# Patient Record
Sex: Male | Born: 1955 | Race: White | Hispanic: No | Marital: Married | Smoking: Never smoker
Health system: Southern US, Community
[De-identification: ages and names within clinical notes are randomized; demographics above are authoritative.]

## PROBLEM LIST (undated history)

## (undated) DIAGNOSIS — G4730 Sleep apnea, unspecified: Secondary | ICD-10-CM

## (undated) DIAGNOSIS — E785 Hyperlipidemia, unspecified: Secondary | ICD-10-CM

## (undated) DIAGNOSIS — I482 Chronic atrial fibrillation, unspecified: Secondary | ICD-10-CM

## (undated) DIAGNOSIS — G43909 Migraine, unspecified, not intractable, without status migrainosus: Secondary | ICD-10-CM

## (undated) DIAGNOSIS — Z86711 Personal history of pulmonary embolism: Secondary | ICD-10-CM

## (undated) DIAGNOSIS — F329 Major depressive disorder, single episode, unspecified: Secondary | ICD-10-CM

## (undated) DIAGNOSIS — F32A Depression, unspecified: Secondary | ICD-10-CM

## (undated) DIAGNOSIS — Z87442 Personal history of urinary calculi: Secondary | ICD-10-CM

## (undated) DIAGNOSIS — E669 Obesity, unspecified: Secondary | ICD-10-CM

## (undated) DIAGNOSIS — I10 Essential (primary) hypertension: Secondary | ICD-10-CM

## (undated) DIAGNOSIS — I5022 Chronic systolic (congestive) heart failure: Secondary | ICD-10-CM

## (undated) DIAGNOSIS — M1990 Unspecified osteoarthritis, unspecified site: Secondary | ICD-10-CM

## (undated) DIAGNOSIS — T7840XA Allergy, unspecified, initial encounter: Secondary | ICD-10-CM

## (undated) DIAGNOSIS — G4733 Obstructive sleep apnea (adult) (pediatric): Secondary | ICD-10-CM

## (undated) DIAGNOSIS — A770 Spotted fever due to Rickettsia rickettsii: Secondary | ICD-10-CM

## (undated) HISTORY — DX: Spotted fever due to Rickettsia rickettsii: A77.0

## (undated) HISTORY — DX: Essential (primary) hypertension: I10

## (undated) HISTORY — DX: Unspecified osteoarthritis, unspecified site: M19.90

## (undated) HISTORY — DX: Hyperlipidemia, unspecified: E78.5

## (undated) HISTORY — DX: Hemochromatosis, unspecified: E83.119

## (undated) HISTORY — DX: Allergy, unspecified, initial encounter: T78.40XA

## (undated) HISTORY — DX: Sleep apnea, unspecified: G47.30

## (undated) HISTORY — DX: Obstructive sleep apnea (adult) (pediatric): G47.33

## (undated) HISTORY — DX: Major depressive disorder, single episode, unspecified: F32.9

## (undated) HISTORY — PX: KNEE SURGERY: SHX244

## (undated) HISTORY — DX: Depression, unspecified: F32.A

## (undated) HISTORY — DX: Chronic systolic (congestive) heart failure: I50.22

## (undated) HISTORY — PX: URETEROSCOPY: SHX842

## (undated) HISTORY — DX: Migraine, unspecified, not intractable, without status migrainosus: G43.909

## (undated) HISTORY — DX: Obesity, unspecified: E66.9

## (undated) HISTORY — DX: Chronic atrial fibrillation, unspecified: I48.20

## (undated) HISTORY — DX: Personal history of pulmonary embolism: Z86.711

## (undated) HISTORY — DX: Personal history of urinary calculi: Z87.442

---

## 1999-04-02 ENCOUNTER — Emergency Department (HOSPITAL_COMMUNITY): Admission: EM | Admit: 1999-04-02 | Discharge: 1999-04-02 | Payer: Self-pay | Admitting: Emergency Medicine

## 1999-04-02 ENCOUNTER — Encounter: Payer: Self-pay | Admitting: Emergency Medicine

## 2007-04-11 ENCOUNTER — Emergency Department: Payer: Self-pay | Admitting: Emergency Medicine

## 2007-04-16 ENCOUNTER — Emergency Department: Payer: Self-pay | Admitting: Emergency Medicine

## 2007-08-09 ENCOUNTER — Ambulatory Visit: Payer: Self-pay | Admitting: Urology

## 2007-08-20 ENCOUNTER — Ambulatory Visit: Payer: Self-pay | Admitting: Urology

## 2007-08-22 ENCOUNTER — Ambulatory Visit: Payer: Self-pay | Admitting: Urology

## 2009-06-06 DIAGNOSIS — A770 Spotted fever due to Rickettsia rickettsii: Secondary | ICD-10-CM

## 2009-06-06 HISTORY — DX: Spotted fever due to Rickettsia rickettsii: A77.0

## 2009-10-03 ENCOUNTER — Observation Stay: Payer: Self-pay | Admitting: Specialist

## 2010-06-06 HISTORY — PX: CARDIAC CATHETERIZATION: SHX172

## 2010-10-12 ENCOUNTER — Inpatient Hospital Stay: Payer: Self-pay | Admitting: Internal Medicine

## 2010-12-30 ENCOUNTER — Emergency Department: Payer: Self-pay | Admitting: Emergency Medicine

## 2011-01-11 ENCOUNTER — Emergency Department: Payer: Self-pay | Admitting: Emergency Medicine

## 2011-03-02 ENCOUNTER — Emergency Department: Payer: Self-pay | Admitting: Unknown Physician Specialty

## 2011-06-09 ENCOUNTER — Emergency Department: Payer: Self-pay | Admitting: Emergency Medicine

## 2014-06-06 HISTORY — PX: URETERAL STENT PLACEMENT: SHX822

## 2014-07-24 ENCOUNTER — Emergency Department: Payer: Self-pay | Admitting: Emergency Medicine

## 2014-07-26 ENCOUNTER — Emergency Department: Payer: Self-pay | Admitting: Emergency Medicine

## 2014-07-30 ENCOUNTER — Ambulatory Visit: Payer: Self-pay | Admitting: Urology

## 2014-07-31 ENCOUNTER — Ambulatory Visit: Payer: Self-pay | Admitting: Urology

## 2014-07-31 HISTORY — PX: LITHOTRIPSY: SUR834

## 2014-08-21 DIAGNOSIS — I10 Essential (primary) hypertension: Secondary | ICD-10-CM | POA: Diagnosis not present

## 2014-08-28 ENCOUNTER — Ambulatory Visit: Payer: Self-pay | Admitting: Internal Medicine

## 2014-08-28 LAB — PROTIME-INR
INR: 1.1
Prothrombin Time: 14.1 secs

## 2014-08-29 ENCOUNTER — Other Ambulatory Visit: Payer: Self-pay | Admitting: Physician Assistant

## 2014-08-29 ENCOUNTER — Telehealth: Payer: Self-pay

## 2014-08-29 DIAGNOSIS — I482 Chronic atrial fibrillation: Secondary | ICD-10-CM | POA: Diagnosis not present

## 2014-08-29 DIAGNOSIS — I5022 Chronic systolic (congestive) heart failure: Secondary | ICD-10-CM | POA: Diagnosis not present

## 2014-08-29 LAB — URINALYSIS, COMPLETE
Bacteria: NONE SEEN
Bilirubin,UR: NEGATIVE
Blood: NEGATIVE
Glucose,UR: NEGATIVE mg/dL (ref 0–75)
Ketone: NEGATIVE
Leukocyte Esterase: NEGATIVE
Nitrite: NEGATIVE
Ph: 5 (ref 4.5–8.0)
Protein: NEGATIVE
RBC,UR: NONE SEEN /HPF (ref 0–5)
Specific Gravity: 1.011 (ref 1.003–1.030)
Squamous Epithelial: NONE SEEN
WBC UR: 2 /HPF (ref 0–5)

## 2014-08-29 LAB — CBC WITH DIFFERENTIAL/PLATELET
Basophil #: 0 10*3/uL (ref 0.0–0.1)
Basophil %: 0.6 %
Eosinophil #: 0.3 10*3/uL (ref 0.0–0.7)
Eosinophil %: 4.6 %
HCT: 44.1 % (ref 40.0–52.0)
HGB: 14.4 g/dL (ref 13.0–18.0)
Lymphocyte #: 2.5 10*3/uL (ref 1.0–3.6)
Lymphocyte %: 32.9 %
MCH: 32.5 pg (ref 26.0–34.0)
MCHC: 32.6 g/dL (ref 32.0–36.0)
MCV: 100 fL (ref 80–100)
Monocyte #: 0.6 x10 3/mm (ref 0.2–1.0)
Monocyte %: 8.3 %
Neutrophil #: 4 10*3/uL (ref 1.4–6.5)
Neutrophil %: 53.6 %
Platelet: 166 10*3/uL (ref 150–440)
RBC: 4.43 10*6/uL (ref 4.40–5.90)
RDW: 13 % (ref 11.5–14.5)
WBC: 7.4 10*3/uL (ref 3.8–10.6)

## 2014-08-29 LAB — BASIC METABOLIC PANEL
Anion Gap: 7 (ref 7–16)
BUN: 27 mg/dL — ABNORMAL HIGH
Calcium, Total: 8.8 mg/dL — ABNORMAL LOW
Chloride: 106 mmol/L
Co2: 27 mmol/L
Creatinine: 1.08 mg/dL
EGFR (African American): >60
EGFR (Non-African Amer.): >60
Glucose: 91 mg/dL
Potassium: 4.4 mmol/L
Sodium: 140 mmol/L

## 2014-08-29 NOTE — Telephone Encounter (Signed)
Patient contacted regarding discharge from [facility] on 08/29/14.  Patient understands to follow up with Dr. Rockey Situ on 09/12/14 at 8:15 at [facility]. Patient understands discharge instructions? yes Patient understands medications and regiment? yes Patient understands to bring all medications to this visit? yes

## 2014-09-03 ENCOUNTER — Telehealth: Payer: Self-pay

## 2014-09-03 NOTE — Telephone Encounter (Signed)
Pt states he was seen by Dr. Darnell Level in the hosp, states he went into afib during a procedure, for his kidney stone, and pt states that Dr. Darnell Level told him he would be present for his next procedure, and it is scheduled for Tuesday, 4/5. Also has a question regarding his Metoprolol.  Please call and advise. Pt has TCM appt on 4/8.  Also next call, was pt urologist, Dr. Yves Dill, letting us know the procedure is scheduled for 4/5 at 1:00. Their contact # [phone]

## 2014-09-03 NOTE — Telephone Encounter (Signed)
We need accurate list of his medications Also patient needs to give [place] his blood pressure and heart rate daily weekend, early next week so we can adjust medications  Uncertain if I can be there at that time, I am covering office as well as hospital  would like to premedicate him to avoid any problems prior to the procedure

## 2014-09-04 MED ORDER — PRAVASTATIN SODIUM 80 MG PO TABS: 80.0000 mg | ORAL_TABLET | Freq: Every evening | ORAL | Status: DC

## 2014-09-04 MED ORDER — CENTRUM SILVER ADULT 50+ PO TABS: 1.0000 | ORAL_TABLET | Freq: Every day | ORAL | Status: AC

## 2014-09-04 MED ORDER — TRAMADOL HCL 50 MG PO TABS: 50.0000 mg | ORAL_TABLET | Freq: Four times a day (QID) | ORAL | Status: DC | PRN

## 2014-09-04 MED ORDER — APIXABAN 5 MG PO TABS: 5.0000 mg | ORAL_TABLET | Freq: Two times a day (BID) | ORAL | Status: DC

## 2014-09-04 MED ORDER — METOPROLOL TARTRATE 100 MG PO TABS: 100.0000 mg | ORAL_TABLET | Freq: Two times a day (BID) | ORAL | Status: DC

## 2014-09-04 MED ORDER — VITAMIN C 500 MG PO TABS: 500.0000 mg | ORAL_TABLET | Freq: Every day | ORAL | Status: DC

## 2014-09-04 NOTE — Telephone Encounter (Signed)
Pt need clarification on medication. He is not sure the dosage he is to take. May have to rsch his kidney procedure.  Please call.

## 2014-09-04 NOTE — Telephone Encounter (Signed)
Spoke w/ pt.  Advised him of Dr. Donivan Scull recommendation.  Reviewed pt's med list w/ him and made adjustments according to his list.  Pt states repeatedly that he went over all of this w/ Thurmond Butts in the hospital.  Advised him that he has not been seen in our office and we need to reconcile his med list. Advised pt that Dr. Rockey Situ may not be present for the actual procedure, but would make adjustments to his meds. He states that Dr. Rockey Situ promised him that he would be there throughout the procedure and that Dr. Yves Dill was supposed to call and discuss this with him.  Pt states "I'm just going to cancel everything, I don't care if my kidney is ok or not.  I'll find a doctor that has time for me." and hung up on me.   Called Dr. Letta Kocher office and advised them of conversation w/ pt.  She states that she had expressed concern to pt, as she had never heard of a cardiologist being present for a procedure, but pt insisted to her that Dr. Rockey Situ and told him that. She is appreciative of the call and will make Dr. Yves Dill aware.  Gave her my direct # and asked her or Dr. Yves Dill to call back w/ any questions or concerns.

## 2014-09-04 NOTE — Telephone Encounter (Signed)
Pt called, and states he has stopped taking all his heart medication, because "no one can tell him what to take". And has cancelled his kidney procedure. Please call.

## 2014-09-04 NOTE — Telephone Encounter (Signed)
Stopped warfin and started eliquis his question is on: Metoprolol was on 100 mg twice a day and when he left hospital they sent a to his mail order pharmacy 50 mg 3 times a day, but in hospital they told him they would increase but they actually lowered it.  This is one problem  Spoke to dr Rockey Situ when he was in hospital he told him he would actually add the 50 to the 100. Since then he is still doing the 100, for it was sent to his mail order.  Also he may need a written rx to take to the local pharmacy.  Lasiks , he is not taking anymore and when he went to the hosptial 40 mg day used to take 2x a day He stated he will continue taking his normal dosage until he hears from [place]  [facility] in [place]   120/70 HR 79 about (1130 am this morning) 110/80 HR 69 About ( 3 pm today)

## 2014-09-05 MED ORDER — ALPRAZOLAM 0.5 MG PO TABS: 0.5000 mg | ORAL_TABLET | Freq: Once | ORAL | Status: DC | PRN

## 2014-09-05 NOTE — Telephone Encounter (Signed)
Would stay on metoprolol 100 mg twice a day The morning of procedure, Tuesday morning, take extra metoprolol 50 mg, Xanax 0.5 mg 1  If he has tachycardia in the preop area, take additional Xanax 0.5 mg 1 Would bring extra metoprolol with him if needed postop May need additional Xanax after the procedure if tachycardic with extra half dose metoprolol  Would let Dr. Eliberto Ivory know plan to make sure that preprocedure consent is signed ahead of time Patient can also take the Xanax after he arrives, after signing consent Metoprolol would take 150 mg in the morning when he gets up

## 2014-09-05 NOTE — Telephone Encounter (Signed)
Spoke w/ pt.  Advised him of Dr. Donivan Scull recommendation.  Pt verbalizes understanding and is agreeable.  He is appreciative of the call and will keep his appt next week.

## 2014-09-09 ENCOUNTER — Ambulatory Visit
Admit: 2014-09-09 | Disposition: A | Discharge status: Home / Self Care | Payer: Self-pay | Attending: Urology | Admitting: Urology

## 2014-09-12 ENCOUNTER — Encounter: Payer: Self-pay | Admitting: Cardiovascular Disease

## 2014-09-17 ENCOUNTER — Telehealth: Payer: Self-pay

## 2014-09-17 NOTE — Telephone Encounter (Signed)
Pt states he needs another kidney procedure and needs to talk with a nurse about his heart. Please call.

## 2014-09-17 NOTE — Telephone Encounter (Signed)
Pt has not been seen in our office yet.  He will need to keep his appt w/ Dr. Rockey Situ to discuss.

## 2014-09-19 NOTE — Telephone Encounter (Signed)
This encounter was created in error - please disregard.

## 2014-09-25 ENCOUNTER — Encounter: Payer: Self-pay | Admitting: Cardiovascular Disease

## 2014-09-26 ENCOUNTER — Encounter: Payer: Self-pay | Admitting: Cardiovascular Disease

## 2014-09-26 ENCOUNTER — Ambulatory Visit (INDEPENDENT_AMBULATORY_CARE_PROVIDER_SITE_OTHER): Payer: Medicare HMO | Admitting: Cardiovascular Disease

## 2014-09-26 VITALS — BP 100/80 | HR 105 | Ht 72.0 in | Wt 307.5 lb

## 2014-09-26 DIAGNOSIS — M170 Bilateral primary osteoarthritis of knee: Secondary | ICD-10-CM

## 2014-09-26 DIAGNOSIS — Z9989 Dependence on other enabling machines and devices: Secondary | ICD-10-CM

## 2014-09-26 DIAGNOSIS — F419 Anxiety disorder, unspecified: Secondary | ICD-10-CM | POA: Diagnosis not present

## 2014-09-26 DIAGNOSIS — R609 Edema, unspecified: Secondary | ICD-10-CM

## 2014-09-26 DIAGNOSIS — G4733 Obstructive sleep apnea (adult) (pediatric): Secondary | ICD-10-CM

## 2014-09-26 DIAGNOSIS — I4891 Unspecified atrial fibrillation: Secondary | ICD-10-CM | POA: Diagnosis not present

## 2014-09-26 DIAGNOSIS — N200 Calculus of kidney: Secondary | ICD-10-CM

## 2014-09-26 DIAGNOSIS — E785 Hyperlipidemia, unspecified: Secondary | ICD-10-CM | POA: Insufficient documentation

## 2014-09-26 DIAGNOSIS — I5032 Chronic diastolic (congestive) heart failure: Secondary | ICD-10-CM

## 2014-09-26 DIAGNOSIS — I10 Essential (primary) hypertension: Secondary | ICD-10-CM

## 2014-09-26 HISTORY — DX: Edema, unspecified: R60.9

## 2014-09-26 MED ORDER — ALPRAZOLAM 0.5 MG PO TABS: 0.5000 mg | ORAL_TABLET | Freq: Every evening | ORAL | Status: DC | PRN

## 2014-09-26 MED ORDER — APIXABAN 5 MG PO TABS: 5.0000 mg | ORAL_TABLET | Freq: Two times a day (BID) | ORAL | Status: DC

## 2014-09-26 NOTE — Assessment & Plan Note (Signed)
Chronic atrial fibrillation, rate borderline elevated Suggested he could try metoprolol succinate 125 mg twice a day For his procedure, lithotripsy, would take extra 50 mg the night before and morning of in addition to his Xanax the night before and morning of

## 2014-09-26 NOTE — Patient Instructions (Addendum)
You are doing well. Consider trying metoprolol 125 mg twice a day  For the procedure: Take xanax the night before and morning of the test (also one hour before the test if needed) Take metoprolol 150 mg the night before and morning of the procedure  Please call us if you have new issues that need to be addressed before your next appt.  Your physician wants you to follow-up in: 6 months.  You will receive a reminder letter in the mail two months in advance. If you don't receive a letter, please call our office to schedule the follow-up appointment.

## 2014-09-26 NOTE — Assessment & Plan Note (Signed)
Recommended that he stay on his pravastatin

## 2014-09-26 NOTE — Assessment & Plan Note (Signed)
I suspect his anxiety is playing a major role in his tachycardia before procedures. We'll continue to use Xanax prior to procedures

## 2014-09-26 NOTE — Progress Notes (Signed)
Carlos Crosby ID: Carlos Crosby, male    DOB: Sep 20, 1955, 59 y.o.   MRN: [id number]  HPI Comments: [name] is a 59 year old gentleman with morbid obesity, chronic atrial fibrillation, chronic systolic CHF with ejection fraction 25% in 2012, most recently 55% in September 2013, also history of obstructive sleep apnea on CPAP, hypertension, hyperlipidemia, recurrent renal stones, osteoarthritis of the knees bilaterally, depression who underwent lithotripsy on every 25th 2016 and presented for an outpatient left lithotripsy 08/28/2014 but was found to have atrial fibrillation with RVR with heart rates up to 170s prior to the procedure and this was canceled.  He was kept overnight in the hospital and heart rate returned to normal on his regular medication regiment, metoprolol succinate 100 mg twice a day He reports that he does not tolerate digoxin He was changed from warfarin to eliquis 5 g twice a day.  As an outpatient, he went repeat lithotripsy with Dr. Eliberto Ivory several weeks ago and was given Xanax the night before, morning of the procedure and extra metoprolol succinate 50 mg the night before is the morning of the procedure. He reports that his heart rate was well-controlled, Dr. Eliberto Ivory attempted the lithotripsy but was unsuccessful Lithotripsy will again be rescheduled for possibly next week on a Thursday, April 28 He currently has a stent in the left urethra  He reports that he feels well with no significant shortness of breath with exertion. Knees bother him chronically He takes Lasix 40 mg daily  EKG on today's visit shows atrial fibrillation with rate 10 5 bpm, left axis deviation   Allergies  Allergen Reactions  . Allopurinol Hives and Swelling  . Other Hives  . Penicillin G Nausea Only    And dirrhea  . Penicillins Other (See Comments)    Pt unsure of rxn; showed up on allergy test  . Ondansetron Rash    Outpatient Encounter Prescriptions as of 09/26/2014  Medication Sig  .  apixaban (ELIQUIS) 5 MG TABS tablet Take 1 tablet (5 mg total) by mouth 2 (two) times daily.  . furosemide (LASIX) 40 MG tablet Take 40 mg by mouth daily.  Marland Kitchen lisinopril (PRINIVIL,ZESTRIL) 5 MG tablet Take 5 mg by mouth daily.  . Meth-Hyo-M Bl-Na Phos-Ph Sal (URIBEL) 118 MG CAPS Take 118 mg by mouth 4 (four) times daily as needed.  . metoprolol (LOPRESSOR) 100 MG tablet Take 1 tablet (100 mg total) by mouth 2 (two) times daily.  . Multiple Vitamins-Minerals (CENTRUM SILVER ADULT 50+) TABS Take 1 tablet by mouth daily.  Marland Kitchen oxycodone (OXY-IR) 5 MG capsule Take 5 mg by mouth 3 (three) times daily as needed.  . pravastatin (PRAVACHOL) 80 MG tablet Take 1 tablet (80 mg total) by mouth every evening.  . traMADol (ULTRAM) 50 MG tablet Take 1 tablet (50 mg total) by mouth every 6 (six) hours as needed.  . vitamin C (ASCORBIC ACID) 500 MG tablet Take 1 tablet (500 mg total) by mouth daily.  . [DISCONTINUED] apixaban (ELIQUIS) 5 MG TABS tablet Take 1 tablet (5 mg total) by mouth 2 (two) times daily.  Marland Kitchen ALPRAZolam (XANAX) 0.5 MG tablet Take 1 tablet (0.5 mg total) by mouth at bedtime as needed for anxiety.  . [DISCONTINUED] ALPRAZolam (XANAX) 0.5 MG tablet Take 1 tablet (0.5 mg total) by mouth once as needed for anxiety (Please take 1 pill the morning of your procedure.Take 1 pill prn before procedure for anxiey). (Carlos Crosby not taking: Reported on 09/26/2014)    Past Medical History  Diagnosis Date  . Chronic a-fib   . Obesity   . OSA (obstructive sleep apnea)     on CPAP  . Chronic systolic CHF (congestive heart failure)   . Hypertension   . Hyperlipidemia   . History of kidney stones   . Osteoarthritis     bilateral knees  . Depression   . History of pulmonary embolism   . [place] spotted fever 2011    Past Surgical History  Procedure Laterality Date  . Lithotripsy  07/31/2014  . Knee surgery      right knee   . Ureteroscopy    . Ureteral stent placement  2016  . Cardiac  catheterization  2012    [facility]    Social History  reports that he has never smoked. He does not have any smokeless tobacco history on file. He reports that he does not drink alcohol or use illicit drugs.  Family History Family history is unknown by Carlos Crosby.   Review of Systems  Constitutional: Negative.   Respiratory: Negative.   Cardiovascular: Negative.   Gastrointestinal: Negative.   Musculoskeletal: Negative.   Skin: Negative.   Neurological: Negative.   Hematological: Negative.   Psychiatric/Behavioral: Negative.   All other systems reviewed and are negative.   BP 100/80 mmHg  Pulse 105  Ht 6' (1.829 m)  Wt 307 lb 8 oz (139.481 kg)  BMI 41.70 kg/m2  Physical Exam  Constitutional: He is oriented to person, place, and time. He appears well-developed and well-nourished.  Obese  HENT:  Head: Normocephalic.  Nose: Nose normal.  Mouth/Throat: Oropharynx is clear and moist.  Eyes: Conjunctivae are normal. Pupils are equal, round, and reactive to light.  Neck: Normal range of motion. Neck supple. No JVD present.  Cardiovascular: Normal rate, S1 normal, S2 normal, normal heart sounds and intact distal pulses.  An irregularly irregular rhythm present. Exam reveals no gallop and no friction rub.   No murmur heard. Pulmonary/Chest: Effort normal and breath sounds normal. No respiratory distress. He has no wheezes. He has no rales. He exhibits no tenderness.  Abdominal: Soft. Bowel sounds are normal. He exhibits no distension. There is no tenderness.  Musculoskeletal: Normal range of motion. He exhibits no edema or tenderness.  Lymphadenopathy:    He has no cervical adenopathy.  Neurological: He is alert and oriented to person, place, and time. Coordination normal.  Skin: Skin is warm and dry. No rash noted. No erythema.  Psychiatric: He has a normal mood and affect. His behavior is normal. Judgment and thought content normal.      Assessment and Plan   Nursing note and  vitals reviewed.

## 2014-09-26 NOTE — Assessment & Plan Note (Signed)
Scheduled for lithotripsy next week Regimen as above

## 2014-09-26 NOTE — Assessment & Plan Note (Signed)
He appears relatively euvolemic. Recommended he stay on his Lasix daily

## 2014-09-26 NOTE — Assessment & Plan Note (Signed)
Blood pressure is well controlled on today's visit. Medication changes as above for heart rate control

## 2014-09-26 NOTE — Assessment & Plan Note (Signed)
He reports that he is compliant on his Cpap

## 2014-09-26 NOTE — Assessment & Plan Note (Signed)
We have encouraged continued exercise, careful diet management in an effort to lose weight. He reports having 4 pound weight loss each month

## 2014-10-01 ENCOUNTER — Ambulatory Visit
Admit: 2014-10-01 | Disposition: A | Discharge status: Home / Self Care | Payer: Self-pay | Attending: Urology | Admitting: Urology

## 2014-10-01 ENCOUNTER — Encounter: Payer: Self-pay | Admitting: Cardiovascular Disease

## 2014-10-01 LAB — BASIC METABOLIC PANEL
Anion Gap: 11 (ref 7–16)
BUN: 28 mg/dL — ABNORMAL HIGH
Calcium, Total: 9.5 mg/dL
Chloride: 108 mmol/L
Co2: 22 mmol/L
Creatinine: 1.1 mg/dL
EGFR (African American): >60
EGFR (Non-African Amer.): >60
Glucose: 112 mg/dL — ABNORMAL HIGH
Potassium: 4.2 mmol/L
Sodium: 141 mmol/L

## 2014-10-01 LAB — PROTIME-INR
INR: 1
Prothrombin Time: 12.9 secs

## 2014-10-02 ENCOUNTER — Ambulatory Visit
Admit: 2014-10-02 | Disposition: A | Discharge status: Home / Self Care | Payer: Self-pay | Attending: Urology | Admitting: Urology

## 2014-10-03 ENCOUNTER — Encounter: Payer: Self-pay | Admitting: Cardiovascular Disease

## 2014-10-05 NOTE — H&P (Signed)
PATIENT NAME:  Carlos Crosby, Carlos Crosby MR#:  [id number] DATE OF BIRTH:  05-04-1956  DATE OF ADMISSION:  08/28/2014  ADDENDUM  I did the consult for this patient earlier today, and I am going to admit this patient for atrial fibrillation with RVR with some hypotension.. The patient's heart rate has been around 120s and blood pressure around 90s.   ASSESSMENT AND PLAN: The patient will be admitted under telemetry for atrial fibrillation with rapid ventricular response and borderline hypertension. The patient will get IV fluids, and we are going to restart his metoprolol and also start him on Lovenox full dose, and obtain cardiology consult. The rest of his physical hospital course are the same; please change my consult to a history and physical.   TIME SPENT: More than 30 minutes.   The patient will be seen by Dr. Rockey Situ. The patient is agreeable to stay. Continue Lovenox in case if he gets stabilized with heart rate, he may be seen by Dr. Yves Dill for possible stent placement for his left ureteral stone.   Please change my consult to a history and physical.   ____________________________ Epifanio Lesches, MD [id number] D: 08/28/2014 18:31:51 ET T: 08/28/2014 20:57:22 ET JOB#: [id number]  cc: Epifanio Lesches, MD, <Dictator> Epifanio Lesches MD ELECTRONICALLY SIGNED 09/07/2014 15:38

## 2014-10-05 NOTE — H&P (Signed)
PATIENT NAME:  Carlos Crosby, Carlos Crosby MR#:  [id number] DATE OF BIRTH:  09-Dec-1955  SAME DAY SURGERY: 08/28/2014    CHIEF COMPLAINT: Kidney stone.   HISTORY OF PRESENT ILLNESS: Carlos Crosby is a 60 year old white male with a large left renal stone, underwent lithotripsy February 25th. He was followed up in the office March 23rd and had an obstructing large fragment in the mid left ureter. He comes in now for left retrograde ureteroscopy with holmium laser lithotripsy and stent placement.   PAST MEDICAL HISTORY:   ALLERGIES: ALLOPURINOL, ZOFRAN AND PENICILLIN.   CURRENT MEDICATIONS: Include Lasix lisinopril, metoprolol, pravastatin, tamsulosin, tramadol, Coumadin, oxycodone and Nucynta.   PREVIOUS SURGICAL PROCEDURES: Included left percutaneous nephrolithotomy 2009 and knee surgery.   SOCIAL HISTORY: The patient denied tobacco or alcohol use.   FAMILY HISTORY: Negative for urological disease.   PAST AND CURRENT MEDICAL CONDITIONS:  1. Atrial fibrillation.  2. Arthritis.  3. Hypertension.  4. Hypercholesterolemia.  5. Recurrent calcium oxalate stone disease.   REVIEW OF SYSTEMS: The patient denies chest pain, shortness of breath, diabetes, stroke or coronary artery disease.   PHYSICAL EXAMINATION: GENERAL: Obese white male, in no acute distress.    HEENT: Sclerae were clear. Pupils were equally round, reactive to light and accommodation. Extraocular movements were intact.  NECK: Supple. No palpable cervical adenopathy.  LUNGS: Clear to auscultation.  CARDIOVASCULAR: Regular rhythm and rate, without audible murmurs.  ABDOMEN: Soft, nontender.  GENITOURINARY AND RECTAL: Deferred.  NEUROMUSCULAR: Alert and oriented x 3.   IMPRESSION: Left ureterolithiasis with high-grade obstruction.   PLAN: Cystoscopy with left retrograde, left ureteroscopy with holmium laser lithotripsy and stent placement.     ____________________________ Otelia Limes. Yves Dill, MD [email or web address] D: 08/27/2014 16:47:00  ET T: 08/27/2014 18:28:38 ET JOB#: [id number]  cc: Otelia Limes. Yves Dill, MD, <Dictator> Royston Cowper MD ELECTRONICALLY SIGNED 08/28/2014 13:55

## 2014-10-05 NOTE — Op Note (Signed)
PATIENT NAME:  Carlos Crosby, Carlos Crosby MR#:  [id number] DATE OF BIRTH:  December 22, 1955  DATE OF PROCEDURE:  09/09/2014  PREOPERATIVE DIAGNOSIS: Left ureterolithiasis.   POSTOPERATIVE DIAGNOSES:  1. Left ureterolithiasis.  2. Left nephrolithiasis.  3. Hydronephrosis.   PROCEDURES:  1. Left ureteroscopic ureterolithotomy with holmium laser lithotripsy.  2. Left double pigtail stent placement.  3. Left retrograde pyelogram.  4. Fluoroscopy.   SURGEON: Maryan Puls, MD.   ANESTHETIST: Dr. Boston Service.   ANESTHETIC METHOD: General per Dr. Boston Service.   INDICATIONS: See the dictated history and physical. After informed consent the patient requested the above procedure.   OPERATIVE SUMMARY: After adequate general anesthesia had been obtained the patient was placed into dorsal lithotomy position and the perineum was prepped and draped in the usual fashion. The 21 French cystoscope was coupled with the camera and then visually advanced into the bladder. The bladder was thoroughly inspected. No bladder lesions were identified. Both ureteral orifices were identified and had clear efflux. At this point an 42 [place] cone-tipped ureteral catheter was advanced into the left orifice and retrograde pyelograms were performed. Both fluoroscopic and static images were taken. The patient had an 8-10 mm stone in the region of the left mid ureter with proximal dilatation of the ureter. The patient also had a 10 mm stone present in the region of the left renal shadow. The kidney stone appeared to be localized in the left renal pelvis. At this point a 0.035 Glidewire was advanced into the left orifice and curled within the renal pelvis. There was some resistance at the site of the mid ureteral stone. The intramural ureter was then dilated to 7 mm with a balloon dilating catheter. The balloon dilating catheter was then removed, taking care to leave the stent in position. A ureteral access sheath was then advanced over the  guidewire and positioned in the distal ureter. The flexible ureteroscope was then advanced into the access sheath up to the point of the stone. The 386 micron holmium laser fiber was then advanced through the scope and the stone was fully fragmented. At this point the scope was easily passed into the renal pelvis. The ureteroscope was then removed and a 0.035 Glidewire was then placed back into the ureter. The cystoscope was back-loaded over the guidewire and a 6 x 26 cm double pigtail stent with suture attached distally advanced over the guidewire into the ureter. Guidewire was then removed, taking care to leave the stent in position. The cystoscope was then removed. 10 mL of viscous Xylocaine was instilled within the urethra and the bladder. A B and O suppository was placed. The procedure was then terminated and the patient was transferred to the recovery room in stable condition.    ____________________________ Otelia Limes. Yves Dill, MD [email or web address] D: 09/09/2014 14:38:59 ET T: 09/09/2014 14:54:46 ET JOB#: [id number]  cc: Otelia Limes. Yves Dill, MD, <Dictator> Royston Cowper MD ELECTRONICALLY SIGNED 09/10/2014 9:19

## 2014-10-05 NOTE — Discharge Summary (Signed)
PATIENT NAME:  Carlos Crosby, Carlos Crosby MR#:  [id number] DATE OF BIRTH:  [birth year]  DATE OF ADMISSION:  08/28/2014 DATE OF DISCHARGE:  08/29/2014  ADMITTING DIAGNOSES: Atrial fibrillation, rapid ventricular response.   DISCHARGE DIAGNOSES:   1.  Atrial fibrillation, rapid ventricular response.  2.  Hypotension due to a. fibrillation, resolved.  3.  Left ureteral stone.  4.  History of obstructive sleep apnea, kidney stones, obesity.   DISCHARGE CONDITION: Stable.   DISCHARGE MEDICATIONS: The patient is to continue Pravastatin 80 mg once daily, tramadol 50 mg daily as needed, naproxen 500 mg twice daily as needed, Centrum Silver once daily, vitamin C 500 mg daily, furosemide 40 mg p.o. half tablet once daily, potassium gluconate 1 tablet once daily, Tapentadol 50 mg every 4 to 6 hours as needed, tamsulosin 0.4 mg daily, promethazine 25 mg rectal suppository every 4 to 6 hours as needed, levofloxacin 500 mg p.o. once daily for prior recommended course. Metoprolol 50 mg tablets 3 tablets twice daily, this is a new dose. Eliquis 5 mg twice daily, this is new medication.   HOME OXYGEN: None.   DIET:  2 gram gram salt, low fat, low cholesterol, regular consistency.   ACTIVITY LIMITATIONS: As tolerated.    FOLLOWUP APPOINTMENT: With Dr. Rockey Situ in 1 week after discharge, Dr. Yves Dill, urology, in 1 week after discharge, and Dr. Petra Kuba in 2 days after discharge.   CONSULTANTS: Care management, social work, Dr. Rockey Situ, Mr. Christell Faith.   RADIOLOGIC STUDIES: None.   HOSPITAL COURSE: The patient is a 59 year old Caucasian male with history of atrial fibrillation who came for urologic procedure on the 08/28/2014, was sent to admission because of atrial fibrillation, RVR, when his heart rate was noted to be from 160s to 170s and he was noted to be hypotensive. Please refer to Dr. Governor Specking admission note on the 08/28/2014. On arrival to the hospital, the patient's vital signs: Temperature was 98.6, pulse was 160,  blood pressure 100/70, O2 saturations were 100% on 2 liters of oxygen per nasal cannula at rest. The patient's physical exam was remarkable for irregularly irregular, tachycardic rate and rhythm. Otherwise, physical exam was unremarkable. The patient's lab data done on arrival to the hospital showed mild elevation of BUN of 27, otherwise, unremarkable BMP. Liver enzymes were not checked. Cardiac enzymes were not checked. The patient's CBC was within normal limits with white blood cell count 7.4, hemoglobin 14.4, platelet count 166,000. Coagulation panel was unremarkable. Urinalysis revealed 2 white blood cells, no bacteria, no red blood cells, negative for nitrites or leukocyte esterase.  The patient was admitted to the hospital for further evaluation.   He was initiated on metoprolol higher doses and was given some IV fluids. With this, his condition improved. He was seen by cardiologist, Dr. Rockey Situ, as well as Mr. Christell Faith. Cardiologist recommended to advance his metoprolol to higher doses, also initiate patient on Eliquis for time being until decision is made to get urologic procedure done. The patient was started on advanced doses of metoprolol as well as Eliquis and was felt to be stable to be discharged home today, 08/29/2014.   On the day of discharge, temperature was 98.4, pulse was ranging from 77 to 103, respiration rate was 18 to 20, blood pressure 135/94, O2 saturations were 99% on 100% on room air at rest. The patient was advised to continue metoprolol as well as Eliquis and prescriptions were given for him. The patient is to follow up with Dr. Rockey Situ as well as  Dr. Petra Kuba for recommendations in regards to management of his atrial fibrillation. Coumadin was suspended to be restarted depending on patient's decision about continuation of Eliquis.    In regards to hypotension, the patient's low blood pressure resolved with improvement in atrial fibrillation rate with therapy. For ureteral stone,  patient is to follow up with Dr. Yves Dill for further recommendations. The patient is to discontinue Eliquis a few days prior to urologic evaluation,  procedure. For obstructive sleep apnea, the patient is to continue CPAP. The patient is being discharged in stable condition with the above-mentioned medications and followup.   TIME SPENT: 40 minutes.    ____________________________ Theodoro Grist, MD rv:AT D: 08/29/2014 17:55:45 ET T: 08/30/2014 03:28:27 ET JOB#: [id number]  cc: Theodoro Grist, MD, <Dictator> Hall Busing. Petra Kuba, MD Minna Merritts, MD Otelia Limes. Yves Dill, MD  Theodoro Grist MD ELECTRONICALLY SIGNED 09/11/2014 17:09

## 2014-10-05 NOTE — Consult Note (Signed)
PATIENT NAME:  Carlos Crosby, Carlos Crosby MR#:  [id number] DATE OF BIRTH:  Mar 24, 1956  DATE OF CONSULTATION:  08/28/2014  REFERRING PHYSICIAN:   CONSULTING PHYSICIAN:  Epifanio Lesches, MD  REQUESTING:   Dr. Yves Dill  REASON FOR CONSULTATION: Atrial fibrillation with RVR, rate up to 170 beats per minute.   HISTORY OF PRESENT ILLNESS: A 59 year old male patient came in to have outpatient procedure for left ureteral stone, with left ureteroscopy and stent placement by Dr. Yves Dill.  The patient has chronic atrial fibrillation, and takes metoprolol for that. The patient's cardiologist at [facility] and he takes metoprolol 100 mg twice a day and he took his morning this morning. During his preop eval, the patient found to have heart rate of 170 beats the operating room with atrial fibrillation with RVR.   the procedure was canceled and the patient was brought back to PACU.  The patient persistently had heart rate up to 150s-160s with atrial fibrillation with rapid ventricular rate. Concerning this, I was called in for stat consult.  The patient was seen immediately, the patient denied any chest pain, shortness of breath. Denied any dizziness. He was alert and oriented. He said that he is dehydrated; whenever he gets dehydrated his heart rate would go up.   The patient has no fluids since this morning. The patient's heart rate has been around 160s-170s with atrial fibrillation with RVR and blood pressure varying between 100/60s-90/60s, so we started the IV fluids, normal saline IV fluids, 1 liter bolus, patient also advised to drink plenty of fluids,. Regarding his heart rate, I called Dr. Esmond Plants and he suggested to give him metoprolol 25 mg p.o. 1 time, metoprolol 5 mg IV slow IV push and the patient is given 25 mg of metoprolol tartrate 1 tablet, IV push of 5 milligrams, 1 dose.   The patient's heart rate dropped nicely to 120s-130s; however, it was varying between 130s-140s at times. The patient really wants to go home and he  said he would like to go home and does not want to get admitted.   The same is the concern for his heart rate being so high and the risk of cardiac arrest is explained to the patient and also his wife. So we are going to monitor him in PACU for further symptoms of further atrial fibrillation with RVR.  If the heart rate goes down to like 110, he can go home and take his evening dose of metoprolol and see his cardiologist in the office at [facility]. I discussed this with Dr. Rockey Situ; he suggested the same if his heart rate is  controlled around 110, he can go home and see his cardiologist.   PAST MEDICAL HISTORY: Significant for chronic atrial fibrillation, sleep apnea, kidney stones.   HOME MEDICATIONS:   Include metoprolol tartrate 100 mg p.o. b.i.d.  Coumadin: The patient stopped Coumadin 5 days ago.   The patient has CPAP at home at night.   ALLERGIES: No known allergies.   PAST SURGICAL HISTORY: Significant for history of cardioversion 2 years ago and failed. The patient's cardioversion was done and failed. The patient follows up with [facility] and also EP physician.   FAMILY HISTORY: No hypertension or diabetes.   REVIEW OF SYSTEMS: CONSTITUTIONAL: Has no fever. No fatigue.  EYES: No blurred vision.  ENT: No tinnitus. No epistaxis. No difficulty swallowing.  RESPIRATIONS: No cough. No wheezing.  CARDIOVASCULAR: No chest pain. No orthopnea. No pedal edema.  GASTROINTESTINAL: No nausea. No vomiting. No abdominal  pain.  GENITOURINARY: No dysuria.  ENDOCRINE: No polyuria or nocturia.  HEMATOLOGIC: No anemia.  INTEGUMENTARY: No skin rashes.  MUSCULOSKELETAL: No joint pain.  NEUROLOGIC: No numbness or weakness.  PSYCHIATRIC: No anxiety or insomnia.   PHYSICAL EXAMINATION: VITAL SIGNS: Temperature 98.6, heart rate 160 beats per minute, blood pressure 100/70. The patient is an obese male, well developed, well nourished,  not in distress.  HEAD: Normocephalic, atraumatic.  EYES: Pupils equal,  reacting to light. No conjunctival pallor. No scleral icterus.  NOSE: No nasal lesions. No drainage.  EARS: No redness or external lesions.  MOUTH: No lesions.  Mucosa is clinically dry.  NECK: Supple and symmetric. No masses. Thyroid in the midline, not enlarged. No JVD.  RESPIRATORY:   Good respiratory effort.  Clear to auscultation. Chest symmetric rise and fall.     CARDIOVASCULAR: Irregularly irregular uncontrolled atrial fibrillation. No murmur.  The patient's pulses are variable because of atrial fibrillation.  No extremity edema.  GASTROINTESTINAL: Abdomen is soft, nontender, nondistended. Bowel sounds present.  MUSCULOSKELETAL: Extremities move x 4. No tenderness or effusion. Normal range of motion.  SKIN: Inspection is normal.  LYMPH: No lymphadenopathy.  PSYCHIATRIC: Mood and affect are within normal limits.  Oriented to time, place, person.  NEUROLOGIC: Alert, awake, oriented. No focal neurological deficit. Deep tendon reflexes 2+ bilaterally.  Bilateral upper and lower extremity strength is intact.     LABORATORY DATA: No laboratory data available except INR.   INR today is 1.1. EKG showed atrial fibrillation with RVR at 170 beats per minute.   The patient had labs today:  Electrolytes:  Sodium 137, potassium 4.3, chloride 106, bicarbonate 26, BUN 23, creatinine 1.38, glucose 84.   ASSESSMENT AND PLAN: 64. A 59 year old male with left ureteral stone comes in for ureteral stent and lithotripsy.  Had atrial fibrillation with rapid ventricular response, the  procedure was canceled.  He is in atrial fibrillation with rapid ventricular response with heart rate up to 170s. The patient is given metoprolol at 5 mg IV, 20 mg p.o.  Watch him for heart failure symptoms and heart rate and the patient can probably discharge home if the heart rate still is around 110 and take his metoprolol   p.o. 100 mg, metoprolol tartrate today evening and the same is discussed with the patient. I have discussed  this in detail with Dr. Rockey Situ and he also suggested that if the heart rate is controlled, if he is hemodynamically stable, the patient can be discharged from the PACU without need for admission.  The patient has his own cardiologist and EP study physician at [facility] that he can follow up with them as soon as he is discharged.  2. Regarding left ureteral stone, he needs to have stent and lithotripsy. when his heart rate is controlled and he is more stable.  3. Regarding atrial fibrillation, he can be started back on Coumadin.  4. Mild dehydration.   He received aggressive IV fluids and increased IV fluids.  Hold the Lasix and lisinopril today.  5. Benign prostatic hypertrophy. Continue Flomax.  6. Hyperlipoidemia. Continue pravastatin.   TIME SPENT ON THIS STAT CONSULT:  Is more than 55 minutes. This is a critical care consult.    ____________________________ Epifanio Lesches, MD sk:tr D: 08/28/2014 16:04:03 ET T: 08/28/2014 17:11:39 ET JOB#: [id number]  cc: Epifanio Lesches, MD, <Dictator> Epifanio Lesches MD ELECTRONICALLY SIGNED 09/07/2014 15:36

## 2014-10-05 NOTE — Consult Note (Signed)
General Aspect Primary Cardiologist: [facility] (Dr. Rica Records, MD) Primary Electrophysiologist: [facility] (Dr. Isaias Sakai, MD) Primary Urologist: Dr. Maryan Puls, MD PCP: Festus Aloe: (Dr. [name], MD) ____________  59 year old male with history of chronic a-fib on Coumadin, chronic systolic CHF with previous EF as low as 25% in 2012 most recent EF 55% in 02/2012, obesity, OSA on CPAP, HTN, HLD, recurrent history of renal stones, osteoarthritis of bilateral knees, and depression who recently underwent lithotripsy on 2/25 presented to [facility] on 08/28/2014 for outpatient left ureteral stone lithotripsy with left ureteroscopy and stent placement after being found to have obstructing stone on the office on 08/27/2014. Pre-op he was found to be in a-fib with RVR with HR in the 170s, asymptomatic. Cardiology is consulted for further evalaution.  _____________  PMH: 1. Chronic a-fib on Coumadin 2. Chronic systolic CHF with previous EF as low as 25% in 2012 most recent EF 55% in 02/2012 3. Obesity 4. OSA on CPAP 5. HTN 6. HLD 7. Recurrent history of renal stones 8. Osteoarthritis of bilateral knees 9. Depression ______________   Present Illness 59 year old male with the above problem list who presented to [facility] as an outpatient for the above procedure and was found to be in a-fib with RVR with HR into the 170s, he was asymptomatic. He was originally diagnosed with a-fib back in 2011. At that time he was having PAF, treated with rate control and anticoagulation. He was admitted to [facility] back in May 2011 for acute respiratory failure possibly due to extension of pulmonary embolism and/or worsening of pneumonia per record. 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 Echo at that time showed EF 25% with dilated LV/RV. Ultimately he was appartently transfered to [facility] for continuation of his medical care. Subsequent echos have shown improvement in his EF with echo in 02/2012 showing EF 55% with no  significant valvular disease. Echo in 2014 was a tachnically difficult study 2/2 a-fib with RVR, LV function was difficult to assess, though felt to be mildly reduced, trivial MR, dilated LA. He does report having had a cardiac cath previously, but I do not see record or mention of this in any of his notes. It was felt the decline in EF seen back 2012 was 2/2 tachycardia seen with his a-fib with RVR and nonischemic. He has undergone DCCV x 2 and was placed on Tikosyn s/p DCCV 01/2012. He held NSR for 11 months post DCCV, though he then developed NSVT (per chart) and this was discontinued 12/2012. Upon discontinuation of Tikosyn he went back into a-fib and has remained in a-fib since, making it chronic a-fib. He has been offered ablation, however he does not wish to proceed with this treatment. He remains rate controlled with Toprol XL 100 mg BID.   He has had a prior episode of developing a-fib with RVR preprocedure (colonoscopy).  This was felt to be 2/2 dehydration and delayed procedure. He went back home at that time, hydrated and took his medications with good results. He underwent his colonoscopy a short time later without issues. He does admit to getting worked up about procedures.   Upon his arrival to [facility] on 3/24 he was noted to be in a-fib with RVR pre-op with HR into the 170s. He was asymptomatic. He did take his Toprol XL 100 mg that morning. He did receive IV medication x 1 in the OR with slight improvement in HR per H and P. He  later received IV fluid bolus followed by IV Lopresor 5 mg x 1 along with Lopressor 25 mg po x 1 with continued improvement in HR. His procedure was postponed. HR improved to the 70s. He was admitted for observation. He remained stable and asymptomatic. He reported feeling anxious regarding the procedure. Labs were unremarkable. He is currently asymptomatic and up sitting in his bed.   Physical Exam:  GEN well developed, no acute distress, obese   HEENT PERRL, hearing  intact to voice, moist oral mucosa, poor dentition   NECK supple  trachea midline  no JVD   RESP normal resp effort  clear BS   CARD Regular rate and rhythm  Normal, S1, S2  No murmur   ABD denies tenderness  soft   LYMPH negative neck   EXTR negative edema   SKIN normal to palpation   NEURO cranial nerves intact   PSYCH alert   Review of Systems:  General: No Complaints   Skin: No Complaints   ENT: No Complaints   Eyes: No Complaints   Neck: No Complaints   Respiratory: No Complaints   Cardiovascular: No Complaints   Gastrointestinal: No Complaints   Genitourinary: No Complaints   Vascular: No Complaints   Musculoskeletal: No Complaints   Neurologic: No Complaints   Hematologic: No Complaints   Endocrine: No Complaints   Psychiatric: No Complaints   Review of Systems: All other systems were reviewed and found to be negative   Medications/Allergies Reviewed Medications/Allergies reviewed   Family & Social History:  Family and Social History:  Family History Negative  father: lung CA and HTN   Social History negative tobacco, negative ETOH, negative Illicit drugs   Place of Living Home  lives with his wife     Hyperlipidemia:    Sleep Apnea:    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:    Atrial Fibrillation:    Kidney Stones:    Gout:    Tonsillectomy:    right knee surgery x3:          Admit Diagnosis:   AFIB W RVR LEFT UU WITH HOLMIUM LASER R: Onset Date: 29-Aug-2014, Status: Active, Description: AFIB W RVR LEFT UU WITH HOLMIUM LASER R  Home Medications: Medication Instructions Status  tapentadol 50 mg oral tablet 1 tab(s) orally every 4 to 6 hours, As needed, severe pain (7-10/10) Active  tamsulosin 0.4 mg oral capsule 1 cap(s) orally once a day (after a meal) Active  promethazine 25 mg rectal suppository 1 suppository(ies) rectal every 4 to 6 hours, As needed, nausea, vomiting Active  levofloxacin 500 mg oral tablet 1 tab(s) orally every  24 hours Active  naproxen 500 mg oral tablet 1 tab(s) orally 2 times a day, As Needed Active  traMADol 50 mg oral tablet 1 tab(s) orally once a day, As Needed Active  Centrum Silver Therapeutic Multiple Vitamins with Minerals oral tablet 1 tab(s) orally once a day Active  Vitamin C 500 mg oral tablet 1 tab(s) orally once a day Active  potassium gluconate 1 tab(s) orally once a day Active  furosemide 40 mg oral tablet 0.5 tab(s) orally once a day Active  lisinopril 5 mg oral tablet 1 tab(s) orally once a day Active  metoprolol extended release 100 mg oral tablet, extended release 1 tab(s) orally 2 times a day Active  pravastatin 80 mg oral tablet 1 tab(s) orally once a day Active   Lab Results:  Routine Chem:  25-Mar-16 05:01   Glucose, Serum 91 (65-99 NOTE: New   Reference Range  08/12/14)  BUN  27 (6-20 NOTE: New Reference Range  08/12/14)  Creatinine (comp) 1.08 (0.61-1.24 NOTE: New Reference Range  08/12/14)  Sodium, Serum 140 (135-145 NOTE: New Reference Range  08/12/14)  Potassium, Serum 4.4 (3.5-5.1 NOTE: New Reference Range  08/12/14)  Chloride, Serum 106 (101-111 NOTE: New Reference Range  08/12/14)  CO2, Serum 27 (22-32 NOTE: New Reference Range  08/12/14)  Calcium (Total), Serum  8.8 (8.9-10.3 NOTE: New Reference Range  08/12/14)  Anion Gap 7  eGFR (African American) >60  eGFR (Non-African American) >60 (eGFR values <60mL/min/1.73 m2 may be an indication of chronic kidney disease (CKD). Calculated eGFR is useful in patients with stable renal function. The eGFR calculation will not be reliable in acutely ill patients when serum creatinine is changing rapidly. It is not useful in patients on dialysis. The eGFR calculation may not be applicable to patients at the low and high extremes of body sizes, pregnant women, and vegetarians.)  Routine Hem:  25-Mar-16 05:01   WBC (CBC) 7.4  RBC (CBC) 4.43  Hemoglobin (CBC) 14.4  Hematocrit (CBC) 44.1  Platelet Count  (CBC) 166  MCV 100  MCH 32.5  MCHC 32.6  RDW 13.0  Neutrophil % 53.6  Lymphocyte % 32.9  Monocyte % 8.3  Eosinophil % 4.6  Basophil % 0.6  Neutrophil # 4.0  Lymphocyte # 2.5  Monocyte # 0.6  Eosinophil # 0.3  Basophil # 0.0 (Result(s) reported on 29 Aug 2014 at 07:55AM.)   EKG:  EKG Interp. by me   Interpretation EKG shows a-fib, 167 bpm    Allopurinol: Rash  PCN: Unknown  Zofran ODT: Rash  Vital Signs/Nurse's Notes: **Vital Signs.:   25-Mar-16 05:22  Vital Signs Type Routine  Temperature Temperature (F) 97.4  Celsius 36.3  Temperature Source oral  Pulse Pulse 77  Pulse source if not from Vital Sign Device per cardiac monitor  Respirations Respirations 20  Systolic BP Systolic BP 106  Diastolic BP (mmHg) Diastolic BP (mmHg) 74  Mean BP 84  Pulse Ox % Pulse Ox % 98  Pulse Ox Activity Level  At rest  Oxygen Delivery Room Air/ 21 %    Impression 58 year old male with history of chronic a-fib on Coumadin, chronic systolic CHF with previous EF as low as 25% in 2012 most recent EF 55% in 02/2012, obesity, OSA on CPAP, HTN, HLD, recurrent history of renal stones, osteoarthritis of bilateral knees, and depression who recently underwent lithotripsy on 2/25 presented to [facility] on 08/28/2014 for outpatient left ureteral stone lithotripsy with left ureteroscopy and stent placement after being found to have obstructing stone on the office on 08/27/2014. Pre-op he was found to be in a-fib with RVR with HR in the 170s, asymptomatic.   1. A-fib with RVR: preop, possibly exacerbated by anxiety -Patient has chronic a-fib at baseline -HR has improved into the 70s overnight, would continue Toprol XL 100 mg bid (home dose) -He does get anxious prior to procedures, more so now that he has developed a-fib with RVR previously. Could consider pre-procedural antianxiety with extra beta blocker -Has been on Lovenox bridge 2/2 lithotripsy and ureteral stenting, he may do better with Eliquis 5 mg  bid (normal renal function no valvular pathology) -Start Eliquis 5 mg bid, he would need to hold 2 days prior to next procedure and restart 24 hours post procedure or when able per Urology -He requests cardiology to be involved to his pre-op care next time, we are happy   to assist with this  2. 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 Left ureteral stone: -Per urology  4. HTN: -Controlled -Continue current medications  5. OSA: -On CPAP  6. Obesity  7. HLD: -Pravastatin 80 mg   Electronic Signatures: Dunn, Ryan M (PA-C)  (Signed 25-Mar-16 10:48)  Authored: General Aspect/Present Illness, History and Physical Exam, Review of System, Family & Social History, Past Medical History, Home Medications, Labs, EKG , Allergies, Vital Signs/Nurse's Notes, Impression/Plan Gollan, Timothy (MD)  (Signed 25-Mar-16 19:52)  Authored: General Aspect/Present Illness, History and Physical Exam, Family & Social History, Health Issues, Home Medications, EKG , Impression/Plan  Co-Signer: General Aspect/Present Illness, History and Physical Exam, Review of System, Family & Social History, Past Medical History, Home Medications, Labs, EKG , Allergies, Vital Signs/Nurse's Notes, Impression/Plan   Last Updated: 25-Mar-16 19:52 by Gollan, Timothy (MD) 

## 2014-10-07 ENCOUNTER — Telehealth: Payer: Self-pay

## 2014-10-07 NOTE — Telephone Encounter (Signed)
Needs info for Eliquis so they can do tier exception,. Please call.

## 2014-10-09 NOTE — Telephone Encounter (Signed)
This encounter was created in error - please disregard.

## 2014-10-13 ENCOUNTER — Encounter: Payer: Self-pay | Admitting: Cardiovascular Disease

## 2014-10-16 NOTE — Telephone Encounter (Signed)
LMOM to call regarding prior authorization for Eliquis which has been approved until 06/06/2015 for the tier exception.

## 2014-10-17 ENCOUNTER — Telehealth: Payer: Self-pay | Admitting: *Deleted

## 2014-10-17 NOTE — Telephone Encounter (Signed)
Pt c/o medication issue:  1. Name of Medication: Metroprolol and Eloquis  2. How are you currently taking this medication (dosage and times per day)? Metroprolol 125 am 100pm and Eloquis 1 twice a day  3. Are you having a reaction (difficulty breathing--STAT)? No   4. What is your medication issue?  Patient feels extremely hot then goes extremely cold all over especially in hands. Is this a medication reaction. Went off the meds for a couple of days due to a kidney stone.

## 2014-10-17 NOTE — Telephone Encounter (Signed)
Not a usual side effect I am aware of Wonder if this could be from kidney stone or infection Less likely medication

## 2014-10-18 ENCOUNTER — Encounter: Payer: Self-pay | Admitting: Emergency Medicine

## 2014-10-18 ENCOUNTER — Emergency Department
Admission: EM | Admit: 2014-10-18 | Discharge: 2014-10-18 | Disposition: A | Discharge status: Home / Self Care | Payer: Medicare HMO | Attending: Emergency Medicine | Admitting: Emergency Medicine

## 2014-10-18 DIAGNOSIS — I10 Essential (primary) hypertension: Secondary | ICD-10-CM | POA: Diagnosis not present

## 2014-10-18 DIAGNOSIS — R300 Dysuria: Secondary | ICD-10-CM | POA: Insufficient documentation

## 2014-10-18 DIAGNOSIS — Z79899 Other long term (current) drug therapy: Secondary | ICD-10-CM | POA: Insufficient documentation

## 2014-10-18 DIAGNOSIS — R6883 Chills (without fever): Secondary | ICD-10-CM | POA: Diagnosis present

## 2014-10-18 LAB — URINALYSIS COMPLETE WITH MICROSCOPIC (ARMC ONLY)
Bilirubin Urine: NEGATIVE
Glucose, UA: NEGATIVE mg/dL
Ketones, ur: NEGATIVE mg/dL
Nitrite: NEGATIVE
Protein, ur: 100 mg/dL — AB
Specific Gravity, Urine: 1.01 (ref 1.005–1.030)
Squamous Epithelial / LPF: NONE SEEN
pH: 5 (ref 5.0–8.0)

## 2014-10-18 LAB — COMPREHENSIVE METABOLIC PANEL
ALT: 40 U/L (ref 17–63)
AST: 51 U/L — ABNORMAL HIGH (ref 15–41)
Albumin: 3.8 g/dL (ref 3.5–5.0)
Alkaline Phosphatase: 76 U/L (ref 38–126)
Anion gap: 10 (ref 5–15)
BUN: 19 mg/dL (ref 6–20)
CO2: 23 mmol/L (ref 22–32)
Calcium: 8.8 mg/dL — ABNORMAL LOW (ref 8.9–10.3)
Chloride: 102 mmol/L (ref 101–111)
Creatinine, Ser: 1.32 mg/dL — ABNORMAL HIGH (ref 0.61–1.24)
GFR calc Af Amer: >60 mL/min (ref >60)
GFR calc non Af Amer: 58 mL/min — ABNORMAL LOW (ref >60)
Glucose, Bld: 165 mg/dL — ABNORMAL HIGH (ref 65–99)
Potassium: 4.2 mmol/L (ref 3.5–5.1)
Sodium: 135 mmol/L (ref 135–145)
Total Bilirubin: 1.5 mg/dL — ABNORMAL HIGH (ref 0.3–1.2)
Total Protein: 7.2 g/dL (ref 6.5–8.1)

## 2014-10-18 LAB — CBC WITH DIFFERENTIAL/PLATELET
Basophils Absolute: 0.1 10*3/uL (ref 0–0.1)
Basophils Relative: 1 %
Eosinophils Absolute: 0 10*3/uL (ref 0–0.7)
Eosinophils Relative: 0 %
HCT: 43.1 % (ref 40.0–52.0)
Hemoglobin: 14.7 g/dL (ref 13.0–18.0)
Lymphocytes Relative: 12 %
Lymphs Abs: 1.7 10*3/uL (ref 1.0–3.6)
MCH: 33.5 pg (ref 26.0–34.0)
MCHC: 34.1 g/dL (ref 32.0–36.0)
MCV: 98.4 fL (ref 80.0–100.0)
Monocytes Absolute: 1.3 10*3/uL — ABNORMAL HIGH (ref 0.2–1.0)
Monocytes Relative: 9 %
Neutro Abs: 11.4 10*3/uL — ABNORMAL HIGH (ref 1.4–6.5)
Neutrophils Relative %: 78 %
Platelets: 195 10*3/uL (ref 150–440)
RBC: 4.37 MIL/uL — ABNORMAL LOW (ref 4.40–5.90)
RDW: 13.3 % (ref 11.5–14.5)
WBC: 14.5 10*3/uL — ABNORMAL HIGH (ref 3.8–10.6)

## 2014-10-18 MED ORDER — METOPROLOL TARTRATE 100 MG PO TABS
100.0000 mg | ORAL_TABLET | Freq: Once | ORAL | Status: AC
  Administered 2014-10-18: 100 mg via ORAL

## 2014-10-18 MED ORDER — DEXTROSE 5 % IV SOLN: 1.0000 g | Freq: Once | INTRAVENOUS | Status: DC

## 2014-10-18 MED ORDER — LEVOFLOXACIN 750 MG PO TABS: 750.0000 mg | ORAL_TABLET | Freq: Every day | ORAL | Status: AC

## 2014-10-18 MED ORDER — METOPROLOL TARTRATE 100 MG PO TABS
ORAL_TABLET | ORAL | Status: AC
  Administered 2014-10-18: 100 mg via ORAL
  Filled 2014-10-18: qty 1

## 2014-10-18 MED ORDER — SODIUM CHLORIDE 0.9 % IV SOLN: Freq: Once | INTRAVENOUS | Status: DC

## 2014-10-18 NOTE — ED Provider Notes (Signed)
[facility] Emergency Department Provider Note    ____________________________________________  Time seen: 0930  I have reviewed the triage vital signs and the nursing notes.   HISTORY  Chief Complaint Chills   History limited by: Not Limited   HPI Carlos Crosby is a 59 y.o. male who presents to the emergency department because of concerns for dysuria, chills. Patient had a ureteral stent placed roughly 2 months ago for kidney stones. He states he had been doing fine until a couple of days ago when he started noticing some discomfort on that left side. Initially he noticed burning with urination as well as discoloration of his urine. Denies any true fevers although he has had chills. Denies similar symptoms in the past.     Past Medical History  Diagnosis Date  . Chronic a-fib   . Obesity   . OSA (obstructive sleep apnea)     on CPAP  . Chronic systolic CHF (congestive heart failure)   . Hypertension   . Hyperlipidemia   . History of kidney stones   . Osteoarthritis     bilateral knees  . Depression   . History of pulmonary embolism   . [place] spotted fever 2011    Patient Active Problem List   Diagnosis Date Noted  . Atrial fibrillation, unspecified 09/26/2014  . Morbid obesity 09/26/2014  . Chronic diastolic congestive heart failure 09/26/2014  . Anxiety 09/26/2014  . Obstructive sleep apnea on CPAP 09/26/2014  . Essential hypertension 09/26/2014  . Hyperlipidemia 09/26/2014  . Arthritis, senescent 09/26/2014  . Kidney stone 09/26/2014    Past Surgical History  Procedure Laterality Date  . Lithotripsy  07/31/2014  . Knee surgery      right knee   . Ureteroscopy    . Ureteral stent placement  2016  . Cardiac catheterization  2012    [facility]    Current Outpatient Rx  Name  Route  Sig  Dispense  Refill  . ALPRAZolam (XANAX) 0.5 MG tablet   Oral   Take 1 tablet (0.5 mg total) by mouth at bedtime as needed for anxiety.   3  tablet   0   . apixaban (ELIQUIS) 5 MG TABS tablet   Oral   Take 1 tablet (5 mg total) by mouth 2 (two) times daily.   180 tablet   3   . furosemide (LASIX) 40 MG tablet   Oral   Take 40 mg by mouth daily.         Marland Kitchen levofloxacin (LEVAQUIN) 750 MG tablet   Oral   Take 1 tablet (750 mg total) by mouth daily.   5 tablet   0   . lisinopril (PRINIVIL,ZESTRIL) 5 MG tablet   Oral   Take 5 mg by mouth daily.         . Meth-Hyo-M Bl-Na Phos-Ph Sal (URIBEL) 118 MG CAPS   Oral   Take 118 mg by mouth 4 (four) times daily as needed.         . metoprolol (LOPRESSOR) 100 MG tablet   Oral   Take 1 tablet (100 mg total) by mouth 2 (two) times daily.   180 tablet   3   . Multiple Vitamins-Minerals (CENTRUM SILVER ADULT 50+) TABS   Oral   Take 1 tablet by mouth daily.         Marland Kitchen oxycodone (OXY-IR) 5 MG capsule   Oral   Take 5 mg by mouth 3 (three) times daily as needed.         Marland Kitchen  pravastatin (PRAVACHOL) 80 MG tablet   Oral   Take 1 tablet (80 mg total) by mouth every evening.   90 tablet   3   . traMADol (ULTRAM) 50 MG tablet   Oral   Take 1 tablet (50 mg total) by mouth every 6 (six) hours as needed.   30 tablet      . vitamin C (ASCORBIC ACID) 500 MG tablet   Oral   Take 1 tablet (500 mg total) by mouth daily.           Allergies Allopurinol; Other; Penicillin g; Penicillins; and Ondansetron  Family History  Problem Relation Age of Onset  . Family history unknown: Yes    Social History History  Substance Use Topics  . Smoking status: Never Smoker   . Smokeless tobacco: Not on file  . Alcohol Use: No    Review of Systems  Constitutional: Negative for fever. Cardiovascular: Negative for chest pain. Respiratory: Negative for shortness of breath. Gastrointestinal: Left sided pain Genitourinary: Positive for dysuria. Musculoskeletal: Negative for back pain. Skin: Negative for rash. Neurological: Negative for headaches, focal weakness or  numbness.   10-point ROS otherwise negative.  ____________________________________________   PHYSICAL EXAM:  VITAL SIGNS: ED Triage Vitals  Enc Vitals Group     BP 10/18/14 0619 113/86 mmHg     Pulse Rate 10/18/14 0619 64     Resp 10/18/14 0619 18     Temp 10/18/14 0619 98.5 F (36.9 C)     Temp src --      SpO2 10/18/14 0619 97 %     Weight 10/18/14 0620 300 lb (136.079 kg)     Height 10/18/14 0619 6' (1.829 m)     Head Cir --      Peak Flow --      Pain Score 10/18/14 0620 1   Constitutional: Alert and oriented. Well appearing and in no distress. Eyes: Conjunctivae are normal. PERRL. Normal extraocular movements. ENT   Head: Normocephalic and atraumatic.   Nose: No congestion/rhinnorhea.   Mouth/Throat: Mucous membranes are moist.   Neck: No stridor. Hematological/Lymphatic/Immunilogical: No cervical lymphadenopathy. Cardiovascular: Normal rate, regular rhythm.  No murmurs, rubs, or gallops. Respiratory: Normal respiratory effort without tachypnea nor retractions. Breath sounds are clear and equal bilaterally. No wheezes/rales/rhonchi. Gastrointestinal: Soft and mildly tender to left lower quadrant. No distention. There is no CVA tenderness. Genitourinary: Deferred Musculoskeletal: Normal range of motion in all extremities. No joint effusions.  No lower extremity tenderness nor edema. Neurologic:  Normal speech and language. No gross focal neurologic deficits are appreciated. Speech is normal.  Skin:  Skin is warm, dry and intact. No rash noted. Psychiatric: Mood and affect are normal. Speech and behavior are normal. Patient exhibits appropriate insight and judgment.  ____________________________________________    LABS (pertinent positives/negatives)  Labs Reviewed  CBC WITH DIFFERENTIAL/PLATELET - Abnormal; Notable for the following:    WBC 14.5 (*)    RBC 4.37 (*)    Neutro Abs 11.4 (*)    Monocytes Absolute 1.3 (*)    All other components within  normal limits  COMPREHENSIVE METABOLIC PANEL - Abnormal; Notable for the following:    Glucose, Bld 165 (*)    Creatinine, Ser 1.32 (*)    Calcium 8.8 (*)    AST 51 (*)    Total Bilirubin 1.5 (*)    GFR calc non Af Amer 58 (*)    All other components within normal limits  URINALYSIS COMPLETEWITH MICROSCOPIC [facility])  -  Abnormal; Notable for the following:    Color, Urine YELLOW (*)    APPearance CLOUDY (*)    Hgb urine dipstick 3+ (*)    Protein, ur 100 (*)    Leukocytes, UA 3+ (*)    Bacteria, UA RARE (*)    All other components within normal limits  URINE CULTURE  URINALYSIS, ROUTINE W REFLEX MICROSCOPIC     ____________________________________________   EKG  None  ____________________________________________    RADIOLOGY  None  ____________________________________________   PROCEDURES  Procedure(s) performed: None  Critical Care performed: No  ____________________________________________   INITIAL IMPRESSION / ASSESSMENT AND PLAN / ED COURSE  Pertinent labs & imaging results that were available during my care of the patient were reviewed by me and considered in my medical decision making (see chart for details).  Patient here with concerns for dysuria, left lower quadrant abdominal discomfort. ----------------------------------------- 9:50 AM on 10/18/2014 -----------------------------------------  Spoke with Dr. Yves Dill who recommended no antibiotics at this time. Stated that the urinalysis and leukocytosis findings are to be expected with a ureteral stent. Given lack of fever he would not treat for an infection at this time, additionally given clinic visit recently did not take any current imaging was necessary to check placement of stent. Patient does have prescription for Uribel and pain medications.  ____________________________________________   FINAL CLINICAL IMPRESSION(S) / ED DIAGNOSES  Final diagnoses:  Dysuria     Nance Pear,  MD 10/18/14 1311

## 2014-10-18 NOTE — ED Notes (Signed)
Pt in with co chills and dysuria. States has urinary discomfort and pain, had stent placed 2 months ago for kidney stones.

## 2014-10-18 NOTE — Discharge Instructions (Signed)
As we discussed do not start the antibiotics unless you develop a fever. Additionally please take the Uribel and the pain medications prescribed to by Dr. Yves Dill. Please seek medical attention for any high fevers, severe pain, persistent vomiting, bloody stools or any other new or concerning symptoms.  Ureteral Stent Implantation, Care After Refer to this sheet in the next few weeks. These instructions provide you with information on caring for yourself after your procedure. Your health care provider may also give you more specific instructions. Your treatment has been planned according to current medical practices, but problems sometimes occur. Call your health care provider if you have any problems or questions after your procedure. WHAT TO EXPECT AFTER THE PROCEDURE You should be back to normal activity within 48 hours after the procedure. Nausea and vomiting may occur and are commonly the result of anesthesia. It is common to experience sharp pain in the back or lower abdomen and penis with voiding. This is caused by movement of the ends of the stent with the act of urinating.It usually goes away within minutes after you have stopped urinating. HOME CARE INSTRUCTIONS Make sure to drink plenty of fluids. You may have small amounts of bleeding, causing your urine to be red. This is normal. Certain movements may trigger pain or a feeling that you need to urinate. You may be given medicines to prevent infection or bladder spasms. Be sure to take all medicines as directed. Only take over-the-counter or prescription medicines for pain, discomfort, or fever as directed by your health care provider. Do not take aspirin, as this can make bleeding worse. Your stent will be left in until the blockage is resolved. This may take 2 weeks or longer, depending on the reason for stent implantation. You may have an X-ray exam to make sure your ureter is open and that the stent has not moved out of position (migrated).  The stent can be removed by your health care provider in the office. Medicines may be given for comfort while the stent is being removed. Be sure to keep all follow-up appointments so your health care provider can check that you are healing properly. SEEK MEDICAL CARE IF:  You experience increasing pain.  Your pain medicine is not working. SEEK IMMEDIATE MEDICAL CARE IF:  Your urine is dark red or has blood clots.  You are leaking urine (incontinent).  You have a fever, chills, feeling sick to your stomach (nausea), or vomiting.  Your pain is not relieved by pain medicine.  The end of the stent comes out of the urethra.  You are unable to urinate. Document Released: 01/23/2013 Document Revised: 05/28/2013 Document Reviewed: 01/23/2013 [facility] Patient Information 2015 [place], [place]. This information is not intended to replace advice given to you by your health care provider. Make sure you discuss any questions you have with your health care provider.

## 2014-10-18 NOTE — ED Notes (Signed)
Painful urination, urinary frequency, hesitency, darkened color, chills, hx of urinary stent placement recently.

## 2014-10-20 NOTE — Telephone Encounter (Signed)
Left message on machine for patient to contact the office.   

## 2015-03-30 ENCOUNTER — Ambulatory Visit (INDEPENDENT_AMBULATORY_CARE_PROVIDER_SITE_OTHER): Payer: Medicare HMO | Admitting: Cardiovascular Disease

## 2015-03-30 ENCOUNTER — Encounter: Payer: Self-pay | Admitting: Cardiovascular Disease

## 2015-03-30 ENCOUNTER — Other Ambulatory Visit: Payer: Self-pay

## 2015-03-30 VITALS — BP 110/80 | HR 122 | Ht 72.0 in | Wt 325.5 lb

## 2015-03-30 DIAGNOSIS — E785 Hyperlipidemia, unspecified: Secondary | ICD-10-CM

## 2015-03-30 DIAGNOSIS — Z9989 Dependence on other enabling machines and devices: Secondary | ICD-10-CM

## 2015-03-30 DIAGNOSIS — I4891 Unspecified atrial fibrillation: Secondary | ICD-10-CM

## 2015-03-30 DIAGNOSIS — I10 Essential (primary) hypertension: Secondary | ICD-10-CM

## 2015-03-30 DIAGNOSIS — F419 Anxiety disorder, unspecified: Secondary | ICD-10-CM

## 2015-03-30 DIAGNOSIS — G4733 Obstructive sleep apnea (adult) (pediatric): Secondary | ICD-10-CM | POA: Diagnosis not present

## 2015-03-30 DIAGNOSIS — I5032 Chronic diastolic (congestive) heart failure: Secondary | ICD-10-CM

## 2015-03-30 MED ORDER — APIXABAN 5 MG PO TABS: 5.0000 mg | ORAL_TABLET | Freq: Two times a day (BID) | ORAL | Status: DC

## 2015-03-30 NOTE — Assessment & Plan Note (Signed)
We have encouraged  careful diet management in an effort to lose weight. Unable to exercise very much secondary to shortness of breath

## 2015-03-30 NOTE — Assessment & Plan Note (Signed)
Anxiety was previously an issue prior to his urologic procedure. Symptoms improved with Xanax, extra metoprolol

## 2015-03-30 NOTE — Patient Instructions (Addendum)
You are doing well.  Lasix and potassium as needed for leg swelling, SOB  Please increase the metoprolol up to 125 mg twice a day If rate continues to be elevated,  Increase the metoprolol up to 150 mg twice a day  Ask your wife about the side effect of digoxin  You have been referred to Dr. Caryl Comes for EP  Please call us if you have new issues that need to be addressed before your next appt.  Your physician wants you to follow-up in: 6 months.  You will receive a reminder letter in the mail two months in advance. If you don't receive a letter, please call our office to schedule the follow-up appointment.

## 2015-03-30 NOTE — Telephone Encounter (Signed)
Rx print for patient assistance for eliquis 5 mg

## 2015-03-30 NOTE — Assessment & Plan Note (Addendum)
Chronic atrial fibrillation, Rate difficult to control. Rate up to 120s at rest on today's visit, shortness of breath on exertion. Rate was a major issue prior to procedures in the past requiring high-dose metoprolol, Xanax ( likely a component of anxiety) Wife not with him today but he reports having a problem on digoxin in the past Blood pressure borderline low. We'll recommend he increase metoprolol up to 125 mill grams twice a day Suggested a referral with EP to determine if there are other medications we could use for rate control Potentially could stop lisinopril, take diltiazem 30 mg 3 times a day if tolerated. Extended release dose 120 mg would likely be too much

## 2015-03-30 NOTE — Assessment & Plan Note (Signed)
Encouraged him to stay on his pravastatin 

## 2015-03-30 NOTE — Progress Notes (Signed)
Patient ID: Carlos Crosby, male    DOB: [birth year], 59 y.o.   MRN: [id number]  HPI Comments: [name] is a 59 year old gentleman with morbid obesity, chronic atrial fibrillation (previously on tikosyn), chronic systolic CHF with ejection fraction 25% in 2012, most recently 55% in September 2013, also history of obstructive sleep apnea on CPAP, hypertension, hyperlipidemia, recurrent renal stones, osteoarthritis of the knees bilaterally, depression who underwent lithotripsy on every 25th 2016 and presented for an outpatient left lithotripsy 08/28/2014 but was found to have atrial fibrillation with RVR with heart rates up to 170s prior to the procedure and this was canceled. He was kept overnight in the hospital and heart rate returned to normal on his regular medication regiment, metoprolol succinate 100 mg twice a day He reports that he does not tolerate digoxin ( details unclear) He was changed from warfarin to eliquis 5 g twice a day.  In follow-up today, he reports that he feels fine Heart rate is elevated on EKG but he denies any symptoms His biggest complaint is chronic bilateral knee pain. He was told he might be too high risk for knee surgery Currently in the [place]. Requesting samples of eliquis. Recent kidney stone, weight increase as he stopped his high-protein diet Some shortness of breath with exertion, seems to come and go He takes Lasix as needed with potassium for leg edema  EKG on today's visit shows atrial fibrillation with ventricular rate 123 bpm, PVC  Other past medical history As an outpatient, he went repeat lithotripsy with Dr. Eliberto Crosby several weeks ago and was given Xanax the night before, morning of the procedure and extra metoprolol succinate 50 mg the night before is the morning of the procedure. He reports that his heart rate was well-controlled, Dr. Eliberto Crosby attempted the lithotripsy but was unsuccessful Lithotripsy repeated at a later date Recent removal of  stent in the left urethra    Allergies  Allergen Reactions  . Allopurinol Hives and Swelling  . Other Hives  . Penicillin G Nausea Only    And dirrhea  . Penicillins Other (See Comments)    Pt unsure of rxn; showed up on allergy test  . Ondansetron Rash    Outpatient Encounter Prescriptions as of 03/30/2015  Medication Sig  . ALPRAZolam (XANAX) 0.5 MG tablet Take 1 tablet (0.5 mg total) by mouth at bedtime as needed for anxiety.  Marland Kitchen apixaban (ELIQUIS) 5 MG TABS tablet Take 1 tablet (5 mg total) by mouth 2 (two) times daily.  Marland Kitchen lisinopril (PRINIVIL,ZESTRIL) 5 MG tablet Take 5 mg by mouth daily.  . metoprolol (LOPRESSOR) 100 MG tablet Take 1 tablet (100 mg total) by mouth 2 (two) times daily.  . Multiple Vitamins-Minerals (CENTRUM SILVER ADULT 50+) TABS Take 1 tablet by mouth daily.  . pravastatin (PRAVACHOL) 80 MG tablet Take 1 tablet (80 mg total) by mouth every evening.  . traMADol (ULTRAM) 50 MG tablet Take 1 tablet (50 mg total) by mouth every 6 (six) hours as needed.  . vitamin C (ASCORBIC ACID) 500 MG tablet Take 1 tablet (500 mg total) by mouth daily.  . [DISCONTINUED] furosemide (LASIX) 40 MG tablet Take 40 mg by mouth daily.  . [DISCONTINUED] Meth-Hyo-M Bl-Na Phos-Ph Sal (URIBEL) 118 MG CAPS Take 118 mg by mouth 4 (four) times daily as needed.  . [DISCONTINUED] oxycodone (OXY-IR) 5 MG capsule Take 5 mg by mouth 3 (three) times daily as needed.   No facility-administered encounter medications on file as of 03/30/2015.  Past Medical History  Diagnosis Date  . Chronic a-fib ([place])   . Obesity   . OSA (obstructive sleep apnea)     on CPAP  . Chronic systolic CHF (congestive heart failure) ([place])   . Hypertension   . Hyperlipidemia   . History of kidney stones   . Osteoarthritis     bilateral knees  . Depression   . History of pulmonary embolism   . Rocky Mountain spotted fever 2011    Past Surgical History  Procedure Laterality Date  . Lithotripsy  07/31/2014  .  Knee surgery      right knee   . Ureteroscopy    . Ureteral stent placement  2016  . Cardiac catheterization  2012    [facility]    Social History  reports that he has never smoked. He does not have any smokeless tobacco history on file. He reports that he does not drink alcohol or use illicit drugs.  Family History Family history is unknown by patient.   Review of Systems  Constitutional: Negative.   Respiratory: Negative.   Cardiovascular: Negative.   Gastrointestinal: Negative.   Musculoskeletal: Negative.   Skin: Negative.   Neurological: Negative.   Hematological: Negative.   Psychiatric/Behavioral: Negative.   All other systems reviewed and are negative.   BP 110/80 mmHg  Pulse 122  Ht 6' (1.829 m)  Wt 325 lb 8 oz (147.646 kg)  BMI 44.14 kg/m2  Physical Exam  Constitutional: He is oriented to person, place, and time. He appears well-developed and well-nourished.  Obese  HENT:  Head: Normocephalic.  Nose: Nose normal.  Mouth/Throat: Oropharynx is clear and moist.  Eyes: Conjunctivae are normal. Pupils are equal, round, and reactive to light.  Neck: Normal range of motion. Neck supple. No JVD present.  Cardiovascular: Normal rate, S1 normal, S2 normal, normal heart sounds and intact distal pulses.  An irregularly irregular rhythm present. Exam reveals no gallop and no friction rub.   No murmur heard. Pulmonary/Chest: Effort normal and breath sounds normal. No respiratory distress. He has no wheezes. He has no rales. He exhibits no tenderness.  Abdominal: Soft. Bowel sounds are normal. He exhibits no distension. There is no tenderness.  Musculoskeletal: Normal range of motion. He exhibits no edema or tenderness.  Lymphadenopathy:    He has no cervical adenopathy.  Neurological: He is alert and oriented to person, place, and time. Coordination normal.  Skin: Skin is warm and dry. No rash noted. No erythema.  Psychiatric: He has a normal mood and affect. His behavior is  normal. Judgment and thought content normal.      Assessment and Plan   Nursing note and vitals reviewed.

## 2015-03-30 NOTE — Assessment & Plan Note (Signed)
Recommended he use his Lasix with potassium as needed for leg edema, abdominal bloating, weight gain, shortness of breath

## 2015-03-30 NOTE — Assessment & Plan Note (Signed)
Reports he uses his CPAP

## 2015-03-30 NOTE — Assessment & Plan Note (Signed)
Blood pressure is well controlled on today's visit. No changes made to the medications. 

## 2015-04-07 ENCOUNTER — Other Ambulatory Visit: Payer: Self-pay

## 2015-04-09 ENCOUNTER — Ambulatory Visit: Payer: Medicare HMO | Admitting: Internal Medicine

## 2015-04-20 ENCOUNTER — Ambulatory Visit: Payer: Medicare HMO | Attending: *Deleted

## 2015-04-20 DIAGNOSIS — M25561 Pain in right knee: Secondary | ICD-10-CM | POA: Diagnosis not present

## 2015-04-20 DIAGNOSIS — M25562 Pain in left knee: Secondary | ICD-10-CM | POA: Insufficient documentation

## 2015-04-20 NOTE — Therapy (Signed)
[place] [facility] [address] [place], [place], [address] Phone: [phone]   Fax:  [phone]  Physical Therapy Evaluation  Patient Details  Name: Carlos Crosby MRN: [id number] Date of Birth: 03/13/1956 Referring Provider: Zada Girt  Encounter Date: 04/20/2015    Past Medical History  Diagnosis Date  . Chronic a-fib ([place])   . Obesity   . OSA (obstructive sleep apnea)     on CPAP  . Chronic systolic CHF (congestive heart failure) ([place])   . Hypertension   . Hyperlipidemia   . History of kidney stones   . Osteoarthritis     bilateral knees  . Depression   . History of pulmonary embolism   . [place] spotted fever 2011    Past Surgical History  Procedure Laterality Date  . Lithotripsy  07/31/2014  . Knee surgery      right knee   . Ureteroscopy    . Ureteral stent placement  2016  . Cardiac catheterization  2012    [facility]    There were no vitals filed for this visit.  Visit Diagnosis:  Arthralgia of both knees    PATIENT INFORMATION: Name: Carlos Crosby DOB: 12-28-1955                     Sex: M Date seen:  04/20/15      Time: 1025AM  Address: Physician: This evaluation/justification form will serve as the LMN for the following suppliers: __[employer]________________________ Supplier: Contact Person: Serafina Royals Phone: [phone]   Seating Therapist: Phone:   [phone]   Phone:    Spouse/Parent/Caregiver name:   Phone number:  Insurance/Payer:      Reason for Referral:  Patient/Caregiver Goals:  Patient was seen for face-to-face evaluation for new power wheelchair.  Also present was    Serafina Royals, [employer] to discuss recommendations and wheelchair options.  Further paperwork was completed and sent to vendor.  Patient appears to not qualify for power mobility device at this time per objective findings.   MEDICAL HISTORY: Diagnosis:                         Knee OA            Primary Diagnosis:  Onset: 5+  years ago Diagnosis:    []Progressive Disease Relevant past and future surgeries:  Lithotripsy x 3  Height:  64f 0 In Weight: 315 Explain recent changes or trends in weight:  none  History including Falls: pt reports severe knee OA, but is not a candidate for TKA. Pt reports he can get around for short distances and can stand for a short period of time, but his knees collapse resulting in near falls. Pt has had severe kidney stones and relevant procedures the past year.     HOME ENVIRONMENT: [x]House  []Condo/town home  []Apartment  []Assisted Living    []Lives Alone [x] Lives with Others                                                    Hours with caregiver: n/a  []Home is accessible to patient           Stairs      [x]Yes [] No     Ramp []Yes [x]No Comments:  4  stairs to get in the home with railing.    COMMUNITY ADL:  TRANSPORTATION: [x]Car    []Van    []Public Transportation    []Adapted w/c Lift    []Ambulance    []Other:       []Sits in wheelchair during transport  Employment/School:     Disability for knee OA Specific requirements pertaining to mobility                                                     Other:                                     FUNCTIONAL/SENSORY PROCESSING SKILLS:  Handedness:   [x]Right     []Left    []NA  Comments:                                 Functional Processing Skills for Wheeled Mobility [x]Processing Skills are adequate for safe wheelchair operation  Areas of concern than may interfere with safe operation of wheelchair Description of problem   [] Attention to environment      []Judgment      [] Hearing  [] Vision or visual processing      []Motor Planning  [] Fluctuations in Behavior                                                   VERBAL COMMUNICATION: [x]WFL receptive [x] WFL expressive [x]Understandable  []Difficult to understand  []non-communicative [] Uses an augmented communication device  CURRENT SEATING / MOBILITY: Current Mobility  Base:  [x]None []Dependent []Manual []Scooter []Power  Type of Control:                       Manufacturer:                         Size:                         Age:                           Current Condition of Mobility Base:                                                                                                                     Current Wheelchair components:  Describe posture in present seating system:                                                                            SENSATION and SKIN ISSUES: Sensation [x]Intact  []Impaired []Absent  Level of sensation:                         Pressure Relief: Able to perform effective pressure relief :    [x]Yes  [] No Method:                                                                              If not, Why?:                                                                          Skin Issues/Skin Integrity Current Skin Issues  []Yes [x]No []Intact [] Red area[] Open Area  []Scar Tissue []At risk from prolonged sitting Where                              History of Skin Issues  []Yes [x]No Where                                         When                                               Hx of skin flap surgeries  []Yes [x]No Where                                              When                                                  Limited sitting tolerance []Yes [x]No Hours spent sitting in wheelchair daily:  Complaint of Pain:  Please describe:   Pt reports 5/10 bilateral knee pain at rest                                                                                                           Swelling/Edema:    No lower leg edema. intermittent knee edema                                                                                                                                          ADL STATUS (in reference to wheelchair use):  Indep Assist Unable Indep with Equip Not assessed Comments  Dressing     x                                                           Modified. Difficulty donning/doffing shoes/socks, and underware        Eating        x                                                                                                                      Toileting       x  Bathing               x                                                     Supervision                                                                   Grooming/ Hygiene   x                                                                                                                           Meal Prep    x                                                                                                                      IADLS        x                                                                                                          Bowel Management: [x]Continent  []Incontinent  []Accidents Comments:                                                  Bladder Management: [x]Continent  []Incontinent  []Accidents Comments:  WHEELCHAIR SKILLS: Manual w/c Propulsion: []UE or LE strength and endurance sufficient to participate in ADLs using manual wheelchair Arm : []left []right   [x]Both      Distance: 175f with 72 arm strokes at 1.38 feet/sec. HR 64BPM at start. 56BPM following. Pt c/o R shoulder pain 5/10                                     Foot:  []left []right   []Both  Operate Scooter: [] Strength, hand grip, balance and transfer appropriate for use []Living environment is accessible for use of scooter  Operate Power w/c:  [] Std. Joystick   [] Alternative Controls Indep [] Assist []  Dependent/ unable [] N/A []  []Safe          [] Functional      Distance:               Bed confined without wheelchair [] Yes [x] No   STRENGTH/RANGE OF MOTION:  Range of Motion Strength  Shoulder                       WNL                                             L: 5/5, R 4+/5                                         Elbow                             WNL                                           5/5 bilaterally                                        Wrist/Hand                   WNL                                                      L grip 55lbs, R 68lbs                                                                Hip                  Hip flexion in sitting limited to 20deg, due to soft tissue approximation  R hip flexion 4+/5, knee extension 4+/5, hamstrings 4+/5, hip abduction 4/5, hip adduction 4/5. L hip flexion 4/5, knee extension 4/5, hamstrings 4+/5, hip abduction 4/5, hip adduction 4/5                                               Knee                   R: 0-90deg, L 3-85deg                                                 L   knee extension 4/5, hamstrings 4+/5,             R knee extension 4+/5, hamstrings 4+/5,     Isometric resistance painful                                         Ankle WFL              Ankle DF 4+/5 , PF 4/5                                                    MOBILITY/BALANCE:  [] Patient is totally dependent for mobility                                                                                               Balance Transfers Ambulation  Sitting Balance: Standing Balance: [] Independent [] Independent/Modified Independent  [x] WFL     [x] [facility] [x] Supervision [x] Supervision  [] Uses UE for balance  [] Supervision [] Min Assist [] Ambulates with Assist                           [] Min Assist [] Min assist [] Mod Assist [] Ambulates with Device:      [] RW  [] StW  [x] Kasandra Knudsen  []                 [] Mod Assist [] Mod assist [] Max assist   [] Max Assist [] Max assist [] Dependent [x] Indep. Short Distance Only  [] Unable [] Unable [] Lift / Sling Required Distance (in feet)        162f                     [] Sliding board [] Unable to Ambulate: (Explain:  Cardio Status:  [x]Intact  [] Impaired   [] NA  A-fib, HR measurements may be inaccurate                    Respiratory Status:  [x]Intact   []Impaired   []NA                                     Orthotics/Prosthetics:                                                                         Comments (Address manual vs power w/c vs scooter):  Pt ambulated using SPC x 137f with reports of increasing bilateral knee pain increasing from 5/10 to 9/10. O2 desaturated from 99% to 94%. HR increased from 90 to 135 BPM however HR measurement may be inaccurate due to A-Fib.    Pt requires stand by assist for safety. Pt had on intance where he felt his knees buckle, was able to self correct- needed CGA for safety                                            Anterior / Posterior Obliquity Rotation-Pelvis                               PELVIS    [x] [] []  Neutral Posterior Anterior  [x] [] []  WFL Rt elev Lt elev  [x] [] []  WFL Right Left             Anterior Anterior     [] Fixed [] Other [] Partly Flexible [x] Flexible   [] Fixed [] Other [] Partly Flexible  [x] Flexible  [] Fixed [] Other [] Partly Flexible  [x] Flexible   TRUNK  [] [x] []  [facility]  Lumbar  Kyphosis Lordosis  [x] [] []  WFL Convex Convex  Right Left []c-curve []s-curve                []multiple  [x] Neutral [] Left-anterior [] Right-anterior     [] Fixed [x] Flexible [] Partly Flexible  Other  [] Fixed [] Flexible [] Partly Flexible[] Other  [] Fixed            [] Flexible [] Partly Flexible [] Other    Position Windswept                   HIPS          [x]           []              [] Neutral       Abduct        ADduct         [x]           []           []  Neutral Right           Left      []  Fixed [] Subluxed [] Partly Flexible                [] Dislocated [x] Flexible  [] Fixed [] Other [] Partly Flexible  [] Flexible                 Foot Positioning Knee Positioning                              [x] WFL  []Lt []Rt [x] [facility]  []Lt []Rt    KNEES ROM concerns: ROM concerns:    & Dorsi-Flexed []Lt []Rt                                   FEET Plantar Flexed []Lt []Rt      Inversion                 []Lt []Rt      Eversion                 []Lt []Rt     HEAD [x] Functional [x] Good Head Control                     & [] Flexed         [] Extended [] Adequate Head Control    NECK [] Rotated  Lt  [] Lat Flexed Lt [] Rotated  Rt [] Lat Flexed Rt [] Limited Head Control     [] Cervical Hyperextension [] Absent  Head Control     SHOULDERS ELBOWS WRIST& HAND                                Left     Right    Left     Right    Left     Right   U/E [x]Functional           [x]Functional                                 []Fisting             []Fisting      []elev   []dep      []elev   []dep       [x]pro -[]retract     []pro  []retract []subluxed             []subluxed          Goals for Wheelchair Mobility  [x] Independence with mobility in the home with motor related ADLs (MRADLs)  [x] Independence with MRADLs in the community [] Provide dependent mobility  [] Provide recline     []Provide tilt   Goals for Seating system [] Optimize pressure distribution [x] Provide support needed to facilitate function or safety [] Provide corrective forces to assist with maintaining or improving posture [] Accommodate client's posture:   current seated postures and positions are not flexible or will not tolerate corrective forces [] Client to be independent with relieving pressure in the wheelchair []Enhance physiological function such as breathing, swallowing, digestion  Simulation ideas/Equipment trials:  State why other equipment was unsuccessful:                                                                         MOBILITY BASE RECOMMENDATIONS and JUSTIFICATION: MOBILITY COMPONENT JUSTIFICATION  Manufacturer:           Model:             Pride mobility             614HD Size: Width           Seat Depth             []provide transport from point A to B [x]promote Indep mobility  [x]is not a safe, functional ambulator [x]walker or cane inadequate []non-standard width/depth necessary to accommodate anatomical measurement []                            []Manual Mobility Base []non-functional ambulator    []Scooter/POV  []can safely operate  []can safely transfer   []has adequate trunk stability  []cannot functionally propel manual w/c  [x]Power Mobility Base  []non-ambulatory  [x]cannot functionally propel manual wheelchair (increased time, increased pain) [x] cannot functionally and safely operate scooter/POV(cannot transfer safely onto) []can safely operate and willing to  []Stroller Base []infant/child  []unable to propel manual wheelchair []allows for growth []non-functional ambulator []non-functional UE []Indep mobility is not a goal at this time  []Tilt  []Forward []Backward []Powered tilt  []Manual tilt  []change position against gravitational force on head and shoulders  []change position for pressure relief/cannot weight shift []transfers  []management of tone []rest periods []control edema []facilitate postural control  []                                      []Recline  []Power recline on power base []Manual recline on manual base  []accommodate femur to back angle  []bring to full recline for ADL care  []change position for pressure relief/cannot weight shift []rest periods []repositioning for transfers or clothing/diaper /catheter changes []head positioning  []Lighter weight required []self- propulsion   []lifting []                                                []Heavy Duty required []user weight greater than 250# []extreme tone/ over active movement []broken frame on previous chair []                                    [x] Back  [] Angle Adjustable [] Custom molded xCaptains chair                           []postural control []control of tone/spasticity []accommodation of range of motion []UE functional control [x]accommodation for seating system []                                         []  provide lateral trunk support []accommodate deformity []provide posterior trunk support [x]provide lumbar/sacral support []support trunk in midline []Pressure relief over spinal processes  [x] Seat Cushion X Captains chair                      []impaired sensation  []decubitus ulcers present []history of pressure ulceration []prevent pelvic extension []low maintenance  []stabilize pelvis  []accommodate obliquity []accommodate multiple deformity []neutralize lower extremity position [x]increase pressure distribution []                                          [] Pelvic/thigh support  [] Lateral thigh guide [] Distal medial pad  [] Distal lateral pad [] pelvis in neutral []accommodate pelvis [] position upper legs [] alignment [] accommodate ROM [] decrease adduction []accommodate tone []removable for transfers []decrease abduction  [] Lateral trunk Supports [] Lt     [] Rt []decrease lateral trunk leaning []control tone []contour for increased contact []safety  []accommodate asymmetry []                                                [] Mounting hardware  []lateral trunk supports  []back   []seat []headrest      [] thigh support []fixed   []swing away []attach seat platform/cushion to w/c frame []attach back cushion to w/c frame []mount postural supports []mount headrest  []swing medial thigh support away []swing lateral supports away for transfers  []                                                     Armrests  []fixed [x]adjustable height []removable   [x]swing away  []flip back   []reclining []full length pads []desk    []pads tubular  [x]provide support with elbow at 90   []provide support for w/c tray [x]change of height/angles for variable activities []remove for transfers []allow to come closer to table top []remove for access to tables []                                              Hangers/ Leg rests  []60 []70 []90 []elevating []heavy duty  []articulating []fixed []lift off []swing away     []power []provide LE support  []accommodate to hamstring tightness []elevate legs during recline   []provide change in position for Legs []Maintain placement of feet on footplate []durability []enable transfers []decrease edema []Accommodate lower leg length []                                        Foot support Footplate    []Lt  [] Rt  [] [place] mount []flip up     []depth/angle adjustable []Amputee adapter    [] Lt     [] Rt []provide foot support []accommodate to ankle ROM []transfers []Provide support for residual extremity []  allow foot to go under wheelchair base [] decrease tone  []                                                [] Ankle strap/heel loops []support foot on foot support []decrease extraneous movement []provide input to heel  []protect foot  Tires: []pneumatic  []flat free inserts  []solid  []decrease maintenance  []prevent frequent flats []increase shock absorbency []decrease pain from road shock []decrease spasms from road shock []                                             [] Headrest  []provide posterior head support []provide posterior neck support []provide lateral head support []provide anterior head support []support during tilt and recline []improve feeding   []improve respiration []placement of switches []safety  []accommodate ROM  []accommodate tone []improve visual orientation  [] Anterior chest strap []  Vest [] Shoulder retractors  []decrease forward movement of shoulder []accommodation of TLSO []decrease forward movement of trunk []decrease shoulder elevation []added abdominal support []alignment []assistance with shoulder control  []                                              Pelvic Positioner []Belt []SubASIS bar []Dual Pull []stabilize tone []decrease falling out of chair/ **will not Decrease potential for sliding due to pelvic tilting []prevent excessive rotation []pad for protection over boney prominence []prominence comfort []special pull angle to control rotation []                                                 Upper ExtremitySupport []L   [] R []Arm trough    []hand support [] tray       []full tray []swivel mount []decrease edema      []decrease subluxation   []control tone   []placement for AAC/Computer/EADL []decrease gravitational pull on shoulders []provide midline positioning []provide support to increase UE function []provide hand support in natural position []provide work surface   POWER WHEELCHAIR CONTROLS  [x]Proportional  []Non-Proportional Type                                      []Left  []Right [x]provides access for controlling wheelchair   []lacks motor control to operate proportional drive control []unable to understand proportional controls  Actuator Control Module  []Single  []Multiple   []Allow the client to operate the power seat function(s) through the joystick control   []Safety Reset Switches []Used to change modes and stop the wheelchair when driving in latch mode    []Upgraded Electronics   []programming for accurate control []progressive Disease/changing condition []non-proportional drive control needed []Needed in order to operate power seat functions through joystick control   []Display box []Allows user to see in which mode and drive the wheelchair is set  []necessary for alternate controls    []  Digital interface electronics  []Allows w/c to operate when using alternative drive controls  []ASL Head Array []Allows client to operate wheelchair  through switches placed in tri-panel headrest  []Sip and puff with tubing kit []needed to operate sip and puff drive controls  []Upgraded tracking electronics []increase safety when driving []correct tracking when on uneven surfaces  []Mount for switches or joystick []Attaches switches to w/c  []Swing away for access or transfers []midline for optimal placement []provides for consistent access  []Attendant controlled joystick plus mount []safety []long distance driving []operation of seat functions []compliance with transportation regulations []                                            Rear wheel placement/Axle adjustability []None []semi adjustable []fully adjustable  []improved UE access to wheels []improved stability []changing angle in space for improvement of postural stability []1-arm drive access []amputee pad placement []                               Wheel rims/ hand rims  []metal  []plastic coated []oblique projections []vertical projections []Provide ability to propel manual wheelchair  [] Increase self-propulsion with hand weakness/decreased grasp  Push handles []extended  []angle adjustable  []standard []caregiver access []caregiver assist []allows "hooking" to enable increased ability to perform ADLs or maintain balance  One armed device  []Lt   []Rt []enable propulsion of manual wheelchair with one arm   []                                           Brake/wheel lock extension [] Lt   [] Rt []increase indep in applying wheel locks   []Side guards []prevent clothing getting caught in wheel or becoming soiled [] prevent skin tears/abrasions  Battery:                                            [x]to power wheelchair                                                         Other:                                                                                                                         The above equipment has a life- long use expectancy. Growth and changes in medical and/or functional conditions would be the exceptions.  This is to certify that the therapist has no financial relationship with durable medical provider or manufacturer. The therapist will not receive remuneration of any kind for the equipment recommended in this evaluation.   Patient has mobility limitation that significantly impairs safe, timely participation in one or more mobility related ADL's.  (bathing, toileting, feeding, dressing, grooming, moving from room to room)                                                             [x] Yes [] No Will mobility device sufficiently improve ability to participate and/or be aided in participation of MRADL's?      [x] Yes [] No Can limitation be compensated for with use of a cane or walker?                                                                                [] Yes [x] No Does patient or caregiver demonstrate ability/potential ability & willingness to safely use the mobility device?    [x] Yes [] No Does patient's home environment support use of recommended mobility device?                                                    [] Yes [] No Does patient have sufficient upper extremity function necessary to functionally propel a manual wheelchair?     [x] Yes [] No Does patient have sufficient strength and trunk stability to safely operate a POV (scooter)?                                   [x] Yes [] No Does patient need additional features/benefits provided by a power wheelchair for MRADL's in the home?        [] Yes [x] No Does the patient demonstrate the ability to safely use a power wheelchair?                                                              [x] Yes [] No    Physician's Name Printed:                                                        73 Signature:  Date:     This is to certify that I, the above  signed therapist have the following affiliations: [] This DME provider [] Manufacturer  of recommended equipment [] Patient's long term care facility [x] None of the above  Therapist Name/Signature:            Caryl Pina C. Remona Boom, PT, DPT #[id number]                                 Date: 2015/04/26        [facility] PT Assessment - April 26, 2015 0001    Assessment   Medical Diagnosis Osteoarthritis of knee   Referring Provider [place]   Hand Dominance Right   Precautions   Precautions Fall   Restrictions   Weight Bearing Restrictions No   Balance Screen   Has the patient fallen in the past 6 months Yes   How many times? 1   Has the patient had a decrease in activity level because of a fear of falling?  Yes   Is the patient reluctant to leave their home because of a fear of falling?  Yes   [place] Private residence   Living Arrangements Spouse/significant other   Type of [place] to enter   Entrance Stairs-Number of Steps 4   Ambulation/Gait   Ambulation/Gait Yes   Ambulation/Gait Assistance 6: Modified independent (Device/Increase time)   Assistive device Straight cane                                PT Long Term Goals - 04/26/2015 1347    PT LONG TERM GOAL #1   Title pt will verbalize understanding of purpose for mobility evaluation    Time 1   Period Weeks   Status New               Plan - 26-Apr-2015 1346    Clinical Impression Statement See wheelchair evaluation   PT Frequency One time visit   PT Treatment/Interventions Wheelchair mobility training          G-Codes - 2015-04-26 1348    Functional Assessment Tool Used MMT, ROM, gait assessment, clinical judgement    Functional Limitation Mobility: Walking and moving around   Mobility: Walking and Moving Around Current Status [phone]) At least 40 percent but less than 60 percent impaired, limited or restricted   Mobility: Walking and Moving  Around Goal Status [phone]) At least 40 percent but less than 60 percent impaired, limited or restricted   Mobility: Walking and Moving Around Discharge Status [phone]) At least 40 percent but less than 60 percent impaired, limited or restricted       Problem List Patient Active Problem List   Diagnosis Date Noted  . Atrial fibrillation, unspecified 09/26/2014  . Morbid obesity ([place]) 09/26/2014  . Chronic diastolic congestive heart failure ([place]) 09/26/2014  . Anxiety 09/26/2014  . Obstructive sleep apnea on CPAP 09/26/2014  . Essential hypertension 09/26/2014  . Hyperlipidemia 09/26/2014  . Arthritis, senescent 09/26/2014  . Kidney stone 09/26/2014   Gorden Harms. Chao Blazejewski, PT, DPT [phone]  Julien Oscar 2015-04-26, 1:49 PM  [place] MAIN [facility] SERVICES [address] [place], [place], [address] Phone: [phone]   Fax:  [phone]  Name: JOSEDEJESUS MARCUM MRN: [id number] Date of Birth: 11/22/1955

## 2015-05-11 ENCOUNTER — Encounter: Payer: Self-pay | Admitting: Cardiovascular Disease

## 2015-05-12 ENCOUNTER — Telehealth: Payer: Self-pay

## 2015-05-12 NOTE — Telephone Encounter (Signed)
Tier exception for Eliquis 5mg  sent to [facility] ( for 2017)

## 2015-05-14 ENCOUNTER — Encounter: Payer: Self-pay | Admitting: Internal Medicine

## 2015-05-14 ENCOUNTER — Ambulatory Visit (INDEPENDENT_AMBULATORY_CARE_PROVIDER_SITE_OTHER): Payer: Medicare HMO | Admitting: Internal Medicine

## 2015-05-14 VITALS — BP 134/78 | HR 134 | Ht 71.75 in | Wt 331.8 lb

## 2015-05-14 DIAGNOSIS — I4891 Unspecified atrial fibrillation: Secondary | ICD-10-CM | POA: Diagnosis not present

## 2015-05-14 LAB — TSH: TSH: 2.161 u[IU]/mL (ref 0.350–4.500)

## 2015-05-14 MED ORDER — DIGOXIN 250 MCG PO TABS: 0.2500 mg | ORAL_TABLET | Freq: Every day | ORAL | Status: DC

## 2015-05-14 NOTE — Patient Instructions (Addendum)
Medication Instructions: 1) Start digoxin (lanoxin) 0.25 mg one tablet by mouth once daily  Labwork: - Your physician recommends that you have lab work today: TSH  Procedures/Testing: - Your physician has recommended that you wear a 24 hour holter monitor- in 1 week [employer]). Holter monitors are medical devices that record the heart's electrical activity. Doctors most often use these monitors to diagnose arrhythmias. Arrhythmias are problems with the speed or rhythm of the heartbeat. The monitor is a small, portable device. You can wear one while you do your normal daily activities. This is usually used to diagnose what is causing palpitations/syncope (passing out).  - Your physician has requested that you have an echocardiogram- in 2 weeks [facility]) chocardiography is a painless test that uses sound waves to create images of your heart. It provides your doctor with information about the size and shape of your heart and how well your heart's chambers and valves are working. This procedure takes approximately one hour. There are no restrictions for this procedure.  Follow-Up: - Pending the results of your test.  Any Additional Special Instructions Will Be Listed Below (If Applicable). - none

## 2015-05-14 NOTE — Progress Notes (Signed)
ELECTROPHYSIOLOGY CONSULT NOTE  Patient ID: Carlos Crosby, MRN: [id number], DOB/AGE: 01/31/1956 59 y.o. Admit date: (Not on file) Date of Consult: 05/14/2015  Primary Physician: [name], MD Primary Cardiologist: TG  Chief Complaint: Atrial Fibrillation with RVR   HPI Carlos Crosby is a 59 y.o. male  With morbidly obese and persistent atrial fibrillation dating back to   2014  Echo at that time normal LV function  he was cardioverted on one occasion remotely requiring 2 defibrillators. There've been no efforts to restore sinus rhythm since 2014. Following reversion to atrial fibrillation discussions were held at [facility] regarding catheter ablation; he declined.   This strategy has been rate control and this is been increasingly problematic of late.   He is referred because of persistently rapid rates.  He has significant exercise intolerance largely associated with his knees. He is able to walk in a pool. He unfortunately does not have easy access to a pool.  He has untreated sleep apnea.  He has moderate peripheral edema    His past medical history in addition to the above is complicated as outlined below.  Review of old records suggests that there was a resolution of his significant cardiomyopathy a few years ago  Past Medical History  Diagnosis Date  . Chronic a-fib ([place])   . Obesity   . OSA (obstructive sleep apnea)     on CPAP  . Chronic systolic CHF (congestive heart failure) ([place])   . Hypertension   . Hyperlipidemia   . History of kidney stones   . Osteoarthritis     bilateral knees  . Depression   . History of pulmonary embolism   . Rocky Mountain spotted fever 2011      Surgical History:  Past Surgical History  Procedure Laterality Date  . Lithotripsy  07/31/2014  . Knee surgery      right knee   . Ureteroscopy    . Ureteral stent placement  2016  . Cardiac catheterization  2012    [facility]     Home Meds: Prior to Admission medications     Medication Sig Start Date End Date Taking? Authorizing Provider  ALPRAZolam Duanne Moron) 0.5 MG tablet Take 1 tablet (0.5 mg total) by mouth at bedtime as needed for anxiety. 09/26/14  Yes Minna Merritts, MD  apixaban (ELIQUIS) 5 MG TABS tablet Take 1 tablet (5 mg total) by mouth 2 (two) times daily. 03/30/15  Yes Minna Merritts, MD  lisinopril (PRINIVIL,ZESTRIL) 5 MG tablet Take 5 mg by mouth daily.   Yes Historical Provider, MD  metoprolol (LOPRESSOR) 100 MG tablet Take 100 mg by mouth 2 (two) times daily.   Yes Historical Provider, MD  metoprolol succinate (TOPROL-XL) 50 MG 24 hr tablet Take 25 mg by mouth 2 (two) times daily. Take with or immediately following a meal.   Yes Historical Provider, MD  Multiple Vitamins-Minerals (CENTRUM SILVER ADULT 50+) TABS Take 1 tablet by mouth daily. 09/04/14  Yes Historical Provider, MD  pravastatin (PRAVACHOL) 80 MG tablet Take 1 tablet (80 mg total) by mouth every evening. 09/04/14  Yes Historical Provider, MD  traMADol (ULTRAM) 50 MG tablet Take 1 tablet (50 mg total) by mouth every 6 (six) hours as needed. 09/04/14  Yes Historical Provider, MD  vitamin C (ASCORBIC ACID) 500 MG tablet Take 1 tablet (500 mg total) by mouth daily. 09/04/14  Yes Historical Provider, MD    Allergies:  Allergies  Allergen Reactions  . Allopurinol  Hives and Swelling  . Other Hives  . Penicillin G Nausea Only    And dirrhea  . Penicillins Other (See Comments)    Pt unsure of rxn; showed up on allergy test  . Ondansetron Rash    Social History   Social History  . Marital Status: Married    Spouse Name: N/A  . Number of Children: N/A  . Years of Education: N/A   Occupational History  . Not on file.   Social History Main Topics  . Smoking status: Never Smoker   . Smokeless tobacco: Not on file  . Alcohol Use: No  . Drug Use: No  . Sexual Activity: Not on file   Other Topics Concern  . Not on file   Social History Narrative     Family History  Problem  Relation Age of Onset  . Family history unknown: Yes     ROS:  Please see the history of present illness.     All other systems reviewed and negative.    Physical Exam:   Blood pressure 134/78, pulse 134, height 5' 11.75" (1.822 m), weight 331 lb 12.8 oz (150.503 kg). General: Well developed, Morbidly obese   male in no acute distress. Head: Normocephalic, atraumatic, sclera non-icteric, no xanthomas, nares are without discharge. EENT: normal  Lymph Nodes:  none Neck: Negative for carotid bruits. JVP 8-10 cm Back:without scoliosis kyphosis*  Lungs: Clear bilaterally to auscultation without wheezes, rales, or rhonchi. Breathing is unlabored. Heart: Irregularly irregular rhythm murmur . No rubs, or gallops appreciated. Abdomen: Soft, non-tender, non-distended with normoactive bowel sounds. No hepatomegaly. No rebound/guarding. No obvious abdominal masses. Msk:  Strength and tone appear normal for age. Extremities: No clubbing or cyanosis. 2+* edema.  Distal pedal pulses are 2+ and equal bilaterally. Skin: Warm and Dry Neuro: Alert and oriented X 3. CN III-XII intact Grossly normal sensory and motor function . Psych:  Responds to questions appropriately with a normal affect.      Labs: Cardiac Enzymes No results for input(s): CKTOTAL, CKMB, TROPONINI in the last 72 hours. CBC Lab Results  Component Value Date   WBC 14.5* 10/18/2014   HGB 14.7 10/18/2014   HCT 43.1 10/18/2014   MCV 98.4 10/18/2014   PLT 195 10/18/2014   PROTIME: No results for input(s): LABPROT, INR in the last 72 hours. Chemistry No results for input(s): NA, K, CL, CO2, BUN, CREATININE, CALCIUM, PROT, BILITOT, ALKPHOS, ALT, AST, GLUCOSE in the last 168 hours.  Invalid input(s): LABALBU Lipids No results found for: CHOL, HDL, LDLCALC, TRIG BNP No results found for: PROBNP Thyroid Function Tests: No results for input(s): TSH, T4TOTAL, T3FREE, THYROIDAB in the last 72 hours.  Invalid input(s):  FREET3 Miscellaneous No results found for: DDIMER  Radiology/Studies:  No results found.  EKG: AFib 134 -/09/34   Assessment and Plan:   Atrial fibrillation with a rapid ventricular response and congestive heart failure question LV function  Morbid obesity  Sleep apnea     The patient has persistently rapid rates and associated with exercise intolerance. He is knees are largely dominating as relates to exercise. Still, I worry about the contribution of his rapid rate to his exercise intolerance as well as its potential to aggravate cardiomyopathy, i.e. a rate-related myopathy perhaps recurring (as noted above there is a comment about previously resolved cardiomyopathy).  Hence, we will undertake an Holter monitor to look at heart rate excursion we will also undertake an echocardiogram to look at left ventricular function as  well as left atrial size. This would inform urgency as relates to treatment of his rate potentially broaching the option of AV junction ablation not withstanding his young age or referral for catheter ablation not withstanding the long standing nature of his A. Fib  In the interim we will begin him on digoxin. review of old potassium levels demonstrate that they have always been above 4  I have strongly encouraged him to follow-up with his PCP at [facility] regarding sleep testing     Virl Axe

## 2015-05-15 ENCOUNTER — Telehealth: Payer: Self-pay

## 2015-05-15 NOTE — Telephone Encounter (Signed)
Tier exception for Eliquis 5mg  approved by [facility].

## 2015-05-18 ENCOUNTER — Ambulatory Visit (INDEPENDENT_AMBULATORY_CARE_PROVIDER_SITE_OTHER): Payer: Medicare HMO

## 2015-05-18 DIAGNOSIS — I4891 Unspecified atrial fibrillation: Secondary | ICD-10-CM | POA: Diagnosis not present

## 2015-05-18 DIAGNOSIS — I5032 Chronic diastolic (congestive) heart failure: Secondary | ICD-10-CM

## 2015-05-18 DIAGNOSIS — I10 Essential (primary) hypertension: Secondary | ICD-10-CM

## 2015-05-19 ENCOUNTER — Encounter: Payer: Self-pay | Admitting: Internal Medicine

## 2015-05-25 ENCOUNTER — Other Ambulatory Visit: Payer: Self-pay | Admitting: Internal Medicine

## 2015-05-25 ENCOUNTER — Ambulatory Visit
Admission: RE | Admit: 2015-05-25 | Discharge: 2015-05-25 | Disposition: A | Discharge status: Home / Self Care | Payer: Medicare HMO | Source: Ambulatory Visit | Attending: Internal Medicine | Admitting: Internal Medicine

## 2015-05-25 ENCOUNTER — Ambulatory Visit: Payer: Medicare HMO

## 2015-05-25 DIAGNOSIS — I4891 Unspecified atrial fibrillation: Secondary | ICD-10-CM | POA: Insufficient documentation

## 2015-05-25 DIAGNOSIS — M1990 Unspecified osteoarthritis, unspecified site: Secondary | ICD-10-CM | POA: Insufficient documentation

## 2015-05-25 DIAGNOSIS — N200 Calculus of kidney: Secondary | ICD-10-CM | POA: Insufficient documentation

## 2015-05-25 DIAGNOSIS — E785 Hyperlipidemia, unspecified: Secondary | ICD-10-CM | POA: Insufficient documentation

## 2015-05-25 DIAGNOSIS — I10 Essential (primary) hypertension: Secondary | ICD-10-CM | POA: Insufficient documentation

## 2015-05-25 DIAGNOSIS — F419 Anxiety disorder, unspecified: Secondary | ICD-10-CM | POA: Insufficient documentation

## 2015-06-02 ENCOUNTER — Other Ambulatory Visit: Payer: Self-pay

## 2015-06-02 ENCOUNTER — Ambulatory Visit (INDEPENDENT_AMBULATORY_CARE_PROVIDER_SITE_OTHER): Payer: Medicare HMO

## 2015-06-02 DIAGNOSIS — I4891 Unspecified atrial fibrillation: Secondary | ICD-10-CM | POA: Diagnosis not present

## 2015-06-05 ENCOUNTER — Encounter: Payer: Self-pay | Admitting: Internal Medicine

## 2015-06-05 NOTE — Telephone Encounter (Signed)
Contacting patient about recent rx request for Metoprolol Succinate and Digoxin. He would like them to be sent in after the new year due to being in the donut hole with insurance.  Patient was asking about holter monitor and Echo results. Patient also states that he has received letters in the mail stating he missed his Holter Monitor Appointment on Dec 19, but by Dec 19 he had already worn and returned monitor for results.   He states his monitor was placed on by Hiraa at the [facility] and was returned to her as well.   Epic shows his results were uploaded [phone], 2016 but are still saying in progress.

## 2015-06-09 ENCOUNTER — Encounter: Payer: Self-pay | Admitting: Internal Medicine

## 2015-06-09 ENCOUNTER — Other Ambulatory Visit: Payer: Self-pay

## 2015-06-09 MED ORDER — DIGOXIN 250 MCG PO TABS: 0.2500 mg | ORAL_TABLET | Freq: Every day | ORAL | Status: DC

## 2015-06-09 MED ORDER — METOPROLOL TARTRATE 100 MG PO TABS: 100.0000 mg | ORAL_TABLET | Freq: Two times a day (BID) | ORAL | Status: DC

## 2015-06-09 NOTE — Progress Notes (Signed)
Holter report was reviewed. Average heart rate in waking hours ranged from 91-124. Maximum heart rates in waking hours were accessible 175/m nocturnal minimum over the range of 45-50 bpm

## 2015-06-10 ENCOUNTER — Encounter: Payer: Self-pay | Admitting: Internal Medicine

## 2015-06-10 NOTE — Progress Notes (Signed)
I called and spoke with the patient.  He is aware of his echo/ holter results. He will let me know today if he can come see Dr. Caryl Comes in [place] on 1/10 or 1/12.

## 2015-06-11 ENCOUNTER — Telehealth: Payer: Self-pay | Admitting: Cardiovascular Disease

## 2015-06-11 NOTE — Telephone Encounter (Signed)
Pt c/o medication issue:  1. Name of Medication: metoprolol   2. How are you currently taking this medication (dosage and times per day)? 150 mg po x 2 daily   3. Are you having a reaction (difficulty breathing--STAT)?  None   4. What is your medication issue? Needs clarification on dosage that should be sent to [facility] .  Pharmacy is unclear as to which quantity of medication to dispense

## 2015-06-11 NOTE — Telephone Encounter (Signed)
Spoke with Cecille Rubin at [place] regarding the metoprolol dose. Please verify if tartrate or succinate and what dose is the patient needing to be on. Looking at last office note the metoprolol was increased to 150 mg twice a day.

## 2015-06-14 NOTE — Telephone Encounter (Signed)
Patient met with Dr. Caryl Comes It would appear that changes were made to metoprolol. Metoprolol succinate started with tartrate? Would ask nurse working with Dr. Caryl Comes for specifics If no clarification, continue on metoprolol tartrate 100 mg twice a day

## 2015-06-15 NOTE — Telephone Encounter (Signed)
Nira Conn, can you help?

## 2015-06-16 ENCOUNTER — Ambulatory Visit (INDEPENDENT_AMBULATORY_CARE_PROVIDER_SITE_OTHER): Payer: Medicare HMO | Admitting: Internal Medicine

## 2015-06-16 ENCOUNTER — Encounter: Payer: Self-pay | Admitting: Internal Medicine

## 2015-06-16 VITALS — BP 108/74 | HR 103 | Ht 72.0 in | Wt 331.0 lb

## 2015-06-16 DIAGNOSIS — I4891 Unspecified atrial fibrillation: Secondary | ICD-10-CM

## 2015-06-16 MED ORDER — DIGOXIN 250 MCG PO TABS: 0.2500 mg | ORAL_TABLET | Freq: Every day | ORAL | Status: DC

## 2015-06-16 MED ORDER — DILTIAZEM HCL ER COATED BEADS 120 MG PO CP24: 120.0000 mg | ORAL_CAPSULE | Freq: Every day | ORAL | Status: DC

## 2015-06-16 NOTE — Progress Notes (Signed)
Patient Care Team: [name], MD as PCP - General (Pediatrics)   HPI  Carlos Crosby is a 60 y.o. male Seen in follow-up because of persistent atrial fibrillation dating back to 2014. He declined catheter ablation at [facility] at that time. A strategy of rate control had been pursued; it has been largely unsuccessful. Interval assessment of left ventricular systolic function has demonstrated significant worsening with an ejection fraction of 30-35%. He is  on double beta blockers and Lanoxin.  Blood pressures have   been all relatively soft impacting decisions on dose up titration  He is feeling better with fewer palps and less SOB since the initiation of dig  NO edema  He comes intoday with his wife and questions  Records and Results Reviewed  HOLTER 12/16   Mean92   46--159   Daytime avg >100 Nighttime avg about 76; echo  Past Medical History  Diagnosis Date  . Chronic a-fib ([place])   . Obesity   . OSA (obst ructive sleep apnea)     on CPAP  . Chronic systolic CHF (congestive heart failure) ([place])   . Hypertension   . Hyperlipidemia   . History of kidney stones   . Osteoarthritis     bilateral knees  . Depression   . History of pulmonary embolism   . Rocky Mountain spotted fever 2011    Past Surgical History  Procedure Laterality Date  . Lithotripsy  07/31/2014  . Knee surgery      right knee   . Ureteroscopy    . Ureteral stent placement  2016  . Cardiac catheterization  2012    [facility]    Current Outpatient Prescriptions  Medication Sig Dispense Refill  . ALPRAZolam (XANAX) 0.5 MG tablet Take 1 tablet (0.5 mg total) by mouth at bedtime as needed for anxiety. 3 tablet 0  . apixaban (ELIQUIS) 5 MG TABS tablet Take 1 tablet (5 mg total) by mouth 2 (two) times daily. 180 tablet 3  . digoxin (LANOXIN) 0.25 MG tablet Take 1 tablet (0.25 mg total) by mouth daily. 90 tablet 3  . lisinopril (PRINIVIL,ZESTRIL) 5 MG tablet Take 1/2 tablet (2.5 mg) by mouth once daily    .  Multiple Vitamins-Minerals (CENTRUM SILVER ADULT 50+) TABS Take 1 tablet by mouth daily.    . pravastatin (PRAVACHOL) 80 MG tablet Take 1 tablet (80 mg total) by mouth every evening. 90 tablet 3  . traMADol (ULTRAM) 50 MG tablet Take 1 tablet (50 mg total) by mouth every 6 (six) hours as needed. 30 tablet   . vitamin C (ASCORBIC ACID) 500 MG tablet Take 1 tablet (500 mg total) by mouth daily.     No current facility-administered medications for this visit.    Allergies  Allergen Reactions  . Allopurinol Hives and Swelling  . Other Hives  . Penicillin G Nausea Only    And dirrhea  . Penicillins Other (See Comments)    Pt unsure of rxn; showed up on allergy test  . 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  OSA  Morbidly obese  HTN    HIS HR remains rapid albeit better since the initiaion of digoxin which level we will assess tioday We will also change his betablocker dose to allow adjuntive CCB; also decrease his lisinopril as BP haas been limiting primarily, although some HR slowing at night has been a concern  We have reviewed drug SE  Will reassess HR control in about one month with repeat holter and if better repeat echo  If LV dysfunction remains and HR still a problem will discuss again AV ablation and pacemaker  Hopefully however with improved rated control, we  will see recovery of LV function as was previously noted  LA size in 3 d is >65cc./m2

## 2015-06-16 NOTE — Telephone Encounter (Signed)
Patient seen in clinic today. Placed on metoprolol succinate 100 mg BID.

## 2015-06-16 NOTE — Patient Instructions (Addendum)
Medication Instructions: 1) Decrease lisinopril to 5 mg 1/2 tablet (2.5 mg) by mouth once daily 2) Take Toprol (metoprolol succinate) to 100 mg one tablet by mouth twice daily 3) Start cardiazem (diltiazem) 120 mg one capsule by mouth once daily  ( Digoxin has been sent to [facility] to be refilled)  Labwork: Your physician recommends that you have lab work today: Digoxin level  Procedures/Testing: - none  Follow-Up: - Your physician recommends that you schedule a follow-up appointment in: 4 weeks with Dr. Caryl Comes.  Any Additional Special Instructions Will Be Listed Below (If Applicable).

## 2015-06-17 LAB — DIGOXIN LEVEL: Digoxin, Serum: 1.1 ng/mL (ref 0.9–2.0)

## 2015-06-18 ENCOUNTER — Ambulatory Visit: Payer: Medicare HMO | Admitting: Internal Medicine

## 2015-06-18 ENCOUNTER — Encounter: Payer: Self-pay | Admitting: Internal Medicine

## 2015-06-19 ENCOUNTER — Telehealth: Payer: Self-pay | Admitting: Internal Medicine

## 2015-06-19 DIAGNOSIS — I4891 Unspecified atrial fibrillation: Secondary | ICD-10-CM

## 2015-06-19 NOTE — Telephone Encounter (Signed)
I spoke with the patient. He states that he took his 2nd dose of cardizem yesterday and broke out in a rash from head to toe. I advised him to hold cardizem over the weekend and we can re-evaluate on this on Monday. He inquired if he could go back on his higher dose of metoprolol succ at 150 mg BID over the weekend. I advised he could do this in light of his higher HR's. He is taking benadryl PRN for his rash/ itching- I advised this is ok. He is also using hydrocortisone cream.  I advised the patient I will call him back on Monday to evaluate his symptoms and then review with Dr. Caryl Comes. He is agreeable.

## 2015-06-19 NOTE — Telephone Encounter (Signed)
New message      Pt c/o medication issue:  1. Name of Medication: cardizem 2. How are you currently taking this medication (dosage and times per day)? 120mg  3. Are you having a reaction (difficulty breathing--STAT)? Yes-----fine raised bumps that sometimes itch  4. What is your medication issue? Pt states he is having an allergic reaction to what he thinks is coming from the cardizem.  However, he does take other medications.  He has not taken his morning meds yet---waiting on call from nurse.  Please call asap so that he can take his other medicatin

## 2015-06-22 ENCOUNTER — Encounter: Payer: Self-pay | Admitting: *Deleted

## 2015-06-22 MED ORDER — PREDNISONE 5 MG (21) PO TBPK: ORAL_TABLET | ORAL | Status: DC

## 2015-06-22 NOTE — Telephone Encounter (Signed)
The patient is aware that we have sent him a Prednisone dose pack to [facility] in [place]. I advised him to continue his present medications as we discussed on Friday. I will call him back on Thursday to follow up on his rash. He is agreeable. Diltiazem has been added to his allergy list.

## 2015-06-22 NOTE — Telephone Encounter (Signed)
Pt taking Cartizem and two days later he got a rash, stopped taking on Friday, rash worse-throat swollen this am not now has been taking Benadryl-wants RX -pls adivse [phone]

## 2015-06-22 NOTE — Telephone Encounter (Signed)
I called and spoke with the patient. He states his rash did get worse over the weekend, which is typical for him if he develops a rash. He had small red dots on her his skin on Friday, but over the weekend, these merged together to form patches. He did feel like his throat was closing up this morning, but he took benadryl and this resolved. Advised I would review with Dr. Caryl Comes and call him back. He is agreeable. I inquired if he has had prednisone previously and he has. He tolerates this fine. If RX to be called in- call to [facility].  Reviewed with Dr. Caryl Comes- ok to give a prednisone dose pack.

## 2015-06-25 NOTE — Telephone Encounter (Signed)
I reviewed the below with Dr. Caryl Comes. Recommendations are to stay on his same medications for now, obtain the [employer] monitor of able, and wear a 24 hour holter 1 week prior to his office visit on 07/14/15 with Dr. Caryl Comes. The patient is agreeable with all of the above and states he already ordered the [employer] monitor today. I advised him I will place the order for the holter and he will be contacted to schedule this.

## 2015-06-25 NOTE — Telephone Encounter (Signed)
I left a message for the patient to call. 

## 2015-06-25 NOTE — Telephone Encounter (Signed)
I spoke with the patient today to follow up on his rash. He states this is 85-90% better today. I inquired how his HR's are doing on metoprolol and he states his phone is telling him they are running in the the 70's but this is what he saw when we first saw him in the office and his rates were elevated. He states his wife tells him they "feel" ok as he states he cannot feel his own pulse. I advised him to try getting a automatic BP/ HR monitor for home. Also discussed the [employer] app with him. I explained I would review his medications with Dr. Caryl Comes and call him back to discuss recommendations. He is agreeable.

## 2015-06-25 NOTE — Telephone Encounter (Signed)
Lets get 24 hr monitor a wwkek before we see hime 2/17

## 2015-07-06 ENCOUNTER — Ambulatory Visit (INDEPENDENT_AMBULATORY_CARE_PROVIDER_SITE_OTHER): Payer: Medicare HMO

## 2015-07-06 DIAGNOSIS — I4891 Unspecified atrial fibrillation: Secondary | ICD-10-CM

## 2015-07-09 ENCOUNTER — Ambulatory Visit
Admission: RE | Admit: 2015-07-09 | Discharge: 2015-07-09 | Disposition: A | Discharge status: Home / Self Care | Payer: Medicare HMO | Source: Ambulatory Visit | Attending: Internal Medicine | Admitting: Internal Medicine

## 2015-07-09 DIAGNOSIS — I4891 Unspecified atrial fibrillation: Secondary | ICD-10-CM | POA: Diagnosis not present

## 2015-07-14 ENCOUNTER — Encounter: Payer: Self-pay | Admitting: Internal Medicine

## 2015-07-14 ENCOUNTER — Ambulatory Visit (INDEPENDENT_AMBULATORY_CARE_PROVIDER_SITE_OTHER): Payer: Medicare HMO | Admitting: Internal Medicine

## 2015-07-14 VITALS — BP 110/78 | HR 84 | Ht 71.0 in | Wt 335.8 lb

## 2015-07-14 DIAGNOSIS — I4891 Unspecified atrial fibrillation: Secondary | ICD-10-CM

## 2015-07-14 NOTE — Progress Notes (Signed)
Patient Care Team: [name], MD as PCP - General (Pediatrics)   HPI  Carlos Crosby is a 60 y.o. male Seen in follow-up because of persistent atrial fibrillation dating back to 2014. He declined catheter ablation at [facility] at that time. A strategy of rate control had been pursued; it has been largely unsuccessful. Interval assessment of left ventricular systolic function has demonstrated significant worsening with an ejection fraction of 30-35%.     Records and Results Reviewed  HOLTER 12/16   Mean92   46--159   Daytime avg >100 Nighttime avg about 76; echo   Repeat holter  07/08/14   Mean 93   52-185  Daytime Average 80-90 x first three hours  His breathing is better. He has no edema. His major limitation now is his knees  Past Medical History  Diagnosis Date  . Chronic a-fib ([place])   . Obesity   . OSA (obstructive sleep apnea)     on CPAP  . Chronic systolic CHF (congestive heart failure) ([place])   . Hypertension   . Hyperlipidemia   . History of kidney stones   . Osteoarthritis     bilateral knees  . Depression   . History of pulmonary embolism   . [place] spotted fever 2011    Past Surgical History  Procedure Laterality Date  . Lithotripsy  07/31/2014  . Knee surgery      right knee   . Ureteroscopy    . Ureteral stent placement  2016  . Cardiac catheterization  2012    [facility]    Current Outpatient Prescriptions  Medication Sig Dispense Refill  . acetaminophen (TYLENOL) 500 MG tablet Take 1,000 mg by mouth every 6 (six) hours as needed.    Marland Kitchen apixaban (ELIQUIS) 5 MG TABS tablet Take 1 tablet (5 mg total) by mouth 2 (two) times daily. 180 tablet 3  . digoxin (LANOXIN) 0.25 MG tablet Take 1 tablet (0.25 mg total) by mouth daily. 90 tablet 3  . lisinopril (PRINIVIL,ZESTRIL) 5 MG tablet Take 1/2 tablet (2.5 mg) by mouth once daily    . metoprolol succinate (TOPROL-XL) 100 MG 24 hr tablet Take 150 mg by mouth daily. Take with or immediately following a meal.      . Multiple Vitamins-Minerals (CENTRUM SILVER ADULT 50+) TABS Take 1 tablet by mouth daily.    . pravastatin (PRAVACHOL) 80 MG tablet Take 1 tablet (80 mg total) by mouth every evening. 90 tablet 3  . traMADol (ULTRAM) 50 MG tablet Take 1 tablet (50 mg total) by mouth every 6 (six) hours as needed. 30 tablet   . vitamin C (ASCORBIC ACID) 500 MG tablet Take 1 tablet (500 mg total) by mouth daily.     No current facility-administered medications for this visit.    Allergies  Allergen Reactions  . Allopurinol Hives and Swelling  . Diltiazem Itching and Rash  . Other Hives  . Penicillin G Nausea Only    And dirrhea  . Penicillins Other (See Comments)    Pt unsure of rxn; showed up on allergy test  . 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   Daytime heart rate is in the 80s or 90s apart from the first 3 hours in the morning where his heart rate runs 110 or so. The are cases a.m. medicines pretty early we will shift his metoprolol dosing from 150 twice a day--200/100.  His dig level was borderline elevated at 1.1.  Last potassium level was 4.2 albeit 5/16. We will plan to repeat it when he returns in 6 weeks for his echocardiogram.  Given his improved heart rate control exercise tolerance, I hope that we  will find LV function improved. \ We have also reviewed the importance of exercise. He is swimming and doing chair aerobics. I have suggested that he consider an exercise bicycle

## 2015-07-14 NOTE — Patient Instructions (Signed)
Medication Instructions: 1) Toprol (metoprolol succinate) 100 mg - take two tablets (200 mg) in the morning and one tablet (100 mg) in the evening  Labwork: - none   Procedures/Testing: - Your physician has requested that you have an echocardiogram- in 6 weeks. Echocardiography is a painless test that uses sound waves to create images of your heart. It provides your doctor with information about the size and shape of your heart and how well your heart's chambers and valves are working. This procedure takes approximately one hour. There are no restrictions for this procedure.  Follow-Up: - Your physician recommends that you schedule a follow-up appointment in: 7 weeks with Dr. Caryl Comes or after your echo is completed  Any Additional Special Instructions Will Be Listed Below (If Applicable).     If you need a refill on your cardiac medications before your next appointment, please call your pharmacy.

## 2015-08-25 ENCOUNTER — Other Ambulatory Visit: Payer: Self-pay

## 2015-08-25 ENCOUNTER — Ambulatory Visit (INDEPENDENT_AMBULATORY_CARE_PROVIDER_SITE_OTHER): Payer: Medicare HMO

## 2015-08-25 DIAGNOSIS — I4891 Unspecified atrial fibrillation: Secondary | ICD-10-CM | POA: Diagnosis not present

## 2015-08-26 NOTE — Progress Notes (Signed)
Patient Care Team: [name], MD as PCP - General (Pediatrics)   HPI  Carlos Crosby is a 60 y.o. male Seen in follow-up because of persistent atrial fibrillation dating back to 2014. He declined catheter ablation at [facility] at that time. A strategy of rate control had been pursued; it has been largely unsuccessful. Interval assessment of left ventricular systolic function has demonstrated significant worsening with an ejection fraction of 30-35%. This was 12/16. Repeat echocardiogram last week demonstrates no interval improvement.  wE HAVE been aggressive in trying to reduce HR with recent holter 2/17 HR avg 93 this was decreased from a resting rate of 130 when he first appeared in the office.   Records and Results Reviewed  HOLTER 12/16   Mean92   46--159   Daytime avg >100 Nighttime avg about 76; echo   Repeat holter  07/08/14   Mean 93   52-185  Daytime Average 80-90 x first three hours  His breathing is better. He has no edema. His major limitation now is his knees  Past Medical History  Diagnosis Date  . Chronic a-fib ([place])   . Obesity   . OSA (obstructive sleep apnea)     on CPAP  . Chronic systolic CHF (congestive heart failure) ([place])   . Hypertension   . Hyperlipidemia   . History of kidney stones   . Osteoarthritis     bilateral knees  . Depression   . History of pulmonary embolism   . Rocky [place] spotted fever 2011    Past Surgical History  Procedure Laterality Date  . Lithotripsy  07/31/2014  . Knee surgery      right knee   . Ureteroscopy    . Ureteral stent placement  2016  . Cardiac catheterization  2012    [facility]    Current Outpatient Prescriptions  Medication Sig Dispense Refill  . acetaminophen (TYLENOL) 500 MG tablet Take 1,000 mg by mouth every 6 (six) hours as needed.    Marland Kitchen apixaban (ELIQUIS) 5 MG TABS tablet Take 1 tablet (5 mg total) by mouth 2 (two) times daily. 180 tablet 3  . digoxin (LANOXIN) 0.25 MG tablet Take 1 tablet (0.25 mg total)  by mouth daily. 90 tablet 3  . lisinopril (PRINIVIL,ZESTRIL) 5 MG tablet Take 1/2 tablet (2.5 mg) by mouth once daily    . metoprolol succinate (TOPROL-XL) 100 MG 24 hr tablet Take two tablets (200 mg) in the morning & take one tablet (100 mg) in the evening    . Multiple Vitamins-Minerals (CENTRUM SILVER ADULT 50+) TABS Take 1 tablet by mouth daily.    . pravastatin (PRAVACHOL) 80 MG tablet Take 1 tablet (80 mg total) by mouth every evening. 90 tablet 3  . traMADol (ULTRAM) 50 MG tablet Take 1 tablet (50 mg total) by mouth every 6 (six) hours as needed. 30 tablet   . vitamin C (ASCORBIC ACID) 500 MG tablet Take 1 tablet (500 mg total) by mouth daily.     No current facility-administered medications for this visit.    Allergies  Allergen Reactions  . Allopurinol Hives and Swelling  . Diltiazem Itching and Rash  . Other Hives  . Penicillin G Nausea Only    And dirrhea  . Penicillins Other (See Comments)    Pt unsure of rxn; showed up on allergy test  . 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 His Holter monitor from a month ago was reviewed again. It shows average heart rates always above 90. I wonder whether there isn't a further  heart rate decrease that might help [place] accomplished  improve LV function. Hence, we will put him on low-dose verapamil, not withstanding the fact that his LV function is modestly depressed. We are aware of his history of allergy to diltiazem  We will plan to reassess his heart rate excursion with a Holter monitor in about 2 months. He will use his [employer] monitor and let us know if he has significant bradycardia i.e. heart rates below 60   More than 50% of 45 min was spent in counseling related to the above

## 2015-08-27 ENCOUNTER — Ambulatory Visit (INDEPENDENT_AMBULATORY_CARE_PROVIDER_SITE_OTHER): Payer: Medicare HMO | Admitting: Internal Medicine

## 2015-08-27 ENCOUNTER — Encounter: Payer: Self-pay | Admitting: Internal Medicine

## 2015-08-27 VITALS — HR 93 | Ht 72.0 in | Wt 338.0 lb

## 2015-08-27 DIAGNOSIS — I4891 Unspecified atrial fibrillation: Secondary | ICD-10-CM

## 2015-08-27 DIAGNOSIS — I5032 Chronic diastolic (congestive) heart failure: Secondary | ICD-10-CM | POA: Diagnosis not present

## 2015-08-27 DIAGNOSIS — I10 Essential (primary) hypertension: Secondary | ICD-10-CM

## 2015-08-27 MED ORDER — VERAPAMIL HCL ER 120 MG PO TBCR: 120.0000 mg | EXTENDED_RELEASE_TABLET | Freq: Every day | ORAL | Status: DC

## 2015-08-27 NOTE — Patient Instructions (Signed)
Medication Instructions: 1) Start Verapamil 120 mg once daily  Labwork: - none  Procedures/Testing: - Your physician has recommended that you wear a 24 hour holter monitor- in 2 months. Holter monitors are medical devices that record the heart's electrical activity. Doctors most often use these monitors to diagnose arrhythmias. Arrhythmias are problems with the speed or rhythm of the heartbeat. The monitor is a small, portable device. You can wear one while you do your normal daily activities. This is usually used to diagnose what is causing palpitations/syncope (passing out).  Follow-Up: - Your physician recommends that you schedule a follow-up appointment in: 3 months with Dr. Caryl Comes.  Any Additional Special Instructions Will Be Listed Below (If Applicable).     If you need a refill on your cardiac medications before your next appointment, please call your pharmacy.

## 2015-08-31 ENCOUNTER — Other Ambulatory Visit: Payer: Self-pay | Admitting: *Deleted

## 2015-08-31 ENCOUNTER — Encounter: Payer: Self-pay | Admitting: Internal Medicine

## 2015-08-31 MED ORDER — METOPROLOL SUCCINATE ER 100 MG PO TB24: ORAL_TABLET | ORAL | Status: DC

## 2015-09-02 ENCOUNTER — Other Ambulatory Visit: Payer: Self-pay | Admitting: *Deleted

## 2015-09-02 ENCOUNTER — Encounter: Payer: Self-pay | Admitting: Internal Medicine

## 2015-09-02 MED ORDER — VERAPAMIL HCL ER 120 MG PO TBCR: 120.0000 mg | EXTENDED_RELEASE_TABLET | Freq: Every day | ORAL | Status: DC

## 2015-09-02 MED ORDER — LISINOPRIL 2.5 MG PO TABS: 2.5000 mg | ORAL_TABLET | Freq: Every day | ORAL | Status: DC

## 2015-09-04 ENCOUNTER — Telehealth: Payer: Self-pay

## 2015-09-04 NOTE — Telephone Encounter (Signed)
Prior auth received for Metoprolol XL 100mg ( 2 in am, 1 in pm) through [facility].

## 2015-09-07 ENCOUNTER — Telehealth: Payer: Self-pay

## 2015-09-07 NOTE — Telephone Encounter (Signed)
Metoprolol ER 100mg  approved by [facility]. Good through 09/04/2016.

## 2015-09-08 ENCOUNTER — Encounter: Payer: Self-pay | Admitting: Internal Medicine

## 2015-09-14 ENCOUNTER — Encounter: Payer: Self-pay | Admitting: Internal Medicine

## 2015-10-23 ENCOUNTER — Telehealth: Payer: Self-pay

## 2015-10-23 NOTE — Telephone Encounter (Signed)
Patient aware of having the holter monitor placed at [facility]. Patient understands instructions and doesn't have a problem going to the [place] location. Order and demographic faxed to [facility].

## 2015-10-27 ENCOUNTER — Ambulatory Visit (INDEPENDENT_AMBULATORY_CARE_PROVIDER_SITE_OTHER): Payer: Medicare HMO

## 2015-10-27 DIAGNOSIS — I4891 Unspecified atrial fibrillation: Secondary | ICD-10-CM

## 2015-10-28 ENCOUNTER — Telehealth: Payer: Self-pay

## 2015-10-28 NOTE — Telephone Encounter (Signed)
Holter monitor will be placed on Oct 27, 2015 at [facility].

## 2015-10-28 NOTE — Telephone Encounter (Signed)
Noted  

## 2015-10-28 NOTE — Telephone Encounter (Signed)
-----   Message from Emily Filbert, RN sent at 10/27/2015  5:56 PM EDT ----- Carlos Levering,  Any idea when this patient was going to have his monitor placed at [facility]?

## 2015-11-04 NOTE — Telephone Encounter (Signed)
FYI [facility] [id number]  Carlos Crosby  has faxed results and report shows afib aflutter

## 2015-11-09 ENCOUNTER — Encounter: Payer: Self-pay | Admitting: Internal Medicine

## 2015-11-10 ENCOUNTER — Telehealth: Payer: Self-pay | Admitting: Cardiovascular Disease

## 2015-11-10 ENCOUNTER — Telehealth: Payer: Self-pay | Admitting: Internal Medicine

## 2015-11-10 ENCOUNTER — Other Ambulatory Visit: Payer: Self-pay | Admitting: *Deleted

## 2015-11-10 DIAGNOSIS — I480 Paroxysmal atrial fibrillation: Secondary | ICD-10-CM

## 2015-11-10 NOTE — Telephone Encounter (Signed)
Attempted to schedule fu from recall.  Patient is scheduled to see klein and was told he would communicate with gollan.  Patient does not feel he needs to fu with gollan.   Deleting recall.

## 2015-11-10 NOTE — Telephone Encounter (Signed)
My Chart message sent to the patient earlier. Message back that he did receive this.

## 2015-11-10 NOTE — Telephone Encounter (Signed)
Pt would like monitor results. Please call. 

## 2015-11-24 ENCOUNTER — Other Ambulatory Visit: Payer: Self-pay

## 2015-11-24 ENCOUNTER — Ambulatory Visit (INDEPENDENT_AMBULATORY_CARE_PROVIDER_SITE_OTHER): Payer: Medicare HMO

## 2015-11-24 DIAGNOSIS — I480 Paroxysmal atrial fibrillation: Secondary | ICD-10-CM | POA: Diagnosis not present

## 2015-11-24 LAB — ECHOCARDIOGRAM COMPLETE
Ao-asc: 35 cm
E decel time: 218 msec
E/e' ratio: 7.19
FS: 25 % — AB (ref 28–44)
IVS/LV PW RATIO, ED: 0.98
LA ID, A-P, ES: 52 mm
LA diam end sys: 52 mm
LA diam index: 1.81 cm/m2
LA vol A4C: 99.8 ml
LA vol index: 38.7 mL/m2
LA vol: 111 mL
LV E/e' medial: 7.19
LV E/e'average: 7.19
LV PW d: 12.9 mm — AB (ref 0.6–1.1)
LV e' LATERAL: 11.2 cm/s
LVOT SV: 68 mL
LVOT VTI: 18 cm
LVOT area: 3.8 cm2
LVOT diameter: 22 mm
LVOT peak vel: 96.1 cm/s
MV Dec: 218
MV Peak grad: 3 mmHg
MV pk E vel: 80.5 m/s
TAPSE: 19.7 mm
TDI e' lateral: 11.2
TDI e' medial: 10.1

## 2015-12-03 ENCOUNTER — Encounter: Payer: Self-pay | Admitting: Internal Medicine

## 2015-12-03 ENCOUNTER — Ambulatory Visit (INDEPENDENT_AMBULATORY_CARE_PROVIDER_SITE_OTHER): Payer: Medicare HMO | Admitting: Internal Medicine

## 2015-12-03 VITALS — BP 120/80 | HR 92 | Ht 72.0 in | Wt 335.5 lb

## 2015-12-03 DIAGNOSIS — I481 Persistent atrial fibrillation: Secondary | ICD-10-CM

## 2015-12-03 DIAGNOSIS — I4819 Other persistent atrial fibrillation: Secondary | ICD-10-CM

## 2015-12-03 DIAGNOSIS — R079 Chest pain, unspecified: Secondary | ICD-10-CM | POA: Diagnosis not present

## 2015-12-03 DIAGNOSIS — I4891 Unspecified atrial fibrillation: Secondary | ICD-10-CM

## 2015-12-03 DIAGNOSIS — I429 Cardiomyopathy, unspecified: Secondary | ICD-10-CM | POA: Diagnosis not present

## 2015-12-03 NOTE — Patient Instructions (Addendum)
Medication Instructions: - Your physician recommends that you continue on your current medications as directed. Please refer to the Current Medication list given to you today.  Labwork: - Your physician recommends that you have lab work today: Digoxin levle   Procedures/Testing: - Your physician has requested that you have a Lexicographer  (2-day protocol) - [place]. For further information please visit [email or web address]. Please follow instruction sheet, as given.  Prior to stress test: - nothing to eat/ drink (except for water) for 2 hours prior - you may take your medications that morning - no colognes / lotions that day - wear a short sleeve shirt   Follow-Up: - Your physician wants you to follow-up in: 6 months with Dr. Caryl Comes. You will receive a reminder letter in the mail two months in advance. If you don't receive a letter, please call our office to schedule the follow-up appointment.  Any Additional Special Instructions Will Be Listed Below (If Applicable).     If you need a refill on your cardiac medications before your next appointment, please call your pharmacy.

## 2015-12-03 NOTE — Progress Notes (Signed)
Patient Care Team: [name], MD as PCP - General (Pediatrics)   HPI  Carlos Crosby is a 60 y.o. male Seen in follow-up because of persistent atrial fibrillation dating back to 2014. He declined catheter ablation at [facility] at that time. A strategy of rate control had been pursued; it has been largely unsuccessful. Interval assessment of left ventricular systolic function has demonstrated significant worsening with an ejection fraction of 30-35%. This was 12/16. Repeat echocardiogram last week demonstrates no interval improvement.  wE HAVE been aggressive in trying to reduce HR with recent holter 2/17 HR avg 93 this was decreased from a resting rate of 130 when he first appeared in the office.   Records and Results Reviewed  HOLTER 12/16   Mean92   46--159   Daytime avg >100 Nighttime avg about 76;    Repeat holter  07/08/14   Mean 93   52-185  Daytime Average 80-90 x first three hours  Repeat Holter 5/17 mean heart rate 75. Repeat echocardiogram 6/17 EF 45-50%.  His breathing is better. He has no edema. His major limitation now is his knees  he is swimming.  He describes it as a series of episodes of chest discomfort lasting 3-5 minutes. They are left neck and left anterior chest and are crescendo. They are unassociated with exertion.  Past Medical History  Diagnosis Date  . Chronic a-fib ([place])   . Obesity   . OSA (obstructive sleep apnea)     on CPAP  . Chronic systolic CHF (congestive heart failure) ([place])   . Hypertension   . Hyperlipidemia   . History of kidney stones   . Osteoarthritis     bilateral knees  . Depression   . History of pulmonary embolism   . Rocky Mountain spotted fever 2011    Past Surgical History  Procedure Laterality Date  . Lithotripsy  07/31/2014  . Knee surgery      right knee   . Ureteroscopy    . Ureteral stent placement  2016  . Cardiac catheterization  2012    [facility]    Current Outpatient Prescriptions  Medication Sig Dispense Refill   . acetaminophen (TYLENOL) 500 MG tablet Take 1,000 mg by mouth every 6 (six) hours as needed.    Marland Kitchen apixaban (ELIQUIS) 5 MG TABS tablet Take 1 tablet (5 mg total) by mouth 2 (two) times daily. 180 tablet 3  . digoxin (LANOXIN) 0.25 MG tablet Take 1 tablet (0.25 mg total) by mouth daily. 90 tablet 3  . furosemide (LASIX) 40 MG tablet Take 40 mg by mouth daily.    Marland Kitchen lisinopril (PRINIVIL,ZESTRIL) 2.5 MG tablet Take 1 tablet (2.5 mg total) by mouth daily. 90 tablet 3  . metoprolol succinate (TOPROL-XL) 100 MG 24 hr tablet Take two tablets (200 mg) in the morning & take one tablet (100 mg) in the evening 270 tablet 3  . Multiple Vitamins-Minerals (CENTRUM SILVER ADULT 50+) TABS Take 1 tablet by mouth daily.    . pravastatin (PRAVACHOL) 80 MG tablet Take 1 tablet (80 mg total) by mouth every evening. 90 tablet 3  . traMADol (ULTRAM) 50 MG tablet Take 1 tablet (50 mg total) by mouth every 6 (six) hours as needed. 30 tablet   . verapamil (CALAN-SR) 120 MG CR tablet Take 1 tablet (120 mg total) by mouth daily. 90 tablet 3  . vitamin C (ASCORBIC ACID) 500 MG tablet Take 1 tablet (500 mg total) by mouth daily.  No current facility-administered medications for this visit.    Allergies  Allergen Reactions  . Allopurinol Hives and Swelling  . Diltiazem Itching and Rash  . Other Hives  . Penicillin G Nausea Only    And dirrhea  . Penicillins Other (See Comments)    Pt unsure of rxn; showed up on allergy test  . Ondansetron Rash      Review of Systems negative except from HPI and PMH  Physical Exam BP 120/80 mmHg  Pulse 92  Ht 6' (1.829 m)  Wt 335 lb 8 oz (152.182 kg)  BMI 45.49 kg/m2 Well developed and well nourished in no acute distress HENT normal E scleral and icterus clear Neck Supple JVP flat; carotids brisk and full Clear to ausculation irregularly irregular   no murmurs gallops or rub Soft with active bowel sounds No clubbing cyanosis 1tr+ddema Alert and oriented, grossly  normal motor and sensory function Skin Warm and Dry  ECG  Afib 92 -/09/36  Assessment and  Plan   Afib  now controlled ventricular response  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 We will continue our current therapies.  His chest pain, while atypical, is concerning for his heart. There have been no recent stress tests in our records or those from [facility]. We'll undertake a Myoview scan-2 day protocol.  We will check a digoxin level

## 2015-12-04 ENCOUNTER — Other Ambulatory Visit: Payer: Self-pay | Admitting: *Deleted

## 2015-12-04 LAB — DIGOXIN LEVEL: Digoxin, Serum: 1.1 ng/mL — ABNORMAL HIGH (ref 0.5–0.9)

## 2015-12-04 MED ORDER — DIGOXIN 250 MCG PO TABS: ORAL_TABLET | ORAL | Status: DC

## 2015-12-16 ENCOUNTER — Telehealth (HOSPITAL_COMMUNITY): Payer: Self-pay | Admitting: *Deleted

## 2015-12-16 NOTE — Telephone Encounter (Signed)
Left message on voicemail per DPR in reference to upcoming appointment scheduled on 12/21/15 at 1245 with detailed instructions given per Myocardial Perfusion Study Information Sheet for the test. LM to arrive 15 minutes early, and that it is imperative to arrive on time for appointment to keep from having the test rescheduled. If you need to cancel or reschedule your appointment, please call the office within 24 hours of your appointment. Failure to do so may result in a cancellation of your appointment, and a $50 no show fee. Phone number given for call back for any questions. Lisanne Ponce, Ranae Palms

## 2015-12-21 ENCOUNTER — Ambulatory Visit (HOSPITAL_COMMUNITY): Payer: Medicare HMO | Attending: Internal Medicine

## 2015-12-21 DIAGNOSIS — R079 Chest pain, unspecified: Secondary | ICD-10-CM | POA: Insufficient documentation

## 2015-12-21 DIAGNOSIS — R9439 Abnormal result of other cardiovascular function study: Secondary | ICD-10-CM | POA: Diagnosis not present

## 2015-12-21 DIAGNOSIS — M79603 Pain in arm, unspecified: Secondary | ICD-10-CM | POA: Insufficient documentation

## 2015-12-21 DIAGNOSIS — I4891 Unspecified atrial fibrillation: Secondary | ICD-10-CM | POA: Insufficient documentation

## 2015-12-21 DIAGNOSIS — I119 Hypertensive heart disease without heart failure: Secondary | ICD-10-CM | POA: Diagnosis not present

## 2015-12-21 MED ORDER — REGADENOSON 0.4 MG/5ML IV SOLN
0.4000 mg | Freq: Once | INTRAVENOUS | Status: AC
  Administered 2015-12-21: 0.4 mg via INTRAVENOUS

## 2015-12-21 MED ORDER — TECHNETIUM TC 99M TETROFOSMIN IV KIT
32.5000 | PACK | Freq: Once | INTRAVENOUS | Status: AC | PRN
  Administered 2015-12-21: 33 via INTRAVENOUS
  Filled 2015-12-21: qty 33

## 2015-12-22 ENCOUNTER — Ambulatory Visit (HOSPITAL_COMMUNITY): Payer: Medicare HMO | Attending: Internal Medicine

## 2015-12-22 LAB — MYOCARDIAL PERFUSION IMAGING
LV dias vol: 164 mL (ref 62–150)
LV sys vol: 95 mL
Peak HR: 139 {beats}/min
RATE: 0.37
Rest HR: 105 {beats}/min
SDS: 3
SRS: 8
SSS: 11
TID: 1.05

## 2015-12-22 MED ORDER — TECHNETIUM TC 99M TETROFOSMIN IV KIT
32.5000 | PACK | Freq: Once | INTRAVENOUS | Status: AC | PRN
  Administered 2015-12-22: 33 via INTRAVENOUS
  Filled 2015-12-22: qty 33

## 2015-12-23 ENCOUNTER — Encounter: Payer: Self-pay | Admitting: *Deleted

## 2016-01-25 ENCOUNTER — Encounter: Payer: Self-pay | Admitting: Internal Medicine

## 2016-01-26 ENCOUNTER — Other Ambulatory Visit: Payer: Self-pay | Admitting: *Deleted

## 2016-01-26 MED ORDER — APIXABAN 5 MG PO TABS: 5.0000 mg | ORAL_TABLET | Freq: Two times a day (BID) | ORAL | 2 refills | Status: DC

## 2016-01-27 ENCOUNTER — Encounter: Payer: Self-pay | Admitting: Cardiovascular Disease

## 2016-03-25 ENCOUNTER — Ambulatory Visit: Payer: Medicare HMO | Admitting: Cardiovascular Disease

## 2016-03-28 ENCOUNTER — Encounter: Payer: Self-pay | Admitting: Cardiovascular Disease

## 2016-03-28 ENCOUNTER — Ambulatory Visit (INDEPENDENT_AMBULATORY_CARE_PROVIDER_SITE_OTHER): Payer: Medicare HMO | Admitting: Cardiovascular Disease

## 2016-03-28 VITALS — BP 120/72 | HR 113 | Ht 71.0 in | Wt 340.5 lb

## 2016-03-28 DIAGNOSIS — F419 Anxiety disorder, unspecified: Secondary | ICD-10-CM

## 2016-03-28 DIAGNOSIS — I10 Essential (primary) hypertension: Secondary | ICD-10-CM | POA: Diagnosis not present

## 2016-03-28 DIAGNOSIS — I5032 Chronic diastolic (congestive) heart failure: Secondary | ICD-10-CM

## 2016-03-28 DIAGNOSIS — I482 Chronic atrial fibrillation: Secondary | ICD-10-CM | POA: Diagnosis not present

## 2016-03-28 DIAGNOSIS — Z9989 Dependence on other enabling machines and devices: Secondary | ICD-10-CM

## 2016-03-28 DIAGNOSIS — E7800 Pure hypercholesterolemia, unspecified: Secondary | ICD-10-CM | POA: Diagnosis not present

## 2016-03-28 DIAGNOSIS — G4733 Obstructive sleep apnea (adult) (pediatric): Secondary | ICD-10-CM

## 2016-03-28 DIAGNOSIS — I4821 Permanent atrial fibrillation: Secondary | ICD-10-CM

## 2016-03-28 NOTE — Patient Instructions (Addendum)
Medication Instructions:   No medication changes made  Samples of eliquis  Labwork:  No new labs needed  Testing/Procedures:  No further testing at this time  Follow up with Dr. Caryl Comes in 6 months   Follow-Up: It was a pleasure seeing you in the office today. Please call us if you have new issues that need to be addressed before your next appt.  [phone]  Your physician wants you to follow-up in: 12 months.  You will receive a reminder letter in the mail two months in advance. If you don't receive a letter, please call our office to schedule the follow-up appointment.  If you need a refill on your cardiac medications before your next appointment, please call your pharmacy.

## 2016-03-28 NOTE — Progress Notes (Signed)
Cardiology Office Note  Date:  03/28/2016   ID:  HIEU RIEKEN, DOB Oct 24, 1955, MRN [id number]  PCP:  [name], MD   Chief Complaint  Patient presents with  . other    3 month f/u. "doing well."     HPI:  [name] is a 60 year old gentleman with morbid obesity, chronic atrial fibrillation (previously on tikosyn), chronic systolic CHF with ejection fraction 25% in 2012, cardiac cath: no CAD,  obstructive sleep apnea on CPAP, hypertension, hyperlipidemia, recurrent renal stones, osteoarthritis of the knees bilaterally,  depression who presents for routine follow-up of his atrial fibrillation  Previously noted to have elevated ventricular rate in the setting of anxiety, such as before procedures  In follow-up he reports that he feels well with no complaints. Previous concerned about elevated ventricular rates him a reports that heart rate at home typically 70-80 bpm, always seems to be elevated in the office out of proportion to his home numbers  Denies any significant leg swelling, weight gain Takes Lasix 40 mg daily when necessary with potassium for leg swelling  Previous and recent studies reviewed with him ECHO 11/24/2015: EF 45 to 50% Stress test : 12/21/2015 No ischemia, EF 42% echo in 05/2015: EF 30 to 35%  Labs: LDL 65, total 147 Digoxin 1.1 (now misses a dose once a day)  Goes to pool work up 5 days per week Weight up 5 pounds?  EKG on today's visit shows atrial fibrillation with ventricular rate 113 bpm, nonspecific T wave abnormality   Other past medical history reviewed outpatient left lithotripsy 08/28/2014  found to have atrial fibrillation with RVR with heart rates up to 170s prior to the procedure and this was canceled. He was kept overnight in the hospital and heart rate returned to normal on his regular medication regiment, metoprolol succinate 100 mg twice a day  As an outpatient, he went repeat lithotripsy with Dr. Eliberto Ivory several weeks ago and was  given Xanax the night before, morning of the procedure and extra metoprolol succinate 50 mg the night before is the morning of the procedure. He reports that his heart rate was well-controlled, Dr. Eliberto Ivory attempted the lithotripsy but was unsuccessful Lithotripsy repeated at a later date Recent removal of stent in the left urethra  PMH:   has a past medical history of Chronic a-fib ([place]); Chronic systolic CHF (congestive heart failure) ([place]); Depression; History of kidney stones; History of pulmonary embolism; Hyperlipidemia; Hypertension; Obesity; OSA (obstructive sleep apnea); Osteoarthritis; and [facility] spotted fever (2011).  PSH:    Past Surgical History:  Procedure Laterality Date  . CARDIAC CATHETERIZATION  2012   [facility]  . KNEE SURGERY     right knee   . LITHOTRIPSY  07/31/2014  . URETERAL STENT PLACEMENT  2016  . URETEROSCOPY      Current Outpatient Prescriptions  Medication Sig Dispense Refill  . acetaminophen (TYLENOL) 500 MG tablet Take 1,000 mg by mouth every 6 (six) hours as needed.    Marland Kitchen apixaban (ELIQUIS) 5 MG TABS tablet Take 1 tablet (5 mg total) by mouth 2 (two) times daily. 180 tablet 2  . digoxin (LANOXIN) 0.25 MG tablet Take one tablet (0.25 mg) by mouth once daily six days a week    . furosemide (LASIX) 40 MG tablet Take 40 mg by mouth daily.    Marland Kitchen lisinopril (PRINIVIL,ZESTRIL) 2.5 MG tablet Take 1 tablet (2.5 mg total) by mouth daily. 90 tablet 3  . metoprolol succinate (TOPROL-XL) 100 MG 24  hr tablet Take two tablets (200 mg) in the morning & take one tablet (100 mg) in the evening 270 tablet 3  . Multiple Vitamins-Minerals (CENTRUM SILVER ADULT 50+) TABS Take 1 tablet by mouth daily.    . pravastatin (PRAVACHOL) 80 MG tablet Take 1 tablet (80 mg total) by mouth every evening. 90 tablet 3  . traMADol (ULTRAM) 50 MG tablet Take 1 tablet (50 mg total) by mouth every 6 (six) hours as needed. 30 tablet   . verapamil (CALAN-SR) 120 MG CR tablet Take 1 tablet (120 mg  total) by mouth daily. 90 tablet 3   No current facility-administered medications for this visit.      Allergies:   Allopurinol; Diltiazem; Other; Penicillin g; Penicillins; and Ondansetron   Social History:  The patient  reports that he has never smoked. He does not have any smokeless tobacco history on file. He reports that he does not drink alcohol or use drugs.   Family History:   Family history is unknown by patient.    Review of Systems: Review of Systems  Constitutional: Negative.   Respiratory: Negative.   Cardiovascular: Negative.   Gastrointestinal: Negative.   Musculoskeletal: Positive for joint pain.  Neurological: Negative.   Psychiatric/Behavioral: Negative.   All other systems reviewed and are negative.    PHYSICAL EXAM: VS:  BP 120/72 (BP Location: Left Arm, Patient Position: Sitting, Cuff Size: Large)   Pulse (!) 113   Ht 5\' 11"  (1.803 m)   Wt (!) 340 lb 8 oz (154.4 kg)   BMI 47.49 kg/m  , BMI Body mass index is 47.49 kg/m. GEN: Well nourished, well developed, in no acute distress, obese  HEENT: normal  Neck: no JVD, carotid bruits, or masses Cardiac: Irregularly irregular, tachycardic, no murmurs, rubs, or gallops,no edema  Respiratory:  clear to auscultation bilaterally, normal work of breathing GI: soft, nontender, nondistended, + BS MS: no deformity or atrophy  Skin: warm and dry, no rash Neuro:  Strength and sensation are intact Psych: euthymic mood, full affect    Recent Labs: 05/14/2015: TSH 2.161    Lipid Panel No results found for: CHOL, HDL, LDLCALC, TRIG    Wt Readings from Last 3 Encounters:  03/28/16 (!) 340 lb 8 oz (154.4 kg)  12/03/15 (!) 335 lb 8 oz (152.2 kg)  08/27/15 (!) 338 lb (153.3 kg)       ASSESSMENT AND PLAN:  Permanent atrial fibrillation (HCC) Heart rate typically elevated in Dr. Visits, though he reports much improved at home No changes to his medications at this time, recommended he closely monitor her  heart rate at home  Essential hypertension Blood pressure is well controlled on today's visit. No changes made to the medications.  Pure hypercholesterolemia Cholesterol is at goal on the current lipid regimen. No changes to the medications were made.  Morbid obesity ([place]) We have encouraged continued exercise, careful diet management in an effort to lose weight.  Anxiety Typically needs benzodiazepines, extra metoprolol prior to procedures in an effort to reduce episodes of tachycardia  Obstructive sleep apnea on CPAP Recommended he stay compliant on his CPAP  Chronic diastolic congestive heart failure (HCC) We'll continue Lasix as needed for leg swelling   Total encounter time more than 25 minutes  Greater than 50% was spent in counseling and coordination of care with the patient   Disposition:   F/U  6 months    Signed, Esmond Plants, M.D., Ph.D. 03/28/2016  [facility] [place], [place]  [phone]  

## 2016-03-31 NOTE — Addendum Note (Signed)
Addended by: Anselm Pancoast on: 03/31/2016 07:56 AM   Modules accepted: Orders

## 2016-04-05 ENCOUNTER — Encounter: Payer: Self-pay | Admitting: Internal Medicine

## 2016-04-06 ENCOUNTER — Other Ambulatory Visit: Payer: Self-pay | Admitting: *Deleted

## 2016-04-06 MED ORDER — METOPROLOL SUCCINATE ER 100 MG PO TB24: ORAL_TABLET | ORAL | 0 refills | Status: DC

## 2016-04-22 DIAGNOSIS — G4730 Sleep apnea, unspecified: Secondary | ICD-10-CM | POA: Diagnosis not present

## 2016-04-22 DIAGNOSIS — G4733 Obstructive sleep apnea (adult) (pediatric): Secondary | ICD-10-CM | POA: Diagnosis not present

## 2016-04-25 ENCOUNTER — Encounter: Payer: Self-pay | Admitting: Internal Medicine

## 2016-04-26 ENCOUNTER — Telehealth: Payer: Self-pay | Admitting: *Deleted

## 2016-04-26 NOTE — Telephone Encounter (Signed)
Carlos Crosby 5 mg samples placed at front desk for pick up.

## 2016-05-02 DIAGNOSIS — M170 Bilateral primary osteoarthritis of knee: Secondary | ICD-10-CM | POA: Diagnosis not present

## 2016-05-04 DIAGNOSIS — G4730 Sleep apnea, unspecified: Secondary | ICD-10-CM | POA: Diagnosis not present

## 2016-05-04 DIAGNOSIS — G4733 Obstructive sleep apnea (adult) (pediatric): Secondary | ICD-10-CM | POA: Diagnosis not present

## 2016-05-10 ENCOUNTER — Encounter: Payer: Self-pay | Admitting: Cardiovascular Disease

## 2016-05-16 ENCOUNTER — Ambulatory Visit (INDEPENDENT_AMBULATORY_CARE_PROVIDER_SITE_OTHER): Payer: Medicare HMO | Admitting: Family Medicine

## 2016-05-16 ENCOUNTER — Encounter: Payer: Self-pay | Admitting: Family Medicine

## 2016-05-16 VITALS — BP 132/82 | HR 92 | Temp 97.5°F | Ht 71.0 in | Wt 340.2 lb

## 2016-05-16 DIAGNOSIS — L659 Nonscarring hair loss, unspecified: Secondary | ICD-10-CM

## 2016-05-16 DIAGNOSIS — K429 Umbilical hernia without obstruction or gangrene: Secondary | ICD-10-CM

## 2016-05-16 DIAGNOSIS — D229 Melanocytic nevi, unspecified: Secondary | ICD-10-CM

## 2016-05-16 DIAGNOSIS — G4733 Obstructive sleep apnea (adult) (pediatric): Secondary | ICD-10-CM | POA: Diagnosis not present

## 2016-05-16 DIAGNOSIS — I482 Chronic atrial fibrillation: Secondary | ICD-10-CM | POA: Diagnosis not present

## 2016-05-16 DIAGNOSIS — R1031 Right lower quadrant pain: Secondary | ICD-10-CM

## 2016-05-16 DIAGNOSIS — M170 Bilateral primary osteoarthritis of knee: Secondary | ICD-10-CM | POA: Diagnosis not present

## 2016-05-16 DIAGNOSIS — R109 Unspecified abdominal pain: Secondary | ICD-10-CM | POA: Insufficient documentation

## 2016-05-16 DIAGNOSIS — Z9989 Dependence on other enabling machines and devices: Secondary | ICD-10-CM

## 2016-05-16 DIAGNOSIS — D239 Other benign neoplasm of skin, unspecified: Secondary | ICD-10-CM | POA: Insufficient documentation

## 2016-05-16 DIAGNOSIS — I4821 Permanent atrial fibrillation: Secondary | ICD-10-CM

## 2016-05-16 MED ORDER — TRAMADOL HCL 50 MG PO TABS: 50.0000 mg | ORAL_TABLET | Freq: Four times a day (QID) | ORAL | 0 refills | Status: DC | PRN

## 2016-05-16 NOTE — Assessment & Plan Note (Signed)
Compliant with CPAP. Asymptomatic. Continue to monitor.

## 2016-05-16 NOTE — Assessment & Plan Note (Signed)
Rate controlled today. Asymptomatic. Discussed continuing his medications and following up with cardiology. Given return precautions.

## 2016-05-16 NOTE — Assessment & Plan Note (Signed)
Patient has evaluation set up with orthopedics. We will refill tramadol.

## 2016-05-16 NOTE — Assessment & Plan Note (Signed)
Patient with recent hair loss on his body though not his face and head. Appears to be growing back at this time. No apparent rash in these areas. We will refer to dermatology for further evaluation.

## 2016-05-16 NOTE — Progress Notes (Signed)
[name], MD Phone: [phone]  Carlos Crosby is a 60 y.o. male who presents today for new patient visit.  Atrial fibrillation: Patient notes long history of this. Currently on metoprolol, verapamil, and digoxin. Skips one day week on digoxin per his EP specialist. Also taking Eliquis. Notes no chest pain or shortness breath. No palpitations. No issues with bleeding.  OSA: Uses a CPAP nightly. Feels great since starting on this. Wakes well rested. He uses it 7-8 hours a night.  Osteoarthritis: Notes bilateral knees. Follows with orthopedics and they advised that there was nothing else to do for him with regards to injections. He notes he has an appointment with [facility] for follow-up of this. Takes tramadol when the pain is too bad. Only takes this several times a month. Has gotten Synvisc with good benefit.  Patient reports over the last several weeks he lost his body hair. He did not lose any facial hair or hair on his head. Notes it has started to grow back on his arms and chest. He notes no inciting events or stressors.  Patient has a small blue nevus on his scalp. Patient reports his wife has told him there has been a lesion there for some time. Thinks he had it evaluated several years ago. He is unsure what it looks like or if it changed.  Patient notes several weeks ago he felt a pop and a sharp pain in his right lower quadrant. He then had bruising over his right lower quadrant abdominal wall. Notes the pain went away almost immediately. He has not had any pain since then. He notes the bruising has resolved. He had no associated symptoms with this. He does have a history of an umbilical hernia that he states was found on CT scan though has no issues with the hernia staying extruded from his abdomen or pain with in the hernia.  Active Ambulatory Problems    Diagnosis Date Noted  . Atrial fibrillation ([place]) 09/26/2014  . Morbid obesity ([place]) 09/26/2014  . Chronic diastolic  congestive heart failure ([place]) 09/26/2014  . Anxiety 09/26/2014  . Obstructive sleep apnea on CPAP 09/26/2014  . Essential hypertension 09/26/2014  . Hyperlipidemia 09/26/2014  . Osteoarthritis of both knees 09/26/2014  . Kidney stone 09/26/2014  . Blue nevus 05/16/2016  . Hair loss 05/16/2016  . Abdominal pain 05/16/2016  . Umbilical hernia without obstruction and without gangrene 05/16/2016   Resolved Ambulatory Problems    Diagnosis Date Noted  . No Resolved Ambulatory Problems   Past Medical History:  Diagnosis Date  . Chronic a-fib ([place])   . Chronic systolic CHF (congestive heart failure) ([place])   . Depression   . History of kidney stones   . History of pulmonary embolism   . Hyperlipidemia   . Hypertension   . Migraine   . Obesity   . OSA (obstructive sleep apnea)   . Osteoarthritis   . [place] spotted fever 2011    Family History  Problem Relation Age of Onset  . Lung cancer Other     Parent  . Hypertension Other     Parent  . Arthritis Other     Grandparent    Social History   Social History  . Marital status: Married    Spouse name: N/A  . Number of children: N/A  . Years of education: N/A   Occupational History  . Not on file.   Social History Main Topics  . Smoking status: Never Smoker  .  Smokeless tobacco: Never Used  . Alcohol use No  . Drug use: No  . Sexual activity: Not on file   Other Topics Concern  . Not on file   Social History Narrative  . No narrative on file    ROS  General:  Negative for nexplained weight loss, fever Skin: Negative for new or changing mole, sore that won't heal HEENT: Positive for tinnitus, Negative for trouble hearing, trouble seeing, mouth sores, hoarseness, change in voice, dysphagia. CV:  Negative for chest pain, dyspnea, edema, palpitations Resp: Negative for cough, dyspnea, hemoptysis GI: Negative for nausea, vomiting, diarrhea, constipation, abdominal pain, melena, hematochezia. GU:  Negative for dysuria, incontinence, urinary hesitance, hematuria, vaginal or penile discharge, polyuria, sexual difficulty, lumps in testicle or breasts MSK: Positive for muscle cramps or aches, joint pain or swelling Neuro: Negative for headaches, weakness, numbness, dizziness, passing out/fainting Psych: Negative for depression, anxiety, memory problems  Objective  Physical Exam Vitals:   05/16/16 1519  BP: 132/82  Pulse: 92  Temp: 97.5 F (36.4 C)    BP Readings from Last 3 Encounters:  05/16/16 132/82  03/28/16 120/72  12/03/15 120/80   Wt Readings from Last 3 Encounters:  05/16/16 (!) 340 lb 3.2 oz (154.3 kg)  03/28/16 (!) 340 lb 8 oz (154.4 kg)  12/03/15 (!) 335 lb 8 oz (152.2 kg)    Physical Exam  Constitutional: No distress.  HENT:  Head: Normocephalic and atraumatic.  Mouth/Throat: Oropharynx is clear and moist. No oropharyngeal exudate.  Eyes: Conjunctivae are normal. Pupils are equal, round, and reactive to light.  Neck: Neck supple.  Cardiovascular: Normal rate and normal heart sounds.  An irregularly irregular rhythm present.  Pulmonary/Chest: Effort normal and breath sounds normal.  Abdominal: Soft. Bowel sounds are normal. He exhibits no distension. There is no tenderness. There is no rebound and no guarding.  Small umbilical hernia noted with defect in the abdominal wall, fully reducible, nontender, there is no bruising or tenderness in the right lower quadrant, no defects noted in the right lower quadrant, there is no other abdominal tenderness  Musculoskeletal: He exhibits no edema.  Lymphadenopathy:    He has no cervical adenopathy.  Neurological: He is alert.  Skin: Skin is warm and dry. He is not diaphoretic.  About 3 mm blue appearing nevus that is flat with regular borders and uniform color noted on the left posterior crown of his scalp, light colored hair noted on bilateral arms and chest, no underlying rash  Psychiatric: Mood and affect normal.      Assessment/Plan:   Atrial fibrillation ([place]) Rate controlled today. Asymptomatic. Discussed continuing his medications and following up with cardiology. Given return precautions.  Obstructive sleep apnea on CPAP Compliant with CPAP. Asymptomatic. Continue to monitor.  Osteoarthritis of both knees Patient has evaluation set up with orthopedics. We will refill tramadol.  Blue nevus Lesion on scalp appears to be a blue nevus. We will refer to dermatology for further evaluation. Does not have asymmetry, color change, or irregular borders.  Hair loss Patient with recent hair loss on his body though not his face and head. Appears to be growing back at this time. No apparent rash in these areas. We will refer to dermatology for further evaluation.  Abdominal pain Patient with a single episode of right lower quadrant abdominal discomfort several weeks ago. Resolved quickly. Had bruising following this. Given bruising, popping sensation, and quick onset of discomfort suspect musculoskeletal cause. Has benign exam today. He did not have  any other associated symptoms. I do not feel this is related to his hernia as he had no pain in the area of his umbilical hernia. Which is fully reducible today. Given that he is asymptomatic and has not had any recurrence we opted to monitor. If there is recurrence he will let us know.  Umbilical hernia without obstruction and without gangrene Patient with umbilical hernia and abdominal wall defect palpated. Fully reducible. No signs of obstruction or strangulation. Offered referral to general surgery for evaluation which he declined at this time. Discussed hernia return precautions. Continue to monitor at this time and when he decides on seeing a surgeon he will let us know.   Orders Placed This Encounter  Procedures  . Ambulatory referral to Dermatology    Referral Priority:   Routine    Referral Type:   Consultation    Referral Reason:   Specialty  Services Required    Requested Specialty:   Dermatology    Number of Visits Requested:   1    Meds ordered this encounter  Medications  . traMADol (ULTRAM) 50 MG tablet    Sig: Take 1 tablet (50 mg total) by mouth every 6 (six) hours as needed.    Dispense:  30 tablet    Refill:  0     [name], MD [place]

## 2016-05-16 NOTE — Progress Notes (Signed)
Pre visit review using our clinic review tool, if applicable. No additional management support is needed unless otherwise documented below in the visit note. 

## 2016-05-16 NOTE — Patient Instructions (Signed)
Nice to meet you. We will refill your tramadol. We will refer you to dermatology. Please monitor your hernia and if this pops out of your stomach and does not reduce or becomes painful please seek medical attention immediately. If you develop chest pain, shortness breath, or palpitations please seek medical attention immediately as well.

## 2016-05-16 NOTE — Assessment & Plan Note (Signed)
Lesion on scalp appears to be a blue nevus. We will refer to dermatology for further evaluation. Does not have asymmetry, color change, or irregular borders.

## 2016-05-16 NOTE — Assessment & Plan Note (Signed)
Patient with a single episode of right lower quadrant abdominal discomfort several weeks ago. Resolved quickly. Had bruising following this. Given bruising, popping sensation, and quick onset of discomfort suspect musculoskeletal cause. Has benign exam today. He did not have any other associated symptoms. I do not feel this is related to his hernia as he had no pain in the area of his umbilical hernia. Which is fully reducible today. Given that he is asymptomatic and has not had any recurrence we opted to monitor. If there is recurrence he will let us know.

## 2016-05-16 NOTE — Assessment & Plan Note (Signed)
Patient with umbilical hernia and abdominal wall defect palpated. Fully reducible. No signs of obstruction or strangulation. Offered referral to general surgery for evaluation which he declined at this time. Discussed hernia return precautions. Continue to monitor at this time and when he decides on seeing a surgeon he will let us know.

## 2016-05-17 ENCOUNTER — Telehealth: Payer: Self-pay | Admitting: *Deleted

## 2016-05-17 ENCOUNTER — Encounter: Payer: Self-pay | Admitting: Cardiovascular Disease

## 2016-05-17 NOTE — Telephone Encounter (Signed)
Pt has been approved for Eliquis 5 mg 05/16/16-06/05/17.

## 2016-06-01 DIAGNOSIS — M170 Bilateral primary osteoarthritis of knee: Secondary | ICD-10-CM | POA: Diagnosis not present

## 2016-06-06 ENCOUNTER — Other Ambulatory Visit: Payer: Self-pay | Admitting: Internal Medicine

## 2016-06-07 ENCOUNTER — Other Ambulatory Visit: Payer: Self-pay | Admitting: *Deleted

## 2016-06-07 ENCOUNTER — Encounter: Payer: Self-pay | Admitting: *Deleted

## 2016-06-07 MED ORDER — METOPROLOL SUCCINATE ER 100 MG PO TB24: ORAL_TABLET | ORAL | 3 refills | Status: DC

## 2016-06-07 NOTE — Telephone Encounter (Signed)
Requested Prescriptions   Signed Prescriptions Disp Refills  . metoprolol succinate (TOPROL-XL) 100 MG 24 hr tablet 270 tablet 3    Sig: Take two tablets (200 mg) in the morning & take one tablet (100 mg) in the evening    Authorizing Provider: Minna Merritts    Ordering User: Britt Bottom

## 2016-06-10 ENCOUNTER — Telehealth: Payer: Self-pay | Admitting: Internal Medicine

## 2016-06-10 NOTE — Telephone Encounter (Signed)
Pt states his pharmacy is requesting information on Digoxin before they can fill. Please call.

## 2016-06-10 NOTE — Telephone Encounter (Signed)
Pt states [employer] made digoxin a tier 4 instead of tier 2. He applied for exemption which was denied.  [employer] needs list of medications and why he can't take a less expensive alternative to digoxin. He said pharmacist told him if he can get this information, he could possibly get the exemption. Pt has 1.5 weeks of digoxin left.  States he is allergic to amiodarone and cardizem and "Dr. Rockey Situ and Dr. Caryl Comes worked so hard to get this combination right, I don't want to go backwards."   [place] Case Number : [id number] Fax number: [phone]  Routed to Alvis Lemmings, RN

## 2016-06-13 ENCOUNTER — Other Ambulatory Visit: Payer: Self-pay | Admitting: *Deleted

## 2016-06-13 DIAGNOSIS — D229 Melanocytic nevi, unspecified: Secondary | ICD-10-CM | POA: Diagnosis not present

## 2016-06-13 DIAGNOSIS — L814 Other melanin hyperpigmentation: Secondary | ICD-10-CM | POA: Diagnosis not present

## 2016-06-13 DIAGNOSIS — L853 Xerosis cutis: Secondary | ICD-10-CM | POA: Diagnosis not present

## 2016-06-13 NOTE — Telephone Encounter (Signed)
Vaughan Basta, is there a certain tier exception form I should fill out for this patient or what do you suggest?

## 2016-06-14 ENCOUNTER — Telehealth: Payer: Self-pay

## 2016-06-14 NOTE — Telephone Encounter (Signed)
I just now spoke with patient about this Tier Exception. He tells me [employer] called him this past weekend. They granted his Tier Exception for Digoxin.

## 2016-06-14 NOTE — Telephone Encounter (Signed)
I can do this through Cover My Meds.

## 2016-06-14 NOTE — Telephone Encounter (Signed)
Thank you :)

## 2016-06-15 ENCOUNTER — Other Ambulatory Visit: Payer: Self-pay | Admitting: Family Medicine

## 2016-06-15 MED ORDER — TRAMADOL HCL 50 MG PO TABS: 50.0000 mg | ORAL_TABLET | Freq: Four times a day (QID) | ORAL | 2 refills | Status: DC | PRN

## 2016-06-15 NOTE — Telephone Encounter (Signed)
Last filled 05/16/16 30 0rf

## 2016-06-15 NOTE — Telephone Encounter (Signed)
faxed

## 2016-06-15 NOTE — Telephone Encounter (Signed)
Refill given. Please send to [facility]. Thanks.

## 2016-06-16 DIAGNOSIS — G4733 Obstructive sleep apnea (adult) (pediatric): Secondary | ICD-10-CM | POA: Diagnosis not present

## 2016-06-20 DIAGNOSIS — Z0100 Encounter for examination of eyes and vision without abnormal findings: Secondary | ICD-10-CM | POA: Diagnosis not present

## 2016-06-20 DIAGNOSIS — H524 Presbyopia: Secondary | ICD-10-CM | POA: Diagnosis not present

## 2016-06-29 DIAGNOSIS — M170 Bilateral primary osteoarthritis of knee: Secondary | ICD-10-CM | POA: Diagnosis not present

## 2016-07-02 DIAGNOSIS — M170 Bilateral primary osteoarthritis of knee: Secondary | ICD-10-CM | POA: Diagnosis not present

## 2016-07-06 DIAGNOSIS — M170 Bilateral primary osteoarthritis of knee: Secondary | ICD-10-CM | POA: Diagnosis not present

## 2016-07-13 DIAGNOSIS — M170 Bilateral primary osteoarthritis of knee: Secondary | ICD-10-CM | POA: Diagnosis not present

## 2016-07-15 ENCOUNTER — Telehealth: Payer: Self-pay | Admitting: Family Medicine

## 2016-07-15 DIAGNOSIS — G4733 Obstructive sleep apnea (adult) (pediatric): Secondary | ICD-10-CM | POA: Diagnosis not present

## 2016-07-15 NOTE — Telephone Encounter (Signed)
I called pt and left a vm to call the office to sch AWV. Thank you! °

## 2016-07-20 ENCOUNTER — Ambulatory Visit (INDEPENDENT_AMBULATORY_CARE_PROVIDER_SITE_OTHER): Payer: Medicare HMO

## 2016-07-20 VITALS — BP 122/70 | HR 88 | Temp 97.7°F | Resp 16 | Ht 71.0 in | Wt 340.1 lb

## 2016-07-20 DIAGNOSIS — Z0000 Encounter for general adult medical examination without abnormal findings: Secondary | ICD-10-CM | POA: Diagnosis not present

## 2016-07-20 NOTE — Progress Notes (Signed)
Subjective:   Carlos Crosby is a 61 y.o. male who presents for an Initial Medicare Annual Wellness Visit.  Review of Systems  No ROS.  Medicare Wellness Visit.  Cardiac Risk Factors include: advanced age (>61men, >8 women);obesity (BMI >30kg/m2);hypertension;male gender    Objective:    Today's Vitals   07/20/16 0811 07/20/16 0822  BP:  122/70  Pulse:  88  Resp:  16  Temp:  97.7 F (36.5 C)  TempSrc:  Oral  SpO2:  98%  Weight:  (!) 340 lb 1.9 oz (154.3 kg)  Height:  5\' 11"  (1.803 m)  PainSc: 2     Body mass index is 47.44 kg/m.  Current Medications (verified) Outpatient Encounter Prescriptions as of 07/20/2016  Medication Sig  . acetaminophen (TYLENOL) 500 MG tablet Take 1,000 mg by mouth every 6 (six) hours as needed.  Marland Kitchen apixaban (ELIQUIS) 5 MG TABS tablet Take 1 tablet (5 mg total) by mouth 2 (two) times daily.  . digoxin (LANOXIN) 0.25 MG tablet Take one tablet (0.25 mg) by mouth once daily six days a week  . furosemide (LASIX) 40 MG tablet Take 40 mg by mouth daily.  Marland Kitchen lisinopril (PRINIVIL,ZESTRIL) 2.5 MG tablet Take 1 tablet (2.5 mg total) by mouth daily.  . metoprolol succinate (TOPROL-XL) 100 MG 24 hr tablet Take two tablets (200 mg) in the morning & take one tablet (100 mg) in the evening  . Multiple Vitamins-Minerals (CENTRUM SILVER ADULT 50+) TABS Take 1 tablet by mouth daily.  . Potassium 95 MG TABS Take 1 tablet by mouth.  . pravastatin (PRAVACHOL) 80 MG tablet Take 1 tablet (80 mg total) by mouth every evening.  . traMADol (ULTRAM) 50 MG tablet Take 1 tablet (50 mg total) by mouth every 6 (six) hours as needed.  . verapamil (CALAN-SR) 120 MG CR tablet Take 1 tablet (120 mg total) by mouth daily.   No facility-administered encounter medications on file as of 07/20/2016.     Allergies (verified) Allopurinol; Diltiazem; Other; Penicillin g; Penicillins; and Ondansetron   History: Past Medical History:  Diagnosis Date  . Chronic a-fib ([place])   . Chronic  systolic CHF (congestive heart failure) ([place])   . Depression   . History of kidney stones   . History of pulmonary embolism   . Hyperlipidemia   . Hypertension   . Migraine   . Obesity   . OSA (obstructive sleep apnea)    on CPAP  . Osteoarthritis    bilateral knees  . Rocky Mountain spotted fever 2011   Past Surgical History:  Procedure Laterality Date  . CARDIAC CATHETERIZATION  2012   [facility]  . KNEE SURGERY     right knee   . LITHOTRIPSY  07/31/2014  . URETERAL STENT PLACEMENT  2016  . URETEROSCOPY     Family History  Problem Relation Age of Onset  . Cancer Father     lung  . Arthritis Father   . Hypertension Father   . Cancer Paternal Grandfather     Throat cancer   Social History   Occupational History  . Not on file.   Social History Main Topics  . Smoking status: Never Smoker  . Smokeless tobacco: Never Used  . Alcohol use Yes     Comment: occasional; 3 times a year  . Drug use: No  . Sexual activity: Not Currently   Tobacco Counseling Counseling given: Not Answered   Activities of Daily Living In your present state of health, do  you have any difficulty performing the following activities: 07/20/2016  Hearing? N  Vision? N  Difficulty concentrating or making decisions? N  Walking or climbing stairs? Y  Dressing or bathing? N  Doing errands, shopping? N  Preparing Food and eating ? N  Using the Toilet? N  In the past six months, have you accidently leaked urine? N  Do you have problems with loss of bowel control? N  Managing your Medications? N  Managing your Finances? N  Housekeeping or managing your Housekeeping? N  Some recent data might be hidden    Immunizations and Health Maintenance Immunization History  Administered Date(s) Administered  . Influenza-Unspecified 02/08/2016   Health Maintenance Due  Topic Date Due  . Hepatitis C Screening  11-16-1955  . HIV Screening  01/19/1972  . TETANUS/TDAP  01/19/1976    Patient Care  Team: Leone Haven, MD as PCP - General (Family Medicine)  Indicate any recent Medical Services you may have received from other than [facility] providers in the past year (date may be approximate).    Assessment:   This is a routine wellness examination for Carlos Crosby. The goal of the wellness visit is to assist the patient how to close the gaps in care and create a preventative care plan for the patient.   Osteoporosis risk reviewed.  Medications reviewed; taking without issues or barriers.  Safety issues reviewed; smoke detectors in the home. No firearms in the home. Wears seatbelts when driving or riding with others. No violence in the home.  No identified risk were noted; The patient was oriented x 3; appropriate in dress and manner and no objective failures at ADL's or IADL's.   BMI; discussed the importance of a healthy diet, water intake and exercise. Educational material provided.    HTN; followed by PCP.  HIV screening discussed; educational material provided.  TDAP deferred at this time.  Patient Concerns: None at this time. Follow up with PCP as needed.  Hearing/Vision screen Hearing Screening Comments: Patient passes the whisper test  Vision Screening Comments: Followed by [facility] Last OV 07/2016 Wears corrective lenses Visual acuity not assessed per patient preference since he has regular follow up with his ophthalmologist  Dietary issues and exercise activities discussed: Current Exercise Habits: Structured exercise class (Swims at the gym), Type of exercise: Other - see comments (Swimmimg), Time (Minutes): 60, Frequency (Times/Week): 6, Weekly Exercise (Minutes/Week): 360, Intensity: Intense  Goals    . Healthy Diet          Low carb foods. Educational material provided.    . Increase physical activity          Increase up to 7 laps when swimming, continue as tolerated      Depression Screen PHQ 2/9 Scores 07/20/2016 05/16/2016  PHQ - 2 Score  0 0    Fall Risk Fall Risk  07/20/2016 05/16/2016  Falls in the past year? No No    Cognitive Function: MMSE - Mini Mental State Exam 07/20/2016  Orientation to time 5  Orientation to Place 5  Registration 3  Attention/ Calculation 5  Recall 2  Recall-comments 2 out of 3 words recalled  Language- name 2 objects 2  Language- repeat 1  Language- follow 3 step command 3  Language- read & follow direction 1  Write a sentence 1  Copy design 1  Total score 29        Screening Tests Health Maintenance  Topic Date Due  . Hepatitis C Screening  May 18, 1956  . HIV Screening  01/19/1972  . TETANUS/TDAP  01/19/1976  . COLON CANCER SCREENING ANNUAL FOBT  03/08/2017  . INFLUENZA VACCINE  Completed        Plan:    End of life planning; Advance aging; Advanced directives discussed. No HCPOA/Living Will.  Additional information provided to help him start the conversation with his family.  Copy requested of HCPOA/Living Will short forms upon completion. Time spent on this topic is 25 minutes.  Medicare Attestation I have personally reviewed: The patient's medical and social history Their use of alcohol, tobacco or illicit drugs Their current medications and supplements The patient's functional ability including ADLs,fall risks, home safety risks, cognitive, and hearing and visual impairment Diet and physical activities Evidence for depression   The patient's weight, height, BMI, and visual acuity have been recorded in the chart.  I have made referrals and provided education to the patient based on review of the above and I have provided the patient with a written personalized care plan for preventive services.    During the course of the visit Demarko was educated and counseled about the following appropriate screening and preventive services:   Vaccines to include Pneumoccal, Influenza, Hepatitis B, Td, Zostavax, HCV  Electrocardiogram  Colorectal cancer  screening  Cardiovascular disease screening  Diabetes screening  Glaucoma screening  Nutrition counseling  Prostate cancer screening  Smoking cessation counseling  Patient Instructions (the written plan) were given to the patient.   Varney Biles, LPN   [id number]

## 2016-07-20 NOTE — Patient Instructions (Addendum)
Mr. Glenney , Thank you for taking time to come for your Medicare Wellness Visit. I appreciate your ongoing commitment to your health goals. Please review the following plan we discussed and let me know if I can assist you in the future.   Follow up with Dr. Caryl Bis as needed.  These are the goals we discussed: Goals    . Healthy Diet          Low carb foods. Educational material provided.    . Increase physical activity          Increase up to 7 laps when swimming, continue as tolerated       This is a list of the screening recommended for you and due dates:  Health Maintenance  Topic Date Due  .  Hepatitis C: One time screening is recommended by Center for Disease Control  (CDC) for  adults born from [birth year] through 1964.   June 17, 1955  . HIV Screening  01/19/1972  . Tetanus Vaccine  01/19/1976  . Stool Blood Test  03/08/2017  . Flu Shot  Completed    Health Maintenance, Male A healthy lifestyle and preventative care can promote health and wellness.  Maintain regular health, dental, and eye exams.  Eat a healthy diet. Foods like vegetables, fruits, whole grains, low-fat dairy products, and lean protein foods contain the nutrients you need and are low in calories. Decrease your intake of foods high in solid fats, added sugars, and salt. Get information about a proper diet from your health care provider, if necessary.  Regular physical exercise is one of the most important things you can do for your health. Most adults should get at least 150 minutes of moderate-intensity exercise (any activity that increases your heart rate and causes you to sweat) each week. In addition, most adults need muscle-strengthening exercises on 2 or more days a week.   Maintain a healthy weight. The body mass index (BMI) is a screening tool to identify possible weight problems. It provides an estimate of body fat based on height and weight. Your health care provider can find your BMI and can help you  achieve or maintain a healthy weight. For males 20 years and older:  A BMI below 18.5 is considered underweight.  A BMI of 18.5 to 24.9 is normal.  A BMI of 25 to 29.9 is considered overweight.  A BMI of 30 and above is considered obese.  Maintain normal blood lipids and cholesterol by exercising and minimizing your intake of saturated fat. Eat a balanced diet with plenty of fruits and vegetables. Blood tests for lipids and cholesterol should begin at age 16 and be repeated every 5 years. If your lipid or cholesterol levels are high, you are over age 69, or you are at high risk for heart disease, you may need your cholesterol levels checked more frequently.Ongoing high lipid and cholesterol levels should be treated with medicines if diet and exercise are not working.  If you smoke, find out from your health care provider how to quit. If you do not use tobacco, do not start.  Lung cancer screening is recommended for adults aged 38-80 years who are at high risk for developing lung cancer because of a history of smoking. A yearly low-dose CT scan of the lungs is recommended for people who have at least a 30-pack-year history of smoking and are current smokers or have quit within the past 15 years. A pack year of smoking is smoking an average of 1 pack  of cigarettes a day for 1 year (for example, a 30-pack-year history of smoking could mean smoking 1 pack a day for 30 years or 2 packs a day for 15 years). Yearly screening should continue until the smoker has stopped smoking for at least 15 years. Yearly screening should be stopped for people who develop a health problem that would prevent them from having lung cancer treatment.  If you choose to drink alcohol, do not have more than 2 drinks per day. One drink is considered to be 12 oz (360 mL) of beer, 5 oz (150 mL) of wine, or 1.5 oz (45 mL) of liquor.  Avoid the use of street drugs. Do not share needles with anyone. Ask for help if you need support  or instructions about stopping the use of drugs.  High blood pressure causes heart disease and increases the risk of stroke. High blood pressure is more likely to develop in:  People who have blood pressure in the end of the normal range (100-139/85-89 mm Hg).  People who are overweight or obese.  People who are African American.  If you are 60-60 years of age, have your blood pressure checked every 3-5 years. If you are 60 years of age or older, have your blood pressure checked every year. You should have your blood pressure measured twice-once when you are at a hospital or clinic, and once when you are not at a hospital or clinic. Record the average of the two measurements. To check your blood pressure when you are not at a hospital or clinic, you can use:  An automated blood pressure machine at a pharmacy.  A home blood pressure monitor.  If you are 60-60 years old, ask your health care provider if you should take aspirin to prevent heart disease.  Diabetes screening involves taking a blood sample to check your fasting blood sugar level. This should be done once every 3 years after age 60 if you are at a normal weight and without risk factors for diabetes. Testing should be considered at a younger age or be carried out more frequently if you are overweight and have at least 1 risk factor for diabetes.  Colorectal cancer can be detected and often prevented. Most routine colorectal cancer screening begins at the age of 60 and continues through age 92. However, your health care provider may recommend screening at an earlier age if you have risk factors for colon cancer. On a yearly basis, your health care provider may provide home test kits to check for hidden blood in the stool. A small camera at the end of a tube may be used to directly examine the colon (sigmoidoscopy or colonoscopy) to detect the earliest forms of colorectal cancer. Talk to your health care provider about this at age 75 when  routine screening begins. A direct exam of the colon should be repeated every 5-10 years through age 41, unless early forms of precancerous polyps or small growths are found.  People who are at an increased risk for hepatitis B should be screened for this virus. You are considered at high risk for hepatitis B if:  You were born in a country where hepatitis B occurs often. Talk with your health care provider about which countries are considered high risk.  Your parents were born in a high-risk country and you have not received a shot to protect against hepatitis B (hepatitis B vaccine).  You have HIV or AIDS.  You use needles to inject street drugs.  You live with, or have sex with, someone who has hepatitis B.  You are a man who has sex with other men (MSM).  You get hemodialysis treatment.  You take certain medicines for conditions like cancer, organ transplantation, and autoimmune conditions.  Hepatitis C blood testing is recommended for all people born from [birth year] through 1965 and any individual with known risk factors for hepatitis C.  Healthy men should no longer receive prostate-specific antigen (PSA) blood tests as part of routine cancer screening. Talk to your health care provider about prostate cancer screening.  Testicular cancer screening is not recommended for adolescents or adult males who have no symptoms. Screening includes self-exam, a health care provider exam, and other screening tests. Consult with your health care provider about any symptoms you have or any concerns you have about testicular cancer.  Practice safe sex. Use condoms and avoid high-risk sexual practices to reduce the spread of sexually transmitted infections (STIs).  You should be screened for STIs, including gonorrhea and chlamydia if:  You are sexually active and are younger than 24 years.  You are older than 24 years, and your health care provider tells you that you are at risk for this type of  infection.  Your sexual activity has changed since you were last screened, and you are at an increased risk for chlamydia or gonorrhea. Ask your health care provider if you are at risk.  If you are at risk of being infected with HIV, it is recommended that you take a prescription medicine daily to prevent HIV infection. This is called pre-exposure prophylaxis (PrEP). You are considered at risk if:  You are a man who has sex with other men (MSM).  You are a heterosexual man who is sexually active with multiple partners.  You take drugs by injection.  You are sexually active with a partner who has HIV.  Talk with your health care provider about whether you are at high risk of being infected with HIV. If you choose to begin PrEP, you should first be tested for HIV. You should then be tested every 3 months for as long as you are taking PrEP.  Use sunscreen. Apply sunscreen liberally and repeatedly throughout the day. You should seek shade when your shadow is shorter than you. Protect yourself by wearing long sleeves, pants, a wide-brimmed hat, and sunglasses year round whenever you are outdoors.  Tell your health care provider of new moles or changes in moles, especially if there is a change in shape or color. Also, tell your health care provider if a mole is larger than the size of a pencil eraser.  A one-time screening for abdominal aortic aneurysm (AAA) and surgical repair of large AAAs by ultrasound is recommended for men aged 35-75 years who are current or former smokers.  Stay current with your vaccines (immunizations). This information is not intended to replace advice given to you by your health care provider. Make sure you discuss any questions you have with your health care provider. Document Released: 11/19/2007 Document Revised: 06/13/2014 Document Reviewed: 02/24/2015 Elsevier Interactive Patient Education  2017 [employer].

## 2016-07-21 NOTE — Progress Notes (Signed)
I have reviewed the above note and agree.  Kyarah Enamorado, M.D.  

## 2016-07-26 DIAGNOSIS — G4733 Obstructive sleep apnea (adult) (pediatric): Secondary | ICD-10-CM | POA: Diagnosis not present

## 2016-07-26 DIAGNOSIS — G4730 Sleep apnea, unspecified: Secondary | ICD-10-CM | POA: Diagnosis not present

## 2016-08-02 DIAGNOSIS — M170 Bilateral primary osteoarthritis of knee: Secondary | ICD-10-CM | POA: Diagnosis not present

## 2016-08-16 ENCOUNTER — Ambulatory Visit: Payer: Medicare HMO | Admitting: Family Medicine

## 2016-08-16 DIAGNOSIS — G4733 Obstructive sleep apnea (adult) (pediatric): Secondary | ICD-10-CM | POA: Diagnosis not present

## 2016-08-17 ENCOUNTER — Telehealth: Payer: Self-pay | Admitting: Cardiovascular Disease

## 2016-08-17 NOTE — Telephone Encounter (Signed)
Patient requesting samples of apixaban (ELIQUIS) 5 MG TABS tablet due to insurance being late sending them to him. Please call patient when ready for pick up.

## 2016-08-17 NOTE — Telephone Encounter (Signed)
LMOV letting the patient know his samples of Eliquis 5 MG tablets are up front and ready for pick up.  3 Boxes Expiration date 08/2018 Lot Number: [id number]

## 2016-08-21 ENCOUNTER — Encounter: Payer: Self-pay | Admitting: Family Medicine

## 2016-08-21 ENCOUNTER — Other Ambulatory Visit: Payer: Self-pay | Admitting: Internal Medicine

## 2016-08-22 ENCOUNTER — Other Ambulatory Visit: Payer: Self-pay | Admitting: Internal Medicine

## 2016-08-22 MED ORDER — DIGOXIN 250 MCG PO TABS: ORAL_TABLET | ORAL | 0 refills | Status: DC

## 2016-08-23 ENCOUNTER — Other Ambulatory Visit: Payer: Self-pay | Admitting: Family Medicine

## 2016-08-24 NOTE — Telephone Encounter (Signed)
faxed

## 2016-08-24 NOTE — Telephone Encounter (Signed)
Next OV 08/31/16 last OV 05/16/16 last filled 06/15/16 30 2rf

## 2016-08-26 ENCOUNTER — Other Ambulatory Visit: Payer: Self-pay | Admitting: Internal Medicine

## 2016-08-30 DIAGNOSIS — M170 Bilateral primary osteoarthritis of knee: Secondary | ICD-10-CM | POA: Diagnosis not present

## 2016-08-31 ENCOUNTER — Ambulatory Visit: Payer: Medicare HMO | Admitting: Family Medicine

## 2016-09-02 ENCOUNTER — Other Ambulatory Visit: Payer: Self-pay | Admitting: Internal Medicine

## 2016-09-02 ENCOUNTER — Other Ambulatory Visit: Payer: Self-pay | Admitting: *Deleted

## 2016-09-02 ENCOUNTER — Encounter: Payer: Self-pay | Admitting: Internal Medicine

## 2016-09-02 MED ORDER — VERAPAMIL HCL ER 120 MG PO TBCR: 120.0000 mg | EXTENDED_RELEASE_TABLET | Freq: Every day | ORAL | 0 refills | Status: DC

## 2016-09-12 ENCOUNTER — Other Ambulatory Visit: Payer: Self-pay | Admitting: Family Medicine

## 2016-09-13 ENCOUNTER — Other Ambulatory Visit: Payer: Self-pay | Admitting: Family Medicine

## 2016-09-14 ENCOUNTER — Other Ambulatory Visit: Payer: Self-pay | Admitting: Family Medicine

## 2016-09-14 NOTE — Telephone Encounter (Signed)
Refill given. Patient needs follow-up set up for further refills. Thanks.

## 2016-09-14 NOTE — Telephone Encounter (Signed)
Last OV 05/16/16 no follow up scheduled last filled 08/24/16 30 0rf

## 2016-09-14 NOTE — Telephone Encounter (Signed)
faxed

## 2016-09-15 ENCOUNTER — Encounter: Payer: Self-pay | Admitting: Internal Medicine

## 2016-09-15 ENCOUNTER — Ambulatory Visit (INDEPENDENT_AMBULATORY_CARE_PROVIDER_SITE_OTHER): Payer: Medicare HMO | Admitting: Internal Medicine

## 2016-09-15 VITALS — BP 122/88 | HR 90 | Ht 71.0 in | Wt 338.5 lb

## 2016-09-15 DIAGNOSIS — I4821 Permanent atrial fibrillation: Secondary | ICD-10-CM

## 2016-09-15 DIAGNOSIS — I482 Chronic atrial fibrillation: Secondary | ICD-10-CM

## 2016-09-15 DIAGNOSIS — I429 Cardiomyopathy, unspecified: Secondary | ICD-10-CM

## 2016-09-15 NOTE — Patient Instructions (Signed)
Medication Instructions: - Your physician recommends that you continue on your current medications as directed. Please refer to the Current Medication list given to you today.  Labwork: - none ordered  Procedures/Testing: - Your physician has requested that you have an echocardiogram- in 6 months. Echocardiography is a painless test that uses sound waves to create images of your heart. It provides your doctor with information about the size and shape of your heart and how well your heart's chambers and valves are working. This procedure takes approximately one hour. There are no restrictions for this procedure.  Follow-Up: - Your physician wants you to follow-up in: 6 months with Dr. Gari Crown will receive a reminder letter in the mail two months in advance. If you don't receive a letter, please call our office to schedule the follow-up appointment.   Any Additional Special Instructions Will Be Listed Below (If Applicable).     If you need a refill on your cardiac medications before your next appointment, please call your pharmacy.

## 2016-09-15 NOTE — Progress Notes (Signed)
Patient Care Team: Leone Haven, MD as PCP - General (Family Medicine)   HPI  Carlos Crosby is a 61 y.o. male Seen in follow-up because of persistent atrial fibrillation dating back to 2014. He declined catheter ablation at [facility] at that time. A strategy of rate control had been pursued; it has been largely unsuccessful. Interval assessment of left ventricular systolic function has demonstrated significant worsening with an ejection fraction of 30-35%. This was 12/16. Repeat echocardiogram last week demonstrates no interval improvement. DATE TEST    12/16    Echo    EF 30-35 %   6/17    Echo   EF 45-50 %   7/17 Myoview EF 42% No ischemia    DATE TEST Mean HR   2/16    Holter 93 (52-185)      6/17    Holter 75(38-132)              His breathing is better. He has no edema. His major limitation now is his knees;   he is swimming now 4-5 days week  Struggling to lose weight .  NO further chest pain   He is using his [employer] monitor. All recorded heart rates are in the 60s and 70s and 80s  Past Medical History:  Diagnosis Date  . Chronic a-fib ([place])   . Chronic systolic CHF (congestive heart failure) ([place])   . Depression   . History of kidney stones   . History of pulmonary embolism   . Hyperlipidemia   . Hypertension   . Migraine   . Obesity   . OSA (obstructive sleep apnea)    on CPAP  . Osteoarthritis    bilateral knees  . Rocky Mountain spotted fever 2011    Past Surgical History:  Procedure Laterality Date  . CARDIAC CATHETERIZATION  2012   [facility]  . KNEE SURGERY     right knee   . LITHOTRIPSY  07/31/2014  . URETERAL STENT PLACEMENT  2016  . URETEROSCOPY      Current Outpatient Prescriptions  Medication Sig Dispense Refill  . acetaminophen (TYLENOL) 500 MG tablet Take 1,000 mg by mouth every 6 (six) hours as needed.    Marland Kitchen apixaban (ELIQUIS) 5 MG TABS tablet Take 1 tablet (5 mg total) by mouth 2 (two) times daily. 180 tablet 2  . digoxin (LANOXIN)  0.25 MG tablet Take one tablet (0.25 mg) by mouth once daily six days a week 90 tablet 0  . furosemide (LASIX) 40 MG tablet Take 40 mg by mouth daily as needed.     Marland Kitchen lisinopril (PRINIVIL,ZESTRIL) 2.5 MG tablet TAKE 1 TABLET EVERY DAY 90 tablet 3  . metoprolol succinate (TOPROL-XL) 100 MG 24 hr tablet Take two tablets (200 mg) in the morning & take one tablet (100 mg) in the evening 270 tablet 3  . Multiple Vitamins-Minerals (CENTRUM SILVER ADULT 50+) TABS Take 1 tablet by mouth daily.    . Potassium 95 MG TABS Take 1 tablet by mouth.    . pravastatin (PRAVACHOL) 80 MG tablet Take 1 tablet (80 mg total) by mouth every evening. 90 tablet 3  . traMADol (ULTRAM) 50 MG tablet TAKE 1 TABLET BY MOUTH EVERY 6 HOURS AS NEEDED 30 tablet 0  . verapamil (CALAN-SR) 120 MG CR tablet Take 1 tablet (120 mg total) by mouth daily. 90 tablet 0   No current facility-administered medications for this visit.     Allergies  Allergen Reactions  .  Allopurinol Hives and Swelling  . Diltiazem Itching and Rash  . Other Hives  . Penicillin G Nausea Only    And dirrhea  . Penicillins Other (See Comments)    Pt unsure of rxn; showed up on allergy test  . Ondansetron Rash      Review of Systems negative except from HPI and PMH  Physical Exam BP 122/88 (BP Location: Left Arm, Patient Position: Sitting, Cuff Size: Large)   Pulse 90   Ht 5\' 11"  (1.803 m)   Wt (!) 338 lb 8 oz (153.5 kg)   BMI 47.21 kg/m  Well developed and Morbidly obese in no acute distress HENT normal E scleral and icterus clear Neck Supple JVP flat; carotids brisk and full Clear to ausculation irregularly irregular no murmurs gallops or rub Soft with active bowel sounds No clubbing cyanosis  tr+edema Alert and oriented, grossly normal motor and sensory function Skin Warm and Dry  ECG  Afib 93 Personally reviewed   -/09/36 \\Axis  -21  Assessment and  Plan   Afib  now controlled ventricular response  Caradiomyopathy--presumed  recurrent rate related with significant interval improvement as rate control has been accomplished  OSA  Morbidly obese  HTN  BP well controlled  Having encouraged him to not only exercise but also to decrease carbohydrate intake  Following through with CPAP for OSA  AFib rate controlled tolerating meds  On Anticoagulation;  No bleeding issues    With his cardiomyopathy, will continue his beta blocker and ACE inhibitor. Calcium blockers being used for adjunctive rate control. We will reassess LVEF in the fall

## 2016-09-22 DIAGNOSIS — G4733 Obstructive sleep apnea (adult) (pediatric): Secondary | ICD-10-CM | POA: Diagnosis not present

## 2016-09-22 DIAGNOSIS — G4730 Sleep apnea, unspecified: Secondary | ICD-10-CM | POA: Diagnosis not present

## 2016-09-25 ENCOUNTER — Other Ambulatory Visit: Payer: Self-pay | Admitting: Family Medicine

## 2016-09-26 NOTE — Telephone Encounter (Signed)
This was just refilled. Previously he noted he only took it several times a month. Please find out if something has changed with his pain and please set him up for follow-up. Thanks.

## 2016-09-26 NOTE — Telephone Encounter (Signed)
Last OV 05/16/2016 last filled 09/14/2016 30 0rf

## 2016-09-27 ENCOUNTER — Other Ambulatory Visit: Payer: Self-pay | Admitting: Family Medicine

## 2016-09-28 NOTE — Telephone Encounter (Signed)
Patient states he takes no more than two pills a day, however this week for kneepain has increased two pills yesterday, two Monday, two yesterday.   His old script has expired.  He is going out of town on Friday to [place] .

## 2016-09-28 NOTE — Telephone Encounter (Signed)
Left message to return call 

## 2016-09-28 NOTE — Telephone Encounter (Signed)
Refill can be faxed. If he continues to have increased issues with his knee he should follow-up.

## 2016-09-28 NOTE — Telephone Encounter (Signed)
Pt called back returning your call. Thank you!  Call pt @ [phone]

## 2016-09-29 NOTE — Telephone Encounter (Signed)
Faxed to pharmacy

## 2016-09-29 NOTE — Telephone Encounter (Signed)
Patient advised of below he will be scheduling his regular follow up appointment with Dr Caryl Bis. Would like script faxed to [facility].

## 2016-09-30 DIAGNOSIS — M170 Bilateral primary osteoarthritis of knee: Secondary | ICD-10-CM | POA: Diagnosis not present

## 2016-10-24 ENCOUNTER — Other Ambulatory Visit: Payer: Self-pay | Admitting: Family Medicine

## 2016-10-24 ENCOUNTER — Encounter: Payer: Self-pay | Admitting: Family Medicine

## 2016-10-24 MED ORDER — TRAMADOL HCL 50 MG PO TABS: 50.0000 mg | ORAL_TABLET | Freq: Four times a day (QID) | ORAL | 0 refills | Status: DC | PRN

## 2016-10-24 NOTE — Telephone Encounter (Signed)
Please fax

## 2016-10-24 NOTE — Telephone Encounter (Signed)
Medication faxed to pharmacy 

## 2016-10-26 ENCOUNTER — Ambulatory Visit (INDEPENDENT_AMBULATORY_CARE_PROVIDER_SITE_OTHER): Payer: Medicare HMO | Admitting: Family Medicine

## 2016-10-26 ENCOUNTER — Encounter: Payer: Self-pay | Admitting: Family Medicine

## 2016-10-26 ENCOUNTER — Other Ambulatory Visit: Payer: Self-pay | Admitting: Family Medicine

## 2016-10-26 VITALS — BP 122/84 | HR 58 | Temp 97.6°F | Wt 342.6 lb

## 2016-10-26 DIAGNOSIS — L03114 Cellulitis of left upper limb: Secondary | ICD-10-CM

## 2016-10-26 DIAGNOSIS — R319 Hematuria, unspecified: Secondary | ICD-10-CM

## 2016-10-26 DIAGNOSIS — L0390 Cellulitis, unspecified: Secondary | ICD-10-CM | POA: Insufficient documentation

## 2016-10-26 DIAGNOSIS — R7309 Other abnormal glucose: Secondary | ICD-10-CM

## 2016-10-26 LAB — COMPREHENSIVE METABOLIC PANEL
ALT: 18 U/L (ref 0–53)
AST: 16 U/L (ref 0–37)
Albumin: 4.4 g/dL (ref 3.5–5.2)
Alkaline Phosphatase: 50 U/L (ref 39–117)
BUN: 21 mg/dL (ref 6–23)
CO2: 24 mEq/L (ref 19–32)
Calcium: 9.8 mg/dL (ref 8.4–10.5)
Chloride: 102 mEq/L (ref 96–112)
Creatinine, Ser: 1.06 mg/dL (ref 0.40–1.50)
GFR: 75.8 mL/min (ref >60.00)
Glucose, Bld: 178 mg/dL — ABNORMAL HIGH (ref 70–99)
Potassium: 4.4 mEq/L (ref 3.5–5.1)
Sodium: 137 mEq/L (ref 135–145)
Total Bilirubin: 0.7 mg/dL (ref 0.2–1.2)
Total Protein: 6.9 g/dL (ref 6.0–8.3)

## 2016-10-26 LAB — POCT URINALYSIS DIPSTICK
Bilirubin, UA: NEGATIVE
Blood, UA: NEGATIVE
Glucose, UA: NEGATIVE
Ketones, UA: NEGATIVE
Leukocytes, UA: NEGATIVE
Nitrite, UA: NEGATIVE
Protein, UA: NEGATIVE
Spec Grav, UA: >=1.03 — AB (ref 1.010–1.025)
Urobilinogen, UA: 0.2 E.U./dL
pH, UA: 5.5 (ref 5.0–8.0)

## 2016-10-26 MED ORDER — DOXYCYCLINE MONOHYDRATE 100 MG PO CAPS: 100.0000 mg | ORAL_CAPSULE | Freq: Two times a day (BID) | ORAL | 0 refills | Status: DC

## 2016-10-26 NOTE — Assessment & Plan Note (Signed)
Patient with erythema that has extended up his ulnar aspect left forearm from the area of the hornet sting. Concern is for cellulitis given warmth and erythema. Does not appear to just be a local reaction. We'll start him on doxycycline given his penicillin allergy. I discussed having him return in 2 days for recheck though he opted to recheck if he is not improving. I discussed return precautions with him. Advised on a probiotic with the antibiotic.

## 2016-10-26 NOTE — Assessment & Plan Note (Signed)
Suspect this is related to a kidney stone. UA today unremarkable. Given the hematuria and prior kidney stone history we will refer to urology for further evaluation.

## 2016-10-26 NOTE — Patient Instructions (Addendum)
Nice to see you. We'll start you on doxycycline for likely skin infection. If this is not improving or if it worsens you will need to follow-up in the office. Please work on diet changes as outlined below. We'll get you to see urology. We will check your kidney function today and contact you with the results.  Diet Recommendations  Starchy (carb) foods: Bread, rice, pasta, potatoes, corn, cereal, grits, crackers, bagels, muffins, all baked goods.  (Fruits, milk, and yogurt also have carbohydrate, but most of these foods will not spike your blood sugar as the starchy foods will.)  A few fruits do cause high blood sugars; use small portions of bananas (limit to 1/2 at a time), grapes, watermelon, oranges, and most tropical fruits.    Protein foods: Meat, fish, poultry, eggs, dairy foods, and beans such as pinto and kidney beans (beans also provide carbohydrate).   1. Eat at least 3 meals and 1-2 snacks per day. Never go more than 4-5 hours while awake without eating. Eat breakfast within the first hour of getting up.   2. Limit starchy foods to TWO per meal and ONE per snack. ONE portion of a starchy  food is equal to the following:   - ONE slice of bread (or its equivalent, such as half of a hamburger bun).   - 1/2 cup of a "scoopable" starchy food such as potatoes or rice.   - 15 grams of carbohydrate as shown on food label.  3. Include at every meal: a protein food, a carb food, and vegetables and/or fruit.   - Obtain twice the volume of veg's as protein or carbohydrate foods for both lunch and dinner.   - Fresh or frozen veg's are best.   - Keep frozen veg's on hand for a quick vegetable serving.

## 2016-10-26 NOTE — Progress Notes (Signed)
[name], MD Phone: [phone]  Carlos Crosby is a 61 y.o. male who presents today for follow-up.  Patient notes he woke up Sunday morning and noted blood in his urine. Notes the urine was pink in color. Had been having some mild low back discomfort that he thought was related to increased physical activity though the low back discomfort resolved after passing the blood. He noted no dysuria or pain with urination. He does have a history of kidney stones. He has not had any recurrent hematuria. His back pain has resolved.  Patient reports he was stung by a hornet on Monday. Notes the area was swollen immediately following this. Does note there is some erythema surrounding it. He has had some erythema up his forearm as well. Notes it itches. No pain. Has been using Benadryl. The swelling might be slightly better.  Obesity: Patient has increased his physical activity though has not really changed his diet. He states he does not eat very much. He had muffins and bacon for breakfast this morning. He had buffalo wings and mozzarella sticks for dinner last night. He likes peas and corn though not many other vegetables. Also eats a lot of potatoes.  PMH: nonsmoker.   ROS see history of present illness  Objective  Physical Exam Vitals:   10/26/16 0831  BP: 122/84  Pulse: (!) 58  Temp: 97.6 F (36.4 C)    BP Readings from Last 3 Encounters:  10/26/16 122/84  09/15/16 122/88  07/20/16 122/70   Wt Readings from Last 3 Encounters:  10/26/16 (!) 342 lb 9.6 oz (155.4 kg)  09/15/16 (!) 338 lb 8 oz (153.5 kg)  07/20/16 (!) 340 lb 1.9 oz (154.3 kg)    Physical Exam  Constitutional: No distress.  Cardiovascular: Normal rate and normal heart sounds.  An irregularly irregular rhythm present.  Pulmonary/Chest: Effort normal and breath sounds normal.  Neurological: He is alert. Gait normal.  Skin: He is not diaphoretic.        Assessment/Plan: Please see individual problem  list.  Hematuria Suspect this is related to a kidney stone. UA today unremarkable. Given the hematuria and prior kidney stone history we will refer to urology for further evaluation.  Cellulitis Patient with erythema that has extended up his ulnar aspect left forearm from the area of the hornet sting. Concern is for cellulitis given warmth and erythema. Does not appear to just be a local reaction. We'll start him on doxycycline given his penicillin allergy. I discussed having him return in 2 days for recheck though he opted to recheck if he is not improving. I discussed return precautions with him. Advised on a probiotic with the antibiotic.  Morbid obesity He has been exercising extensively compared to prior. Discussed that his diet does not sound too healthy even though he reports he does not eat that much. Discussed dietary changes with him at length. Offered referral to dietitian though he wanted dietary instructions first.   Orders Placed This Encounter  Procedures  . Comp Met (CMET)  . Ambulatory referral to Urology    Referral Priority:   Routine    Referral Type:   Consultation    Referral Reason:   Specialty Services Required    Requested Specialty:   Urology    Number of Visits Requested:   1  . POCT urinalysis dipstick    Meds ordered this encounter  Medications  . doxycycline (MONODOX) 100 MG capsule    Sig: Take 1 capsule (100 mg  total) by mouth 2 (two) times daily.    Dispense:  14 capsule    Refill:  0    [name], MD [place]

## 2016-10-26 NOTE — Assessment & Plan Note (Signed)
He has been exercising extensively compared to prior. Discussed that his diet does not sound too healthy even though he reports he does not eat that much. Discussed dietary changes with him at length. Offered referral to dietitian though he wanted dietary instructions first.

## 2016-10-26 NOTE — Progress Notes (Signed)
fol

## 2016-10-28 DIAGNOSIS — G4733 Obstructive sleep apnea (adult) (pediatric): Secondary | ICD-10-CM | POA: Diagnosis not present

## 2016-10-28 DIAGNOSIS — G4730 Sleep apnea, unspecified: Secondary | ICD-10-CM | POA: Diagnosis not present

## 2016-11-01 ENCOUNTER — Other Ambulatory Visit (INDEPENDENT_AMBULATORY_CARE_PROVIDER_SITE_OTHER): Payer: Medicare HMO

## 2016-11-01 ENCOUNTER — Ambulatory Visit: Payer: Medicare HMO

## 2016-11-01 DIAGNOSIS — R7309 Other abnormal glucose: Secondary | ICD-10-CM

## 2016-11-01 LAB — POCT GLYCOSYLATED HEMOGLOBIN (HGB A1C): Hemoglobin A1C: 5.3

## 2016-11-02 ENCOUNTER — Encounter: Payer: Self-pay | Admitting: Cardiovascular Disease

## 2016-11-03 ENCOUNTER — Telehealth: Payer: Self-pay | Admitting: *Deleted

## 2016-11-03 NOTE — Telephone Encounter (Signed)
Received MyChart message from patient for samples. Medication Samples have been placed at front desk for patient to pick up at his earliest convenience. Response sent to patient via MyChart to notify patient.  Drug name: Eliquis       Strength: 5mg         Qty: 3 boxes (42 tablets)  LOT: [id number]  Exp.Date: dec 2020  Dosing instructions: Take 1 tablet (5mg ) by mouth two times a day.

## 2016-11-17 ENCOUNTER — Other Ambulatory Visit: Payer: Self-pay | Admitting: Family Medicine

## 2016-11-17 ENCOUNTER — Ambulatory Visit: Payer: Medicare HMO | Admitting: Family Medicine

## 2016-11-18 NOTE — Telephone Encounter (Signed)
Phoned in.

## 2016-11-18 NOTE — Telephone Encounter (Signed)
Last OV 10/26/16 last filled 10/24/16 30 0rf

## 2016-11-24 ENCOUNTER — Ambulatory Visit: Payer: Self-pay | Admitting: Urology

## 2016-11-24 NOTE — Progress Notes (Deleted)
11/24/2016 7:51 AM   Carlos Crosby 1956-01-16 [id number]  Referring provider: Leone Haven, MD [address] [place], [place] [address]  No chief complaint on file.   HPI: Patient is a 61 -year-old Caucasian male who presents today as a referral from their PCP, Dr. [name], for gross hematuria.    Patient was found to have *** hematuria on *** with *** RBC's/hpf.  Patient *** does/doesn't have a prior history of microscopic hematuria.    He does have a prior history of nephrolithiasis and underwent URS in 2016 with Dr. Yves Dill.   trauma to the genitourinary tract, BPH or malignancies of the genitourinary tract. ***  She/He does/ does not have a prior history of recurrent urinary tract infections, nephrolithiasis, trauma to the genitourinary tract, BPH or malignancies of the genitourinary tract. ***  She/He does/does not have a family medical history of nephrolithiasis, malignancies of the genitourinary tract or hematuria. ***  She/He does/does not have a family medical history of nephrolithiasis, malignancies of the genitourinary tract or hematuria. ***  Today, she/he are having/not having symptoms of frequent urination, urgency, dysuria, nocturia, incontinence, hesitancy, intermittency, straining to urinate or a weak urinary stream.  Her/His UA today demonstrates ***.  Today, she/he are having/not having symptoms of frequent urination, urgency, dysuria, nocturia, incontinence, hesitancy, intermittency, straining to urinate or a weak urinary stream.  Her/His UA today demonstrates ***.  She/He is not experiencing any suprapubic pain, abdominal pain or flank pain. He/She denies any recent fevers, chills, nausea or vomiting. ***  She/He is not experiencing any suprapubic pain, abdominal pain or flank pain. He/She denies any recent fevers, chills, nausea or vomiting. ***  She/He have/have not had any recent imaging studies. ***  He/She are/are not a smoker.  She/He is a former smoker, with a*** ppd history.  Quit *** years ago.  They are/are not exposed to secondhand smoke.  They have/have not worked with Sports administrator, trichloroethylene, etc.   ***  He/She has HTN. ***   He/She has a high BMI.     PMH: Past Medical History:  Diagnosis Date  . Chronic a-fib ([place])   . Chronic systolic CHF (congestive heart failure) ([place])   . Depression   . History of kidney stones   . History of pulmonary embolism   . Hyperlipidemia   . Hypertension   . Migraine   . Obesity   . OSA (obstructive sleep apnea)    on CPAP  . Osteoarthritis    bilateral knees  . Rocky [place] spotted fever 2011    Surgical History: Past Surgical History:  Procedure Laterality Date  . CARDIAC CATHETERIZATION  2012   [facility]  . KNEE SURGERY     right knee   . LITHOTRIPSY  07/31/2014  . URETERAL STENT PLACEMENT  2016  . URETEROSCOPY      Home Medications:  Allergies as of 11/24/2016      Reactions   Allopurinol Hives, Swelling   Diltiazem Itching, Rash   Other Hives   Penicillin G Nausea Only   And dirrhea   Penicillins Other (See Comments)   Pt unsure of rxn; showed up on allergy test   Ondansetron Rash      Medication List       Accurate as of 11/24/16  7:51 AM. Always use your most recent med list.          acetaminophen 500 MG tablet Commonly known as:  TYLENOL Take 1,000 mg by  mouth every 6 (six) hours as needed.   apixaban 5 MG Tabs tablet Commonly known as:  ELIQUIS Take 1 tablet (5 mg total) by mouth 2 (two) times daily.   CENTRUM SILVER ADULT 50+ Tabs Take 1 tablet by mouth daily.   digoxin 0.25 MG tablet Commonly known as:  LANOXIN Take one tablet (0.25 mg) by mouth once daily six days a week   doxycycline 100 MG capsule Commonly known as:  MONODOX Take 1 capsule (100 mg total) by mouth 2 (two) times daily.   furosemide 40 MG tablet Commonly known as:  LASIX Take 40 mg by mouth daily as needed.   lisinopril 2.5 MG  tablet Commonly known as:  PRINIVIL,ZESTRIL TAKE 1 TABLET EVERY DAY   metoprolol succinate 100 MG 24 hr tablet Commonly known as:  TOPROL-XL Take two tablets (200 mg) in the morning & take one tablet (100 mg) in the evening   Potassium 95 MG Tabs Take 1 tablet by mouth.   pravastatin 80 MG tablet Commonly known as:  PRAVACHOL Take 1 tablet (80 mg total) by mouth every evening.   traMADol 50 MG tablet Commonly known as:  ULTRAM TAKE 1 TABLET BY MOUTH EVERY 6 HOURS AS NEEDED   verapamil 120 MG CR tablet Commonly known as:  CALAN-SR Take 1 tablet (120 mg total) by mouth daily.       Allergies:  Allergies  Allergen Reactions  . Allopurinol Hives and Swelling  . Diltiazem Itching and Rash  . Other Hives  . Penicillin G Nausea Only    And dirrhea  . Penicillins Other (See Comments)    Pt unsure of rxn; showed up on allergy test  . Ondansetron Rash    Family History: Family History  Problem Relation Age of Onset  . Cancer Father        lung  . Arthritis Father   . Hypertension Father   . Cancer Paternal Grandfather        Throat cancer    Social History:  reports that he has never smoked. He has never used smokeless tobacco. He reports that he drinks alcohol. He reports that he does not use drugs.  ROS:                                        Physical Exam: There were no vitals taken for this visit.  Constitutional: Well nourished. Alert and oriented, No acute distress. HEENT: [place] AT, moist mucus membranes. Trachea midline, no masses. Cardiovascular: No clubbing, cyanosis, or edema. Respiratory: Normal respiratory effort, no increased work of breathing. GI: Abdomen is soft, non tender, non distended, no abdominal masses. Liver and spleen not palpable.  No hernias appreciated.  Stool sample for occult testing is not indicated.   GU: No CVA tenderness.  No bladder fullness or masses.  Patient with circumcised/uncircumcised phallus.  ***Foreskin easily retracted***  Urethral meatus is patent.  No penile discharge. No penile lesions or rashes. Scrotum without lesions, cysts, rashes and/or edema.  Testicles are located scrotally bilaterally. No masses are appreciated in the testicles. Left and right epididymis are normal. Rectal: Patient with  normal sphincter tone. Anus and perineum without scarring or rashes. No rectal masses are appreciated. Prostate is approximately *** grams, *** nodules are appreciated. Seminal vesicles are normal. Skin: No rashes, bruises or suspicious lesions. Lymph: No cervical or inguinal adenopathy. Neurologic: Grossly intact, no focal  deficits, moving all 4 extremities. Psychiatric: Normal mood and affect.  Laboratory Data: Lab Results  Component Value Date   WBC 14.5 (H) 10/18/2014   HGB 14.7 10/18/2014   HCT 43.1 10/18/2014   MCV 98.4 10/18/2014   PLT 195 10/18/2014    Lab Results  Component Value Date   CREATININE 1.06 10/26/2016     Lab Results  Component Value Date   HGBA1C 5.3 11/01/2016    Lab Results  Component Value Date   TSH 2.161 05/14/2015    Lab Results  Component Value Date   AST 16 10/26/2016   Lab Results  Component Value Date   ALT 18 10/26/2016   No components found for: ALKALINEPHOPHATASE No components found for: BILIRUBINTOTAL  No results found for: ESTRADIOL   Urinalysis    Component Value Date/Time   COLORURINE YELLOW (A) 10/18/2014 0628   APPEARANCEUR CLOUDY (A) 10/18/2014 0628   APPEARANCEUR Clear 08/29/2014 0827   LABSPEC 1.010 10/18/2014 0628   LABSPEC 1.011 08/29/2014 0827   PHURINE 5.0 10/18/2014 0628   GLUCOSEU NEGATIVE 10/18/2014 0628   GLUCOSEU Negative 08/29/2014 0827   HGBUR 3+ (A) 10/18/2014 0628   BILIRUBINUR negative 10/26/2016 0842   BILIRUBINUR Negative 08/29/2014 0827   KETONESUR NEGATIVE 10/18/2014 0628   PROTEINUR negative 10/26/2016 0842   PROTEINUR 100 (A) 10/18/2014 0628   UROBILINOGEN 0.2 10/26/2016 0842    NITRITE negative 10/26/2016 0842   NITRITE NEGATIVE 10/18/2014 0628   LEUKOCYTESUR Negative 10/26/2016 0842   LEUKOCYTESUR Negative 08/29/2014 0827    Pertinent Imaging: ***  Assessment & Plan:  ***  1. *** hematuria  - I explained to the patient that there are a number of causes that can be associated with blood in the urine, such as stones, *** BPH, UTI's, damage to the urinary tract and/or cancer.  - At this time, I felt that the patient warranted further urologic evaluation.   The AUA guidelines state that a CT urogram is the preferred imaging study to evaluate hematuria.  - I explained to the patient that a contrast material will be injected into a vein and that in rare instances, an allergic reaction can result and may even life threatening   The patient denies any allergies to contrast***, iodine and/or seafood*** and is not taking metformin.***  - Her reproductive status is hysterectomy, postmenopausal, tubal ligation are unknown at this time.  We will obtain a serum pregnancy test today. ***  - On occasion, we may need to resort to a non-contrast study such as a renal ultrasound or a non-contrast CT if the patient has a contrast allergy or renal insufficiency.  In the latter case,  I told him/her *** that an upper tract study without contrast will lack the detail of excluding some urologic tumors.  Because of this, he/she would need to undergo cystoscopy with bilateral retrogrades in the OR to complete the hematuria workup in addition to the imaging studies. ***  - Following the imaging study,  I've recommended a cystoscopy. I described how this is performed, typically in an office setting with a flexible cystoscope. We described the risks, benefits, and possible side effects, the most common of which is a minor amount of blood in the urine and/or burning which usually resolves in 24 to 48 hours.  ***  - The patient had the opportunity to ask questions which were answered. Based upon this  discussion, the patient is willing to proceed. Therefore, I've ordered: a CT Urogram and cystoscopy.  -  The patient will return following all of the above for discussion of the results.   - UA  - Urine culture  - BUN + creatinine    There are no diagnoses linked to this encounter.  No Follow-up on file.  These notes generated with voice recognition software. I apologize for typographical errors.  Zara Council, [facility] [address] [place], [place] [address] [phone]

## 2016-11-28 ENCOUNTER — Other Ambulatory Visit: Payer: Self-pay | Admitting: Internal Medicine

## 2016-11-30 DIAGNOSIS — G4730 Sleep apnea, unspecified: Secondary | ICD-10-CM | POA: Diagnosis not present

## 2016-11-30 DIAGNOSIS — G4733 Obstructive sleep apnea (adult) (pediatric): Secondary | ICD-10-CM | POA: Diagnosis not present

## 2016-12-05 ENCOUNTER — Telehealth: Payer: Self-pay

## 2016-12-05 NOTE — Telephone Encounter (Signed)
Eliquis 5 mg tablet  #4 boxes Samples placed at the front desk for pick up.

## 2016-12-05 NOTE — Telephone Encounter (Signed)
Pt would like Eliquis samples.  

## 2016-12-08 ENCOUNTER — Telehealth: Payer: Self-pay | Admitting: Cardiovascular Disease

## 2016-12-08 ENCOUNTER — Encounter: Payer: Self-pay | Admitting: Cardiovascular Disease

## 2016-12-08 NOTE — Telephone Encounter (Signed)
PA request submitted to [email or web address] for pt's metoprolol. Approval [id number].

## 2016-12-12 ENCOUNTER — Other Ambulatory Visit: Payer: Self-pay | Admitting: Family Medicine

## 2016-12-12 NOTE — Telephone Encounter (Signed)
Last refill was 11/18/2016, #30 with no refills, last OV was an acute on 10/26/16, Please advise for refills, thanks

## 2016-12-13 ENCOUNTER — Encounter: Payer: Self-pay | Admitting: Family Medicine

## 2016-12-22 ENCOUNTER — Ambulatory Visit: Payer: Self-pay | Admitting: Urology

## 2017-01-03 DIAGNOSIS — G4730 Sleep apnea, unspecified: Secondary | ICD-10-CM | POA: Diagnosis not present

## 2017-01-03 DIAGNOSIS — G4733 Obstructive sleep apnea (adult) (pediatric): Secondary | ICD-10-CM | POA: Diagnosis not present

## 2017-01-05 ENCOUNTER — Encounter: Payer: Self-pay | Admitting: Family Medicine

## 2017-01-05 ENCOUNTER — Other Ambulatory Visit: Payer: Self-pay | Admitting: Family Medicine

## 2017-01-05 MED ORDER — TRAMADOL HCL 50 MG PO TABS: 50.0000 mg | ORAL_TABLET | Freq: Four times a day (QID) | ORAL | 0 refills | Status: DC | PRN

## 2017-01-20 ENCOUNTER — Encounter: Payer: Self-pay | Admitting: Cardiovascular Disease

## 2017-01-21 ENCOUNTER — Other Ambulatory Visit: Payer: Self-pay | Admitting: Internal Medicine

## 2017-01-23 ENCOUNTER — Other Ambulatory Visit: Payer: Self-pay | Admitting: *Deleted

## 2017-01-23 MED ORDER — DIGOXIN 250 MCG PO TABS: ORAL_TABLET | ORAL | 3 refills | Status: DC

## 2017-01-23 NOTE — Telephone Encounter (Signed)
Requested Prescriptions   Signed Prescriptions Disp Refills  . digoxin (LANOXIN) 0.25 MG tablet 90 tablet 3    Sig: Take one tablet (0.25 mg) by mouth once daily six days a week    Authorizing Provider: Deboraha Sprang    Ordering User: Britt Bottom

## 2017-01-26 ENCOUNTER — Encounter: Payer: Self-pay | Admitting: Family Medicine

## 2017-01-26 ENCOUNTER — Ambulatory Visit (INDEPENDENT_AMBULATORY_CARE_PROVIDER_SITE_OTHER): Payer: Medicare HMO | Admitting: Family Medicine

## 2017-01-26 VITALS — BP 130/80 | HR 75 | Temp 97.9°F | Wt 345.0 lb

## 2017-01-26 DIAGNOSIS — Z23 Encounter for immunization: Secondary | ICD-10-CM | POA: Diagnosis not present

## 2017-01-26 DIAGNOSIS — I482 Chronic atrial fibrillation: Secondary | ICD-10-CM | POA: Diagnosis not present

## 2017-01-26 DIAGNOSIS — I4821 Permanent atrial fibrillation: Secondary | ICD-10-CM

## 2017-01-26 DIAGNOSIS — E785 Hyperlipidemia, unspecified: Secondary | ICD-10-CM

## 2017-01-26 DIAGNOSIS — J309 Allergic rhinitis, unspecified: Secondary | ICD-10-CM | POA: Diagnosis not present

## 2017-01-26 DIAGNOSIS — M170 Bilateral primary osteoarthritis of knee: Secondary | ICD-10-CM

## 2017-01-26 DIAGNOSIS — N200 Calculus of kidney: Secondary | ICD-10-CM

## 2017-01-26 DIAGNOSIS — Z125 Encounter for screening for malignant neoplasm of prostate: Secondary | ICD-10-CM

## 2017-01-26 LAB — COMPREHENSIVE METABOLIC PANEL
ALT: 21 U/L (ref 0–53)
AST: 18 U/L (ref 0–37)
Albumin: 4.1 g/dL (ref 3.5–5.2)
Alkaline Phosphatase: 42 U/L (ref 39–117)
BUN: 17 mg/dL (ref 6–23)
CO2: 27 mEq/L (ref 19–32)
Calcium: 9.3 mg/dL (ref 8.4–10.5)
Chloride: 102 mEq/L (ref 96–112)
Creatinine, Ser: 0.99 mg/dL (ref 0.40–1.50)
GFR: 81.95 mL/min (ref >60.00)
Glucose, Bld: 118 mg/dL — ABNORMAL HIGH (ref 70–99)
Potassium: 4.6 mEq/L (ref 3.5–5.1)
Sodium: 139 mEq/L (ref 135–145)
Total Bilirubin: 0.8 mg/dL (ref 0.2–1.2)
Total Protein: 6.6 g/dL (ref 6.0–8.3)

## 2017-01-26 LAB — LIPID PANEL
Cholesterol: 146 mg/dL (ref 0–200)
HDL: 34.4 mg/dL — ABNORMAL LOW (ref >39.00)
NonHDL: 111.66
Total CHOL/HDL Ratio: 4
Triglycerides: 246 mg/dL — ABNORMAL HIGH (ref 0.0–149.0)
VLDL: 49.2 mg/dL — ABNORMAL HIGH (ref 0.0–40.0)

## 2017-01-26 LAB — PSA, MEDICARE: PSA: 0.16 ng/ml (ref 0.10–4.00)

## 2017-01-26 LAB — LDL CHOLESTEROL, DIRECT: Direct LDL: 85 mg/dL

## 2017-01-26 MED ORDER — TRAMADOL HCL 50 MG PO TABS: 50.0000 mg | ORAL_TABLET | Freq: Four times a day (QID) | ORAL | 0 refills | Status: DC | PRN

## 2017-01-26 NOTE — Assessment & Plan Note (Signed)
Rate controlled. Asymptomatic. Continue to follow with cardiology.

## 2017-01-26 NOTE — Assessment & Plan Note (Signed)
History of kidney stones. No recent hematuria. He will see urology as planned.

## 2017-01-26 NOTE — Progress Notes (Signed)
  [name], MD Phone: [phone]  Carlos Crosby is a 61 y.o. male who presents today for follow-up.  A. fib: Taking Eliquis, digoxin, metoprolol, verapamil. No palpitations or bleeding. Follows with cardiology in October.  Kidney stones: Sees urology later this month. He has had 5 stones in his life. No recent hematuria.  Hyperlipidemia: Take pravastatin. No chest pain, claudication, myalgias, or right upper quadrant pain.  Patient occasionally notes his feet feel cold at times. He will put socks on them and they will improve.  Osteoarthritis of the knees: Followed by orthopedics. They've discussed repeating Synvisc injections. He takes tramadol for his knees and this is beneficial.  He notes some postnasal drip and rhinorrhea related to allergies. No ear pain. No congestion. He takes Benadryl intermittently for this.  PMH: nonsmoker.   ROS see history of present illness  Objective  Physical Exam Vitals:   01/26/17 0839  BP: 130/80  Pulse: 75  Temp: 97.9 F (36.6 C)  SpO2: 98%    BP Readings from Last 3 Encounters:  01/26/17 130/80  10/26/16 122/84  09/15/16 122/88   Wt Readings from Last 3 Encounters:  01/26/17 (!) 345 lb (156.5 kg)  10/26/16 (!) 342 lb 9.6 oz (155.4 kg)  09/15/16 (!) 338 lb 8 oz (153.5 kg)    Physical Exam  Constitutional: No distress.  HENT:  Head: Normocephalic and atraumatic.  Mouth/Throat: Oropharynx is clear and moist. No oropharyngeal exudate.  Normal TMs  Eyes: Pupils are equal, round, and reactive to light. Conjunctivae are normal.  Neck: Neck supple.  Cardiovascular: Normal rate and normal heart sounds.  An irregularly irregular rhythm present.  Pulmonary/Chest: Effort normal and breath sounds normal.  Musculoskeletal: He exhibits no edema.  Right knee with slight bony prominence and swelling that is chronic, left knee with minimal swelling that is chronic, no bony prominence, neither knee is tender  Lymphadenopathy:    He  has no cervical adenopathy.  Neurological: He is alert.  Skin: Skin is warm and dry. He is not diaphoretic.     Assessment/Plan: Please see individual problem list.  Atrial fibrillation ([place]) Rate controlled. Asymptomatic. Continue to follow with cardiology.  Osteoarthritis of both knees He is going to see orthopedics. We will refill tramadol for his pain. He'll attempt to lose weight.  Kidney stone History of kidney stones. No recent hematuria. He will see urology as planned.  Hyperlipidemia Continue pravastatin. Check lipid panel.  Allergic rhinitis Symptoms most consistent with allergic rhinitis. Discussed that he could try Claritin over-the-counter for this. As needed Benadryl is okay as well.   Orders Placed This Encounter  Procedures  . Flu Vaccine QUAD 36+ mos IM  . Comp Met (CMET)  . PSA, Medicare  . Lipid Profile  . HgB A1c    Meds ordered this encounter  Medications  . traMADol (ULTRAM) 50 MG tablet    Sig: Take 1 tablet (50 mg total) by mouth every 6 (six) hours as needed.    Dispense:  90 tablet    Refill:  0    [name], MD [place]

## 2017-01-26 NOTE — Assessment & Plan Note (Signed)
He is going to see orthopedics. We will refill tramadol for his pain. He'll attempt to lose weight.

## 2017-01-26 NOTE — Patient Instructions (Addendum)
Nice to see you. You could try Claritin for your allergy symptoms. Please follow up with your orthopedic doctor and urology. Please keep your appointments with cardiology. Please contact us in October to start the process for cologuard. Please contact your insurance company to make sure they cover this.

## 2017-01-26 NOTE — Assessment & Plan Note (Signed)
Continue pravastatin.  Check lipid panel. 

## 2017-01-26 NOTE — Addendum Note (Signed)
Addended by: Leeanne Rio on: 01/26/2017 09:40 AM   Modules accepted: Orders

## 2017-01-26 NOTE — Assessment & Plan Note (Signed)
Symptoms most consistent with allergic rhinitis. Discussed that he could try Claritin over-the-counter for this. As needed Benadryl is okay as well.

## 2017-01-30 ENCOUNTER — Telehealth: Payer: Self-pay | Admitting: Family Medicine

## 2017-01-30 NOTE — Telephone Encounter (Signed)
Pt called back returning your call. Please advise, thank you!  Call pt @ [phone]

## 2017-01-30 NOTE — Telephone Encounter (Signed)
Returned patients call in regards to labs

## 2017-02-02 DIAGNOSIS — G4733 Obstructive sleep apnea (adult) (pediatric): Secondary | ICD-10-CM | POA: Diagnosis not present

## 2017-02-02 DIAGNOSIS — G4730 Sleep apnea, unspecified: Secondary | ICD-10-CM | POA: Diagnosis not present

## 2017-02-20 ENCOUNTER — Other Ambulatory Visit: Payer: Self-pay | Admitting: Internal Medicine

## 2017-02-20 MED ORDER — VERAPAMIL HCL ER 120 MG PO TBCR: 120.0000 mg | EXTENDED_RELEASE_TABLET | Freq: Every day | ORAL | 3 refills | Status: DC

## 2017-02-20 NOTE — Telephone Encounter (Signed)
Refill sent for Verapamil 120 mg to [facility] mail order with 90 day supply and 3 refills.

## 2017-03-07 ENCOUNTER — Other Ambulatory Visit: Payer: Self-pay | Admitting: *Deleted

## 2017-03-07 ENCOUNTER — Encounter: Payer: Self-pay | Admitting: Cardiovascular Disease

## 2017-03-07 MED ORDER — APIXABAN 5 MG PO TABS: 5.0000 mg | ORAL_TABLET | Freq: Two times a day (BID) | ORAL | 3 refills | Status: DC

## 2017-03-07 MED ORDER — APIXABAN 5 MG PO TABS: 5.0000 mg | ORAL_TABLET | Freq: Two times a day (BID) | ORAL | 0 refills | Status: DC

## 2017-03-10 ENCOUNTER — Telehealth: Payer: Self-pay | Admitting: Cardiovascular Disease

## 2017-03-10 NOTE — Telephone Encounter (Signed)
Spoke with patient regarding patient assistance forms for his Eliquis. Instructed him to come by and pick up assistance forms to complete and to bring them back for [place] to submit. Prescription and page 4 of 4 for prescriber has been completed by Dr. Rockey Situ and placed in [place] folder in "To Do" bin on Mrk Buzby's desk pending patients forms. Patient was appreciative for the call with no further questions at this time.

## 2017-03-13 DIAGNOSIS — G4733 Obstructive sleep apnea (adult) (pediatric): Secondary | ICD-10-CM | POA: Diagnosis not present

## 2017-03-13 DIAGNOSIS — G4730 Sleep apnea, unspecified: Secondary | ICD-10-CM | POA: Diagnosis not present

## 2017-03-15 ENCOUNTER — Encounter: Payer: Self-pay | Admitting: Family Medicine

## 2017-03-16 NOTE — Telephone Encounter (Signed)
Patient will come by within the next few days to sign

## 2017-03-17 ENCOUNTER — Telehealth: Payer: Self-pay | Admitting: Family Medicine

## 2017-03-17 NOTE — Telephone Encounter (Signed)
Advised patient not to go to the hospital but if his power is out for an extended amount of time he should go get motel room

## 2017-03-17 NOTE — Telephone Encounter (Signed)
Pt called and stated that he uses a CPAP machine and he has no power, and may not get it back till Tuesday. Pt states that he does not want to die because he isn't using it. Does he need to go to the hospital? Please advise, thank you!

## 2017-03-17 NOTE — Telephone Encounter (Signed)
Call pt @ [phone]

## 2017-03-20 ENCOUNTER — Other Ambulatory Visit: Payer: Self-pay | Admitting: Family Medicine

## 2017-03-21 ENCOUNTER — Ambulatory Visit (INDEPENDENT_AMBULATORY_CARE_PROVIDER_SITE_OTHER): Payer: Medicare HMO

## 2017-03-21 ENCOUNTER — Other Ambulatory Visit: Payer: Self-pay

## 2017-03-21 DIAGNOSIS — I4821 Permanent atrial fibrillation: Secondary | ICD-10-CM

## 2017-03-21 DIAGNOSIS — I482 Chronic atrial fibrillation: Secondary | ICD-10-CM | POA: Diagnosis not present

## 2017-03-21 MED ORDER — TRAMADOL HCL 50 MG PO TABS: 50.0000 mg | ORAL_TABLET | Freq: Four times a day (QID) | ORAL | 0 refills | Status: DC | PRN

## 2017-03-21 NOTE — Telephone Encounter (Signed)
please advise for refills, thanks

## 2017-03-21 NOTE — Telephone Encounter (Signed)
PT dropped off patient assistance forms to be finished  Placed in Nurse Box

## 2017-03-21 NOTE — Telephone Encounter (Signed)
Please fax

## 2017-03-21 NOTE — Telephone Encounter (Signed)
faxed

## 2017-03-22 ENCOUNTER — Telehealth: Payer: Self-pay | Admitting: Cardiovascular Disease

## 2017-03-22 NOTE — Telephone Encounter (Signed)
Assistance forms for Eliquis completed and faxed with confirmation. Placed forms in "Faxed" bin on Tajae Rybicki's desk.

## 2017-03-27 ENCOUNTER — Telehealth: Payer: Self-pay

## 2017-03-27 NOTE — Telephone Encounter (Signed)
Pt is aware and agreeable to stable heart muscle function

## 2017-03-30 ENCOUNTER — Other Ambulatory Visit
Admission: RE | Admit: 2017-03-30 | Discharge: 2017-03-30 | Disposition: A | Discharge status: Home / Self Care | Payer: Medicare HMO | Source: Ambulatory Visit | Attending: Internal Medicine | Admitting: Internal Medicine

## 2017-03-30 ENCOUNTER — Ambulatory Visit (INDEPENDENT_AMBULATORY_CARE_PROVIDER_SITE_OTHER): Payer: Medicare HMO | Admitting: Internal Medicine

## 2017-03-30 ENCOUNTER — Encounter: Payer: Self-pay | Admitting: Internal Medicine

## 2017-03-30 VITALS — BP 130/80 | HR 98 | Ht 70.0 in | Wt 339.0 lb

## 2017-03-30 DIAGNOSIS — I482 Chronic atrial fibrillation: Secondary | ICD-10-CM

## 2017-03-30 DIAGNOSIS — Z79899 Other long term (current) drug therapy: Secondary | ICD-10-CM

## 2017-03-30 DIAGNOSIS — I10 Essential (primary) hypertension: Secondary | ICD-10-CM

## 2017-03-30 DIAGNOSIS — Z5181 Encounter for therapeutic drug level monitoring: Secondary | ICD-10-CM | POA: Diagnosis not present

## 2017-03-30 DIAGNOSIS — I4821 Permanent atrial fibrillation: Secondary | ICD-10-CM

## 2017-03-30 LAB — BASIC METABOLIC PANEL
Anion gap: 11 (ref 5–15)
BUN: 17 mg/dL (ref 6–20)
CO2: 23 mmol/L (ref 22–32)
Calcium: 9.5 mg/dL (ref 8.9–10.3)
Chloride: 104 mmol/L (ref 101–111)
Creatinine, Ser: 1.01 mg/dL (ref 0.61–1.24)
GFR calc Af Amer: >60 mL/min (ref >60)
GFR calc non Af Amer: >60 mL/min (ref >60)
Glucose, Bld: 120 mg/dL — ABNORMAL HIGH (ref 65–99)
Potassium: 4.2 mmol/L (ref 3.5–5.1)
Sodium: 138 mmol/L (ref 135–145)

## 2017-03-30 LAB — CBC WITH DIFFERENTIAL/PLATELET
Basophils Absolute: 0.1 10*3/uL (ref 0–0.1)
Basophils Relative: 1 %
Eosinophils Absolute: 0.1 10*3/uL (ref 0–0.7)
Eosinophils Relative: 2 %
HCT: 47.9 % (ref 40.0–52.0)
Hemoglobin: 16.3 g/dL (ref 13.0–18.0)
Lymphocytes Relative: 32 %
Lymphs Abs: 2.1 10*3/uL (ref 1.0–3.6)
MCH: 33.5 pg (ref 26.0–34.0)
MCHC: 34.1 g/dL (ref 32.0–36.0)
MCV: 98.3 fL (ref 80.0–100.0)
Monocytes Absolute: 0.5 10*3/uL (ref 0.2–1.0)
Monocytes Relative: 8 %
Neutro Abs: 3.8 10*3/uL (ref 1.4–6.5)
Neutrophils Relative %: 57 %
Platelets: 230 10*3/uL (ref 150–440)
RBC: 4.87 MIL/uL (ref 4.40–5.90)
RDW: 13.2 % (ref 11.5–14.5)
WBC: 6.6 10*3/uL (ref 3.8–10.6)

## 2017-03-30 LAB — DIGOXIN LEVEL: Digoxin Level: 0.5 ng/mL — ABNORMAL LOW (ref 0.8–2.0)

## 2017-03-30 MED ORDER — RIVAROXABAN 20 MG PO TABS: 20.0000 mg | ORAL_TABLET | Freq: Every day | ORAL | 3 refills | Status: DC

## 2017-03-30 NOTE — Patient Instructions (Signed)
Medication Instructions:  Your physician has recommended you make the following change in your medication:  1- START Xarelto 20 mg (1 tablet) by mouth once a day with supper.  2- STOP TAKING Eliquis while on the Xarelto.   Labwork: Your physician recommends that you return for lab work in: TODAY (CBC, BMET, DIG). - Please go to the [facility]. You will check in at the front desk to the right as you walk into the atrium. Valet Parking is offered if needed.    Testing/Procedures: none  Follow-Up: Your physician wants you to follow-up in: [place]. You will receive a reminder letter in the mail two months in advance. If you don't receive a letter, please call our office to schedule the follow-up appointment.   If you need a refill on your cardiac medications before your next appointment, please call your pharmacy.

## 2017-03-30 NOTE — Progress Notes (Signed)
Patient Care Team: Leone Haven, MD as PCP - General (Family Medicine)   HPI  Carlos Crosby is a 61 y.o. male Seen in follow-up because of persistent atrial fibrillation dating back to 2014. He declined catheter ablation at [facility] at that time. A strategy of rate control had been pursued; it has been largely unsuccessful. Interval assessment of left ventricular systolic function has demonstrated significant worsening with an ejection fraction of 30-35%. This was 12/16. Repeat echocardiogram last week demonstrates no interval improvement. DATE TEST    12/16    Echo    EF 30-35 %   6/17    Echo   EF 45-50 %   7/17 Myoview EF 42% No ischemia  10/18 Echo EF 40-45%     DATE TEST Mean HR   2/16    Holter 93 (52-185)      6/17    Holter 75(38-132)         Date CR K Hgb Dig  1/17     1.1  8/18 0.99 4.6       with minimal SOB major limitation is his knees  Hemoglobin A1c recently was normal  Past Medical History:  Diagnosis Date  . Chronic a-fib ([place])   . Chronic systolic CHF (congestive heart failure) ([place])   . Depression   . History of kidney stones   . History of pulmonary embolism   . Hyperlipidemia   . Hypertension   . Migraine   . Obesity   . OSA (obstructive sleep apnea)    on CPAP  . Osteoarthritis    bilateral knees  . Rocky Mountain spotted fever 2011    Past Surgical History:  Procedure Laterality Date  . CARDIAC CATHETERIZATION  2012   [facility]  . KNEE SURGERY     right knee   . LITHOTRIPSY  07/31/2014  . URETERAL STENT PLACEMENT  2016  . URETEROSCOPY      Current Outpatient Prescriptions  Medication Sig Dispense Refill  . acetaminophen (TYLENOL) 500 MG tablet Take 1,000 mg by mouth every 6 (six) hours as needed.    Marland Kitchen apixaban (ELIQUIS) 5 MG TABS tablet Take 1 tablet (5 mg total) by mouth 2 (two) times daily. 60 tablet 0  . digoxin (LANOXIN) 0.25 MG tablet Take one tablet (0.25 mg) by mouth once daily six days a week 90 tablet 3  . furosemide  (LASIX) 40 MG tablet Take 40 mg by mouth daily as needed.     Marland Kitchen lisinopril (PRINIVIL,ZESTRIL) 2.5 MG tablet TAKE 1 TABLET EVERY DAY 90 tablet 3  . metoprolol succinate (TOPROL-XL) 100 MG 24 hr tablet Take two tablets (200 mg) in the morning & take one tablet (100 mg) in the evening 270 tablet 3  . Multiple Vitamins-Minerals (CENTRUM SILVER ADULT 50+) TABS Take 1 tablet by mouth daily.    . Potassium 95 MG TABS Take 1 tablet by mouth.    . pravastatin (PRAVACHOL) 80 MG tablet Take 1 tablet (80 mg total) by mouth every evening. 90 tablet 3  . traMADol (ULTRAM) 50 MG tablet Take 1 tablet (50 mg total) by mouth every 6 (six) hours as needed. 90 tablet 0  . verapamil (CALAN-SR) 120 MG CR tablet Take 1 tablet (120 mg total) by mouth daily. 90 tablet 3   No current facility-administered medications for this visit.     Allergies  Allergen Reactions  . Allopurinol Hives and Swelling  . Diltiazem Itching and Rash  .  Other Hives  . Penicillin G Nausea Only    And dirrhea  . Penicillins Other (See Comments)    Pt unsure of rxn; showed up on allergy test  . Ondansetron Rash      Review of Systems negative except from HPI and PMH  Physical Exam BP 130/80 (BP Location: Left Arm, Patient Position: Sitting, Cuff Size: Normal)   Pulse 98   Ht 5\' 10"  (1.778 m)   Wt (!) 339 lb (153.8 kg)   BMI 48.64 kg/m  Well developed and Morbidly obese in no acute distress Well developed and nourished in no acute distress HENT normal Neck supple with JVP-flat Clear Irregularly irregular rate and rhythm, no murmurs or gallops Abd-soft with active BS without hepatomegaly No Clubbing cyanosis tr edema Skin-warm and dry A & Oriented  Grossly normal sensory and motor function  ECG  Afib 98 Personally reviewed   -/09/35  Assessment and  Plan   Afib  now controlled ventricular response  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

## 2017-04-03 ENCOUNTER — Telehealth: Payer: Self-pay

## 2017-04-03 NOTE — Telephone Encounter (Signed)
Pt is aware and agreeable to normal results  

## 2017-04-26 ENCOUNTER — Other Ambulatory Visit: Payer: Self-pay | Admitting: Family Medicine

## 2017-04-26 NOTE — Telephone Encounter (Signed)
Last OV 01/26/17 last filled 03/21/17 90 0rf patient states he has enough until tuesday

## 2017-04-27 ENCOUNTER — Other Ambulatory Visit: Payer: Self-pay | Admitting: Family Medicine

## 2017-05-02 MED ORDER — TRAMADOL HCL 50 MG PO TABS: 50.0000 mg | ORAL_TABLET | Freq: Four times a day (QID) | ORAL | 1 refills | Status: DC | PRN

## 2017-05-02 NOTE — Telephone Encounter (Signed)
Last OV 01/26/17 last filled 03/21/17 90 0rf

## 2017-05-03 NOTE — Telephone Encounter (Signed)
faxed

## 2017-05-04 NOTE — Progress Notes (Signed)
Cardiology Office Note  Date:  05/05/2017   ID:  RYAN OGBORN, DOB 01-Jun-1956, MRN [id number]  PCP:  Leone Haven, MD   Chief Complaint  Patient presents with  . other    12 month follow up. "doing well."     HPI:  Mr. Latonya Knight is a 61 year old gentleman with  morbid obesity,  chronic atrial fibrillation (previously on tikosyn),  chronic systolic CHF with ejection fraction 25% in 2012,  cardiac cath: no CAD,   obstructive sleep apnea on CPAP,  hypertension,  Hyperlipidemia,  recurrent renal stones,  osteoarthritis of the knees bilaterally, Previously noted to have elevated ventricular rate in the setting of anxiety, such as before procedures  depression  who presents for routine follow-up of his atrial fibrillation   In follow-up today he reports continued problems with his severe joint pain, mobility Attributes this to his morbid obesity He does swimming, unable to treadmill or bike Unable to lose weight despite his best efforts and changing his diet  Changed to xarelto for cost, previously on Eliquis Taking Lasix only as needed, denies having significant leg swelling  Echo reviewed with him in detail on today's visit EF 45 to 50%, stable function  Monitor his heart rate using home monitoring system Rate well controlled, 70 to 80 BPM at rest  EKG personally reviewed by myself on todays visit Shows atrial fibrillation ventricular rate 82 bpm nonspecific ST abnormality  Other past medical history reviewed ECHO 11/24/2015: EF 45 to 50% Stress test : 12/21/2015 No ischemia, EF 42% echo in 05/2015: EF 30 to 35%  Labs: LDL 65, total 147  outpatient left lithotripsy 08/28/2014  found to have atrial fibrillation with RVR with heart rates up to 170s prior to the procedure and this was canceled. He was kept overnight in the hospital and heart rate returned to normal on his regular medication regiment, metoprolol succinate 100 mg twice a day  As an outpatient,  he went repeat lithotripsy with Dr. Eliberto Ivory several weeks ago and was given Xanax the night before, morning of the procedure and extra metoprolol succinate 50 mg the night before is the morning of the procedure. He reports that his heart rate was well-controlled, Dr. Eliberto Ivory attempted the lithotripsy but was unsuccessful Lithotripsy repeated at a later date Recent removal of stent in the left urethra  PMH:   has a past medical history of Chronic a-fib ([place]), Chronic systolic CHF (congestive heart failure) ([place]), Depression, History of kidney stones, History of pulmonary embolism, Hyperlipidemia, Hypertension, Migraine, Obesity, OSA (obstructive sleep apnea), Osteoarthritis, and Rocky Mountain spotted fever (2011).  PSH:    Past Surgical History:  Procedure Laterality Date  . CARDIAC CATHETERIZATION  2012   [facility]  . KNEE SURGERY     right knee   . LITHOTRIPSY  07/31/2014  . URETERAL STENT PLACEMENT  2016  . URETEROSCOPY      Current Outpatient Medications  Medication Sig Dispense Refill  . acetaminophen (TYLENOL) 500 MG tablet Take 1,000 mg by mouth every 6 (six) hours as needed.    . digoxin (LANOXIN) 0.25 MG tablet Take one tablet (0.25 mg) by mouth once daily six days a week 90 tablet 3  . furosemide (LASIX) 40 MG tablet Take 40 mg by mouth daily as needed.     Marland Kitchen lisinopril (PRINIVIL,ZESTRIL) 2.5 MG tablet TAKE 1 TABLET EVERY DAY 90 tablet 3  . metoprolol succinate (TOPROL-XL) 100 MG 24 hr tablet Take two tablets (200 mg) in the morning &  take one tablet (100 mg) in the evening 270 tablet 3  . Multiple Vitamins-Minerals (CENTRUM SILVER ADULT 50+) TABS Take 1 tablet by mouth daily.    . Potassium 95 MG TABS Take 1 tablet by mouth.    . pravastatin (PRAVACHOL) 80 MG tablet Take 1 tablet (80 mg total) by mouth every evening. 90 tablet 3  . rivaroxaban (XARELTO) 20 MG TABS tablet Take 1 tablet (20 mg total) by mouth daily with supper. 30 tablet 3  . traMADol (ULTRAM) 50 MG tablet Take 1 tablet (50  mg total) by mouth every 6 (six) hours as needed. 90 tablet 1  . verapamil (CALAN-SR) 120 MG CR tablet Take 1 tablet (120 mg total) by mouth daily. 90 tablet 3   No current facility-administered medications for this visit.      Allergies:   Allopurinol; Diltiazem; Other; Penicillin g; Penicillins; and Ondansetron   Social History:  The patient  reports that  has never smoked. he has never used smokeless tobacco. He reports that he drinks alcohol. He reports that he does not use drugs.   Family History:   family history includes Arthritis in his father; Cancer in his father and paternal grandfather; Hypertension in his father.    Review of Systems: Review of Systems  Constitutional: Negative.   Respiratory: Negative.   Cardiovascular: Negative.   Gastrointestinal: Negative.   Musculoskeletal: Positive for joint pain.  Neurological: Negative.   Psychiatric/Behavioral: Negative.   All other systems reviewed and are negative.    PHYSICAL EXAM: VS:  BP 122/90 (BP Location: Left Arm, Patient Position: Sitting, Cuff Size: Large)   Pulse 82   Ht 5\' 10"  (1.778 m)   Wt (!) 348 lb 8 oz (158.1 kg)   BMI 50.00 kg/m  , BMI Body mass index is 50 kg/m. GEN: Well nourished, well developed, in no acute distress, morbidly obese  HEENT: normal  Neck: no JVD, carotid bruits, or masses Cardiac: Irregularly irregular, tachycardic, no murmurs, rubs, or gallops,no edema  Respiratory:  clear to auscultation bilaterally, normal work of breathing GI: soft, nontender, nondistended, + BS MS: no deformity or atrophy  Skin: warm and dry, no rash Neuro:  Strength and sensation are intact Psych: euthymic mood, full affect    Recent Labs: 01/26/2017: ALT 21 03/30/2017: BUN 17; Creatinine, Ser 1.01; Hemoglobin 16.3; Platelets 230; Potassium 4.2; Sodium 138    Lipid Panel Lab Results  Component Value Date   CHOL 146 01/26/2017   HDL 34.40 (L) 01/26/2017   TRIG 246.0 (H) 01/26/2017      Wt  Readings from Last 3 Encounters:  05/05/17 (!) 348 lb 8 oz (158.1 kg)  03/30/17 (!) 339 lb (153.8 kg)  01/26/17 (!) 345 lb (156.5 kg)       ASSESSMENT AND PLAN:  Permanent atrial fibrillation (HCC) Rate relatively well controlled, samples given today of Xarelto Did not qualify for company assistance  Essential hypertension Blood pressure is well controlled on today's visit. No changes made to the medications. Stable  Pure hypercholesterolemia Cholesterol is at goal on the current lipid regimen. No changes to the medications were made. Stable  Morbid obesity ([place]) We have encouraged continued exercise, careful diet management in an effort to lose weight. Long discussion concerning gastric bypass surgery He would be a candidate from a cardiac perspective No further testing needed.  Stable ejection fraction, heart rate well controlled Would greatly benefit from weight loss surgery in terms of cardiac risk factors, chronic pain, hypertension, hyperlipidemia  Anxiety Appears to be relatively well controlled Stable  Obstructive sleep apnea on CPAP  compliant on his CPAP  Chronic diastolic congestive heart failure (HCC) We'll continue Lasix as needed for leg swelling   Total encounter time more than 25 minutes  Greater than 50% was spent in counseling and coordination of care with the patient   Disposition:   F/U  12 months    Signed, Esmond Plants, M.D., Ph.D. 05/05/2017  [place], [place]

## 2017-05-05 ENCOUNTER — Encounter: Payer: Self-pay | Admitting: Cardiovascular Disease

## 2017-05-05 ENCOUNTER — Ambulatory Visit (INDEPENDENT_AMBULATORY_CARE_PROVIDER_SITE_OTHER): Payer: Medicare HMO | Admitting: Cardiovascular Disease

## 2017-05-05 VITALS — BP 122/90 | HR 82 | Ht 70.0 in | Wt 348.5 lb

## 2017-05-05 DIAGNOSIS — I482 Chronic atrial fibrillation: Secondary | ICD-10-CM | POA: Diagnosis not present

## 2017-05-05 DIAGNOSIS — E7800 Pure hypercholesterolemia, unspecified: Secondary | ICD-10-CM | POA: Diagnosis not present

## 2017-05-05 DIAGNOSIS — I4821 Permanent atrial fibrillation: Secondary | ICD-10-CM

## 2017-05-05 DIAGNOSIS — I10 Essential (primary) hypertension: Secondary | ICD-10-CM

## 2017-05-05 DIAGNOSIS — I5032 Chronic diastolic (congestive) heart failure: Secondary | ICD-10-CM | POA: Diagnosis not present

## 2017-05-05 MED ORDER — DIGOXIN 250 MCG PO TABS: ORAL_TABLET | ORAL | 1 refills | Status: DC

## 2017-05-05 NOTE — Patient Instructions (Addendum)
Medication Instructions:   No medication changes made Medication Samples have been provided to the patient.  Drug name: Xarelto       Strength: 20 mg        Qty: 2 bottles  LOT: [id number]  Exp.Date: 3/21   Labwork:  No new labs needed  Testing/Procedures:  No further testing at this time   Follow-Up: It was a pleasure seeing you in the office today. Please call us if you have new issues that need to be addressed before your next appt.  [phone]  Your physician wants you to follow-up in: 12 months.  You will receive a reminder letter in the mail two months in advance. If you don't receive a letter, please call our office to schedule the follow-up appointment.  If you need a refill on your cardiac medications before your next appointment, please call your pharmacy.

## 2017-05-09 ENCOUNTER — Encounter: Payer: Self-pay | Admitting: Cardiovascular Disease

## 2017-05-12 ENCOUNTER — Encounter: Payer: Self-pay | Admitting: Cardiovascular Disease

## 2017-05-12 ENCOUNTER — Telehealth: Payer: Self-pay | Admitting: *Deleted

## 2017-05-12 NOTE — Telephone Encounter (Signed)
Prior Authorization forms for Eliquis and Digoxin completed, signed by Dr Rockey Situ, and faxed to [facility]. Awaiting approval.

## 2017-05-13 ENCOUNTER — Encounter: Payer: Self-pay | Admitting: Internal Medicine

## 2017-05-13 ENCOUNTER — Encounter: Payer: Self-pay | Admitting: Cardiovascular Disease

## 2017-05-17 ENCOUNTER — Ambulatory Visit: Payer: Medicare HMO | Admitting: Family Medicine

## 2017-05-18 ENCOUNTER — Ambulatory Visit: Payer: Medicare HMO | Admitting: Family Medicine

## 2017-05-18 ENCOUNTER — Other Ambulatory Visit: Payer: Self-pay

## 2017-05-18 ENCOUNTER — Encounter: Payer: Self-pay | Admitting: Family Medicine

## 2017-05-18 ENCOUNTER — Encounter: Payer: Self-pay | Admitting: Internal Medicine

## 2017-05-18 VITALS — BP 110/78 | HR 57 | Temp 97.6°F | Wt 348.6 lb

## 2017-05-18 DIAGNOSIS — R040 Epistaxis: Secondary | ICD-10-CM | POA: Insufficient documentation

## 2017-05-18 DIAGNOSIS — R319 Hematuria, unspecified: Secondary | ICD-10-CM | POA: Diagnosis not present

## 2017-05-18 DIAGNOSIS — G4733 Obstructive sleep apnea (adult) (pediatric): Secondary | ICD-10-CM

## 2017-05-18 DIAGNOSIS — I5032 Chronic diastolic (congestive) heart failure: Secondary | ICD-10-CM | POA: Diagnosis not present

## 2017-05-18 DIAGNOSIS — Z9989 Dependence on other enabling machines and devices: Secondary | ICD-10-CM

## 2017-05-18 DIAGNOSIS — N200 Calculus of kidney: Secondary | ICD-10-CM | POA: Diagnosis not present

## 2017-05-18 NOTE — Assessment & Plan Note (Addendum)
Patient will keep his appointment with bariatric surgery.  I discussed that this would be a fantastic idea given his BMI of 50 and his orthopedic issues along with his obstructive sleep apnea and hyperlipidemia.  Patient has not really been able to lose weight with swimming.  We will forward our note to his surgeon.

## 2017-05-18 NOTE — Assessment & Plan Note (Signed)
Monitored through cardiology.

## 2017-05-18 NOTE — Patient Instructions (Signed)
Nice to see you. We will fax our note to the surgeon that is going to be seeing you for bariatric surgery. We will get you to see urology as well. Please continue your CPAP.

## 2017-05-18 NOTE — Assessment & Plan Note (Signed)
Refer to urology.  ?

## 2017-05-18 NOTE — Progress Notes (Signed)
Carlos Rumps, MD Phone: [phone]  Carlos Crosby is a 61 y.o. male who presents today for follow-up.  Morbid obesity: Patient recently looked into getting bariatric surgery.  This was brought up by his cardiologist.  He has an appointment with Dr. Johnathan Crosby at [facility] surgery in [place].  He needs our notes faxed to him.  Patient's not doing anything diet wise right now.  He knows he is going to have to see a nutritionist and have 6 months of a prescribed diet.  He does swim for exercise.  He mostly wants to lose weight given his significant knee pain.  Obstructive sleep apnea: Uses a CPAP nightly.  Wears it for 7-8 hours.  He does wake up well rested.  Rare sleepiness during the day.  Recently saw his cardiologist and notes his echo is significantly improved.  They will continue to monitor.  Kidney stone: Had this earlier this year.  He never followed up with urology.  He has had no recurrent hematuria.  High protein diets seem to increase his stone risk.  He reports a very mild nosebleed stopped within several seconds.  He is on Xarelto.  No other bleeding issues.  Social History   Tobacco Use  Smoking Status Never Smoker  Smokeless Tobacco Never Used     ROS see history of present illness  Objective  Physical Exam Vitals:   05/18/17 0819  BP: 110/78  Pulse: (!) 57  Temp: 97.6 F (36.4 C)  SpO2: 96%    BP Readings from Last 3 Encounters:  05/18/17 110/78  05/05/17 122/90  03/30/17 130/80   Wt Readings from Last 3 Encounters:  05/18/17 (!) 348 lb 9.6 oz (158.1 kg)  05/05/17 (!) 348 lb 8 oz (158.1 kg)  03/30/17 (!) 339 lb (153.8 kg)    Physical Exam  Constitutional: No distress.  HENT:  Mild irritation right nasal mucosa just at the opening of his nare  Cardiovascular: Normal heart sounds. An irregularly irregular rhythm present.  Pulmonary/Chest: Effort normal and breath sounds normal.  Musculoskeletal: He exhibits no edema.    Neurological: He is alert.  Skin: Skin is warm and dry. He is not diaphoretic.     Assessment/Plan: Please see individual problem list.  Morbid obesity Patient will keep his appointment with bariatric surgery.  I discussed that this would be a fantastic idea given his BMI of 50 and his orthopedic issues along with his obstructive sleep apnea and hyperlipidemia.  Patient has not really been able to lose weight with swimming.  We will forward our note to his surgeon.  Hematuria Felt to be due a kidney stone previously.  We will refer him back to urology given that he never kept that appointment.  Kidney stone Refer to urology.  Obstructive sleep apnea on CPAP Continue CPAP.  Chronic diastolic congestive heart failure Monitored through cardiology.  Bleeding nose Minimal issues with this earlier.  Asymptomatic currently.  Discussed appropriate manner to get this stopped.  Advised if he is not able to get it stopped relatively quickly he needs to be evaluated.  The patient can use nasal saline to keep his nasal passages moist.   Carlos Crosby was seen today for follow-up.  Diagnoses and all orders for this visit:  Kidney stone -     Ambulatory referral to Urology  Morbid obesity ([place])  Hematuria, unspecified type  Obstructive sleep apnea on CPAP  Chronic diastolic congestive heart failure ([place])  Bleeding nose    Orders Placed This  Encounter  Procedures  . Ambulatory referral to Urology    Referral Priority:   Routine    Referral Type:   Consultation    Referral Reason:   Specialty Services Required    Requested Specialty:   Urology    Number of Visits Requested:   1    No orders of the defined types were placed in this encounter.  Health maintenance: Cologuard form signed.  Patient will complete this at home.  We will request Tdap records.  Carlos Rumps, MD [place]

## 2017-05-18 NOTE — Assessment & Plan Note (Signed)
Continue CPAP.  

## 2017-05-18 NOTE — Assessment & Plan Note (Signed)
Minimal issues with this earlier.  Asymptomatic currently.  Discussed appropriate manner to get this stopped.  Advised if he is not able to get it stopped relatively quickly he needs to be evaluated.  The patient can use nasal saline to keep his nasal passages moist.

## 2017-05-18 NOTE — Progress Notes (Signed)
faxed

## 2017-05-18 NOTE — Assessment & Plan Note (Signed)
Felt to be due a kidney stone previously.  We will refer him back to urology given that he never kept that appointment.

## 2017-05-19 ENCOUNTER — Encounter: Payer: Self-pay | Admitting: *Deleted

## 2017-05-19 NOTE — Telephone Encounter (Signed)
I called and spoke with the patient to follow up on reported 11 lb weight gain over night and symptoms. Per the patient, he

## 2017-05-23 ENCOUNTER — Ambulatory Visit: Payer: Self-pay | Admitting: Urology

## 2017-06-06 ENCOUNTER — Other Ambulatory Visit: Payer: Self-pay | Admitting: Cardiovascular Disease

## 2017-06-06 ENCOUNTER — Encounter: Payer: Self-pay | Admitting: Cardiovascular Disease

## 2017-06-07 ENCOUNTER — Other Ambulatory Visit: Payer: Self-pay | Admitting: *Deleted

## 2017-06-08 ENCOUNTER — Encounter: Payer: Medicare HMO | Attending: Surgery | Admitting: Dietician

## 2017-06-08 ENCOUNTER — Other Ambulatory Visit: Payer: Self-pay | Admitting: *Deleted

## 2017-06-08 ENCOUNTER — Encounter: Payer: Self-pay | Admitting: Dietician

## 2017-06-08 VITALS — Ht 71.0 in | Wt 342.8 lb

## 2017-06-08 DIAGNOSIS — Z713 Dietary counseling and surveillance: Secondary | ICD-10-CM | POA: Diagnosis not present

## 2017-06-08 DIAGNOSIS — Z6841 Body Mass Index (BMI) 40.0 and over, adult: Secondary | ICD-10-CM

## 2017-06-08 MED ORDER — METOPROLOL SUCCINATE ER 100 MG PO TB24: ORAL_TABLET | ORAL | 3 refills | Status: DC

## 2017-06-08 MED ORDER — APIXABAN 5 MG PO TABS: 5.0000 mg | ORAL_TABLET | Freq: Two times a day (BID) | ORAL | 3 refills | Status: DC

## 2017-06-08 MED ORDER — PRAVASTATIN SODIUM 80 MG PO TABS: 80.0000 mg | ORAL_TABLET | Freq: Every evening | ORAL | 3 refills | Status: DC

## 2017-06-08 NOTE — Patient Instructions (Signed)
   Control portions of carbohydrate foods, ideally keep to fist-size (1 cup) or less per meal. Include some whole grain options like brown rice, or starchy veg such as peas or beans or corn as your starch for the meal.  Include some low-carb veggies as tolerated, and/or fruits.   Add only small amounts of fats to season foods (butter, marg., mayo, sour cream, salad dressing).

## 2017-06-08 NOTE — Progress Notes (Signed)
Nutritional Assessment:       Date: 06/08/17  Re: Carlos Crosby  DOB: 13-Apr-2056   MRN: [id number]  Proposed Surgery: sleeve gastrectomy   MD: Johnathan Hausen, MD RD:  Erlene Quan, MEd, RD, LDN  Height 5'11"  Weight 342.8lbs with shoes and jacket  BMI 47.81  Upper IBW/% UIBW 189lbs/ 181%  Patient's goal weight 200lbs; will discuss with MD for realistic goal  Medical History HTN, HLD, A-fib, history of CHF, Gout, sleep apnea  Medications/Supplements Acetaminophen, eliquis, digoxin, furosemide (as needed), lisinopril, metoprolol succinate, MVI, Potassium, pravastatin, traMADol (as needed), verapamil  Previous Surgeries Knee surgery; states he needs double knee replacements, knee pain affects walking and physical activity  Drug Allergies Allopurinol, Diltiazem, Penicillin, Ondansetron  Food Allergies Strawberries, clams  Drink Alcohol Rarely; 3 times a year or less.   Smoke Cigarettes Never   Physical Activity Swimming 120 minutes, 3 times a week  Weight History Lost over 100lbs several years ago, regained weight when he had to stop the diet due to kidney stones, and after daughter passed away;  lost to under 300lbs on his own version of high protein, low carb diet ut again developed kidney stones  Dieting/Weight Loss History Atkins diet and other high protein, low carbohydrate plans   Dietary Recall/Food and Fluid: Mr Word lives with his wife. He performs the food shopping and meal preparation.  Dining out: not on a regular basis, less than once a week.  Typical eating pattern: 2-3 meals and 1-2 snacks daily  Breakfast: 06/08/17 corned beef hash (can); eggs, pancakes, bacon Snack: none Lunch: burger some without bread, maybe homemade fries; hot dog no bun with cheese Snack: none; occasionally pistachios or cheese Supper: 06/08/17 spaghetti with meat balls; 06/07/17 steak, bread; beef stroganoff; sausage and peppers and onions Snack: sometimes air-popped popcorn with some butter; rarely  chocolate Beverages: water and sugar free flavored water only  Psychosocial:                                                                                                                                       He has eaten some large portions, such as 1 bag of pistachios, but no binge eating, or other disordered eating behaviors.  He does occasionally engage in stress eating, either larger food portions or poor choices.   Instruction:   Mr. Stonehocker has been instructed on the following: . Pre-op dietary changes, particularly limiting added fats, controlling portions of carbs, keeping caloric intake to about 1800kcal daily.  . Pre-op liver reduction diet.  . Overview of post-op diet changes, including importance of hydration and adequate protein. Marland Kitchen He was commended for researching bariatric procedures and commitment to long-term changes.   Intervention: . Mr. Patella is currently working to decrease intake of starchy foods, and increase vegetable intake. Marland Kitchen He will continue to make dietary improvements prior to surgery.   He is making healthy beverage choices  already. Marland Kitchen He is engaging in physical exercise on a regular basis.  Marland Kitchen He has researched this procedure and its complications by reading information on bariatric surgery websites, has Scientist, product/process development app on his phone.  Marland Kitchen He voices understanding of the instruction listed above  Summary: Mr. Routh appears very motivated for surgery, as he has already begun making diet and lifestyle changes to achieve weight loss.  He will continue to work on dietary changes. Due to these factors and solid support from friends and family, from a nutrition standpoint, he is a good candidate to proceed with the bariatric surgery program.                                                                                                                  Plan: The patient is requested to participate in pre- and post-operative follow-up: monthly weight loss visits for 6  months, pre-op class 2-4 weeks prior to surgery, and post-op visits beginning at 2-3 weeks post-op.    Next RD appointment: 07/10/17

## 2017-06-13 ENCOUNTER — Telehealth: Payer: Self-pay | Admitting: Family Medicine

## 2017-06-13 NOTE — Telephone Encounter (Signed)
Please advise 

## 2017-06-13 NOTE — Telephone Encounter (Signed)
Are the labs in my folder at my desk?  Once I see them I can place orders.  Thanks.

## 2017-06-13 NOTE — Telephone Encounter (Signed)
Pt is needing lab orders put in from Dr. Hassell Done so he wont have to go to [facility] just for labs Paper with labs is in Dr. Tharon Aquas color folder upfront

## 2017-06-14 NOTE — Telephone Encounter (Signed)
Placed on your desk. 

## 2017-06-15 ENCOUNTER — Encounter: Payer: Self-pay | Admitting: Family Medicine

## 2017-06-16 NOTE — Telephone Encounter (Signed)
Please let the patient know I have placed orders though I am not sure how to order the breath tek test. We will need to check into this.  His labs will need to be fasting.  Thanks.

## 2017-06-17 ENCOUNTER — Other Ambulatory Visit: Payer: Self-pay | Admitting: Family Medicine

## 2017-06-19 DIAGNOSIS — Z1212 Encounter for screening for malignant neoplasm of rectum: Secondary | ICD-10-CM | POA: Diagnosis not present

## 2017-06-19 DIAGNOSIS — Z1211 Encounter for screening for malignant neoplasm of colon: Secondary | ICD-10-CM | POA: Diagnosis not present

## 2017-06-19 NOTE — Telephone Encounter (Signed)
Pt returned call. He has scheduled labs.

## 2017-06-19 NOTE — Telephone Encounter (Signed)
Left message to return call, also sent mychart message.

## 2017-06-20 ENCOUNTER — Encounter: Payer: Self-pay | Admitting: Urology

## 2017-06-20 ENCOUNTER — Encounter: Payer: Self-pay | Admitting: Family Medicine

## 2017-06-20 ENCOUNTER — Ambulatory Visit: Payer: Medicare HMO | Admitting: Urology

## 2017-06-20 VITALS — BP 114/78 | HR 88 | Ht 71.0 in | Wt 339.0 lb

## 2017-06-20 DIAGNOSIS — N200 Calculus of kidney: Secondary | ICD-10-CM | POA: Diagnosis not present

## 2017-06-20 LAB — URINALYSIS, COMPLETE
Bilirubin, UA: NEGATIVE
Glucose, UA: NEGATIVE
Ketones, UA: NEGATIVE
Nitrite, UA: NEGATIVE
Protein, UA: NEGATIVE
RBC, UA: NEGATIVE
Specific Gravity, UA: 1.025 (ref 1.005–1.030)
Urobilinogen, Ur: 0.2 mg/dL (ref 0.2–1.0)
pH, UA: 5.5 (ref 5.0–7.5)

## 2017-06-20 LAB — MICROSCOPIC EXAMINATION
Bacteria, UA: NONE SEEN
Epithelial Cells (non renal): NONE SEEN /hpf (ref 0–10)
RBC, UA: NONE SEEN /hpf (ref 0–?)

## 2017-06-20 NOTE — Progress Notes (Signed)
06/20/2017 5:20 PM   Carlos Crosby 15-Apr-1956 [id number]  Referring provider: Leone Haven, MD [address] [place], [place] [address]  Chief Complaint  Patient presents with  . Nephrolithiasis    New patient    HPI: 62 year old male with a present nephrolithiasis to establish care.  History of morbid tentatively scheduled for gastric sleeve later this summer.  As part of his due diligence for preparing, presents today to establish care for history of recurrent nephrolithiasis.  He passed one stone earlier this year spontaneously.   ESWL x 3 and URS x 2  in 2016 ureteral stone due to complications from retained ureteral stone s/p shockwave.   PCNL 2012  Per report, his stones are mixed uric acid and calicum stones.  He has a personal history of gout.  He is allergic to allopurinol.    PSA screening up to date, PSA 0.16 on 01/2017.    He tries to drink plenty of water.  He does watch his sodium.  He develops more stones when he is on a keto diet but this is effective for him for weight loss.  Records from Dr. Letta Kocher office have been requested today.  No recent flank pain or gross hematuria.    PMH: Past Medical History:  Diagnosis Date  . Chronic a-fib ([place])   . Chronic systolic CHF (congestive heart failure) ([place])   . Depression   . History of kidney stones   . History of pulmonary embolism   . Hyperlipidemia   . Hypertension   . Migraine   . Obesity   . OSA (obstructive sleep apnea)    on CPAP  . Osteoarthritis    bilateral knees  . [place] spotted fever 2011    Surgical History: Past Surgical History:  Procedure Laterality Date  . CARDIAC CATHETERIZATION  2012   [facility]  . KNEE SURGERY     right knee   . LITHOTRIPSY  07/31/2014  . URETERAL STENT PLACEMENT  2016  . URETEROSCOPY      Home Medications:  Allergies as of 06/20/2017      Reactions   Allopurinol Hives, Swelling   Diltiazem Itching, Rash   Other Hives   Penicillin  G Nausea Only   And dirrhea   Penicillins Other (See Comments)   Pt unsure of rxn; showed up on allergy test   Ondansetron Rash      Medication List        Accurate as of 06/20/17 11:59 PM. Always use your most recent med list.          acetaminophen 500 MG tablet Commonly known as:  TYLENOL Take 1,000 mg by mouth every 6 (six) hours as needed.   apixaban 5 MG Tabs tablet Commonly known as:  ELIQUIS Take 1 tablet (5 mg total) by mouth 2 (two) times daily.   CENTRUM SILVER ADULT 50+ Tabs Take 1 tablet by mouth daily.   digoxin 0.25 MG tablet Commonly known as:  LANOXIN Take one tablet (0.25 mg) by mouth once daily six days a week   furosemide 40 MG tablet Commonly known as:  LASIX Take 40 mg by mouth daily as needed.   lisinopril 2.5 MG tablet Commonly known as:  PRINIVIL,ZESTRIL TAKE 1 TABLET EVERY DAY   metoprolol succinate 100 MG 24 hr tablet Commonly known as:  TOPROL-XL TAKE 2 TABLETS EVERY MORNING  AND TAKE 1 TABLET EVERY EVENING   Potassium 95 MG Tabs Take 1 tablet by mouth.  pravastatin 80 MG tablet Commonly known as:  PRAVACHOL Take 1 tablet (80 mg total) by mouth every evening.   traMADol 50 MG tablet Commonly known as:  ULTRAM Take 1 tablet (50 mg total) by mouth every 6 (six) hours as needed.   verapamil 120 MG CR tablet Commonly known as:  CALAN-SR Take 1 tablet (120 mg total) by mouth daily.       Allergies:  Allergies  Allergen Reactions  . Allopurinol Hives and Swelling  . Diltiazem Itching and Rash  . Other Hives  . Penicillin G Nausea Only    And dirrhea  . Penicillins Other (See Comments)    Pt unsure of rxn; showed up on allergy test  . Ondansetron Rash    Family History: Family History  Problem Relation Age of Onset  . Cancer Father        lung  . Arthritis Father   . Hypertension Father   . Cancer Paternal Grandfather        Throat cancer    Social History:  reports that  has never smoked. he has never used  smokeless tobacco. He reports that he does not drink alcohol or use drugs.  ROS: UROLOGY Frequent Urination?: No Hard to postpone urination?: No Burning/pain with urination?: No Get up at night to urinate?: No Leakage of urine?: No Urine stream starts and stops?: No Trouble starting stream?: No Do you have to strain to urinate?: No Blood in urine?: No Urinary tract infection?: No Sexually transmitted disease?: No Injury to kidneys or bladder?: No Painful intercourse?: No Weak stream?: No Erection problems?: No Penile pain?: No  Gastrointestinal Nausea?: No Vomiting?: No Indigestion/heartburn?: No Diarrhea?: No Constipation?: No  Constitutional Fever: No Night sweats?: No Weight loss?: No Fatigue?: No  Skin Skin rash/lesions?: No Itching?: No  Eyes Blurred vision?: No Double vision?: No  Ears/Nose/Throat Sore throat?: No Sinus problems?: No  Hematologic/Lymphatic Swollen glands?: No Easy bruising?: No  Cardiovascular Leg swelling?: No Chest pain?: No  Respiratory Cough?: No Shortness of breath?: No  Endocrine Excessive thirst?: No  Musculoskeletal Back pain?: No Joint pain?: Yes  Neurological Headaches?: No Dizziness?: No  Psychologic Depression?: No Anxiety?: No  Physical Exam: BP 114/78   Pulse 88   Ht 5\' 11"  (1.803 m)   Wt (!) 339 lb (153.8 kg)   BMI 47.28 kg/m   Constitutional:  Alert and oriented, No acute distress. HEENT: [place] AT, moist mucus membranes.  Trachea midline, no masses. Cardiovascular: + bilateral LE edema. Respiratory: Normal respiratory effort, no increased work of breathing. GI: Abdomen is soft, nontender, nondistended, no abdominal masses.  Morbility obese.   GU: No CVA tenderness Skin: No rashes, bruises or suspicious lesions. Neurologic: Grossly intact, no focal deficits, moving all 4 extremities. Psychiatric: Normal mood and affect.  Laboratory Data: Lab Results  Component Value Date   WBC 6.2  06/22/2017   HGB 16.5 06/22/2017   HCT 48.8 06/22/2017   MCV 100.5 (H) 06/22/2017   PLT 188.0 06/22/2017    Lab Results  Component Value Date   CREATININE 0.90 06/22/2017    Lab Results  Component Value Date   HGBA1C 5.7 06/22/2017    Urinalysis Results for orders placed or performed in visit on 06/20/17  Microscopic Examination  Result Value Ref Range   WBC, UA 0-5 0 - 5 /hpf   RBC, UA None seen 0 - 2 /hpf   Epithelial Cells (non renal) None seen 0 - 10 /hpf   Bacteria, UA  None seen None seen/Few  Urinalysis, Complete  Result Value Ref Range   Specific Gravity, UA 1.025 1.005 - 1.030   pH, UA 5.5 5.0 - 7.5   Color, UA Yellow Yellow   Appearance Ur Clear Clear   Leukocytes, UA Trace (A) Negative   Protein, UA Negative Negative/Trace   Glucose, UA Negative Negative   Ketones, UA Negative Negative   RBC, UA Negative Negative   Bilirubin, UA Negative Negative   Urobilinogen, Ur 0.2 0.2 - 1.0 mg/dL   Nitrite, UA Negative Negative   Microscopic Examination See below:     Pertinent Imaging: KUB ordered, pending  Assessment & Plan:    1. Nephrolithiasis We discussed general stone prevention techniques including drinking plenty water with goal of producing 2.5 L urine daily, increased citric acid intake, avoidance of high oxalate containing foods, and decreased salt intake.  Information about dietary recommendations given today.   Given recurrent stones and risk for developing more stones in the setting of upcoming bariatric surgery, strongly recommend 24 hour metabolic work up.    Records requested.    KUB prior to next visit.   - Urinalysis, Complete   Return for f/u KUB, litholink, (sign for records release for [facility]).  Hollice Espy, MD  [facility] [address], [place], [place] [address] [phone]

## 2017-06-22 ENCOUNTER — Other Ambulatory Visit (INDEPENDENT_AMBULATORY_CARE_PROVIDER_SITE_OTHER): Payer: Medicare HMO

## 2017-06-22 DIAGNOSIS — I482 Chronic atrial fibrillation: Secondary | ICD-10-CM | POA: Diagnosis not present

## 2017-06-22 LAB — COMPREHENSIVE METABOLIC PANEL
ALT: 19 U/L (ref 0–53)
AST: 17 U/L (ref 0–37)
Albumin: 4.2 g/dL (ref 3.5–5.2)
Alkaline Phosphatase: 59 U/L (ref 39–117)
BUN: 16 mg/dL (ref 6–23)
CO2: 26 mEq/L (ref 19–32)
Calcium: 9.1 mg/dL (ref 8.4–10.5)
Chloride: 100 mEq/L (ref 96–112)
Creatinine, Ser: 0.9 mg/dL (ref 0.40–1.50)
GFR: 91.35 mL/min (ref >60.00)
Glucose, Bld: 128 mg/dL — ABNORMAL HIGH (ref 70–99)
Potassium: 4.6 mEq/L (ref 3.5–5.1)
Sodium: 136 mEq/L (ref 135–145)
Total Bilirubin: 0.7 mg/dL (ref 0.2–1.2)
Total Protein: 6.5 g/dL (ref 6.0–8.3)

## 2017-06-22 LAB — CBC WITH DIFFERENTIAL/PLATELET
Basophils Absolute: 0.1 10*3/uL (ref 0.0–0.1)
Basophils Relative: 0.9 % (ref 0.0–3.0)
Eosinophils Absolute: 0.2 10*3/uL (ref 0.0–0.7)
Eosinophils Relative: 2.5 % (ref 0.0–5.0)
HCT: 48.8 % (ref 39.0–52.0)
Hemoglobin: 16.5 g/dL (ref 13.0–17.0)
Lymphocytes Relative: 32.6 % (ref 12.0–46.0)
Lymphs Abs: 2 10*3/uL (ref 0.7–4.0)
MCHC: 33.8 g/dL (ref 30.0–36.0)
MCV: 100.5 fl — ABNORMAL HIGH (ref 78.0–100.0)
Monocytes Absolute: 0.5 10*3/uL (ref 0.1–1.0)
Monocytes Relative: 7.9 % (ref 3.0–12.0)
Neutro Abs: 3.5 10*3/uL (ref 1.4–7.7)
Neutrophils Relative %: 56.1 % (ref 43.0–77.0)
Platelets: 188 10*3/uL (ref 150.0–400.0)
RBC: 4.85 Mil/uL (ref 4.22–5.81)
RDW: 13.3 % (ref 11.5–15.5)
WBC: 6.2 10*3/uL (ref 4.0–10.5)

## 2017-06-22 LAB — LIPID PANEL
Cholesterol: 119 mg/dL (ref 0–200)
HDL: 33.5 mg/dL — ABNORMAL LOW (ref >39.00)
NonHDL: 85.95
Total CHOL/HDL Ratio: 4
Triglycerides: 210 mg/dL — ABNORMAL HIGH (ref 0.0–149.0)
VLDL: 42 mg/dL — ABNORMAL HIGH (ref 0.0–40.0)

## 2017-06-22 LAB — VITAMIN B12: Vitamin B-12: 535 pg/mL (ref 211–911)

## 2017-06-22 LAB — LDL CHOLESTEROL, DIRECT: Direct LDL: 76 mg/dL

## 2017-06-22 LAB — IRON,TIBC AND FERRITIN PANEL
%SAT: 32 % (calc) (ref 15–60)
Ferritin: 955 ng/mL — ABNORMAL HIGH (ref 20–380)
Iron: 93 ug/dL (ref 50–180)
TIBC: 293 mcg/dL (calc) (ref 250–425)

## 2017-06-22 LAB — VITAMIN D 25 HYDROXY (VIT D DEFICIENCY, FRACTURES): VITD: 26.34 ng/mL — ABNORMAL LOW (ref 30.00–100.00)

## 2017-06-22 LAB — TSH: TSH: 1.79 u[IU]/mL (ref 0.35–4.50)

## 2017-06-22 LAB — COLOGUARD: Cologuard: NEGATIVE

## 2017-06-22 LAB — HEMOGLOBIN A1C: Hgb A1c MFr Bld: 5.7 % (ref 4.6–6.5)

## 2017-06-23 LAB — H. PYLORI BREATH TEST: H. pylori Breath Test: NOT DETECTED

## 2017-06-26 ENCOUNTER — Encounter: Payer: Self-pay | Admitting: Urology

## 2017-06-26 DIAGNOSIS — G4733 Obstructive sleep apnea (adult) (pediatric): Secondary | ICD-10-CM | POA: Diagnosis not present

## 2017-06-28 ENCOUNTER — Encounter: Payer: Self-pay | Admitting: Family Medicine

## 2017-06-29 ENCOUNTER — Other Ambulatory Visit: Payer: Medicare HMO

## 2017-06-29 DIAGNOSIS — N200 Calculus of kidney: Secondary | ICD-10-CM | POA: Diagnosis not present

## 2017-06-30 LAB — VITAMIN B1: Vitamin B1 (Thiamine): 15 nmol/L (ref 8–30)

## 2017-07-01 ENCOUNTER — Other Ambulatory Visit: Payer: Self-pay | Admitting: Family Medicine

## 2017-07-01 DIAGNOSIS — R7989 Other specified abnormal findings of blood chemistry: Secondary | ICD-10-CM

## 2017-07-01 DIAGNOSIS — D7589 Other specified diseases of blood and blood-forming organs: Secondary | ICD-10-CM

## 2017-07-03 ENCOUNTER — Encounter: Payer: Self-pay | Admitting: Family Medicine

## 2017-07-05 ENCOUNTER — Other Ambulatory Visit (INDEPENDENT_AMBULATORY_CARE_PROVIDER_SITE_OTHER): Payer: Medicare HMO

## 2017-07-05 ENCOUNTER — Other Ambulatory Visit: Payer: Self-pay | Admitting: Urology

## 2017-07-05 DIAGNOSIS — R7989 Other specified abnormal findings of blood chemistry: Secondary | ICD-10-CM

## 2017-07-05 DIAGNOSIS — D7589 Other specified diseases of blood and blood-forming organs: Secondary | ICD-10-CM

## 2017-07-05 LAB — CBC WITH DIFFERENTIAL/PLATELET
Basophils Absolute: 0.1 10*3/uL (ref 0.0–0.1)
Basophils Relative: 0.9 % (ref 0.0–3.0)
Eosinophils Absolute: 0.1 10*3/uL (ref 0.0–0.7)
Eosinophils Relative: 1.4 % (ref 0.0–5.0)
HCT: 48.1 % (ref 39.0–52.0)
Hemoglobin: 16.7 g/dL (ref 13.0–17.0)
Lymphocytes Relative: 32.3 % (ref 12.0–46.0)
Lymphs Abs: 2.3 10*3/uL (ref 0.7–4.0)
MCHC: 34.7 g/dL (ref 30.0–36.0)
MCV: 98.2 fl (ref 78.0–100.0)
Monocytes Absolute: 0.6 10*3/uL (ref 0.1–1.0)
Monocytes Relative: 7.9 % (ref 3.0–12.0)
Neutro Abs: 4 10*3/uL (ref 1.4–7.7)
Neutrophils Relative %: 57.5 % (ref 43.0–77.0)
Platelets: 172 10*3/uL (ref 150.0–400.0)
RBC: 4.9 Mil/uL (ref 4.22–5.81)
RDW: 12.9 % (ref 11.5–15.5)
WBC: 7 10*3/uL (ref 4.0–10.5)

## 2017-07-05 LAB — FERRITIN: Ferritin: 736.2 ng/mL — ABNORMAL HIGH (ref 22.0–322.0)

## 2017-07-07 ENCOUNTER — Encounter: Payer: Self-pay | Admitting: Urology

## 2017-07-07 ENCOUNTER — Other Ambulatory Visit: Payer: Self-pay | Admitting: Family Medicine

## 2017-07-07 ENCOUNTER — Telehealth: Payer: Self-pay

## 2017-07-07 DIAGNOSIS — N200 Calculus of kidney: Secondary | ICD-10-CM

## 2017-07-07 NOTE — Telephone Encounter (Signed)
Per Dr. Erlene Quan pt needs an appt with KUB prior to discuss litholink results. Spoke with pt and made aware. Pt voiced understanding. Appt made and orders placed.

## 2017-07-07 NOTE — Telephone Encounter (Signed)
-----   Message from Hollice Espy, MD sent at 07/06/2017  9:57 AM EST ----- This patient needs follow up with KUB and to review litholink.  I do not see a f/u scheduled.  Please arrange.    Hollice Espy, MD

## 2017-07-07 NOTE — Telephone Encounter (Signed)
Patient already had an app scheduled for 07-11-17   [facility]

## 2017-07-08 ENCOUNTER — Telehealth: Payer: Self-pay | Admitting: Family Medicine

## 2017-07-08 DIAGNOSIS — R7989 Other specified abnormal findings of blood chemistry: Secondary | ICD-10-CM

## 2017-07-08 NOTE — Telephone Encounter (Signed)
-----   Message from Carlos Huger, MD sent at 07/08/2017 12:42 PM EST ----- Regarding: RE: question about ferritin level It seems to be persistent and his hbg is near the upper limit of normal.  It's likely going to be negative (or possibly heterozygote), but worth while sending the test to absolutely sure.  -Tim   ----- Message ----- From: Carlos Haven, MD Sent: 07/08/2017  12:34 PM To: Carlos Huger, MD Subject: question about ferritin level                  Hi Tim,  I obtained labs on this patient in preparation for possible gastric bypass surgery. The lab work revealed an elevated ferritin, though his other iron studies were normal and his CBC and LFTs were in the normal range as well. I though about testing for hemochromatosis, though with his other normal lab work I wanted to see what you thought. Would it be worthwhile testing for this or could the elevated ferritin just be a nonspecific marker of inflammation or related to his morbid obesity? Thanks for your help.  Carlos Crosby   ----- Message ----- From: Juanda Chance, CMA Sent: 07/06/2017  10:47 AM To: Carlos Haven, MD  Patient notified and scheduled for lab please place orders. Faxed labs to [facility]

## 2017-07-08 NOTE — Telephone Encounter (Signed)
Please let the patient know that I heard back from the hematologist. He recommended proceeding with the testing for hemochromatosis based on his elevated ferritin. I have placed the order. Thanks.

## 2017-07-10 ENCOUNTER — Ambulatory Visit
Admission: RE | Admit: 2017-07-10 | Discharge: 2017-07-10 | Disposition: A | Discharge status: Home / Self Care | Payer: Medicare HMO | Source: Ambulatory Visit | Attending: Urology | Admitting: Urology

## 2017-07-10 ENCOUNTER — Encounter: Payer: Medicare HMO | Attending: Surgery | Admitting: Dietician

## 2017-07-10 ENCOUNTER — Encounter: Payer: Self-pay | Admitting: Family Medicine

## 2017-07-10 ENCOUNTER — Encounter: Payer: Self-pay | Admitting: Dietician

## 2017-07-10 VITALS — Ht 71.0 in | Wt 337.2 lb

## 2017-07-10 DIAGNOSIS — Z6841 Body Mass Index (BMI) 40.0 and over, adult: Secondary | ICD-10-CM

## 2017-07-10 DIAGNOSIS — Z713 Dietary counseling and surveillance: Secondary | ICD-10-CM | POA: Diagnosis not present

## 2017-07-10 DIAGNOSIS — N200 Calculus of kidney: Secondary | ICD-10-CM

## 2017-07-10 NOTE — Progress Notes (Signed)
Appt start time: 1030 end time:  1130.  Assessment:   #2 of 6 SWL Appointment.   Start Wt at NDES: 342.9lbs Wt: 337.2lbs BMI: 47.03  Preferred Learning Style:   Visual  Hands on   Learning Readiness:   Change in progress  MEDICATIONS: acetaminophen, apixaban, cholecalciferol, digoxin, furosemide, lisinopril, metoprolol succinate, Centrum silver multivitamin, Potassium, pravastatin, traMADol, verapamil  Progress: Carlos Crosby has made significant diet changes; he has decreased portions of starches and meats, has decrease sugar and fat, has included vegetables and fruits (foods to which he had aversions). He has maintained regular physical activity. He has joined a bariatric support group. He is making plans for difficult situations that might arise when following the bariatric diet, such as anniversaries, or other special occasions.    DIETARY INTAKE: 3 meals and 1 snack daily  24-hr recall:  B ( AM): oatmeal (cooked) 4x a week, orange, maybe 1-2 slices bacon; eggs with cheese, rarely small portion of potatoes  Snk ( AM): small portions nuts or pumpkin or sunflower seeds  L ( PM): nuts or seeds and orange if not at breakfast (cara cara); soup light Progresso Snk ( PM): none or same as am snack or 2 tsp peanut butter D ( PM): small portion starch, small-moderate portion meat/ protein and vegetable -- limiting corn and peas Snk ( PM): none; rarely popcorn Beverages: water, sparkling water (low carbonation)  Usual physical activity: swimming laps 1x a week/ water aerobics 2x a week 60 minutes  Diet to Follow: 1500 calories Continuation of current eating pattern and balance              Nutritional Diagnosis:  [place]-3.3 Overweight/obesity related to history of excess calories and inactivity, as evidenced by BMI 47, patient report.              Intervention:  Nutrition counseling for upcoming Bariatric Surgery.   Patient has made significant diet changes, including adding vegetables  and fruits to overcome his food aversions.    Commended patient for progress made. He is becoming well prepared for success after bariatric surgery.   Goal for this visit is to continue with current healthy habits.   Teaching Method Utilized:  Visual Auditory   Handouts given during visit include:  Guidelines for gout  Goals and Instructions  Barriers to learning/adherence to lifestyle change: none  Demonstrated degree of understanding via:  Teach Back   Monitoring/Evaluation:  Dietary intake, exercise, and body weight 08/07/17.

## 2017-07-10 NOTE — Telephone Encounter (Signed)
Patient is scheduled for labs 

## 2017-07-10 NOTE — Patient Instructions (Signed)
   Continue with current healthy eating habits, great job!  Keep up regular water exercise.

## 2017-07-11 ENCOUNTER — Encounter: Payer: Self-pay | Admitting: Urology

## 2017-07-11 ENCOUNTER — Other Ambulatory Visit (INDEPENDENT_AMBULATORY_CARE_PROVIDER_SITE_OTHER): Payer: Medicare HMO

## 2017-07-11 ENCOUNTER — Ambulatory Visit (INDEPENDENT_AMBULATORY_CARE_PROVIDER_SITE_OTHER): Payer: Medicare HMO | Admitting: Urology

## 2017-07-11 VITALS — BP 111/67 | HR 97 | Ht 71.0 in | Wt 337.0 lb

## 2017-07-11 DIAGNOSIS — R7989 Other specified abnormal findings of blood chemistry: Secondary | ICD-10-CM | POA: Diagnosis not present

## 2017-07-11 DIAGNOSIS — N200 Calculus of kidney: Secondary | ICD-10-CM

## 2017-07-11 LAB — URIC ACID: Uric Acid, Serum: 7.8 mg/dL (ref 4.0–8.0)

## 2017-07-11 NOTE — Progress Notes (Signed)
07/11/2017 8:20 PM   Carlos Crosby [birth year] [id number]  Referring provider: Leone Haven, MD [address] [place], [place] [address]  Chief Complaint  Patient presents with  . Nephrolithiasis    litho link    HPI: 62 year old male who returns today to follow-up for recurrent stone disease.  Since her last visit, he had a KUB as well as a 80-DXIP urine metabolic workup.  24-hour urine shows excellent urine output, 3.14 L/day.  His urinary oxalate level is low, 65 mg/day.  Urine pH is also very low 5.137.  Urinary calcium 214 mg/day.  Urinary citrate 650 mg/day.  Please see scanned report for further details.  Previous stone analysis from 2016 showed 79% calcium oxalate monohydrate, 10% calcium oxalate dihydrate, 15% calcium phosphate.  Previous records from Dr. Letta Kocher office were received and reviewed today.  He is on multiple stone procedures in the past including PCNL, ureteroscopy and shockwave lithotripsy.  He denies any current flank pain.  He does have a personal history of gout.  He does report that his uric acid level was markedly elevated in the past.  He is allergic to allopurinol.  KUB shows fairly significant left lower pole stone burden.  PMH: Past Medical History:  Diagnosis Date  . Chronic a-fib ([place])   . Chronic systolic CHF (congestive heart failure) ([place])   . Depression   . History of kidney stones   . History of pulmonary embolism   . Hyperlipidemia   . Hypertension   . Migraine   . Obesity   . OSA (obstructive sleep apnea)    on CPAP  . Osteoarthritis    bilateral knees  . [place] spotted fever 2011    Surgical History: Past Surgical History:  Procedure Laterality Date  . CARDIAC CATHETERIZATION  2012   [facility]  . KNEE SURGERY     right knee   . LITHOTRIPSY  07/31/2014  . URETERAL STENT PLACEMENT  2016  . URETEROSCOPY      Home Medications:  Allergies as of 07/11/2017      Reactions   Allopurinol Hives, Swelling     Diltiazem Itching, Rash   Other Hives   Penicillin G Nausea Only   And dirrhea   Penicillins Other (See Comments)   Pt unsure of rxn; showed up on allergy test   Ondansetron Rash      Medication List        Accurate as of 07/11/17  8:20 PM. Always use your most recent med list.          acetaminophen 500 MG tablet Commonly known as:  TYLENOL Take 1,000 mg by mouth every 6 (six) hours as needed.   apixaban 5 MG Tabs tablet Commonly known as:  ELIQUIS Take 1 tablet (5 mg total) by mouth 2 (two) times daily.   CENTRUM SILVER ADULT 50+ Tabs Take 1 tablet by mouth daily.   digoxin 0.25 MG tablet Commonly known as:  LANOXIN Take one tablet (0.25 mg) by mouth once daily six days a week   furosemide 40 MG tablet Commonly known as:  LASIX Take 40 mg by mouth daily as needed.   lisinopril 2.5 MG tablet Commonly known as:  PRINIVIL,ZESTRIL TAKE 1 TABLET EVERY DAY   metoprolol succinate 100 MG 24 hr tablet Commonly known as:  TOPROL-XL TAKE 2 TABLETS EVERY MORNING  AND TAKE 1 TABLET EVERY EVENING   Potassium 95 MG Tabs Take 1 tablet by mouth.   pravastatin 80  MG tablet Commonly known as:  PRAVACHOL Take 1 tablet (80 mg total) by mouth every evening.   traMADol 50 MG tablet Commonly known as:  ULTRAM Take 1 tablet (50 mg total) by mouth every 6 (six) hours as needed.   verapamil 120 MG CR tablet Commonly known as:  CALAN-SR Take 1 tablet (120 mg total) by mouth daily.   Vitamin D 2000 units Caps       Allergies:  Allergies  Allergen Reactions  . Allopurinol Hives and Swelling  . Diltiazem Itching and Rash  . Other Hives  . Penicillin G Nausea Only    And dirrhea  . Penicillins Other (See Comments)    Pt unsure of rxn; showed up on allergy test  . Ondansetron Rash    Family History: Family History  Problem Relation Age of Onset  . Cancer Father        lung  . Arthritis Father   . Hypertension Father   . Cancer Paternal Grandfather        Throat  cancer    Social History:  reports that  has never smoked. he has never used smokeless tobacco. He reports that he does not drink alcohol or use drugs.  ROS: UROLOGY Frequent Urination?: No Hard to postpone urination?: No Burning/pain with urination?: No Get up at night to urinate?: No Leakage of urine?: No Urine stream starts and stops?: No Trouble starting stream?: No Do you have to strain to urinate?: No Blood in urine?: No Urinary tract infection?: No Sexually transmitted disease?: No Injury to kidneys or bladder?: No Painful intercourse?: No Weak stream?: No Erection problems?: No Penile pain?: No  Gastrointestinal Nausea?: No Vomiting?: No Indigestion/heartburn?: No Diarrhea?: No Constipation?: No  Constitutional Fever: No Night sweats?: No Weight loss?: No Fatigue?: No  Skin Skin rash/lesions?: No Itching?: No  Eyes Blurred vision?: No Double vision?: No  Ears/Nose/Throat Sore throat?: No Sinus problems?: No  Hematologic/Lymphatic Swollen glands?: No Easy bruising?: No  Cardiovascular Leg swelling?: No Chest pain?: No  Respiratory Cough?: No Shortness of breath?: No  Endocrine Excessive thirst?: No  Musculoskeletal Back pain?: No Joint pain?: Yes  Neurological Headaches?: No Dizziness?: No  Psychologic Depression?: No Anxiety?: No  Physical Exam: BP 111/67   Pulse 97   Ht 5\' 11"  (1.803 m)   Wt (!) 337 lb (152.9 kg)   BMI 47.00 kg/m   Constitutional:  Alert and oriented, No acute distress. HEENT: [place] AT, moist mucus membranes.  Trachea midline, no masses. Respiratory: Normal respiratory effort, no increased work of breathing. GI: Abdomen is soft, nontender, nondistended, no abdominal masses.  Morbidly obese. Skin: No rashes, bruises or suspicious lesions. Neurologic: Grossly intact, no focal deficits, moving all 4 extremities. Psychiatric: Normal mood and affect.  Laboratory Data: Lab Results  Component Value Date    WBC 7.0 07/05/2017   HGB 16.7 07/05/2017   HCT 48.1 07/05/2017   MCV 98.2 07/05/2017   PLT 172.0 07/05/2017    Lab Results  Component Value Date   CREATININE 0.90 06/22/2017    Lab Results  Component Value Date   HGBA1C 5.7 06/22/2017    Urinalysis Lab Results  Component Value Date   SPECGRAV 1.025 06/20/2017   PHUR 5.5 06/20/2017   COLORU Yellow 06/20/2017   APPEARANCEUR Clear 06/20/2017   LEUKOCYTESUR Trace (A) 06/20/2017   PROTEINUR Negative 06/20/2017   GLUCOSEU Negative 06/20/2017   KETONESU Negative 06/20/2017   RBCU Negative 06/20/2017   BILIRUBINUR Negative 06/20/2017   UUROB  0.2 06/20/2017   NITRITE Negative 06/20/2017    Lab Results  Component Value Date   LABMICR See below: 06/20/2017   WBCUA 0-5 06/20/2017   RBCUA None seen 06/20/2017   LABEPIT None seen 06/20/2017   BACTERIA None seen 06/20/2017    Pertinent Imaging: Results for orders placed during the hospital encounter of 07/10/17  Abdomen 1 view (KUB)   Narrative CLINICAL DATA:  Assess kidney stone burden. Pre operative evaluation prior to weight loss surgery.  EXAM: ABDOMEN - 1 VIEW  COMPARISON:  KUB of October 02, 2014  FINDINGS: There remain coarse calcifications projecting over the lower pole of the left kidney. The largest of these measures just under 8 mm in diameter. No definite right-sided kidney stones are observed. No ureteral stones are demonstrated. There are phleboliths within the pelvis. There are degenerative changes of the lumbar spine. The bowel gas pattern is unremarkable.  IMPRESSION: Multiple lower pole kidney stones on the left. No ureteral or bladder stones are observed.   Electronically Signed   By: David  Martinique M.D.   On: 07/10/2017 12:15    KUB reviewed personally today and with the patient.  Assessment & Plan:    1. Kidney stone on left side KUB reviewed today This indicates a fairly significant left lower pole stone burden with approximately 1.2  cm with a stone, which is really a conglomerate of several sizable stones  Given his overall stone burden, would highly recommend intervention for the stones as they will likely continue to grow make intervention more difficult in the future He has had difficulty with anesthesia in the past including complications from his A. fib and may consider waiting until after his gastric sleeve surgery which is reasonable I do not believe he is a good candidate for shockwave lithotripsy given his very hard stone composition and habitus I would recommend ureteroscopy as primary modality of intervention should he elect to proceed with surgery down the road We will follow-up with KUB in 3 months to assess for stability - Uric acid; Future  2. Recurrent nephrolithiasis [id number] urine metabolic workup reviewed Primary identifiable pathology includes very low urinary pH as well as high urinary oxalate Reviewed low oxalate diet-working with nutritionist currently Low urinary pH increases risk for uric acid stones however his previous stone composition was primary calcium oxalate Question whether uric acid may be the nidus for his stones Uric acid level today We will hold off on any oral medical therapy at this point in time   Return in about 3 months (around 10/08/2017) for KUB.  Hollice Espy, MD  [facility] [address] [place], [place] [address] [phone]

## 2017-07-11 NOTE — Addendum Note (Signed)
Addended by: Arby Barrette on: 07/11/2017 10:23 AM   Modules accepted: Orders

## 2017-07-17 ENCOUNTER — Encounter: Payer: Self-pay | Admitting: Urology

## 2017-07-17 ENCOUNTER — Telehealth: Payer: Self-pay

## 2017-07-17 ENCOUNTER — Telehealth: Payer: Self-pay | Admitting: Family Medicine

## 2017-07-17 LAB — HEMOCHROMATOSIS DNA-PCR(C282Y,H63D)

## 2017-07-17 NOTE — Telephone Encounter (Signed)
-----   Message from Hollice Espy, MD sent at 07/12/2017  8:44 AM EST ----- Your uric acid levels were fairly reasonable.  They still fall within the normal range and not markedly elevated.  This is reassuring.  I would focus on decreasing her oxalate intake, you can work with your nutritionist on this.  Hollice Espy, MD

## 2017-07-17 NOTE — Progress Notes (Signed)
Patient has had this lab done

## 2017-07-17 NOTE — Telephone Encounter (Signed)
Patient notified

## 2017-07-17 NOTE — Telephone Encounter (Signed)
In red folder. 

## 2017-07-17 NOTE — Telephone Encounter (Signed)
Pt dropped off A [employer]  Form to be filled out.. Placed in Dr. Tharon Aquas folder upfront. Please fax to [phone]

## 2017-07-18 ENCOUNTER — Other Ambulatory Visit: Payer: Self-pay | Admitting: Family Medicine

## 2017-07-18 DIAGNOSIS — Z148 Genetic carrier of other disease: Secondary | ICD-10-CM

## 2017-07-18 NOTE — Telephone Encounter (Signed)
Completed.    Thanks

## 2017-07-19 NOTE — Telephone Encounter (Signed)
Patient notified it has been faxed, also placed a copy at fornt desk for patient to pick up.

## 2017-07-20 ENCOUNTER — Encounter: Payer: Self-pay | Admitting: Family Medicine

## 2017-07-21 ENCOUNTER — Ambulatory Visit (INDEPENDENT_AMBULATORY_CARE_PROVIDER_SITE_OTHER): Payer: Medicare HMO

## 2017-07-21 VITALS — BP 118/68 | HR 98 | Temp 97.9°F | Resp 16 | Ht 70.5 in | Wt 343.4 lb

## 2017-07-21 DIAGNOSIS — Z0000 Encounter for general adult medical examination without abnormal findings: Secondary | ICD-10-CM

## 2017-07-21 NOTE — Patient Instructions (Addendum)
  Carlos Crosby , Thank you for taking time to come for your Medicare Wellness Visit. I appreciate your ongoing commitment to your health goals. Please review the following plan we discussed and let me know if I can assist you in the future.   Follow up with Dr. Caryl Bis as needed.    Bring a copy of your [place] and/or Living Will to be scanned into chart once completed.   Have a great day!  These are the goals we discussed: Goals    . Increase physical activity     Swim/water aerobics up to 5 days weekly, increase as tolerated       This is a list of the screening recommended for you and due dates:  Health Maintenance  Topic Date Due  . HIV Screening  01/19/1972  . Tetanus Vaccine  01/19/1976  . Stool Blood Test  03/08/2017  . Cologuard (Stool DNA test)  06/22/2020  . Flu Shot  Completed  .  Hepatitis C: One time screening is recommended by Center for Disease Control  (CDC) for  adults born from [birth year] through 1964.   Completed

## 2017-07-21 NOTE — Progress Notes (Signed)
Subjective:   Carlos Crosby is a 62 y.o. male who presents for Medicare Annual/Subsequent preventive examination.  Review of Systems:  No ROS.  Medicare Wellness Visit. Additional risk factors are reflected in the social history.  Cardiac Risk Factors include: male gender;advanced age (>37men, >38 women);obesity (BMI >30kg/m2);hypertension     Objective:    Vitals: BP 118/68 (BP Location: Left Arm, Patient Position: Sitting, Cuff Size: Normal)   Pulse 98   Temp 97.9 F (36.6 C) (Oral)   Resp 16   Ht 5' 10.5" (1.791 m)   Wt (!) 343 lb 6.4 oz (155.8 kg)   SpO2 98%   BMI 48.58 kg/m   Body mass index is 48.58 kg/m.  Advanced Directives 07/21/2017 06/08/2017 07/20/2016 04/20/2015 10/18/2014  Does Patient Have a Medical Advance Directive? No No No No No  Would patient like information on creating a medical advance directive? Yes (MAU/Ambulatory/Procedural Areas - Information given) Yes (MAU/Ambulatory/Procedural Areas - Information given) Yes (MAU/Ambulatory/Procedural Areas - Information given) - Yes - Educational materials given    Tobacco Social History   Tobacco Use  Smoking Status Never Smoker  Smokeless Tobacco Never Used     Counseling given: Not Answered   Clinical Intake:  Pre-visit preparation completed: Yes  Pain : 0-10 Pain Score: 4  Pain Type: Chronic pain Pain Location: Knee Pain Descriptors / Indicators: Discomfort Pain Frequency: Constant     Nutritional Status: BMI > 30  Obese Diabetes: No  How often do you need to have someone help you when you read instructions, pamphlets, or other written materials from your doctor or pharmacy?: 1 - Never  Interpreter Needed?: No     Past Medical History:  Diagnosis Date  . Chronic a-fib ([place])   . Chronic systolic CHF (congestive heart failure) ([place])   . Depression   . History of kidney stones   . History of pulmonary embolism   . Hyperlipidemia   . Hypertension   . Migraine   . Obesity   . OSA  (obstructive sleep apnea)    on CPAP  . Osteoarthritis    bilateral knees  . Rocky Mountain spotted fever 2011   Past Surgical History:  Procedure Laterality Date  . CARDIAC CATHETERIZATION  2012   [facility]  . KNEE SURGERY     right knee   . LITHOTRIPSY  07/31/2014  . URETERAL STENT PLACEMENT  2016  . URETEROSCOPY     Family History  Problem Relation Age of Onset  . Cancer Father        lung  . Arthritis Father   . Hypertension Father   . Cancer Paternal Grandfather        Throat cancer   Social History   Socioeconomic History  . Marital status: Married    Spouse name: None  . Number of children: None  . Years of education: None  . Highest education level: None  Social Needs  . Financial resource strain: Not very hard  . Food insecurity - worry: Never true  . Food insecurity - inability: Never true  . Transportation needs - medical: No  . Transportation needs - non-medical: No  Occupational History  . None  Tobacco Use  . Smoking status: Never Smoker  . Smokeless tobacco: Never Used  Substance and Sexual Activity  . Alcohol use: No    Frequency: Never    Comment: occasional; 3 times a year  . Drug use: No  . Sexual activity: Not Currently  Other Topics  Concern  . None  Social History Narrative  . None    Outpatient Encounter Medications as of 07/21/2017  Medication Sig  . acetaminophen (TYLENOL) 500 MG tablet Take 1,000 mg by mouth every 6 (six) hours as needed.  Marland Kitchen apixaban (ELIQUIS) 5 MG TABS tablet Take 1 tablet (5 mg total) by mouth 2 (two) times daily.  . Cholecalciferol (VITAMIN D) 2000 units CAPS   . digoxin (LANOXIN) 0.25 MG tablet Take one tablet (0.25 mg) by mouth once daily six days a week  . furosemide (LASIX) 40 MG tablet Take 40 mg by mouth daily as needed.   Marland Kitchen lisinopril (PRINIVIL,ZESTRIL) 2.5 MG tablet TAKE 1 TABLET EVERY DAY  . metoprolol succinate (TOPROL-XL) 100 MG 24 hr tablet TAKE 2 TABLETS EVERY MORNING  AND TAKE 1 TABLET EVERY EVENING  .  Multiple Vitamins-Minerals (CENTRUM SILVER ADULT 50+) TABS Take 1 tablet by mouth daily.  . Potassium 95 MG TABS Take 1 tablet by mouth.  . pravastatin (PRAVACHOL) 80 MG tablet Take 1 tablet (80 mg total) by mouth every evening.  . traMADol (ULTRAM) 50 MG tablet Take 1 tablet (50 mg total) by mouth every 6 (six) hours as needed.  . verapamil (CALAN-SR) 120 MG CR tablet Take 1 tablet (120 mg total) by mouth daily.   No facility-administered encounter medications on file as of 07/21/2017.     Activities of Daily Living In your present state of health, do you have any difficulty performing the following activities: 07/21/2017  Hearing? N  Vision? N  Difficulty concentrating or making decisions? N  Walking or climbing stairs? Y  Comment Unsteady gait  Dressing or bathing? Y  Comment wife assists when getting in and out of the tub  Doing errands, shopping? N  Preparing Food and eating ? N  Using the Toilet? N  In the past six months, have you accidently leaked urine? N  Do you have problems with loss of bowel control? N  Managing your Medications? N  Managing your Finances? N  Housekeeping or managing your Housekeeping? Y  Comment Wife assists with house cleaning  Some recent data might be hidden    Patient Care Team: Leone Haven, MD as PCP - General (Family Medicine)   Assessment:   This is a routine wellness examination for Carlos Crosby.  The goal of the wellness visit is to assist the patient how to close the gaps in care and create a preventative care plan for the patient.   The roster of all physicians providing medical care to patient is listed in the Snapshot section of the chart.  Taking calcium VIT D as appropriate/Osteoporosis risk reviewed.    Safety issues reviewed; Smoke and carbon monoxide detectors in the home. No firearms or firearms locked in a safe within the home. Wears seatbelts when driving or riding with others. No violence in the home.  They do not have  excessive sun exposure.  Discussed the need for sun protection: hats, long sleeves and the use of sunscreen if there is significant sun exposure.  Patient is alert, normal appearance, oriented to person/place/and time.  Correctly identified the president of the [place] and recalls of 3/3 words. Performs simple calculations and can read correct time from watch face.  Displays appropriate judgement.  No new identified risk were noted.  No failures at ADL's or IADL's.  Ambulates with cane.  BMI- discussed the importance of a healthy diet, water intake and the benefits of aerobic exercise. Educational material provided.  24 hour diet recall:   Followed by Dietician.  Nutritional based diet.  Daily fluid intake: 0 cups of caffeine, 10 cups of water  Dental- dentures  Eye- Visual acuity not assessed per patient preference since they have regular follow up with the ophthalmologist.  Wears corrective lenses when driving.  Sleep patterns- Sleeps 7-8 hours at night.  Wakes feeling rested. CPAP in use.  TDAP vaccine deferred per patient preference.  Follow up with insurance.  Educational material provided.  HIV health screening discussed.  Deferred per patient request.    Colon CA Screening Annual FOBT discussed.  Cologuard complete.  Patient Concerns: None at this time. Follow up with PCP as needed.  Exercise Activities and Dietary recommendations Current Exercise Habits: Home exercise routine, Type of exercise: calisthenics(Swimming), Time (Minutes): 60, Frequency (Times/Week): 3, Weekly Exercise (Minutes/Week): 180, Intensity: Moderate  Goals    . Increase physical activity     Swim/water aerobics up to 5 days weekly, increase as tolerated       Fall Risk Fall Risk  07/21/2017 07/10/2017 06/08/2017 07/20/2016 05/16/2016  Falls in the past year? No No No No No  Risk for fall due to : - - Impaired balance/gait - -   Depression Screen PHQ 2/9 Scores 07/21/2017 06/08/2017 07/20/2016  05/16/2016  PHQ - 2 Score 0 0 0 0    Cognitive Function MMSE - Mini Mental State Exam 07/21/2017 07/20/2016  Orientation to time 5 5  Orientation to Place 5 5  Registration 3 3  Attention/ Calculation 5 5  Recall 3 2  Recall-comments - 2 out of 3 words recalled  Language- name 2 objects 2 2  Language- repeat 1 1  Language- follow 3 step command 3 3  Language- read & follow direction 1 1  Write a sentence 1 1  Copy design 1 1  Total score 30 29        Immunization History  Administered Date(s) Administered  . Influenza,inj,Quad PF,6+ Mos 01/26/2017  . Influenza-Unspecified 02/08/2016    Screening Tests Health Maintenance  Topic Date Due  . HIV Screening  01/19/1972  . TETANUS/TDAP  01/19/1976  . COLON CANCER SCREENING ANNUAL FOBT  03/08/2017  . Fecal DNA (Cologuard)  06/22/2020  . INFLUENZA VACCINE  Completed  . Hepatitis C Screening  Completed      Plan:   End of life planning; Advanced aging; Advanced directives discussed.  No HCPOA/Living Will.  Additional information provided to help them start the conversation with family.  Copy of HCPOA/Living Will requested upon completion. Time spent on this topic is 20 minutes.  I have personally reviewed and noted the following in the patient's chart:   . Medical and social history . Use of alcohol, tobacco or illicit drugs  . Current medications and supplements . Functional ability and status . Nutritional status . Physical activity . Advanced directives . List of other physicians . Hospitalizations, surgeries, and ER visits in previous 12 months . Vitals . Screenings to include cognitive, depression, and falls . Referrals and appointments  In addition, I have reviewed and discussed with patient certain preventive protocols, quality metrics, and best practice recommendations. A written personalized care plan for preventive services as well as general preventive health recommendations were provided to patient.      Varney Biles, LPN  07/26/2540

## 2017-07-23 NOTE — Progress Notes (Signed)
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  INTERVAL HISTORY: Patient is a 62 year old male who was noted to have an increase hemoglobin on routine blood work.  Follow-up laboratory work included iron stores which noted a significantly elevated ferritin of over 900.  Repeat laboratory work confirmed the results.  Subsequent testing confirmed hemochromatosis.  He currently feels well and is asymptomatic.  He has no neurologic complaints.  He denies any recent fevers or illnesses.  He has a good appetite and denies weight loss.  He has no chest pain or shortness of breath.  He denies any nausea, vomiting, constipation, or diarrhea.  He has no urinary complaints.  Patient feels at his baseline offers no specific complaints today.  REVIEW OF SYSTEMS:   Review of Systems  Constitutional: Negative.  Negative for fever, malaise/fatigue and weight loss.  Respiratory: Negative.  Negative for cough, hemoptysis and shortness of breath.   Cardiovascular: Negative.  Negative for chest pain and leg swelling.  Gastrointestinal: Negative.  Negative for abdominal pain, blood in stool and melena.  Genitourinary: Negative.  Negative for hematuria.  Musculoskeletal: Negative.   Skin: Negative.  Negative for rash.  Neurological: Negative.  Negative for weakness.  Psychiatric/Behavioral: Negative.  The patient is not nervous/anxious.     As per HPI. Otherwise, a complete review of systems is negative.  PAST MEDICAL HISTORY: Past Medical History:  Diagnosis Date  . Chronic a-fib ([place])   . Chronic systolic CHF (congestive heart failure) ([place])   . Depression   . History of kidney stones   . History of pulmonary embolism   . Hyperlipidemia   .  Hypertension   . Migraine   . Obesity   . OSA (obstructive sleep apnea)    on CPAP  . Osteoarthritis    bilateral knees  . Rocky Mountain spotted fever 2011    PAST SURGICAL HISTORY: Past Surgical History:  Procedure Laterality Date  . CARDIAC CATHETERIZATION  2012   [facility]  . KNEE SURGERY     right knee   . LITHOTRIPSY  07/31/2014  . URETERAL STENT PLACEMENT  2016  . URETEROSCOPY      FAMILY HISTORY: Family History  Problem Relation Age of Onset  . Thyroid disease Mother   . Cancer Father        lung  . Arthritis Father   . Hypertension Father   . Cancer Paternal Grandfather        Throat cancer    ADVANCED DIRECTIVES (Y/N):  N  HEALTH MAINTENANCE: Social History   Tobacco Use  . Smoking status: Never Smoker  . Smokeless tobacco: Never Used  Substance Use Topics  . Alcohol use: No    Frequency: Never    Comment: occasional; 3 times a year  . Drug use: No     Colonoscopy:  PAP:  Bone density:  Lipid panel:  Allergies  Allergen Reactions  . Allopurinol Hives and Swelling  . Diltiazem Itching and Rash  . Other Hives  . Penicillin G Nausea Only    And dirrhea  . Penicillins Other (See Comments)    Pt unsure of rxn; showed up on allergy test  . Ondansetron Rash    Current Outpatient Medications  Medication Sig Dispense Refill  . apixaban (ELIQUIS) 5  MG TABS tablet Take 1 tablet (5 mg total) by mouth 2 (two) times daily. 180 tablet 3  . Cholecalciferol (VITAMIN D) 2000 units CAPS     . digoxin (LANOXIN) 0.25 MG tablet Take one tablet (0.25 mg) by mouth once daily six days a week 30 tablet 1  . lisinopril (PRINIVIL,ZESTRIL) 2.5 MG tablet TAKE 1 TABLET EVERY DAY 90 tablet 3  . metoprolol succinate (TOPROL-XL) 100 MG 24 hr tablet TAKE 2 TABLETS EVERY MORNING  AND TAKE 1 TABLET EVERY EVENING 270 tablet 3  . Multiple Vitamins-Minerals (CENTRUM SILVER ADULT 50+) TABS Take 1 tablet by mouth daily.    . Potassium 95 MG TABS Take 1 tablet by mouth.    .  pravastatin (PRAVACHOL) 80 MG tablet Take 1 tablet (80 mg total) by mouth every evening. 90 tablet 3  . verapamil (CALAN-SR) 120 MG CR tablet Take 1 tablet (120 mg total) by mouth daily. 90 tablet 3  . acetaminophen (TYLENOL) 500 MG tablet Take 1,000 mg by mouth every 6 (six) hours as needed.    . furosemide (LASIX) 40 MG tablet Take 40 mg by mouth daily as needed.     . traMADol (ULTRAM) 50 MG tablet Take 1 tablet (50 mg total) by mouth every 6 (six) hours as needed. (Patient not taking: Reported on 07/25/2017) 90 tablet 1   No current facility-administered medications for this visit.     OBJECTIVE: Vitals:   07/25/17 1141  BP: (!) 147/85  Pulse: 63  Resp: 20  Temp: (!) 96.3 F (35.7 C)     Body mass index is 48.64 kg/m.    ECOG FS:0 - Asymptomatic  General: Well-developed, well-nourished, no acute distress. Eyes: Pink conjunctiva, anicteric sclera. HEENT: Normocephalic, moist mucous membranes, clear oropharnyx. Lungs: Clear to auscultation bilaterally. Heart: Regular rate and rhythm. No rubs, murmurs, or gallops. Abdomen: Soft, nontender, nondistended. No organomegaly noted, normoactive bowel sounds. Musculoskeletal: No edema, cyanosis, or clubbing. Neuro: Alert, answering all questions appropriately. Cranial nerves grossly intact. Skin: No rashes or petechiae noted. Psych: Normal affect. Lymphatics: No cervical, calvicular, axillary or inguinal LAD.   LAB RESULTS:  Lab Results  Component Value Date   NA 136 06/22/2017   K 4.6 06/22/2017   CL 100 06/22/2017   CO2 26 06/22/2017   GLUCOSE 128 (H) 06/22/2017   BUN 16 06/22/2017   CREATININE 0.90 06/22/2017   CALCIUM 9.1 06/22/2017   PROT 6.5 06/22/2017   ALBUMIN 4.2 06/22/2017   AST 17 06/22/2017   ALT 19 06/22/2017   ALKPHOS 59 06/22/2017   BILITOT 0.7 06/22/2017   GFRNONAA >60 03/30/2017   GFRAA >60 03/30/2017    Lab Results  Component Value Date   WBC 7.0 07/05/2017   NEUTROABS 4.0 07/05/2017   HGB 16.7  07/05/2017   HCT 48.1 07/05/2017   MCV 98.2 07/05/2017   PLT 172.0 07/05/2017   Lab Results  Component Value Date   IRON 93 06/22/2017   TIBC 293 06/22/2017   IRONPCTSAT 32 06/22/2017   Lab Results  Component Value Date   FERRITIN 736.2 (H) 07/05/2017     STUDIES: Abdomen 1 View (kub)  Result Date: 07/10/2017 CLINICAL DATA:  Assess kidney stone burden. Pre operative evaluation prior to weight loss surgery. EXAM: ABDOMEN - 1 VIEW COMPARISON:  KUB of October 02, 2014 FINDINGS: There remain coarse calcifications projecting over the lower pole of the left kidney. The largest of these measures just under 8 mm in diameter. No definite right-sided kidney stones  are observed. No ureteral stones are demonstrated. There are phleboliths within the pelvis. There are degenerative changes of the lumbar spine. The bowel gas pattern is unremarkable. IMPRESSION: Multiple lower pole kidney stones on the left. No ureteral or bladder stones are observed. Electronically Signed   By: David  Martinique M.D.   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  Goal ferritin will be between 50 and 100.  Return to clinic tomorrow to receive 400 mL of phlebotomy with [facility].  Patient will then return to clinic in 6 weeks with repeat laboratory work, further evaluation, and consideration of additional phlebotomy.  Approximately 45 minutes was spent in discussion of which greater than 50% was consultation.  Patient expressed understanding and was in agreement with this plan. He also understands that He can call clinic at any time with any questions, concerns, or complaints.    Lloyd Huger, MD   07/25/2017 1:15 PM

## 2017-07-25 ENCOUNTER — Encounter: Payer: Self-pay | Admitting: Family Medicine

## 2017-07-25 ENCOUNTER — Other Ambulatory Visit: Payer: Self-pay

## 2017-07-25 ENCOUNTER — Encounter: Payer: Self-pay | Admitting: Oncology

## 2017-07-25 ENCOUNTER — Inpatient Hospital Stay: Payer: Medicare HMO | Attending: Oncology | Admitting: Oncology

## 2017-07-25 NOTE — Progress Notes (Signed)
Here for new pt evaluation.  

## 2017-07-26 ENCOUNTER — Inpatient Hospital Stay: Payer: Medicare HMO

## 2017-08-07 ENCOUNTER — Encounter: Payer: Self-pay | Admitting: Dietician

## 2017-08-07 ENCOUNTER — Encounter: Payer: Medicare HMO | Attending: Surgery | Admitting: Dietician

## 2017-08-07 VITALS — Ht 71.0 in | Wt 336.7 lb

## 2017-08-07 DIAGNOSIS — Z6841 Body Mass Index (BMI) 40.0 and over, adult: Secondary | ICD-10-CM

## 2017-08-07 DIAGNOSIS — Z713 Dietary counseling and surveillance: Secondary | ICD-10-CM | POA: Insufficient documentation

## 2017-08-07 NOTE — Patient Instructions (Addendum)
   Continue to try more variety of fruits and/or vegetables.   Keep up your healthy food choices.  Great job increasing pool exercise and general daily activity! Keep up the great work!

## 2017-08-07 NOTE — Progress Notes (Signed)
Appt start time: 1030 end time:  1115.  Assessment:   #2 SWL Appointment.   Start Wt at NDES: 337.2lbs Wt: 336.7lbs BMI: 46.96  Medical changes: Patient reports several small oxalate kidney stones; also has been diagnosed with hemachromatosis  Preferred Learning Style:   Visual  Hands-on  Learning Readiness:   Change in progress  MEDICATIONS: acetaminophen, apixaban, cholecalciferol, digoxin, furosemide, lisinopril, metoprolol succinate, Centrum silver multivitamin, potassium, pravastatin, traMADol, verapamil  Progress: Carlos Crosby continues to make diet and lifestyle improvements by ongoing effort to increase vegetable and fruit intake, decreasing overall food portions, limiting snacks. He has also increased his regular physical activity, both pool exercise and general daily activity. He continues to participate in a bariatric support group. Weight has remained stable, but patient reports his clothing is fitting more loosely.   DIETARY INTAKE:  24-hr recall:  B ( AM): often oatmeal; today 2 mini oranges; sometimes eggs or 1-2 slices bacon; pork roll and pancakes Snk ( AM): none or sometimes nuts or seeds  L ( PM): soup and fruit or sandwich or hot pocket, hamburger no bun with cheese/ blue cheese; sometimes cooked meal of meat and veg and starch (rice or potato) when wife works second shift Snk ( PM): none or nuts or seeds D ( PM): either cooked meal or light meal if cooked meal at lunch -- same food choices as listed for lunch Snk ( PM): popcorn 1-2 times a week with small amount butter and salt; occasionally nuts, or no snack.  Beverages: water, sparkling water  Usual physical activity: swimming or water aerobics 4 times a week  Diet to Follow: 1500 calories Continuation of current eating pattern and balance               Nutritional Diagnosis:  [place]-3.3 Overweight/obesity related to history of excess calories and inactivity, as evidenced by BMI 46.96, patient report.           Intervention:  Nutrition counseling for upcoming Bariatric Surgery.   Commended patient for ongoing effort to make positive changes.   He plans to continue increasing vegetables and fruits, and is considering trying a protein shake for lunch meal.    Discussed high oxalate foods to limit, due to diagnosis of oxalate stones.   Teaching Method Utilized:  Visual Auditory  Handouts given during visit include:  Kidney stones nutrition therapy (NCM)  Goals and instructions  Barriers to learning/adherence to lifestyle change: none  Demonstrated degree of understanding via:  Teach Back   Monitoring/Evaluation:  Dietary intake, exercise, and body weight 09/05/17.

## 2017-08-14 ENCOUNTER — Other Ambulatory Visit: Payer: Self-pay

## 2017-08-14 MED ORDER — LISINOPRIL 2.5 MG PO TABS: 2.5000 mg | ORAL_TABLET | Freq: Every day | ORAL | 3 refills | Status: DC

## 2017-08-15 ENCOUNTER — Other Ambulatory Visit: Payer: Self-pay | Admitting: Family Medicine

## 2017-08-15 NOTE — Telephone Encounter (Signed)
Last OV 05/18/17 last filled 05/02/17 90 1rf

## 2017-08-16 ENCOUNTER — Other Ambulatory Visit: Payer: Self-pay

## 2017-08-21 ENCOUNTER — Other Ambulatory Visit: Payer: Self-pay | Admitting: *Deleted

## 2017-08-21 MED ORDER — LISINOPRIL 2.5 MG PO TABS: 2.5000 mg | ORAL_TABLET | Freq: Every day | ORAL | 1 refills | Status: DC

## 2017-08-29 ENCOUNTER — Other Ambulatory Visit: Payer: Self-pay | Admitting: *Deleted

## 2017-08-30 ENCOUNTER — Inpatient Hospital Stay (HOSPITAL_BASED_OUTPATIENT_CLINIC_OR_DEPARTMENT_OTHER): Payer: Medicare HMO | Admitting: Oncology

## 2017-08-30 ENCOUNTER — Other Ambulatory Visit: Payer: Self-pay

## 2017-08-30 ENCOUNTER — Inpatient Hospital Stay: Payer: Medicare HMO

## 2017-08-30 ENCOUNTER — Inpatient Hospital Stay: Payer: Medicare HMO | Attending: Oncology

## 2017-08-30 LAB — CBC WITH DIFFERENTIAL/PLATELET
Basophils Absolute: 0.1 10*3/uL (ref 0–0.1)
Basophils Relative: 1 %
Eosinophils Absolute: 0.1 10*3/uL (ref 0–0.7)
Eosinophils Relative: 2 %
HCT: 47.2 % (ref 40.0–52.0)
Hemoglobin: 16.4 g/dL (ref 13.0–18.0)
Lymphocytes Relative: 33 %
Lymphs Abs: 2.5 10*3/uL (ref 1.0–3.6)
MCH: 34.2 pg — ABNORMAL HIGH (ref 26.0–34.0)
MCHC: 34.7 g/dL (ref 32.0–36.0)
MCV: 98.7 fL (ref 80.0–100.0)
Monocytes Absolute: 0.6 10*3/uL (ref 0.2–1.0)
Monocytes Relative: 8 %
Neutro Abs: 4.2 10*3/uL (ref 1.4–6.5)
Neutrophils Relative %: 56 %
Platelets: 237 10*3/uL (ref 150–440)
RBC: 4.78 MIL/uL (ref 4.40–5.90)
RDW: 13.3 % (ref 11.5–14.5)
WBC: 7.4 10*3/uL (ref 3.8–10.6)

## 2017-08-30 LAB — FERRITIN: Ferritin: 584 ng/mL — ABNORMAL HIGH (ref 24–336)

## 2017-08-30 LAB — IRON AND TIBC
Iron: 92 ug/dL (ref 45–182)
Saturation Ratios: 28 % (ref 17.9–39.5)
TIBC: 334 ug/dL (ref 250–450)
UIBC: 242 ug/dL

## 2017-09-02 NOTE — Progress Notes (Signed)
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  INTERVAL HISTORY: Patient returns to clinic today for further evaluation, repeat laboratory work, and continuation of phlebotomy.  He continues to feel well and is asymptomatic.  He has no neurologic complaints.  He denies any recent fevers or illnesses.  He has a good appetite and denies weight loss.  He has no chest pain or shortness of breath.  He denies any nausea, vomiting, constipation, or diarrhea.  He has no urinary complaints.  Patient offers no specific complaints today.  REVIEW OF SYSTEMS:   Review of Systems  Constitutional: Negative.  Negative for fever, malaise/fatigue and weight loss.  Respiratory: Negative.  Negative for cough, hemoptysis and shortness of breath.   Cardiovascular: Negative.  Negative for chest pain and leg swelling.  Gastrointestinal: Negative.  Negative for abdominal pain, blood in stool and melena.  Genitourinary: Negative.  Negative for hematuria.  Musculoskeletal: Negative.   Skin: Negative.  Negative for rash.  Neurological: Negative.  Negative for sensory change, focal weakness and weakness.  Psychiatric/Behavioral: Negative.  The patient is not nervous/anxious.     As per HPI. Otherwise, a complete review of systems is negative.  PAST MEDICAL HISTORY: Past Medical History:  Diagnosis Date  . Chronic a-fib ([place])   . Chronic systolic CHF (congestive heart failure) ([place])   . Depression   . History of kidney stones   . History of pulmonary embolism   . Hyperlipidemia   . Hypertension   . Migraine   . Obesity   . OSA (obstructive sleep apnea)    on CPAP  . Osteoarthritis    bilateral knees  . Rocky Mountain spotted fever 2011     PAST SURGICAL HISTORY: Past Surgical History:  Procedure Laterality Date  . CARDIAC CATHETERIZATION  2012   [facility]  . KNEE SURGERY     right knee   . LITHOTRIPSY  07/31/2014  . URETERAL STENT PLACEMENT  2016  . URETEROSCOPY      FAMILY HISTORY: Family History  Problem Relation Age of Onset  . Thyroid disease Mother   . Cancer Father        lung  . Arthritis Father   . Hypertension Father   . Cancer Paternal Grandfather        Throat cancer    ADVANCED DIRECTIVES (Y/N):  N  HEALTH MAINTENANCE: Social History   Tobacco Use  . Smoking status: Never Smoker  . Smokeless tobacco: Never Used  Substance Use Topics  . Alcohol use: No    Frequency: Never    Comment: occasional; 3 times a year  . Drug use: No     Colonoscopy:  PAP:  Bone density:  Lipid panel:  Allergies  Allergen Reactions  . Allopurinol Hives and Swelling  . Diltiazem Itching and Rash  . Other Hives  . Penicillin G Nausea Only    And dirrhea  . Penicillins Other (See Comments)    Pt unsure of rxn; showed up on allergy test  . Ondansetron Rash    Current Outpatient Medications  Medication Sig Dispense Refill  . acetaminophen (TYLENOL) 500 MG tablet Take 1,000 mg by mouth every 6 (six) hours as needed.    Marland Kitchen apixaban (ELIQUIS) 5 MG TABS tablet Take 1 tablet (5 mg  total) by mouth 2 (two) times daily. 180 tablet 3  . Cholecalciferol (VITAMIN D) 2000 units CAPS     . digoxin (LANOXIN) 0.25 MG tablet Take one tablet (0.25 mg) by mouth once daily six days a week 30 tablet 1  . furosemide (LASIX) 40 MG tablet Take 40 mg by mouth daily as needed.     Marland Kitchen lisinopril (PRINIVIL,ZESTRIL) 2.5 MG tablet Take 1 tablet (2.5 mg total) by mouth daily. 90 tablet 1  . metoprolol succinate (TOPROL-XL) 100 MG 24 hr tablet TAKE 2 TABLETS EVERY MORNING  AND TAKE 1 TABLET EVERY EVENING 270 tablet 3  . Multiple Vitamins-Minerals (CENTRUM SILVER ADULT 50+) TABS Take 1 tablet by mouth daily.    . Potassium 95 MG TABS Take  1 tablet by mouth.    . pravastatin (PRAVACHOL) 80 MG tablet Take 1 tablet (80 mg total) by mouth every evening. 90 tablet 3  . traMADol (ULTRAM) 50 MG tablet TAKE 1 TABLET BY MOUTH EVERY 6 HOURS AS NEEDED 90 tablet 1  . verapamil (CALAN-SR) 120 MG CR tablet Take 1 tablet (120 mg total) by mouth daily. 90 tablet 3   No current facility-administered medications for this visit.     OBJECTIVE: Vitals:   08/30/17 0939  BP: (!) 145/90  Pulse: 87  Temp: (!) 97 F (36.1 C)     Body mass index is 47.7 kg/m.    ECOG FS:0 - Asymptomatic  General: Well-developed, well-nourished, no acute distress. Eyes: Pink conjunctiva, anicteric sclera. Lungs: Clear to auscultation  bilaterallyHeart: Regular rate and rhythm. No rubs, murmurs, or gallops. Abdomen: Soft, nontender, nondistended. No organomegaly noted, normoactive bowel sounds. Musculoskeletal: No edema, cyanosis, or clubbing. Neuro: Alert, answering all questions appropriately. Cranial nerves grossly intact. Skin: No rashes or petechiae noted. Psych: Normal affect.  LAB RESULTS:  Lab Results  Component Value Date   NA 136 06/22/2017   K 4.6 06/22/2017   CL 100 06/22/2017   CO2 26 06/22/2017   GLUCOSE 128 (H) 06/22/2017   BUN 16 06/22/2017   CREATININE 0.90 06/22/2017   CALCIUM 9.1 06/22/2017   PROT 6.5 06/22/2017   ALBUMIN 4.2 06/22/2017   AST 17 06/22/2017   ALT 19 06/22/2017   ALKPHOS 59 06/22/2017   BILITOT 0.7 06/22/2017   GFRNONAA >60 03/30/2017   GFRAA >60 03/30/2017    Lab Results  Component Value Date   WBC 7.4 08/30/2017   NEUTROABS 4.2 08/30/2017   HGB 16.4 08/30/2017   HCT 47.2 08/30/2017   MCV 98.7 08/30/2017   PLT 237 08/30/2017   Lab Results  Component Value Date   IRON 92 08/30/2017   TIBC 334 08/30/2017   IRONPCTSAT 28 08/30/2017   Lab Results  Component Value Date   FERRITIN 584 (H) 08/30/2017     STUDIES: No results found.  ASSESSMENT: Compound heterozygous hemochromatosis with C282Y  and H63D mutations  PLAN:    1. 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 Goal ferritin will be between 50 and 100.  Proceed with 400 mL of phlebotomy today.  Because patient is on Eliquis, he cannot receive [facility]bot[facility] the [facility].  Return to clinic in 4 weeks with lab and phlebotomy only and then in 8 weeks for further evaluation.  Approximately 30 minutes was spent in discussion of which greater than 50% was consultation.  Patient expressed understanding and was in agreement with this plan.  He also understands that He can call clinic at any time with any questions, concerns, or complaints.    Lloyd Huger, MD   09/02/2017 10:35 AM

## 2017-09-05 ENCOUNTER — Encounter: Payer: Medicare HMO | Attending: Surgery | Admitting: Dietician

## 2017-09-05 ENCOUNTER — Encounter: Payer: Self-pay | Admitting: Dietician

## 2017-09-05 VITALS — Ht 71.0 in | Wt 334.5 lb

## 2017-09-05 DIAGNOSIS — Z713 Dietary counseling and surveillance: Secondary | ICD-10-CM | POA: Insufficient documentation

## 2017-09-05 DIAGNOSIS — Z6841 Body Mass Index (BMI) 40.0 and over, adult: Secondary | ICD-10-CM

## 2017-09-05 NOTE — Progress Notes (Signed)
Appt start time: 1030 end time:  1120.  Assessment:   #4 SWL Appointment.   Start Wt at NDES: 342lbs Wt: 334.5lbs BMI: 46.65  Medical Changes: no changes  Preferred Learning Style:   Visual  Hands on  Learning Readiness:   Change in progress  MEDICATIONS: acetaminophen, apixaban, cholecalciferol, digoxin, furosemide, lisinopril, metoprolol succinate, Centrum silver multivitamin, potassium, pravastatin, traMADol, verapamil  Progress: Carlos Crosby continues to make progress with diet-- he reports ongoing effort to reduce food portions, reduce intake of starches other than some additional bread intake over the past month due to [place] Day holiday. He is working to stop eating when satisfied rather than full, and continues to expand variety of vegetables and fruits in his diet. He has been increasing physical activity and states he feels better, and his clothing is fitting more loosely. And he has been following online bariatric support group. He expresses some concern over potential problems with gas after surgery, since his ability to walk is limited.   DIETARY INTAKE:  24-hr recall:  Breakfast: oatmeal or eggs, or 2 small oranges  Snack: none or nuts or seeds  Lunch: reduced sodium Progresso soup or sandwich; has had hamburger with no fries; sometimes cooked meal with meat, veg, starch Snack: none or nuts or seeds, occasionally popcorn, but less frequent Dinner: meat and vegetables, reduced starch portion Snack: occasional popcorn; had small amount of chocolate 2x Beverages: water, sparkling water 100oz fluid daily  Usual physical activity: Water aerobics 60 minutes, 4x a week + swimming 1x a week  Diet to Follow: 1500 calories; Continuation of current eating pattern and balance              Nutritional Diagnosis:  [place]-3.3 Overweight/obesity related to history of excess calories and inactivity as evidenced by BMI of 46.65, and patient engaging in efforts to control  calories and increase physical activity.              Intervention:  Nutrition counseling for upcoming Bariatric Surgery.   Commended patient for ongoing positive changes.   Goals for this month are to continue with current healthy habits.  Teaching Method Utilized:  Visual Auditory Hands on  Handouts given during visit include:  Bloating and Gas (AND)  Goals and instructions  Barriers to learning/adherence to lifestyle change: none  Demonstrated degree of understanding via:  Teach Back   Monitoring/Evaluation:  Dietary intake, exercise, and body weight 10/09/17.

## 2017-09-05 NOTE — Patient Instructions (Signed)
   Continue making healthy food choices, including protein + vegetables + fruit and/or small portion of starches.   Keep up your regular exercise-- great job!

## 2017-09-07 ENCOUNTER — Other Ambulatory Visit: Payer: Self-pay

## 2017-09-07 ENCOUNTER — Ambulatory Visit: Payer: Self-pay | Admitting: Oncology

## 2017-09-27 ENCOUNTER — Inpatient Hospital Stay: Payer: Medicare HMO

## 2017-09-27 ENCOUNTER — Inpatient Hospital Stay: Payer: Medicare HMO | Attending: Oncology

## 2017-09-27 LAB — CBC WITH DIFFERENTIAL/PLATELET
Basophils Absolute: 0.1 10*3/uL (ref 0–0.1)
Basophils Relative: 1 %
Eosinophils Absolute: 0.1 10*3/uL (ref 0–0.7)
Eosinophils Relative: 2 %
HCT: 46.1 % (ref 40.0–52.0)
Hemoglobin: 16.1 g/dL (ref 13.0–18.0)
Lymphocytes Relative: 25 %
Lymphs Abs: 2.5 10*3/uL (ref 1.0–3.6)
MCH: 34.1 pg — ABNORMAL HIGH (ref 26.0–34.0)
MCHC: 35 g/dL (ref 32.0–36.0)
MCV: 97.6 fL (ref 80.0–100.0)
Monocytes Absolute: 1 10*3/uL (ref 0.2–1.0)
Monocytes Relative: 10 %
Neutro Abs: 6.3 10*3/uL (ref 1.4–6.5)
Neutrophils Relative %: 62 %
Platelets: 235 10*3/uL (ref 150–440)
RBC: 4.73 MIL/uL (ref 4.40–5.90)
RDW: 12.9 % (ref 11.5–14.5)
WBC: 10 10*3/uL (ref 3.8–10.6)

## 2017-09-27 LAB — IRON AND TIBC
Iron: 87 ug/dL (ref 45–182)
Saturation Ratios: 28 % (ref 17.9–39.5)
TIBC: 315 ug/dL (ref 250–450)
UIBC: 228 ug/dL

## 2017-09-27 LAB — FERRITIN: Ferritin: 403 ng/mL — ABNORMAL HIGH (ref 24–336)

## 2017-09-27 NOTE — Progress Notes (Signed)
Proceed with phlebotomy today based on HGB results per Dr. Grayland Ormond.

## 2017-09-28 ENCOUNTER — Ambulatory Visit (INDEPENDENT_AMBULATORY_CARE_PROVIDER_SITE_OTHER): Payer: Medicare HMO | Admitting: Internal Medicine

## 2017-09-28 ENCOUNTER — Encounter: Payer: Self-pay | Admitting: Internal Medicine

## 2017-09-28 VITALS — BP 100/70 | HR 86 | Ht 71.0 in | Wt 337.0 lb

## 2017-09-28 DIAGNOSIS — I481 Persistent atrial fibrillation: Secondary | ICD-10-CM

## 2017-09-28 DIAGNOSIS — G4730 Sleep apnea, unspecified: Secondary | ICD-10-CM | POA: Diagnosis not present

## 2017-09-28 DIAGNOSIS — G4733 Obstructive sleep apnea (adult) (pediatric): Secondary | ICD-10-CM | POA: Diagnosis not present

## 2017-09-28 DIAGNOSIS — I4819 Other persistent atrial fibrillation: Secondary | ICD-10-CM

## 2017-09-28 NOTE — Patient Instructions (Addendum)
Medication Instructions: - Your physician recommends that you continue on your current medications as directed. Please refer to the Current Medication list given to you today.  Labwork: - none ordered  Procedures/Testing: - Your physician has requested that you have an echocardiogram. Echocardiography is a painless test that uses sound waves to create images of your heart. It provides your doctor with information about the size and shape of your heart and how well your heart's chambers and valves are working. This procedure takes approximately one hour. There are no restrictions for this procedure.  - there is chance that an IV will need to be started during your echo to inject an image enhancing agent - please come well hydrated to your appointment  Follow-Up: - Your physician wants you to follow-up in: 1 year with Dr. Caryl Comes. You will receive a reminder letter in the mail two months in advance. If you don't receive a letter, please call our office to schedule the follow-up appointment.   Any Additional Special Instructions Will Be Listed Below (If Applicable).     If you need a refill on your cardiac medications before your next appointment, please call your pharmacy.

## 2017-09-28 NOTE — Progress Notes (Signed)
Patient Care Team: Leone Haven, MD as PCP - General (Family Medicine)   HPI  Carlos Crosby is a 62 y.o. male Seen in follow-up because of persistent atrial fibrillation dating back to 2014. He declined catheter ablation at [facility] at that time. A strategy of rate control had been pursued; it has been largely unsuccessful. Interval assessment of left ventricular systolic function has demonstrated significant worsening with an ejection fraction of 30-35%. This was 12/16. Repeat echocardiogram last week demonstrates no interval improvement. DATE TEST    12/16    Echo    EF 30-35 %   6/17    Echo   EF 45-50 %   7/17 Myoview EF 42% No ischemia  10/18 Echo EF 40-45%     DATE TEST Mean HR   2/16    Holter 93 (52-185)      6/17    Holter 75(38-132)         Date CR K Hgb Dig  1/17     1.1  8/18 0.99 4.6  0.5 (10/18)   1/19 0.90 4.6 16.7    Baseline shortness of breath but otherwise is feeling good.  No edema.  He is anticipating gastric sleeve surgery 7/19.  No bleeding  Hemoglobin A1c 5.7 1/19  Past Medical History:  Diagnosis Date  . Chronic a-fib ([place])   . Chronic systolic CHF (congestive heart failure) ([place])   . Depression   . Hemochromatosis   . History of kidney stones   . History of pulmonary embolism   . Hyperlipidemia   . Hypertension   . Migraine   . Obesity   . OSA (obstructive sleep apnea)    on CPAP  . Osteoarthritis    bilateral knees  . Rocky Mountain spotted fever 2011    Past Surgical History:  Procedure Laterality Date  . CARDIAC CATHETERIZATION  2012   [facility]  . KNEE SURGERY     right knee   . LITHOTRIPSY  07/31/2014  . URETERAL STENT PLACEMENT  2016  . URETEROSCOPY      Current Outpatient Medications  Medication Sig Dispense Refill  . acetaminophen (TYLENOL) 500 MG tablet Take 1,000 mg by mouth every 6 (six) hours as needed.    Marland Kitchen apixaban (ELIQUIS) 5 MG TABS tablet Take 1 tablet (5 mg total) by mouth 2 (two) times daily. 180 tablet 3   . Cholecalciferol (VITAMIN D) 2000 units CAPS     . digoxin (LANOXIN) 0.25 MG tablet Take one tablet (0.25 mg) by mouth once daily six days a week 30 tablet 1  . furosemide (LASIX) 40 MG tablet Take 40 mg by mouth daily as needed.     Marland Kitchen lisinopril (PRINIVIL,ZESTRIL) 2.5 MG tablet Take 1 tablet (2.5 mg total) by mouth daily. 90 tablet 1  . metoprolol succinate (TOPROL-XL) 100 MG 24 hr tablet TAKE 2 TABLETS EVERY MORNING  AND TAKE 1 TABLET EVERY EVENING 270 tablet 3  . Multiple Vitamins-Minerals (CENTRUM SILVER ADULT 50+) TABS Take 1 tablet by mouth daily.    . Potassium 95 MG TABS Take 1 tablet by mouth.    . pravastatin (PRAVACHOL) 80 MG tablet Take 1 tablet (80 mg total) by mouth every evening. 90 tablet 3  . traMADol (ULTRAM) 50 MG tablet TAKE 1 TABLET BY MOUTH EVERY 6 HOURS AS NEEDED 90 tablet 1  . verapamil (CALAN-SR) 120 MG CR tablet Take 1 tablet (120 mg total) by mouth daily. 90 tablet 3  No current facility-administered medications for this visit.     Allergies  Allergen Reactions  . Allopurinol Hives and Swelling  . Other Hives  . Diltiazem Itching and Rash  . Penicillin G Nausea Only    And dirrhea  . Ondansetron Rash  . Ondansetron Hcl Rash  . Penicillins Other (See Comments) and Nausea Only    Pt unsure of rxn; showed up on allergy test Pt unsure of rxn; showed up on allergy test Pt unsure of rxn; showed up on allergy test And dirrhea       Review of Systems negative except from HPI and PMH  Physical Exam BP 100/70 (BP Location: Left Arm, Patient Position: Sitting, Cuff Size: Large)   Pulse 86   Ht 5\' 11"  (1.803 m)   Wt (!) 337 lb (152.9 kg)   BMI 47.00 kg/m  Well developed and Morbidly obese  in no acute distress HENT normal Neck supple with JVP-flat Carotids brisk and full without bruits Clear Irregularly irregular rate and rhythm with controlled ventricular response, no murmurs or gallops Abd-soft with active BS without hepatomegaly No Clubbing  cyanosis edema Skin-warm and dry A & Oriented  Grossly normal sensory and motor function   ECG atrial fibrillation at 86 -/09/36  Assessment and  Plan   Afib  now controlled ventricular response  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  Ventricular response is controlled.  No bleeding on apixaban.  He needs a repeat echo in anticipation of his gastric sleeve surgery.  If LV function is stable and symptoms remain stable should be at acceptable risk  We spent more than 50% of our >25 min visit in face to face counseling regarding the above

## 2017-10-09 ENCOUNTER — Encounter: Payer: Medicare HMO | Attending: Surgery | Admitting: Dietician

## 2017-10-09 ENCOUNTER — Ambulatory Visit
Admission: RE | Admit: 2017-10-09 | Discharge: 2017-10-09 | Disposition: A | Discharge status: Home / Self Care | Payer: Medicare HMO | Source: Ambulatory Visit | Attending: Urology | Admitting: Urology

## 2017-10-09 ENCOUNTER — Encounter: Payer: Self-pay | Admitting: Dietician

## 2017-10-09 VITALS — Ht 71.0 in | Wt 334.7 lb

## 2017-10-09 DIAGNOSIS — N200 Calculus of kidney: Secondary | ICD-10-CM | POA: Diagnosis not present

## 2017-10-09 DIAGNOSIS — Z6841 Body Mass Index (BMI) 40.0 and over, adult: Secondary | ICD-10-CM

## 2017-10-09 DIAGNOSIS — Z713 Dietary counseling and surveillance: Secondary | ICD-10-CM | POA: Diagnosis not present

## 2017-10-09 NOTE — Patient Instructions (Signed)
   Continue with current eating pattern and regular exercise, great job!  Start taste-testing some protein shakes in preparation for pre-op and post-op diets.

## 2017-10-09 NOTE — Progress Notes (Signed)
Appt start time: 1000 end time:  1030.  Assessment:   #5 SWL Appointment.   Start Wt at NDES: 342lbs Wt: 334.7lbs BMI: 46.68  Preferred Learning Style:   Visual  Hands on   Learning Readiness:   Change in progress  Medications: acetaminophen prn, apixaban, cholecalciferol, digoxin, furosemide, lisinopril, metoprolol succinate, Centrum silver multivitamin, potassium, pravastatin, traMADol, verapamil  Progress: Patient reports his clothes fitting more loosely, feeling better; reports others have noticed change in his appearance (weight loss). He does continue to have significant knee pain. He reports having questions regarding use of his blood-thinning medications (has history of blood clot and wants to make sure to avoid another one), gas issues after surgery, and best way to exercise after surgery to avoid knee pain and risk of infection with water exercise.   Dietary Intake: Reduced use of butter, cheese  24-hr recall:  Breakfast: oatmeal with 2 clementine oranges  Snk ( AM): occasionally nuts or seeds  Lunch: sandwich or soup or cooked meal--meat, veg Snack: same as am Dinner: meat, low-carb veg., small portions of any starches Snack: popcorn maybe once a week Beverages: water, sparkling water  Usual physical activity: water aerobics 60 minutes 4x a week; has had some time with decreased exercise, after out of town trip and period of increased knee pain.   Diet to Follow: 1500 calories, low carb               Nutritional Diagnosis:  [place]-3.3 Overweight/obesity related to past poor dietary habits and physical inactivity as evidenced by patient with BMI of 46.68, now following dietary guidelines for continued weight loss.              Intervention:  Nutrition counseling for upcoming Bariatric Surgery.   Plan is to continue with current eating pattern and regular exercise.  Teaching Method Utilized:  Visual Auditory  Handouts given during visit include:  Goals and  instructions   Barriers to learning/adherence to lifestyle change: none  Demonstrated degree of understanding via:  Teach Back   Monitoring/Evaluation:  Dietary intake, exercise, and body weight 11/06/17.

## 2017-10-10 ENCOUNTER — Encounter: Payer: Self-pay | Admitting: Dietician

## 2017-10-11 ENCOUNTER — Encounter: Payer: Self-pay | Admitting: Urology

## 2017-10-11 ENCOUNTER — Other Ambulatory Visit: Payer: Self-pay

## 2017-10-11 ENCOUNTER — Ambulatory Visit: Payer: Medicare HMO | Admitting: Urology

## 2017-10-11 ENCOUNTER — Telehealth: Payer: Self-pay | Admitting: Urology

## 2017-10-11 ENCOUNTER — Ambulatory Visit (INDEPENDENT_AMBULATORY_CARE_PROVIDER_SITE_OTHER): Payer: Medicare HMO

## 2017-10-11 VITALS — BP 127/88 | HR 91 | Ht 71.0 in | Wt 334.0 lb

## 2017-10-11 DIAGNOSIS — I481 Persistent atrial fibrillation: Secondary | ICD-10-CM

## 2017-10-11 DIAGNOSIS — N200 Calculus of kidney: Secondary | ICD-10-CM

## 2017-10-11 DIAGNOSIS — I4819 Other persistent atrial fibrillation: Secondary | ICD-10-CM

## 2017-10-11 NOTE — Progress Notes (Signed)
10/11/2017 9:28 AM   Carlos Crosby 07-22-1955 [id number]  Referring provider: Leone Haven, MD [address] [place], [place] [address]  Chief Complaint  Patient presents with  . Nephrolithiasis    3 mo w/KUB    HPI: 62 year old male who returns today to follow-up for recurrent stone disease.   Since last visit 3 months ago, he has had no further episodes of flank pain, gross hematuria or stone episodes.  He is been doing very well.  KUB today shows stable left lower pole stone burden. He is now near the end of completing his work-up for gastric bypass surgery and hopefully will be a seizure in the next few months.  His surgeon is. He is anxiously awaiting this.  He is also been diagnosed with hemochromatosis since last visit.  Previous stone analysis from 2016 showed 79% calcium oxalate monohydrate, 10% calcium oxalate dihydrate, 15% calcium phosphate.   He underwent 56-OZHY urine metabolic work-up which was fairly unremarkable.  Please see previous note for details.   He is on multiple stone procedures in the past including PCNL, ureteroscopy and shockwave lithotripsy.  He does have a personal history of gout.  He does report that his uric acid level was markedly elevated in the past.  He is allergic to allopurinol.  Most recent uric acid level within normal limits.   PMH: Past Medical History:  Diagnosis Date  . Chronic a-fib ([place])   . Chronic systolic CHF (congestive heart failure) ([place])   . Depression   . Hemochromatosis   . History of kidney stones   . History of pulmonary embolism   . Hyperlipidemia   . Hypertension   . Migraine   . Obesity   . OSA (obstructive sleep apnea)    on CPAP  . Osteoarthritis    bilateral knees  . Rocky Mountain spotted fever 2011    Surgical History: Past Surgical History:  Procedure Laterality Date  . CARDIAC CATHETERIZATION  2012   [facility]  . KNEE SURGERY     right knee   . LITHOTRIPSY  07/31/2014  .  URETERAL STENT PLACEMENT  2016  . URETEROSCOPY      Home Medications:  Allergies as of 10/11/2017      Reactions   Allopurinol Hives, Swelling   Other Hives   Diltiazem Itching, Rash   Penicillin G Nausea Only   And dirrhea   Ondansetron Rash   Ondansetron Hcl Rash   Penicillins Other (See Comments), Nausea Only   Pt unsure of rxn; showed up on allergy test Pt unsure of rxn; showed up on allergy test Pt unsure of rxn; showed up on allergy test And dirrhea      Medication List        Accurate as of 10/11/17  9:28 AM. Always use your most recent med list.          acetaminophen 500 MG tablet Commonly known as:  TYLENOL Take 1,000 mg by mouth every 6 (six) hours as needed.   apixaban 5 MG Tabs tablet Commonly known as:  ELIQUIS Take 1 tablet (5 mg total) by mouth 2 (two) times daily.   CENTRUM SILVER ADULT 50+ Tabs Take 1 tablet by mouth daily.   digoxin 0.25 MG tablet Commonly known as:  LANOXIN Take one tablet (0.25 mg) by mouth once daily six days a week   furosemide 40 MG tablet Commonly known as:  LASIX Take 40 mg by mouth daily as needed.  lisinopril 2.5 MG tablet Commonly known as:  PRINIVIL,ZESTRIL Take 1 tablet (2.5 mg total) by mouth daily.   metoprolol succinate 100 MG 24 hr tablet Commonly known as:  TOPROL-XL TAKE 2 TABLETS EVERY MORNING  AND TAKE 1 TABLET EVERY EVENING   Potassium 95 MG Tabs Take 1 tablet by mouth.   pravastatin 80 MG tablet Commonly known as:  PRAVACHOL Take 1 tablet (80 mg total) by mouth every evening.   traMADol 50 MG tablet Commonly known as:  ULTRAM TAKE 1 TABLET BY MOUTH EVERY 6 HOURS AS NEEDED   verapamil 120 MG CR tablet Commonly known as:  CALAN-SR Take 1 tablet (120 mg total) by mouth daily.   Vitamin D 2000 units Caps       Allergies:  Allergies  Allergen Reactions  . Allopurinol Hives and Swelling  . Other Hives  . Diltiazem Itching and Rash  . Penicillin G Nausea Only    And dirrhea  .  Ondansetron Rash  . Ondansetron Hcl Rash  . Penicillins Other (See Comments) and Nausea Only    Pt unsure of rxn; showed up on allergy test Pt unsure of rxn; showed up on allergy test Pt unsure of rxn; showed up on allergy test And dirrhea     Family History: Family History  Problem Relation Age of Onset  . Thyroid disease Mother   . Cancer Father        lung  . Arthritis Father   . Hypertension Father   . Cancer Paternal Grandfather        Throat cancer    Social History:  reports that he has never smoked. He has never used smokeless tobacco. He reports that he does not drink alcohol or use drugs.  ROS: UROLOGY Frequent Urination?: No Hard to postpone urination?: No Burning/pain with urination?: No Get up at night to urinate?: No Leakage of urine?: No Urine stream starts and stops?: No Trouble starting stream?: No Do you have to strain to urinate?: No Blood in urine?: No Urinary tract infection?: No Sexually transmitted disease?: No Injury to kidneys or bladder?: No Painful intercourse?: No Weak stream?: No Erection problems?: No Penile pain?: No  Gastrointestinal Nausea?: No Vomiting?: No Indigestion/heartburn?: No Diarrhea?: No Constipation?: No  Constitutional Fever: No Night sweats?: No Weight loss?: No Fatigue?: No  Skin Skin rash/lesions?: No Itching?: No  Eyes Blurred vision?: No Double vision?: No  Ears/Nose/Throat Sore throat?: No Sinus problems?: No  Hematologic/Lymphatic Swollen glands?: No Easy bruising?: No  Cardiovascular Leg swelling?: No Chest pain?: No  Respiratory Cough?: No Shortness of breath?: No  Endocrine Excessive thirst?: No  Musculoskeletal Back pain?: No Joint pain?: No  Neurological Headaches?: No Dizziness?: No  Psychologic Depression?: No Anxiety?: No  Physical Exam: BP 127/88   Pulse 91   Ht 5\' 11"  (1.803 m)   Wt (!) 334 lb (151.5 kg)   BMI 46.58 kg/m   Constitutional:  Alert and  oriented, No acute distress. HEENT: [place] AT, moist mucus membranes.  Trachea midline, no masses. Respiratory: Normal respiratory effort, no increased work of breathing. GI: Abdomen morbidly obese. Neurologic: Grossly intact, no focal deficits, moving all 4 extremities. Psychiatric: Normal mood and affect.  Laboratory Data: Lab Results  Component Value Date   WBC 10.0 09/27/2017   HGB 16.1 09/27/2017   HCT 46.1 09/27/2017   MCV 97.6 09/27/2017   PLT 235 09/27/2017    Lab Results  Component Value Date   CREATININE 0.90 06/22/2017  Lab Results  Component Value Date   PSA 0.16 01/26/2017    Lab Results  Component Value Date   HGBA1C 5.7 06/22/2017    Urinalysis N/a  Pertinent Imaging: Results for orders placed during the hospital encounter of 10/09/17  DG Abd 1 View   Narrative CLINICAL DATA:  History of kidney stones. Recheck size of 3 left kidney stones.  EXAM: ABDOMEN - 1 VIEW  COMPARISON:  July 10, 2017  FINDINGS: No right-sided renal stones are noted. There appear to be 3 or 4 small stones in the lower left kidney. The largest measures 5.8 mm, similar in the interval. Phleboliths are seen in the right pelvis. No definitive ureteral stones.  IMPRESSION: Stable stones in the lower left kidney. The largest measures approximately 5.8 mm.   Electronically Signed   By: Dorise Bullion III M.D   On: 10/09/2017 14:14    KUB personally reviewed today and with the patient.  This was compared to previous additional KUBs and the stone burden appears to be stable without interval growth or new stones.  Assessment & Plan:    1. Kidney stone on left side Stable left lower pole stone burden We reviewed stone prevention techniques again today and strategies for stone prevention following his gastric bypass At this point time, we will hold off on further intervention for stones until he is recovered adequately from his gastric bypass We will have him make an  appointment 3 months after his gastric bypass surgery to reassess stone burden with KUB  Return for 3 months following gastric bypass with KUB.  Hollice Espy, MD  [facility] [address] [place], [place] [address] [phone]

## 2017-10-11 NOTE — Telephone Encounter (Signed)
FYI - pt did not wish to sched 3 mos follow at check out states Dr. Erlene Quan told him to message her on MyChart after his bypass surgery.  Pt will make appt after recovery of bypass.

## 2017-10-12 LAB — ECHOCARDIOGRAM COMPLETE
Height: 71 in
Weight: 5344 oz

## 2017-10-16 ENCOUNTER — Telehealth: Payer: Self-pay | Admitting: Internal Medicine

## 2017-10-16 NOTE — Telephone Encounter (Signed)
I called and spoke with the patient regarding his echo results. He would like a copy of this faxed to Dr. Hassell Done at [facility]- pending gastric sleeve surgery.  Copy of the report was faxed to [phone]. Confirmation received.  The patient also requested samples of eliquis 5 mg tablets.  Samples provided: Eliquis 5 mg Lot: [id number] Exp: 11/2019 #3 boxes

## 2017-10-22 NOTE — Progress Notes (Signed)
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  INTERVAL HISTORY: Patient returns to clinic today for repeat laboratory work and further evaluation.  He currently feels well and remains asymptomatic. He has no neurologic complaints.  He denies any recent fevers or illnesses.  He has a good appetite and denies weight loss.  He has no chest pain or shortness of breath.  He denies any nausea, vomiting, constipation, or diarrhea.  He has no urinary complaints.  Patient feels at his baseline offers no specific complaints today.  REVIEW OF SYSTEMS:   Review of Systems  Constitutional: Negative.  Negative for fever, malaise/fatigue and weight loss.  Respiratory: Negative.  Negative for cough, hemoptysis and shortness of breath.   Cardiovascular: Negative.  Negative for chest pain and leg swelling.  Gastrointestinal: Negative.  Negative for abdominal pain, blood in stool and melena.  Genitourinary: Negative.  Negative for hematuria.  Musculoskeletal: Negative.  Negative for back pain.  Skin: Negative.  Negative for rash.  Neurological: Negative.  Negative for sensory change, focal weakness and weakness.  Psychiatric/Behavioral: Negative.  The patient is not nervous/anxious.     As per HPI. Otherwise, a complete review of systems is negative.  PAST MEDICAL HISTORY: Past Medical History:  Diagnosis Date  . Chronic a-fib ([place])   . Chronic systolic CHF (congestive heart failure) ([place])   . Depression   . Hemochromatosis   . History of kidney stones   . History of pulmonary embolism   . Hyperlipidemia   . Hypertension   . Migraine   . Obesity   . OSA (obstructive sleep apnea)    on CPAP  . Osteoarthritis    bilateral knees  .  Rocky Mountain spotted fever 2011    PAST SURGICAL HISTORY: Past Surgical History:  Procedure Laterality Date  . CARDIAC CATHETERIZATION  2012   [facility]  . KNEE SURGERY     right knee   . LITHOTRIPSY  07/31/2014  . URETERAL STENT PLACEMENT  2016  . URETEROSCOPY      FAMILY HISTORY: Family History  Problem Relation Age of Onset  . Thyroid disease Mother   . Cancer Father        lung  . Arthritis Father   . Hypertension Father   . Cancer Paternal Grandfather        Throat cancer    ADVANCED DIRECTIVES (Y/N):  N  HEALTH MAINTENANCE: Social History   Tobacco Use  . Smoking status: Never Smoker  . Smokeless tobacco: Never Used  Substance Use Topics  . Alcohol use: No    Frequency: Never    Comment: occasional; 3 times a year  . Drug use: No     Colonoscopy:  PAP:  Bone density:  Lipid panel:  Allergies  Allergen Reactions  . Allopurinol Hives and Swelling  . Other Hives  . Diltiazem Itching and Rash  . Penicillin G Nausea Only    And dirrhea  . Ondansetron Rash  . Ondansetron Hcl Rash  . Penicillins Other (See Comments) and Nausea Only    Pt unsure of rxn; showed up on allergy test Pt unsure of rxn; showed up on allergy test Pt unsure of rxn; showed up on allergy test And dirrhea     Current Outpatient Medications  Medication  Sig Dispense Refill  . apixaban (ELIQUIS) 5 MG TABS tablet Take 1 tablet (5 mg total) by mouth 2 (two) times daily. 180 tablet 3  . Cholecalciferol (VITAMIN D) 2000 units CAPS     . digoxin (LANOXIN) 0.25 MG tablet Take one tablet (0.25 mg) by mouth once daily six days a week 30 tablet 1  . lisinopril (PRINIVIL,ZESTRIL) 2.5 MG tablet Take 1 tablet (2.5 mg total) by mouth daily. 90 tablet 1  . metoprolol succinate (TOPROL-XL) 100 MG 24 hr tablet TAKE 2 TABLETS EVERY MORNING  AND TAKE 1 TABLET EVERY EVENING 270 tablet 3  . Multiple Vitamins-Minerals (CENTRUM SILVER ADULT 50+) TABS Take 1 tablet by mouth daily.    . Potassium 95 MG TABS  Take 1 tablet by mouth.    . pravastatin (PRAVACHOL) 80 MG tablet Take 1 tablet (80 mg total) by mouth every evening. 90 tablet 3  . traMADol (ULTRAM) 50 MG tablet TAKE 1 TABLET BY MOUTH EVERY 6 HOURS AS NEEDED 90 tablet 1  . verapamil (CALAN-SR) 120 MG CR tablet Take 1 tablet (120 mg total) by mouth daily. 90 tablet 3  . acetaminophen (TYLENOL) 500 MG tablet Take 1,000 mg by mouth every 6 (six) hours as needed.    . furosemide (LASIX) 40 MG tablet Take 40 mg by mouth daily as needed.      No current facility-administered medications for this visit.     OBJECTIVE: Vitals:   10/25/17 1024  BP: (!) 139/96  Pulse: 88  Resp: 18  Temp: (!) 97.2 F (36.2 C)     Body mass index is 47.48 kg/m.    ECOG FS:0 - Asymptomatic  General: Well-developed, well-nourished, no acute distress. Eyes: Pink conjunctiva, anicteric sclera. Lungs: Clear to auscultation bilaterally. Heart: Regular rate and rhythm. No rubs, murmurs, or gallops. Abdomen: Soft, nontender, nondistended. No organomegaly noted, normoactive bowel sounds. Musculoskeletal: No edema, cyanosis, or clubbing. Neuro: Alert, answering all questions appropriately. Cranial nerves grossly intact. Skin: No rashes or petechiae noted. Psych: Normal affect.  LAB RESULTS:  Lab Results  Component Value Date   NA 136 06/22/2017   K 4.6 06/22/2017   CL 100 06/22/2017   CO2 26 06/22/2017   GLUCOSE 128 (H) 06/22/2017   BUN 16 06/22/2017   CREATININE 0.90 06/22/2017   CALCIUM 9.1 06/22/2017   PROT 6.5 06/22/2017   ALBUMIN 4.2 06/22/2017   AST 17 06/22/2017   ALT 19 06/22/2017   ALKPHOS 59 06/22/2017   BILITOT 0.7 06/22/2017   GFRNONAA >60 03/30/2017   GFRAA >60 03/30/2017    Lab Results  Component Value Date   WBC 7.2 10/25/2017   NEUTROABS 4.1 10/25/2017   HGB 15.9 10/25/2017   HCT 45.0 10/25/2017   MCV 97.1 10/25/2017   PLT 213 10/25/2017   Lab Results  Component Value Date   IRON 128 10/25/2017   TIBC 324 10/25/2017    IRONPCTSAT 40 (H) 10/25/2017   Lab Results  Component Value Date   FERRITIN 338 (H) 10/25/2017     STUDIES: Dg Abd 1 View  Result Date: 10/09/2017 CLINICAL DATA:  History of kidney stones. Recheck size of 3 left kidney stones. EXAM: ABDOMEN - 1 VIEW COMPARISON:  July 10, 2017 FINDINGS: No right-sided renal stones are noted. There appear to be 3 or 4 small stones in the lower left kidney. The largest measures 5.8 mm, similar in the interval. Phleboliths are seen in the right pelvis. No definitive ureteral stones. IMPRESSION: Stable stones in the  lower left kidney. The largest measures approximately 5.8 mm. 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  Goal ferritin will be between 50 and 100.  Proceed with 400 mL of phlebotomy today.  Because patient is on Eliquis, he cannot receive his phlebotomy through [facility].  Return to clinic in 1 and 2 months for lab and phlebotomy only and then in 3 months for further evaluation.  Approximately 20 minutes was spent in discussion of which greater than 50% was consultation.  Patient expressed understanding and was in agreement with this plan. He also understands that He can call clinic at any time with any questions, concerns, or complaints.    Lloyd Huger, MD   10/29/2017 9:14 AM

## 2017-10-24 ENCOUNTER — Other Ambulatory Visit: Payer: Self-pay | Admitting: *Deleted

## 2017-10-25 ENCOUNTER — Inpatient Hospital Stay (HOSPITAL_BASED_OUTPATIENT_CLINIC_OR_DEPARTMENT_OTHER): Payer: Medicare HMO | Admitting: Oncology

## 2017-10-25 ENCOUNTER — Inpatient Hospital Stay: Payer: Medicare HMO

## 2017-10-25 ENCOUNTER — Other Ambulatory Visit: Payer: Self-pay

## 2017-10-25 ENCOUNTER — Inpatient Hospital Stay: Payer: Medicare HMO | Attending: Oncology

## 2017-10-25 DIAGNOSIS — Z7901 Long term (current) use of anticoagulants: Secondary | ICD-10-CM | POA: Diagnosis not present

## 2017-10-25 LAB — CBC WITH DIFFERENTIAL/PLATELET
Basophils Absolute: 0.1 10*3/uL (ref 0–0.1)
Basophils Relative: 1 %
Eosinophils Absolute: 0.2 10*3/uL (ref 0–0.7)
Eosinophils Relative: 2 %
HCT: 45 % (ref 40.0–52.0)
Hemoglobin: 15.9 g/dL (ref 13.0–18.0)
Lymphocytes Relative: 32 %
Lymphs Abs: 2.3 10*3/uL (ref 1.0–3.6)
MCH: 34.2 pg — ABNORMAL HIGH (ref 26.0–34.0)
MCHC: 35.3 g/dL (ref 32.0–36.0)
MCV: 97.1 fL (ref 80.0–100.0)
Monocytes Absolute: 0.5 10*3/uL (ref 0.2–1.0)
Monocytes Relative: 8 %
Neutro Abs: 4.1 10*3/uL (ref 1.4–6.5)
Neutrophils Relative %: 57 %
Platelets: 213 10*3/uL (ref 150–440)
RBC: 4.63 MIL/uL (ref 4.40–5.90)
RDW: 12.9 % (ref 11.5–14.5)
WBC: 7.2 10*3/uL (ref 3.8–10.6)

## 2017-10-25 LAB — FERRITIN: Ferritin: 338 ng/mL — ABNORMAL HIGH (ref 24–336)

## 2017-10-25 LAB — IRON AND TIBC
Iron: 128 ug/dL (ref 45–182)
Saturation Ratios: 40 % — ABNORMAL HIGH (ref 17.9–39.5)
TIBC: 324 ug/dL (ref 250–450)
UIBC: 196 ug/dL

## 2017-10-25 NOTE — Progress Notes (Signed)
Here for follow up. Per pt " im doing good no problems "

## 2017-10-30 ENCOUNTER — Encounter: Payer: Self-pay | Admitting: Oncology

## 2017-11-01 ENCOUNTER — Encounter: Payer: Self-pay | Admitting: Cardiovascular Disease

## 2017-11-06 ENCOUNTER — Encounter: Payer: Self-pay | Admitting: Dietician

## 2017-11-06 ENCOUNTER — Encounter: Payer: Medicare HMO | Attending: Surgery | Admitting: Dietician

## 2017-11-06 VITALS — Ht 71.0 in | Wt 334.9 lb

## 2017-11-06 DIAGNOSIS — Z6841 Body Mass Index (BMI) 40.0 and over, adult: Secondary | ICD-10-CM

## 2017-11-06 DIAGNOSIS — Z713 Dietary counseling and surveillance: Secondary | ICD-10-CM | POA: Diagnosis not present

## 2017-11-06 NOTE — Patient Instructions (Signed)
   Check into options for protein shakes that meet the criteria of 5g total carbohydrate or less (after subtracting any fiber grams) + at least 15grams of protein.   Keep carbohydrate intake to 30grams or less with meals and snacks. 2/3 cup rice, 1 cup or less potatoes, 6 cups of popcorn, or 2 slices bread are close to 30grams. Add extra protein or low-carb veggies if still hungry.

## 2017-11-06 NOTE — Progress Notes (Signed)
Appt start time: 1045 end time:  1145.  Assessment:   #6 of 6 SWL Appointment.   Start Wt at [facility]: 342.8lbs Ht: 5'11" Wt: 334.9lbs BMI: 46.71  Preferred Learning Style:   Visual  Hands on    Learning Readiness:   Change in progress  MEDICATIONS: acetaminophen prn, cholecalciferol, digoxin, furosemide, lisinopril, metoprolol succinate, Centrum silver multivitamin, potassium, pravastatin, traMADol, verapamil  DIETARY INTAKE: Patient reports further reducing his intake of butter and other fats, was buying 2lbs every 2 weeks, now using about 1/2 the amount in that period of time.   24-hr recall:  Breakfast: oatmeal and 2 clementines; currently skipping at times due to early exercise.   Snack: none  Lunch: sandwich or soup; occasional burger in past month due to holiday cookout and wife's birthday Snack: sometimes pistachios or seeds Dinner: meat, veg ie green beans, some corn in past month, some rice or mashed potatoes Snack: popcorn 1x a week, sometimes popcorn for dinner.  Beverages: water, sparkling water  Usual physical activity: water exercise 60 minutes, 4x a week + extra walking  Diet to Follow: 1500 calories 30 g carbohydrates              Nutritional Diagnosis:  Tat Momoli-3.3 Overweight/obesity related to history of excess calories and physical inactivity as evidenced by patient with BMI of 46.7 now making diet and lifestyle changes while preparing for weight loss surgery.              Intervention:  Nutrition counseling for upcoming Bariatric Surgery.   Reviewed guidelines for choosing protein shakes for post-op intake.    Discussed portions of starchy foods and encouraged gradual reduction in preparation for surgery.   Teaching Method Utilized:  Visual Auditory Hands on  Handouts given during visit include:  Goals and instructions   Barriers to learning/adherence to lifestyle change: none  Demonstrated degree of understanding via:  Teach Back    Monitoring/Evaluation:  Dietary intake, exercise, and body weight pre-op class on 11/27/17.

## 2017-11-16 ENCOUNTER — Ambulatory Visit (INDEPENDENT_AMBULATORY_CARE_PROVIDER_SITE_OTHER): Payer: Medicare HMO | Admitting: Family Medicine

## 2017-11-16 ENCOUNTER — Encounter: Payer: Self-pay | Admitting: Family Medicine

## 2017-11-16 VITALS — BP 116/80 | HR 69 | Temp 97.4°F | Ht 71.0 in | Wt 337.4 lb

## 2017-11-16 DIAGNOSIS — M170 Bilateral primary osteoarthritis of knee: Secondary | ICD-10-CM | POA: Diagnosis not present

## 2017-11-16 DIAGNOSIS — I481 Persistent atrial fibrillation: Secondary | ICD-10-CM | POA: Diagnosis not present

## 2017-11-16 DIAGNOSIS — I4819 Other persistent atrial fibrillation: Secondary | ICD-10-CM

## 2017-11-16 DIAGNOSIS — I10 Essential (primary) hypertension: Secondary | ICD-10-CM | POA: Diagnosis not present

## 2017-11-16 DIAGNOSIS — R7303 Prediabetes: Secondary | ICD-10-CM | POA: Diagnosis not present

## 2017-11-16 DIAGNOSIS — G629 Polyneuropathy, unspecified: Secondary | ICD-10-CM | POA: Diagnosis not present

## 2017-11-16 DIAGNOSIS — E559 Vitamin D deficiency, unspecified: Secondary | ICD-10-CM | POA: Diagnosis not present

## 2017-11-16 LAB — COMPREHENSIVE METABOLIC PANEL
ALT: 18 U/L (ref 0–53)
AST: 18 U/L (ref 0–37)
Albumin: 4.3 g/dL (ref 3.5–5.2)
Alkaline Phosphatase: 45 U/L (ref 39–117)
BUN: 19 mg/dL (ref 6–23)
CO2: 25 mEq/L (ref 19–32)
Calcium: 9.9 mg/dL (ref 8.4–10.5)
Chloride: 101 mEq/L (ref 96–112)
Creatinine, Ser: 0.98 mg/dL (ref 0.40–1.50)
GFR: 82.69 mL/min (ref >60.00)
Glucose, Bld: 106 mg/dL — ABNORMAL HIGH (ref 70–99)
Potassium: 4.4 mEq/L (ref 3.5–5.1)
Sodium: 137 mEq/L (ref 135–145)
Total Bilirubin: 0.8 mg/dL (ref 0.2–1.2)
Total Protein: 7.2 g/dL (ref 6.0–8.3)

## 2017-11-16 LAB — TSH: TSH: 2.07 u[IU]/mL (ref 0.35–4.50)

## 2017-11-16 LAB — VITAMIN B12: Vitamin B-12: 527 pg/mL (ref 211–911)

## 2017-11-16 LAB — VITAMIN D 25 HYDROXY (VIT D DEFICIENCY, FRACTURES): VITD: 22.14 ng/mL — ABNORMAL LOW (ref 30.00–100.00)

## 2017-11-16 LAB — HEMOGLOBIN A1C: Hgb A1c MFr Bld: 5.4 % (ref 4.6–6.5)

## 2017-11-16 NOTE — Assessment & Plan Note (Signed)
Check A1c. 

## 2017-11-16 NOTE — Progress Notes (Signed)
  [name], MD Phone: [phone]  Carlos Crosby is a 62 y.o. male who presents today for f/u.  CC: htn, neuropathy, knee pain, morbid obesity  HYPERTENSION  Disease Monitoring  Home BP Monitoring not checking Chest pain- no    Dyspnea- no Medications  Compliance-  Taking lisinopril, metoprolol, verapamil.  No palpitations.  Patient feels like he has neuropathy in his bilateral toes and fingers at times.  Notes there is a weird sensation.  Occasionally burning and tingling.  Describes it as feeling like he has been out in the snow and then come in and run his hands under hot water.  He also feels cold a lot.  Patient has chronic bilateral knee pain.  That has been getting better with some weight loss.  The tramadol works fairly well for this.  No drowsiness.  No alcohol use.  It is quite beneficial.  He typically does try Tylenol first.  Morbid obesity: He is in the process of getting approved for bariatric surgery.  He has been going and doing water aerobics 3-4 times a week.  He is seeing a nutritionist.  He sees a psychologist next week.  He is down 8 to 9 pounds.  He is eating lots of vegetables now.    Social History   Tobacco Use  Smoking Status Never Smoker  Smokeless Tobacco Never Used     ROS see history of present illness  Objective  Physical Exam Vitals:   11/16/17 0755  BP: 116/80  Pulse: 69  Temp: (!) 97.4 F (36.3 C)  SpO2: 99%    BP Readings from Last 3 Encounters:  11/16/17 116/80  10/25/17 111/77  10/25/17 (!) 139/96   Wt Readings from Last 3 Encounters:  11/16/17 (!) 337 lb 6.4 oz (153 kg)  11/06/17 (!) 334 lb 14.4 oz (151.9 kg)  10/25/17 (!) 340 lb 6.4 oz (154.4 kg)    Physical Exam  Constitutional: No distress.  HENT:  Mouth/Throat: Oropharynx is clear and moist.  Eyes: Pupils are equal, round, and reactive to light. Conjunctivae are normal.  Cardiovascular: Normal rate and normal heart sounds. An irregularly irregular rhythm  present.  Pulmonary/Chest: Effort normal and breath sounds normal.  Musculoskeletal: He exhibits no edema.  Neurological: He is alert.  CN 2-12 intact, 5/5 strength in bilateral biceps, triceps, grip, quads, hamstrings, plantar and dorsiflexion, sensation to light touch slightly decreased in bilateral plantar surface toes, otherwise sensation to light touch intact in bilateral UE and LE  Skin: Skin is warm and dry. He is not diaphoretic.     Assessment/Plan: Please see individual problem list.  Essential hypertension Well-controlled.  Continue current regimen.  Atrial fibrillation ([place]) Rate controlled.  Asymptomatic.  He will continue to follow with cardiology.  Osteoarthritis of both knees Knee pain has been improving with weight loss.  He will continue tramadol.  He will lose weight with bariatric surgery and continued diet and exercise.  Morbid obesity He will continue to see the surgeon for this.  Neuropathy Symptoms concerning for neuropathy.  He will have lab work today to evaluate for cause.  Vitamin D deficiency Recheck vitamin D.  Prediabetes Check A1c.   Orders Placed This Encounter  Procedures  . Comp Met (CMET)  . B12  . Vitamin D (25 hydroxy)  . HgB A1c  . TSH    No orders of the defined types were placed in this encounter.    [name], MD [place]

## 2017-11-16 NOTE — Assessment & Plan Note (Signed)
Rate controlled.  Asymptomatic.  He will continue to follow with cardiology.

## 2017-11-16 NOTE — Assessment & Plan Note (Signed)
He will continue to see the surgeon for this.

## 2017-11-16 NOTE — Assessment & Plan Note (Signed)
Recheck vitamin D

## 2017-11-16 NOTE — Assessment & Plan Note (Signed)
Well-controlled.  Continue current regimen. 

## 2017-11-16 NOTE — Patient Instructions (Signed)
Nice to see you. We will check lab work for evaluation of your neuropathy. Please continue with your weight loss surgeon.

## 2017-11-16 NOTE — Assessment & Plan Note (Signed)
Knee pain has been improving with weight loss.  He will continue tramadol.  He will lose weight with bariatric surgery and continued diet and exercise.

## 2017-11-16 NOTE — Assessment & Plan Note (Signed)
Symptoms concerning for neuropathy.  He will have lab work today to evaluate for cause.

## 2017-11-17 ENCOUNTER — Other Ambulatory Visit: Payer: Self-pay | Admitting: Family Medicine

## 2017-11-17 DIAGNOSIS — G629 Polyneuropathy, unspecified: Secondary | ICD-10-CM

## 2017-11-21 ENCOUNTER — Other Ambulatory Visit: Payer: Self-pay | Admitting: Family Medicine

## 2017-11-21 ENCOUNTER — Encounter: Payer: Self-pay | Admitting: Family Medicine

## 2017-11-21 ENCOUNTER — Ambulatory Visit (INDEPENDENT_AMBULATORY_CARE_PROVIDER_SITE_OTHER): Payer: Medicare HMO | Admitting: Psychiatry

## 2017-11-21 DIAGNOSIS — F509 Eating disorder, unspecified: Secondary | ICD-10-CM | POA: Diagnosis not present

## 2017-11-21 NOTE — Telephone Encounter (Signed)
Last OV 11/16/17 last filled 08/15/17 90 1rf

## 2017-11-22 NOTE — Telephone Encounter (Signed)
Last OV 11/16/17 last filled 08/15/17 90 1rf

## 2017-11-22 NOTE — Telephone Encounter (Signed)
Sent to pharmacy.  Drug database reviewed. 

## 2017-11-23 ENCOUNTER — Telehealth: Payer: Self-pay | Admitting: Dietician

## 2017-11-23 NOTE — Telephone Encounter (Signed)
Received message from patient stating that he might need to reschedule his pre-op class, currently scheduled on 11/27/17. Left a message for him with next available class date here, as well as the option of attending class at [facility]. Requested a call back.

## 2017-11-27 ENCOUNTER — Ambulatory Visit: Payer: Self-pay

## 2017-11-28 ENCOUNTER — Inpatient Hospital Stay: Payer: Medicare HMO | Attending: Oncology

## 2017-11-28 ENCOUNTER — Inpatient Hospital Stay: Payer: Medicare HMO

## 2017-11-28 LAB — CBC WITH DIFFERENTIAL/PLATELET
Basophils Absolute: 0.1 10*3/uL (ref 0–0.1)
Basophils Relative: 1 %
Eosinophils Absolute: 0.2 10*3/uL (ref 0–0.7)
Eosinophils Relative: 3 %
HCT: 45.4 % (ref 40.0–52.0)
Hemoglobin: 16.1 g/dL (ref 13.0–18.0)
Lymphocytes Relative: 35 %
Lymphs Abs: 2.4 10*3/uL (ref 1.0–3.6)
MCH: 34.4 pg — ABNORMAL HIGH (ref 26.0–34.0)
MCHC: 35.4 g/dL (ref 32.0–36.0)
MCV: 97.2 fL (ref 80.0–100.0)
Monocytes Absolute: 0.6 10*3/uL (ref 0.2–1.0)
Monocytes Relative: 9 %
Neutro Abs: 3.7 10*3/uL (ref 1.4–6.5)
Neutrophils Relative %: 52 %
Platelets: 210 10*3/uL (ref 150–440)
RBC: 4.67 MIL/uL (ref 4.40–5.90)
RDW: 12.8 % (ref 11.5–14.5)
WBC: 7 10*3/uL (ref 3.8–10.6)

## 2017-11-28 LAB — IRON AND TIBC
Iron: 102 ug/dL (ref 45–182)
Saturation Ratios: 33 % (ref 17.9–39.5)
TIBC: 307 ug/dL (ref 250–450)
UIBC: 205 ug/dL

## 2017-11-28 LAB — FERRITIN: Ferritin: 238 ng/mL (ref 24–336)

## 2017-11-28 NOTE — Progress Notes (Signed)
Per Tillie Rung RN per Dr. Grayland Ormond okay to proceed with therapeutic phlebotomy prior to Ferritin results. 400cc to be removed.  1434: Therapeutic Phlebotomy performed removing 400cc per MD order. Starting at 1415 and ending at 1434 using a 20 gauge PIV in right hand. Pt tolerated procedure well, declined to stay 30 minute post observation period. Pt and VS stable at discharge.

## 2017-12-14 ENCOUNTER — Ambulatory Visit (INDEPENDENT_AMBULATORY_CARE_PROVIDER_SITE_OTHER): Payer: Medicare HMO | Admitting: Psychiatry

## 2017-12-14 DIAGNOSIS — F509 Eating disorder, unspecified: Secondary | ICD-10-CM

## 2017-12-18 ENCOUNTER — Other Ambulatory Visit: Payer: Self-pay | Admitting: Family Medicine

## 2017-12-19 NOTE — Telephone Encounter (Signed)
Last OV 11/16/17 last filled 11/22/17 90 0rf

## 2017-12-21 NOTE — Telephone Encounter (Signed)
Sent to pharmacy.  Drug database reviewed. 

## 2017-12-22 ENCOUNTER — Encounter: Payer: Medicare HMO | Attending: Surgery

## 2017-12-22 ENCOUNTER — Ambulatory Visit: Payer: Self-pay

## 2017-12-22 VITALS — Wt 343.3 lb

## 2017-12-22 DIAGNOSIS — Z713 Dietary counseling and surveillance: Secondary | ICD-10-CM | POA: Diagnosis not present

## 2017-12-22 DIAGNOSIS — Z6841 Body Mass Index (BMI) 40.0 and over, adult: Secondary | ICD-10-CM

## 2017-12-22 NOTE — Progress Notes (Signed)
Pre-Operative Nutrition Class:  Appt start time: 9000   End time:  1000.  Patient was seen on 12/22/17 for Pre-Operative Bariatric Surgery Education [facility].   Surgery date: TBD Surgery type: Sleeve Gastrectomy [facility] weight at [facility]: 342.8# Weight today: 343.3#   Samples given per MNT protocol. Patient educated on appropriate usage: Bariatric Advantage Multivitamin Lot # not listed on packaging Exp: not listed on packaging  Bariatric Advantage Calcium Citrate Lot # not listed on packaging Exp: not listed on packaging   Celebrate Vitamins Multivitamin Lot # [id number] Exp: 07/2018  Celebrate Vitamins Calcium Citrate Lot # [id number] Exp: 10/2018  Premier Protein Shake Lot # [id number] Exp: 09/04/2018  Premier Protein Clear Lot # [id number] Exp: 05/24/2018  The following the learning objectives were met by the patient during this course:  Identify Pre-Op Dietary Goals and will begin 2 weeks pre-operatively  Identify appropriate sources of fluids and proteins   State protein recommendations and appropriate sources pre and post-operatively  Identify Post-Operative Dietary Goals and will follow for 2 weeks post-operatively  Identify appropriate multivitamin and calcium sources  Describe the need for physical activity post-operatively and will follow MD recommendations  State when to call healthcare provider regarding medication questions or post-operative complications  Handouts given during class include:  Pre-Op Bariatric Surgery Diet Handout  Protein Shake Handout  Post-Op Bariatric Surgery Nutrition Handout  [facility] Program Information Flyer  Support Group Information Flyer  [facility] Bariatric Supplements Price List  Follow-Up Plan: Patient will follow-up at [facility] 2 weeks post operatively for diet advancement per MD.

## 2017-12-25 ENCOUNTER — Inpatient Hospital Stay: Payer: Medicare HMO | Attending: Oncology

## 2017-12-25 ENCOUNTER — Inpatient Hospital Stay: Payer: Medicare HMO

## 2017-12-25 LAB — CBC WITH DIFFERENTIAL/PLATELET
Basophils Absolute: 0 10*3/uL (ref 0–0.1)
Basophils Relative: 1 %
Eosinophils Absolute: 0.2 10*3/uL (ref 0–0.7)
Eosinophils Relative: 2 %
HCT: 45.2 % (ref 40.0–52.0)
Hemoglobin: 15.7 g/dL (ref 13.0–18.0)
Lymphocytes Relative: 35 %
Lymphs Abs: 2.3 10*3/uL (ref 1.0–3.6)
MCH: 34.1 pg — ABNORMAL HIGH (ref 26.0–34.0)
MCHC: 34.8 g/dL (ref 32.0–36.0)
MCV: 98 fL (ref 80.0–100.0)
Monocytes Absolute: 0.6 10*3/uL (ref 0.2–1.0)
Monocytes Relative: 9 %
Neutro Abs: 3.5 10*3/uL (ref 1.4–6.5)
Neutrophils Relative %: 53 %
Platelets: 202 10*3/uL (ref 150–440)
RBC: 4.61 MIL/uL (ref 4.40–5.90)
RDW: 13.8 % (ref 11.5–14.5)
WBC: 6.6 10*3/uL (ref 3.8–10.6)

## 2017-12-25 LAB — IRON AND TIBC
Iron: 91 ug/dL (ref 45–182)
Saturation Ratios: 30 % (ref 17.9–39.5)
TIBC: 308 ug/dL (ref 250–450)
UIBC: 217 ug/dL

## 2017-12-25 LAB — FERRITIN: Ferritin: 233 ng/mL (ref 24–336)

## 2017-12-25 NOTE — Progress Notes (Signed)
Per Tillie Rung RN per Dr. Grayland Ormond proceed with phlebotomy piror to [facility] results.   Therapeutic phlebotomy performed removing 400 mls blood per Md order using a 20 gauge PIV in let AC starting at 1329 and ending at 1342. Pt tolerated procedure well, pt declined to stay 30 minute post observation. Pt and VS stable at discharge.

## 2018-01-05 DIAGNOSIS — G4730 Sleep apnea, unspecified: Secondary | ICD-10-CM | POA: Diagnosis not present

## 2018-01-05 DIAGNOSIS — G4733 Obstructive sleep apnea (adult) (pediatric): Secondary | ICD-10-CM | POA: Diagnosis not present

## 2018-01-12 ENCOUNTER — Telehealth: Payer: Self-pay | Admitting: Radiology

## 2018-01-12 ENCOUNTER — Encounter: Payer: Self-pay | Admitting: Oncology

## 2018-01-12 DIAGNOSIS — E559 Vitamin D deficiency, unspecified: Secondary | ICD-10-CM

## 2018-01-12 NOTE — Telephone Encounter (Signed)
Pt coming in for labs Monday, please place future orders. Thank you 

## 2018-01-13 NOTE — Addendum Note (Signed)
Addended by: Caryl Bis Adair Lauderback G on: 01/13/2018 01:27 PM   Modules accepted: Orders

## 2018-01-15 ENCOUNTER — Other Ambulatory Visit (INDEPENDENT_AMBULATORY_CARE_PROVIDER_SITE_OTHER): Payer: Medicare HMO

## 2018-01-15 DIAGNOSIS — E559 Vitamin D deficiency, unspecified: Secondary | ICD-10-CM

## 2018-01-15 LAB — VITAMIN D 25 HYDROXY (VIT D DEFICIENCY, FRACTURES): VITD: 32.32 ng/mL (ref 30.00–100.00)

## 2018-01-22 ENCOUNTER — Encounter: Payer: Self-pay | Admitting: Oncology

## 2018-01-28 NOTE — Progress Notes (Signed)
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  INTERVAL HISTORY: Patient returns to clinic today for repeat laboratory work, further evaluation, and consideration of additional phlebotomy.  He is preparing for gastric bypass surgery next month.  He currently feels well and is asymptomatic. He has no neurologic complaints.  He denies any recent fevers or illnesses.  He has a good appetite and denies weight loss.  He has no chest pain or shortness of breath.  He denies any nausea, vomiting, constipation, or diarrhea.  He has no urinary complaints.  Patient offers no specific complaints today.  REVIEW OF SYSTEMS:   Review of Systems  Constitutional: Negative.  Negative for fever, malaise/fatigue and weight loss.  Respiratory: Negative.  Negative for cough, hemoptysis and shortness of breath.   Cardiovascular: Negative.  Negative for chest pain and leg swelling.  Gastrointestinal: Negative.  Negative for abdominal pain, blood in stool and melena.  Genitourinary: Negative.  Negative for hematuria.  Musculoskeletal: Negative.  Negative for back pain.  Skin: Negative.  Negative for rash.  Neurological: Negative.  Negative for sensory change, focal weakness and weakness.  Psychiatric/Behavioral: Negative.  The patient is not nervous/anxious.     As per HPI. Otherwise, a complete review of systems is negative.  PAST MEDICAL HISTORY: Past Medical History:  Diagnosis Date  . Chronic a-fib ([place])   . Chronic systolic CHF (congestive heart failure) ([place])   . Depression   . Hemochromatosis   . History of kidney stones   . History of pulmonary embolism   . Hyperlipidemia   . Hypertension   . Migraine   . Obesity   . OSA (obstructive  sleep apnea)    on CPAP  . Osteoarthritis    bilateral knees  . Rocky Mountain spotted fever 2011    PAST SURGICAL HISTORY: Past Surgical History:  Procedure Laterality Date  . CARDIAC CATHETERIZATION  2012   [facility]  . KNEE SURGERY     right knee   . LITHOTRIPSY  07/31/2014  . URETERAL STENT PLACEMENT  2016  . URETEROSCOPY      FAMILY HISTORY: Family History  Problem Relation Age of Onset  . Thyroid disease Mother   . Cancer Father        lung  . Arthritis Father   . Hypertension Father   . Cancer Paternal Grandfather        Throat cancer    ADVANCED DIRECTIVES (Y/N):  N  HEALTH MAINTENANCE: Social History   Tobacco Use  . Smoking status: Never Smoker  . Smokeless tobacco: Never Used  Substance Use Topics  . Alcohol use: No    Frequency: Never    Comment: occasional; 3 times a year  . Drug use: No     Colonoscopy:  PAP:  Bone density:  Lipid panel:  Allergies  Allergen Reactions  . Allopurinol Hives and Swelling  . Other Hives  . Diltiazem Itching and Rash  . Penicillin G Nausea Only    And dirrhea  . Ondansetron Rash  . Ondansetron Hcl Rash  . Penicillins Other (See Comments) and Nausea Only    Pt unsure of rxn; showed up on allergy test Pt unsure of rxn; showed up on allergy test Pt unsure of rxn; showed up on allergy test And  dirrhea     Current Outpatient Medications  Medication Sig Dispense Refill  . acetaminophen (TYLENOL) 500 MG tablet Take 1,000 mg by mouth every 6 (six) hours as needed.    Marland Kitchen apixaban (ELIQUIS) 5 MG TABS tablet Take 1 tablet (5 mg total) by mouth 2 (two) times daily. 180 tablet 3  . Cholecalciferol (VITAMIN D) 2000 units CAPS     . digoxin (LANOXIN) 0.25 MG tablet Take one tablet (0.25 mg) by mouth once daily six days a week 30 tablet 1  . furosemide (LASIX) 40 MG tablet Take 40 mg by mouth daily as needed.     Marland Kitchen lisinopril (PRINIVIL,ZESTRIL) 2.5 MG tablet Take 1 tablet (2.5 mg total) by mouth daily. 90 tablet 1  .  loratadine (CLARITIN) 10 MG tablet Take 10 mg by mouth daily.    . metoprolol succinate (TOPROL-XL) 100 MG 24 hr tablet TAKE 2 TABLETS EVERY MORNING  AND TAKE 1 TABLET EVERY EVENING 270 tablet 3  . Multiple Vitamins-Minerals (CENTRUM SILVER ADULT 50+) TABS Take 1 tablet by mouth daily.    . Potassium 95 MG TABS Take 1 tablet by mouth.    . pravastatin (PRAVACHOL) 80 MG tablet Take 1 tablet (80 mg total) by mouth every evening. 90 tablet 3  . traMADol (ULTRAM) 50 MG tablet TAKE 1 TABLET BY MOUTH EVERY 6 HOURS AS NEEDED 90 tablet 1  . verapamil (CALAN-SR) 120 MG CR tablet Take 1 tablet (120 mg total) by mouth daily. 90 tablet 3   No current facility-administered medications for this visit.     OBJECTIVE: Vitals:   01/30/18 1412  BP: 129/77  Pulse: (!) 58  Resp: (!) 22  Temp: 97.7 F (36.5 C)     Body mass index is 48.26 kg/m.    ECOG FS:0 - Asymptomatic  General: Well-developed, well-nourished, no acute distress. Eyes: Pink conjunctiva, anicteric sclera. HEENT: Normocephalic, moist mucous membranes. Lungs: Clear to auscultation bilaterally. Heart: Regular rate and rhythm. No rubs, murmurs, or gallops. Abdomen: Soft, nontender, nondistended. No organomegaly noted, normoactive bowel sounds. Musculoskeletal: No edema, cyanosis, or clubbing. Neuro: Alert, answering all questions appropriately. Cranial nerves grossly intact. Skin: No rashes or petechiae noted. Psych: Normal affect.  LAB RESULTS:  Lab Results  Component Value Date   NA 137 11/16/2017   K 4.4 11/16/2017   CL 101 11/16/2017   CO2 25 11/16/2017   GLUCOSE 106 (H) 11/16/2017   BUN 19 11/16/2017   CREATININE 0.98 11/16/2017   CALCIUM 9.9 11/16/2017   PROT 7.2 11/16/2017   ALBUMIN 4.3 11/16/2017   AST 18 11/16/2017   ALT 18 11/16/2017   ALKPHOS 45 11/16/2017   BILITOT 0.8 11/16/2017   GFRNONAA >60 03/30/2017   GFRAA >60 03/30/2017    Lab Results  Component Value Date   WBC 7.7 01/30/2018   NEUTROABS 4.0  01/30/2018   HGB 15.7 01/30/2018   HCT 45.7 01/30/2018   MCV 98.7 01/30/2018   PLT 207 01/30/2018   Lab Results  Component Value Date   IRON 104 01/30/2018   TIBC 323 01/30/2018   IRONPCTSAT 32 01/30/2018   Lab Results  Component Value Date   FERRITIN 188 01/30/2018     STUDIES: No results found.  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  Goal ferritin will be between 50 and 100.  Proceed with 400  mL phlebotomy today.  Because patient is on Eliquis, he cannot receive his phlebotomy through [facility].  Patient is having his gastric bypass surgery next month, therefore return to clinic in 2 months for further evaluation and consideration of additional phlebotomy.  I spent a total of 30 minutes face-to-face with the patient of which greater than 50% of the visit was spent in counseling and coordination of care as detailed above.   Patient expressed understanding and was in agreement with this plan. He also understands that He can call clinic at any time with any questions, concerns, or complaints.    Lloyd Huger, MD   02/03/2018 7:06 AM

## 2018-01-30 ENCOUNTER — Inpatient Hospital Stay: Payer: Medicare HMO

## 2018-01-30 ENCOUNTER — Inpatient Hospital Stay: Payer: Medicare HMO | Attending: Oncology | Admitting: Oncology

## 2018-01-30 DIAGNOSIS — Z7901 Long term (current) use of anticoagulants: Secondary | ICD-10-CM

## 2018-01-30 LAB — CBC WITH DIFFERENTIAL/PLATELET
Basophils Absolute: 0.1 10*3/uL (ref 0–0.1)
Basophils Relative: 1 %
Eosinophils Absolute: 0.2 10*3/uL (ref 0–0.7)
Eosinophils Relative: 2 %
HCT: 45.7 % (ref 40.0–52.0)
Hemoglobin: 15.7 g/dL (ref 13.0–18.0)
Lymphocytes Relative: 35 %
Lymphs Abs: 2.7 10*3/uL (ref 1.0–3.6)
MCH: 33.9 pg (ref 26.0–34.0)
MCHC: 34.3 g/dL (ref 32.0–36.0)
MCV: 98.7 fL (ref 80.0–100.0)
Monocytes Absolute: 0.7 10*3/uL (ref 0.2–1.0)
Monocytes Relative: 10 %
Neutro Abs: 4 10*3/uL (ref 1.4–6.5)
Neutrophils Relative %: 52 %
Platelets: 207 10*3/uL (ref 150–440)
RBC: 4.63 MIL/uL (ref 4.40–5.90)
RDW: 13.4 % (ref 11.5–14.5)
WBC: 7.7 10*3/uL (ref 3.8–10.6)

## 2018-01-30 LAB — IRON AND TIBC
Iron: 104 ug/dL (ref 45–182)
Saturation Ratios: 32 % (ref 17.9–39.5)
TIBC: 323 ug/dL (ref 250–450)
UIBC: 219 ug/dL

## 2018-01-30 LAB — FERRITIN: Ferritin: 188 ng/mL (ref 24–336)

## 2018-02-02 ENCOUNTER — Other Ambulatory Visit (HOSPITAL_COMMUNITY): Payer: Self-pay | Admitting: Surgery

## 2018-02-08 ENCOUNTER — Ambulatory Visit (HOSPITAL_COMMUNITY)
Admission: RE | Admit: 2018-02-08 | Discharge: 2018-02-08 | Disposition: A | Discharge status: Home / Self Care | Payer: Medicare HMO | Source: Ambulatory Visit | Attending: Surgery | Admitting: Surgery

## 2018-02-08 DIAGNOSIS — Z6841 Body Mass Index (BMI) 40.0 and over, adult: Secondary | ICD-10-CM | POA: Diagnosis not present

## 2018-02-08 DIAGNOSIS — N200 Calculus of kidney: Secondary | ICD-10-CM | POA: Insufficient documentation

## 2018-02-11 NOTE — Progress Notes (Signed)
Cardiology Office Note  Date:  02/12/2018   ID:  Carlos Crosby, DOB 02-02-1956, MRN [id number]  PCP:  Leone Haven, MD   Chief Complaint  Patient presents with  . other    6 mo f/u and surgical clearance. Medications verbally reviewed. "doing well"     HPI:  Carlos Crosby is a 62 year old gentleman with  morbid obesity,  chronic atrial fibrillation (previously on tikosyn),  chronic systolic CHF with ejection fraction 25% in 2012,  Improved up to 45% - 50% cardiac cath: no CAD,   obstructive sleep apnea on CPAP,  hypertension,  Hyperlipidemia,  recurrent renal stones,  osteoarthritis of the knees bilaterally, Previously noted to have elevated ventricular rate in the setting of anxiety, such as before procedures  depression  who presents for routine follow-up of his atrial fibrillation   Planning for gastric sleeve in a few weeks Dr. 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 Phlebotomy monthly  Rare lasix, denies any significant shortness of breath or leg swelling  problems with his severe joint pain, mobility  on eliquis Requesting samples  Echo reviewed with him in detail on today's visit EF 50 to 55%, stable function  EKG personally reviewed by myself on todays visit Shows atrial fibrillation ventricular rate 79 bpm nonspecific ST abnormality, rare PVC  Other past medical history reviewed ECHO 11/24/2015: EF 45 to 50% Stress test : 12/21/2015 No ischemia, EF 42% echo in 05/2015: EF 30 to 35%  Labs: LDL 65, total 147  outpatient left lithotripsy 08/28/2014  found to have atrial  fibrillation with RVR with heart rates up to 170s prior to the procedure and this was canceled. He was kept overnight in the hospital and heart rate returned to normal on his regular medication regiment, metoprolol succinate 100 mg twice a day  As an outpatient, he went repeat lithotripsy with Dr. Eliberto Ivory several weeks ago and was given Xanax the night before, morning of the procedure and extra metoprolol succinate 50 mg the night before is the morning of the procedure. He reports that his heart rate was well-controlled, Dr. Eliberto Ivory attempted the lithotripsy but was unsuccessful Lithotripsy repeated at a later date Recent removal of stent in the left urethra  PMH:   has a past medical history of Chronic a-fib ([place]), Chronic systolic CHF (congestive heart failure) ([place]), Depression, Hemochromatosis, History of kidney stones, History of pulmonary embolism, Hyperlipidemia, Hypertension, Migraine, Obesity, OSA (obstructive sleep apnea), Osteoarthritis, and Rocky Mountain spotted fever (2011).  PSH:    Past Surgical History:  Procedure Laterality Date  . CARDIAC CATHETERIZATION  2012   [facility]  . KNEE SURGERY     right knee   . LITHOTRIPSY  07/31/2014  . URETERAL STENT PLACEMENT  2016  . URETEROSCOPY      Current Outpatient Medications  Medication Sig Dispense Refill  . acetaminophen (TYLENOL) 500 MG tablet Take 1,000 mg by mouth every 6 (six) hours as needed.    Marland Kitchen apixaban (ELIQUIS) 5 MG TABS tablet Take 1 tablet (5 mg total) by mouth 2 (two) times daily. 180 tablet 3  . Cholecalciferol (VITAMIN D) 2000 units CAPS     . digoxin (LANOXIN) 0.25 MG  tablet Take one tablet (0.25 mg) by mouth once daily six days a week 30 tablet 1  . furosemide (LASIX) 40 MG tablet Take 40 mg by mouth daily as needed.     Marland Kitchen lisinopril (PRINIVIL,ZESTRIL) 2.5 MG tablet Take 1 tablet (2.5 mg total) by mouth daily. 90 tablet 1  . loratadine (CLARITIN) 10 MG tablet Take 10 mg by mouth daily.    . metoprolol succinate  (TOPROL-XL) 100 MG 24 hr tablet TAKE 2 TABLETS EVERY MORNING  AND TAKE 1 TABLET EVERY EVENING 270 tablet 3  . Multiple Vitamins-Minerals (CENTRUM SILVER ADULT 50+) TABS Take 1 tablet by mouth daily.    . Potassium 95 MG TABS Take 1 tablet by mouth.    . pravastatin (PRAVACHOL) 80 MG tablet Take 1 tablet (80 mg total) by mouth every evening. 90 tablet 3  . traMADol (ULTRAM) 50 MG tablet TAKE 1 TABLET BY MOUTH EVERY 6 HOURS AS NEEDED 90 tablet 1  . verapamil (CALAN-SR) 120 MG CR tablet Take 1 tablet (120 mg total) by mouth daily. 90 tablet 3   No current facility-administered medications for this visit.      Allergies:   Allopurinol; Other; Diltiazem; Penicillin g; Ondansetron; Ondansetron hcl; and Penicillins   Social History:  The patient  reports that he has never smoked. He has never used smokeless tobacco. He reports that he does not drink alcohol or use drugs.   Family History:   family history includes Arthritis in his father; Cancer in his father and paternal grandfather; Hypertension in his father; Thyroid disease in his mother.    Review of Systems: Review of Systems  Constitutional: Negative.   Respiratory: Negative.   Cardiovascular: Negative.   Gastrointestinal: Negative.   Musculoskeletal: Positive for joint pain.  Neurological: Negative.   Psychiatric/Behavioral: The patient is nervous/anxious.   All other systems reviewed and are negative.    PHYSICAL EXAM: VS:  BP 138/84 (BP Location: Left Arm, Patient Position: Sitting, Cuff Size: Large)   Pulse 79   Ht 5\' 11"  (1.803 m)   Wt (!) 321 lb 4 oz (145.7 kg)   BMI 44.81 kg/m  , BMI Body mass index is 44.81 kg/m. Constitutional:  oriented to person, place, and time. No distress.  HENT:  Head: Normocephalic and atraumatic.  Eyes:  no discharge. No scleral icterus.  Neck: Normal range of motion. Neck supple. No JVD present.  Cardiovascular: Normal rate, regular rhythm, normal heart sounds and intact distal pulses.  Exam reveals no gallop and no friction rub. No edema No murmur heard. Pulmonary/Chest: Effort normal and breath sounds normal. No stridor. No respiratory distress.  no wheezes.  no rales.  no tenderness.  Abdominal: Soft.  no distension.  no tenderness.  Musculoskeletal: Normal range of motion.  no  tenderness or deformity.  Neurological:  normal muscle tone. Coordination normal. No atrophy Skin: Skin is warm and dry. No rash noted. not diaphoretic.  Psychiatric:  normal mood and affect. behavior is normal. Thought content normal.    Recent Labs: 11/16/2017: ALT 18; BUN 19; Creatinine, Ser 0.98; Potassium 4.4; Sodium 137; TSH 2.07 01/30/2018: Hemoglobin 15.7; Platelets 207    Lipid Panel Lab Results  Component Value Date   CHOL 119 06/22/2017   HDL 33.50 (L) 06/22/2017   TRIG 210.0 (H) 06/22/2017      Wt Readings from Last 3 Encounters:  02/12/18 (!) 321 lb 4 oz (145.7 kg)  01/30/18 (!) 346 lb (156.9 kg)  12/22/17 (!) 343 lb 4.8  oz (155.7 kg)       ASSESSMENT AND PLAN:  Preop cardiovascular Acceptable risk for procedure He will need Xanax night before and morning of the procedure given severe anxiety that causes tachycardia Previously done well with that regimen Would take his two metoprolol tablets in the morning of the procedure with the Xanax  Permanent atrial fibrillation ([place]) Rate relatively well controlled, samples given today of eliquis Did not qualify for company assistance He is tolerating verapamil and metoprolol  Essential hypertension Blood pressure is well controlled on today's visit. No changes made to the medications. Stable  Pure hypercholesterolemia Cholesterol is at goal on the current lipid regimen. No changes to the medications were made. Stable  Morbid obesity ([place]) He is going ahead with gastric bypass surgery,sleeve Acceptable risk from cardiac perspective Close to normal ejection fraction He will benefit from weight loss surgery in terms  of cardiac risk factors, chronic pain, hypertension, hyperlipidemia  Anxiety Needs Xanax night before and morning of the procedure We'll discuss with primary care  Obstructive sleep apnea on CPAP  compliant on his CPAP  Chronic diastolic congestive heart failure (HCC) Lasix only as needed   Total encounter time more than 25 minutes  Greater than 50% was spent in counseling and coordination of care with the patient   Disposition:   F/U  12 months    Signed, Esmond Plants, M.D., Ph.D. 02/12/2018  [place], [place]

## 2018-02-12 ENCOUNTER — Other Ambulatory Visit: Payer: Self-pay | Admitting: Family Medicine

## 2018-02-12 ENCOUNTER — Encounter: Payer: Self-pay | Admitting: Cardiovascular Disease

## 2018-02-12 ENCOUNTER — Ambulatory Visit (INDEPENDENT_AMBULATORY_CARE_PROVIDER_SITE_OTHER): Payer: Medicare HMO | Admitting: Cardiovascular Disease

## 2018-02-12 VITALS — BP 138/84 | HR 79 | Ht 71.0 in | Wt 321.2 lb

## 2018-02-12 DIAGNOSIS — I429 Cardiomyopathy, unspecified: Secondary | ICD-10-CM

## 2018-02-12 DIAGNOSIS — I10 Essential (primary) hypertension: Secondary | ICD-10-CM

## 2018-02-12 DIAGNOSIS — I481 Persistent atrial fibrillation: Secondary | ICD-10-CM | POA: Diagnosis not present

## 2018-02-12 DIAGNOSIS — I4819 Other persistent atrial fibrillation: Secondary | ICD-10-CM

## 2018-02-12 NOTE — Telephone Encounter (Signed)
Okay to refill? Last written on 12/21/17 for #90 with one refill.  LOV: 11/16/17 NOV: 05/18/18

## 2018-02-12 NOTE — Patient Instructions (Addendum)
Medication Instructions:   No medication changes made  Take xanax night before before and morning of the procedure Medication Samples have been provided to the patient.  Drug name: Eliquis       Strength: 5 mg        Qty: 2 boxes  LOT: [id number]  Exp.Date: 11/21  Labwork:  No new labs needed  Testing/Procedures:  No further testing at this time   Follow-Up: It was a pleasure seeing you in the office today. Please call us if you have new issues that need to be addressed before your next appt.  [phone]  Your physician wants you to follow-up in: 12 months.  You will receive a reminder letter in the mail two months in advance. If you don't receive a letter, please call our office to schedule the follow-up appointment.  If you need a refill on your cardiac medications before your next appointment, please call your pharmacy.  For educational health videos Log in to : [email or web address] Or : [email or web address], password : [email or web address]

## 2018-02-12 NOTE — Telephone Encounter (Signed)
Call pt  Medication was filled on 01/23/18 with 90 tablets and a refill  How is he taking?  Has he picked up the refill?

## 2018-02-12 NOTE — Telephone Encounter (Signed)
Spoke with patient and he didn't request refill it was automatic from pharmacy.

## 2018-02-26 ENCOUNTER — Other Ambulatory Visit: Payer: Self-pay | Admitting: *Deleted

## 2018-02-26 MED ORDER — METOPROLOL SUCCINATE ER 100 MG PO TB24: ORAL_TABLET | ORAL | 3 refills | Status: DC

## 2018-03-13 ENCOUNTER — Other Ambulatory Visit: Payer: Self-pay | Admitting: Family Medicine

## 2018-03-16 ENCOUNTER — Encounter: Payer: Self-pay | Admitting: Family Medicine

## 2018-03-16 ENCOUNTER — Ambulatory Visit
Admission: RE | Admit: 2018-03-16 | Discharge: 2018-03-16 | Disposition: A | Discharge status: Home / Self Care | Payer: Medicare HMO | Source: Ambulatory Visit | Attending: Urology | Admitting: Urology

## 2018-03-16 DIAGNOSIS — N200 Calculus of kidney: Secondary | ICD-10-CM | POA: Diagnosis not present

## 2018-03-16 NOTE — Telephone Encounter (Signed)
Controlled substance database reviewed. Sent to pharmacy.   

## 2018-03-16 NOTE — Telephone Encounter (Signed)
Last office visit 11/16/17 Next office visit 05/18/18

## 2018-03-19 ENCOUNTER — Other Ambulatory Visit: Payer: Self-pay | Admitting: *Deleted

## 2018-03-19 MED ORDER — DIGOXIN 250 MCG PO TABS: ORAL_TABLET | ORAL | 11 refills | Status: DC

## 2018-03-19 MED ORDER — VERAPAMIL HCL ER 120 MG PO TBCR: 120.0000 mg | EXTENDED_RELEASE_TABLET | Freq: Every day | ORAL | 11 refills | Status: DC

## 2018-03-20 ENCOUNTER — Encounter: Payer: Self-pay | Admitting: Urology

## 2018-03-20 ENCOUNTER — Ambulatory Visit: Payer: Medicare HMO | Admitting: Urology

## 2018-03-20 VITALS — BP 124/79 | HR 59 | Resp 16 | Ht 71.0 in | Wt 318.2 lb

## 2018-03-20 DIAGNOSIS — N200 Calculus of kidney: Secondary | ICD-10-CM | POA: Diagnosis not present

## 2018-03-20 NOTE — Progress Notes (Signed)
03/20/2018 4:23 PM   Carlos Crosby 11/25/1955 [id number]  Referring provider: Leone Haven, MD [address] [place],  [address]  Chief Complaint  Patient presents with  . Follow-up    gstric bypass with KUB    HPI: 62 year old male with recurrent nephrolithiasis who presents today for routine six-month follow-up.  She was supposed to have gastric bypass several months ago but there is an issue with his insurance which is still being worked out.  Ultimately, he is decided not to pursue gastric bypass surgery and rather remain on his gastric bypass diet.  He has lost 30 pounds since his last visit.  KUB today is somewhat obscured, stone essentially unchanged.    He denies any pain, blood, or stone episodes.    Previous stone analysis from 2016 showed 79% calcium oxalate monohydrate, 10% calcium oxalate dihydrate, 15% calcium phosphate.   He underwent 19-JYNW urine metabolic work-up which was fairly unremarkable.  Please see previous note for details.  He is on multiple stone procedures in the past including PCNL, ureteroscopy and shockwave lithotripsy.    He does have a personal history of gout. He does report that his uric acid level was markedly elevated in the past. He is allergic to allopurinol.  Most recent uric acid level within normal limits.   PMH: Past Medical History:  Diagnosis Date  . Chronic a-fib   . Chronic systolic CHF (congestive heart failure) ([place])   . Depression   . Hemochromatosis   . History of kidney stones   . History of pulmonary embolism   . Hyperlipidemia   . Hypertension   . Migraine   . Obesity   . OSA (obstructive sleep apnea)    on CPAP  . Osteoarthritis    bilateral knees  . Rocky Mountain spotted fever 2011    Surgical History: Past Surgical History:  Procedure Laterality Date  . CARDIAC CATHETERIZATION  2012   [facility]  . KNEE SURGERY     right knee   . LITHOTRIPSY  07/31/2014  . URETERAL STENT PLACEMENT   2016  . URETEROSCOPY      Home Medications:  Allergies as of 03/20/2018      Reactions   Allopurinol Hives, Swelling   Other Hives   Diltiazem Itching, Rash   Penicillin G Nausea Only   And dirrhea   Ondansetron Rash   Ondansetron Hcl Rash   Penicillins Other (See Comments), Nausea Only   Pt unsure of rxn; showed up on allergy test Pt unsure of rxn; showed up on allergy test Pt unsure of rxn; showed up on allergy test And dirrhea      Medication List        Accurate as of 03/20/18  4:23 PM. Always use your most recent med list.          acetaminophen 500 MG tablet Commonly known as:  TYLENOL Take 1,000 mg by mouth every 6 (six) hours as needed.   apixaban 5 MG Tabs tablet Commonly known as:  ELIQUIS Take 1 tablet (5 mg total) by mouth 2 (two) times daily.   CENTRUM SILVER ADULT 50+ Tabs Take 1 tablet by mouth daily.   digoxin 0.25 MG tablet Commonly known as:  LANOXIN Take one tablet (0.25 mg) by mouth once daily six days a week   furosemide 40 MG tablet Commonly known as:  LASIX Take 40 mg by mouth daily as needed.   lisinopril 2.5 MG tablet Commonly known as:  PRINIVIL,ZESTRIL Take 1 tablet (2.5 mg total) by mouth daily.   loratadine 10 MG tablet Commonly known as:  CLARITIN Take 10 mg by mouth daily.   metoprolol succinate 100 MG 24 hr tablet Commonly known as:  TOPROL-XL TAKE 2 TABLETS EVERY MORNING  AND TAKE 1 TABLET EVERY EVENING   Potassium 95 MG Tabs Take 1 tablet by mouth.   pravastatin 80 MG tablet Commonly known as:  PRAVACHOL Take 1 tablet (80 mg total) by mouth every evening.   traMADol 50 MG tablet Commonly known as:  ULTRAM TAKE 1 TABLET BY MOUTH( 50 MG TOTAL) EVERY 6 HOURS AS NEEDED   verapamil 120 MG CR tablet Commonly known as:  CALAN-SR Take 1 tablet (120 mg total) by mouth daily.   Vitamin D 2000 units Caps       Allergies:  Allergies  Allergen Reactions  . Allopurinol Hives and Swelling  . Other Hives  .  Diltiazem Itching and Rash  . Penicillin G Nausea Only    And dirrhea  . Ondansetron Rash  . Ondansetron Hcl Rash  . Penicillins Other (See Comments) and Nausea Only    Pt unsure of rxn; showed up on allergy test Pt unsure of rxn; showed up on allergy test Pt unsure of rxn; showed up on allergy test And dirrhea     Family History: Family History  Problem Relation Age of Onset  . Thyroid disease Mother   . Cancer Father        lung  . Arthritis Father   . Hypertension Father   . Cancer Paternal Grandfather        Throat cancer    Social History:  reports that he has never smoked. He has never used smokeless tobacco. He reports that he does not drink alcohol or use drugs.  ROS: UROLOGY Frequent Urination?: No Hard to postpone urination?: No Burning/pain with urination?: No Get up at night to urinate?: No Leakage of urine?: No Urine stream starts and stops?: No Trouble starting stream?: No Do you have to strain to urinate?: No Blood in urine?: No Urinary tract infection?: No Sexually transmitted disease?: No Injury to kidneys or bladder?: No Painful intercourse?: No Weak stream?: No Erection problems?: No Penile pain?: No  Gastrointestinal Nausea?: No Vomiting?: No Indigestion/heartburn?: No Diarrhea?: No Constipation?: No  Constitutional Fever: No Night sweats?: No Weight loss?: Yes Fatigue?: No  Skin Skin rash/lesions?: No Itching?: No  Eyes Blurred vision?: No Double vision?: No  Ears/Nose/Throat Sore throat?: No Sinus problems?: No  Hematologic/Lymphatic Swollen glands?: No Easy bruising?: No  Cardiovascular Leg swelling?: No Chest pain?: No  Respiratory Cough?: No Shortness of breath?: No  Endocrine Excessive thirst?: No  Musculoskeletal Back pain?: No Joint pain?: Yes  Neurological Headaches?: No Dizziness?: No  Psychologic Depression?: No Anxiety?: No  Physical Exam: BP 124/79   Pulse (!) 59   Resp 16   Ht 5'  11" (1.803 m)   Wt (!) 318 lb 3.2 oz (144.3 kg)   BMI 44.38 kg/m   Constitutional:  Alert and oriented, No acute distress. HEENT: [place] AT, moist mucus membranes.  Trachea midline, no masses. Respiratory: Normal respiratory effort, no increased work of breathing. Skin: No rashes, bruises or suspicious lesions. Neurologic: Grossly intact, no focal deficits, moving all 4 extremities. Psychiatric: Normal mood and affect.  Laboratory Data: Lab Results  Component Value Date   WBC 7.7 01/30/2018   HGB 15.7 01/30/2018   HCT 45.7 01/30/2018   MCV 98.7 01/30/2018  PLT 207 01/30/2018    Lab Results  Component Value Date   CREATININE 0.98 11/16/2017    Lab Results  Component Value Date   PSA 0.16 01/26/2017    Lab Results  Component Value Date   HGBA1C 5.4 11/16/2017    Urinalysis Na  Pertinent Imaging: Results for orders placed during the hospital encounter of 03/16/18  DG Abd 1 View   Narrative CLINICAL DATA:  Follow-up kidney stones  EXAM: ABDOMEN - 1 VIEW  COMPARISON:  02/08/2018  FINDINGS: Scattered large and small bowel gas is noted. No abnormal mass is seen. The patient's known renal calculi are coned off the film on the left. No other calculi are noted. Degenerative changes of the lumbar spine are seen.  IMPRESSION: Previously seen left renal calculi are not well appreciated due to patient positioning. Further imaging versus CT can be helpful.   Electronically Signed   By: Inez Catalina M.D.   On: 03/16/2018 14:55    KUB personally reviewed today and compared to previous x-ray.  There are stones in the left lower pole which are seen and appear to be stable.  No obvious progression of stones or new stones.    Assessment & Plan:    1. Recurrent nephrolithiasis Stone stable today, asymptomatic without progression Reviewed stone diet again today-specifically discussed risk factors related to current weight loss diet No surgical intervention recommended,  observation Follow-up in 12 months with KUB or sooner as needed - DG Abd 1 View; Future   Return in about 1 year (around 03/21/2019) for KUB.  Hollice Espy, MD  [facility] [address], [place], [place] [address] [phone]

## 2018-04-01 NOTE — Progress Notes (Signed)
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  INTERVAL HISTORY: Patient returns to clinic today for repeat laboratory work, further evaluation, and consideration of phlebotomy.  He did not undergo gastric bypass surgery and is unclear if he will reschedule it since he has lost over 40 pounds on his own.  He continues to feel well and remains asymptomatic.  He has no neurologic complaints.  He denies any recent fevers or illnesses.  He has a good appetite and denies weight loss.  He has no chest pain or shortness of breath.  He denies any nausea, vomiting, constipation, or diarrhea.  He has no urinary complaints.  Patient offers no specific complaints today.  REVIEW OF SYSTEMS:   Review of Systems  Constitutional: Positive for weight loss. Negative for fever and malaise/fatigue.  Respiratory: Negative.  Negative for cough, hemoptysis and shortness of breath.   Cardiovascular: Negative.  Negative for chest pain and leg swelling.  Gastrointestinal: Negative.  Negative for abdominal pain, blood in stool and melena.  Genitourinary: Negative.  Negative for hematuria.  Musculoskeletal: Negative.  Negative for back pain.  Skin: Negative.  Negative for rash.  Neurological: Negative.  Negative for sensory change, focal weakness, weakness and headaches.  Psychiatric/Behavioral: Negative.  The patient is not nervous/anxious.     As per HPI. Otherwise, a complete review of systems is negative.  PAST MEDICAL HISTORY: Past Medical History:  Diagnosis Date  . Chronic a-fib   . Chronic systolic CHF (congestive heart failure) ([place])   . Depression   . Hemochromatosis   . History of kidney stones   . History of pulmonary embolism   .  Hyperlipidemia   . Hypertension   . Migraine   . Obesity   . OSA (obstructive sleep apnea)    on CPAP  . Osteoarthritis    bilateral knees  . Rocky Mountain spotted fever 2011    PAST SURGICAL HISTORY: Past Surgical History:  Procedure Laterality Date  . CARDIAC CATHETERIZATION  2012   [facility]  . KNEE SURGERY     right knee   . LITHOTRIPSY  07/31/2014  . URETERAL STENT PLACEMENT  2016  . URETEROSCOPY      FAMILY HISTORY: Family History  Problem Relation Age of Onset  . Thyroid disease Mother   . Cancer Father        lung  . Arthritis Father   . Hypertension Father   . Cancer Paternal Grandfather        Throat cancer    ADVANCED DIRECTIVES (Y/N):  N  HEALTH MAINTENANCE: Social History   Tobacco Use  . Smoking status: Never Smoker  . Smokeless tobacco: Never Used  Substance Use Topics  . Alcohol use: No    Frequency: Never    Comment: occasional; 3 times a year  . Drug use: No     Colonoscopy:  PAP:  Bone density:  Lipid panel:  Allergies  Allergen Reactions  . Allopurinol Hives and Swelling  . Other Hives  . Diltiazem Itching and Rash  . Penicillin G Nausea Only    And dirrhea  . Ondansetron Rash  . Ondansetron Hcl Rash  . Penicillins Other (See Comments) and Nausea Only    Pt unsure of rxn; showed up on allergy test Pt unsure  of rxn; showed up on allergy test Pt unsure of rxn; showed up on allergy test And dirrhea     Current Outpatient Medications  Medication Sig Dispense Refill  . acetaminophen (TYLENOL) 500 MG tablet Take 1,000 mg by mouth every 6 (six) hours as needed.    Marland Kitchen apixaban (ELIQUIS) 5 MG TABS tablet Take 1 tablet (5 mg total) by mouth 2 (two) times daily. 180 tablet 3  . Cholecalciferol (VITAMIN D) 2000 units CAPS     . digoxin (LANOXIN) 0.25 MG tablet Take one tablet (0.25 mg) by mouth once daily six days a week 30 tablet 11  . furosemide (LASIX) 40 MG tablet Take 40 mg by mouth daily as needed.     Marland Kitchen lisinopril  (PRINIVIL,ZESTRIL) 2.5 MG tablet Take 1 tablet (2.5 mg total) by mouth daily. 90 tablet 1  . loratadine (CLARITIN) 10 MG tablet Take 10 mg by mouth daily.    . metoprolol succinate (TOPROL-XL) 100 MG 24 hr tablet TAKE 2 TABLETS EVERY MORNING  AND TAKE 1 TABLET EVERY EVENING 270 tablet 3  . Multiple Vitamins-Minerals (CENTRUM SILVER ADULT 50+) TABS Take 1 tablet by mouth daily.    . Potassium 95 MG TABS Take 1 tablet by mouth.    . pravastatin (PRAVACHOL) 80 MG tablet Take 1 tablet (80 mg total) by mouth every evening. 90 tablet 3  . traMADol (ULTRAM) 50 MG tablet TAKE 1 TABLET BY MOUTH( 50 MG TOTAL) EVERY 6 HOURS AS NEEDED 90 tablet 0  . verapamil (CALAN-SR) 120 MG CR tablet Take 1 tablet (120 mg total) by mouth daily. 30 tablet 11   No current facility-administered medications for this visit.     OBJECTIVE: Vitals:   04/03/18 1444 04/03/18 1451  BP:  123/85  Pulse:  96  Resp: 16   Temp:  98 F (36.7 C)     Body mass index is 43.46 kg/m.    ECOG FS:0 - Asymptomatic  General: Well-developed, well-nourished, no acute distress. Eyes: Pink conjunctiva, anicteric sclera. HEENT: Normocephalic, moist mucous membranes. Lungs: Clear to auscultation bilaterally. Heart: Regular rate and rhythm. No rubs, murmurs, or gallops. Abdomen: Soft, nontender, nondistended. No organomegaly noted, normoactive bowel sounds. Musculoskeletal: No edema, cyanosis, or clubbing. Neuro: Alert, answering all questions appropriately. Cranial nerves grossly intact. Skin: No rashes or petechiae noted. Psych: Normal affect.  LAB RESULTS:  Lab Results  Component Value Date   NA 137 11/16/2017   K 4.4 11/16/2017   CL 101 11/16/2017   CO2 25 11/16/2017   GLUCOSE 106 (H) 11/16/2017   BUN 19 11/16/2017   CREATININE 0.98 11/16/2017   CALCIUM 9.9 11/16/2017   PROT 7.2 11/16/2017   ALBUMIN 4.3 11/16/2017   AST 18 11/16/2017   ALT 18 11/16/2017   ALKPHOS 45 11/16/2017   BILITOT 0.8 11/16/2017   GFRNONAA >60  03/30/2017   GFRAA >60 03/30/2017    Lab Results  Component Value Date   WBC 7.2 04/02/2018   NEUTROABS 4.2 04/02/2018   HGB 16.1 04/02/2018   HCT 46.1 04/02/2018   MCV 94.1 04/02/2018   PLT 207 04/02/2018   Lab Results  Component Value Date   IRON 95 04/02/2018   TIBC 319 04/02/2018   IRONPCTSAT 30 04/02/2018   Lab Results  Component Value Date   FERRITIN 299 04/02/2018     STUDIES: Dg Abd 1 View  Result Date: 03/16/2018 CLINICAL DATA:  Follow-up kidney stones EXAM: ABDOMEN - 1 VIEW COMPARISON:  02/08/2018 FINDINGS: Scattered large and  small bowel gas is noted. No abnormal mass is seen. The patient's known renal calculi are coned off the film on the left. No other calculi are noted. Degenerative changes of the lumbar spine are seen. IMPRESSION: Previously seen left renal calculi are not well appreciated due to patient positioning. Further imaging versus CT can be helpful. Electronically Signed   By: Inez Catalina M.D.   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  Proceed with 400 mL phlebotomy today.  Because patient is on Eliquis, he cannot receive his phlebotomy through [facility].  Return to clinic monthly for 3 months for laboratory work and phlebotomy and then in 4 months for further evaluation and continuation of phlebotomy if necessary. 2.  Weight loss: Intentional.  Patient is unclear if he will proceed with gastric bypass surgery at this time.  I spent a total of 30 minutes face-to-face with the patient of which greater than 50% of the visit was spent in counseling and coordination of care as detailed above.   Patient expressed understanding and was in agreement with this plan. He also understands that He can call clinic at any time with any questions, concerns, or  complaints.    Lloyd Huger, MD   04/06/2018 12:07 PM

## 2018-04-02 ENCOUNTER — Inpatient Hospital Stay: Payer: Medicare HMO | Attending: Oncology

## 2018-04-02 DIAGNOSIS — Z79899 Other long term (current) drug therapy: Secondary | ICD-10-CM | POA: Insufficient documentation

## 2018-04-02 LAB — IRON AND TIBC
Iron: 95 ug/dL (ref 45–182)
Saturation Ratios: 30 % (ref 17.9–39.5)
TIBC: 319 ug/dL (ref 250–450)
UIBC: 224 ug/dL

## 2018-04-02 LAB — CBC WITH DIFFERENTIAL/PLATELET
Abs Immature Granulocytes: 0.02 10*3/uL (ref 0.00–0.07)
Basophils Absolute: 0 10*3/uL (ref 0.0–0.1)
Basophils Relative: 1 %
Eosinophils Absolute: 0.3 10*3/uL (ref 0.0–0.5)
Eosinophils Relative: 4 %
HCT: 46.1 % (ref 39.0–52.0)
Hemoglobin: 16.1 g/dL (ref 13.0–17.0)
Immature Granulocytes: 0 %
Lymphocytes Relative: 30 %
Lymphs Abs: 2.1 10*3/uL (ref 0.7–4.0)
MCH: 32.9 pg (ref 26.0–34.0)
MCHC: 34.9 g/dL (ref 30.0–36.0)
MCV: 94.1 fL (ref 80.0–100.0)
Monocytes Absolute: 0.6 10*3/uL (ref 0.1–1.0)
Monocytes Relative: 8 %
Neutro Abs: 4.2 10*3/uL (ref 1.7–7.7)
Neutrophils Relative %: 57 %
Platelets: 207 10*3/uL (ref 150–400)
RBC: 4.9 MIL/uL (ref 4.22–5.81)
RDW: 11.9 % (ref 11.5–15.5)
WBC: 7.2 10*3/uL (ref 4.0–10.5)
nRBC: 0 % (ref 0.0–0.2)

## 2018-04-02 LAB — FERRITIN: Ferritin: 299 ng/mL (ref 24–336)

## 2018-04-03 ENCOUNTER — Inpatient Hospital Stay (HOSPITAL_BASED_OUTPATIENT_CLINIC_OR_DEPARTMENT_OTHER): Payer: Medicare HMO | Admitting: Oncology

## 2018-04-03 ENCOUNTER — Inpatient Hospital Stay: Payer: Medicare HMO

## 2018-04-03 ENCOUNTER — Encounter: Payer: Self-pay | Admitting: Oncology

## 2018-04-03 ENCOUNTER — Other Ambulatory Visit: Payer: Self-pay

## 2018-04-03 DIAGNOSIS — Z79899 Other long term (current) drug therapy: Secondary | ICD-10-CM | POA: Diagnosis not present

## 2018-04-03 DIAGNOSIS — R634 Abnormal weight loss: Secondary | ICD-10-CM | POA: Diagnosis not present

## 2018-04-03 NOTE — Progress Notes (Signed)
Patient here for follow up. Patient reports no changes.

## 2018-04-04 ENCOUNTER — Encounter: Payer: Self-pay | Admitting: Family Medicine

## 2018-04-04 ENCOUNTER — Encounter: Payer: Self-pay | Admitting: Oncology

## 2018-04-09 ENCOUNTER — Telehealth: Payer: Self-pay | Admitting: Dietician

## 2018-04-09 NOTE — Telephone Encounter (Signed)
Patient returned phone call; he has been following the pre-op diet since 02/12/18 (surgery was originally scheduled for 03/01/18 per patient), with meals consisting of lean protein and low-carb vegetables, and strictly limiting his carb intake. He has been losing weight steadily. He reports some swimming and some walking for exercise. He has bariatric vitamins to begin taking after surgery, and understands post-op liquid diet as well as protein and fluid needs.   Encouraged patient to call back if any other questions or concerns arise prior to surgery. He seems to be well prepared and motivated.

## 2018-04-09 NOTE — Telephone Encounter (Signed)
Called patient to review information from pre-op class; left voicemail message requesting a call back; offered in-person follow up if patient has multiple questions or concerns.

## 2018-04-10 NOTE — Progress Notes (Signed)
Please place orders in Epic as patient is being scheduled for a pre-op appointment! Thank you! 

## 2018-04-11 MED ORDER — PRAVASTATIN SODIUM 80 MG PO TABS: 80.0000 mg | ORAL_TABLET | Freq: Every evening | ORAL | 3 refills | Status: DC

## 2018-04-19 ENCOUNTER — Encounter: Payer: Self-pay | Admitting: Cardiovascular Disease

## 2018-04-19 NOTE — Progress Notes (Signed)
Eliquis 5 mg samples given: Lot: [id number] Exp: 11/21 # 3 boxes   Placed at the front desk for patient pick up. See My Chart message.

## 2018-04-19 NOTE — Progress Notes (Signed)
Please place orders in Epic as patient has a Pre-op appointment on 04/26/2018! Thank you!

## 2018-04-23 ENCOUNTER — Other Ambulatory Visit: Payer: Self-pay | Admitting: Family Medicine

## 2018-04-24 MED ORDER — TRAMADOL HCL 50 MG PO TABS: 50.0000 mg | ORAL_TABLET | Freq: Four times a day (QID) | ORAL | 0 refills | Status: DC | PRN

## 2018-04-24 NOTE — Telephone Encounter (Signed)
Controlled substance database reviewed. Sent to pharmacy.   

## 2018-04-26 ENCOUNTER — Inpatient Hospital Stay (HOSPITAL_COMMUNITY)
Admission: RE | Admit: 2018-04-26 | Discharge: 2018-04-26 | Disposition: A | Discharge status: Home / Self Care | Payer: Self-pay | Source: Ambulatory Visit

## 2018-05-01 ENCOUNTER — Encounter (HOSPITAL_COMMUNITY): Admission: RE | Payer: Self-pay | Source: Ambulatory Visit

## 2018-05-01 ENCOUNTER — Inpatient Hospital Stay (HOSPITAL_COMMUNITY): Admission: RE | Admit: 2018-05-01 | Payer: Medicare HMO | Source: Ambulatory Visit | Admitting: Surgery

## 2018-05-01 SURGERY — GASTRECTOMY, SLEEVE, LAPAROSCOPIC
Anesthesia: General

## 2018-05-08 ENCOUNTER — Inpatient Hospital Stay: Payer: Medicare HMO

## 2018-05-08 ENCOUNTER — Other Ambulatory Visit: Payer: Self-pay

## 2018-05-08 ENCOUNTER — Inpatient Hospital Stay: Payer: Medicare HMO | Attending: Oncology

## 2018-05-08 LAB — CBC WITH DIFFERENTIAL/PLATELET
Abs Immature Granulocytes: 0.04 10*3/uL (ref 0.00–0.07)
Basophils Absolute: 0 10*3/uL (ref 0.0–0.1)
Basophils Relative: 1 %
Eosinophils Absolute: 0.2 10*3/uL (ref 0.0–0.5)
Eosinophils Relative: 2 %
HCT: 45.9 % (ref 39.0–52.0)
Hemoglobin: 16 g/dL (ref 13.0–17.0)
Immature Granulocytes: 1 %
Lymphocytes Relative: 32 %
Lymphs Abs: 2.3 10*3/uL (ref 0.7–4.0)
MCH: 33 pg (ref 26.0–34.0)
MCHC: 34.9 g/dL (ref 30.0–36.0)
MCV: 94.6 fL (ref 80.0–100.0)
Monocytes Absolute: 0.6 10*3/uL (ref 0.1–1.0)
Monocytes Relative: 8 %
Neutro Abs: 4.2 10*3/uL (ref 1.7–7.7)
Neutrophils Relative %: 56 %
Platelets: 202 10*3/uL (ref 150–400)
RBC: 4.85 MIL/uL (ref 4.22–5.81)
RDW: 13 % (ref 11.5–15.5)
WBC: 7.3 10*3/uL (ref 4.0–10.5)
nRBC: 0 % (ref 0.0–0.2)

## 2018-05-08 LAB — IRON AND TIBC
Iron: 95 ug/dL (ref 45–182)
Saturation Ratios: 28 % (ref 17.9–39.5)
TIBC: 335 ug/dL (ref 250–450)
UIBC: 240 ug/dL

## 2018-05-08 LAB — FERRITIN: Ferritin: 201 ng/mL (ref 24–336)

## 2018-05-08 NOTE — Progress Notes (Signed)
Per Dr. Grayland Ormond it is ok to proceed with phlebotomy treatment prior to getting the ferritin results. Phlebotomy performed. 400 ml removed as ordered.

## 2018-05-11 ENCOUNTER — Ambulatory Visit: Payer: Self-pay | Admitting: Dietician

## 2018-05-18 ENCOUNTER — Encounter: Payer: Self-pay | Admitting: Family Medicine

## 2018-05-18 ENCOUNTER — Ambulatory Visit (INDEPENDENT_AMBULATORY_CARE_PROVIDER_SITE_OTHER): Payer: Medicare HMO | Admitting: Family Medicine

## 2018-05-18 VITALS — BP 108/70 | HR 73 | Temp 97.5°F | Ht 71.0 in | Wt 301.0 lb

## 2018-05-18 DIAGNOSIS — I4891 Unspecified atrial fibrillation: Secondary | ICD-10-CM

## 2018-05-18 DIAGNOSIS — M170 Bilateral primary osteoarthritis of knee: Secondary | ICD-10-CM | POA: Diagnosis not present

## 2018-05-18 DIAGNOSIS — G4733 Obstructive sleep apnea (adult) (pediatric): Secondary | ICD-10-CM

## 2018-05-18 DIAGNOSIS — E559 Vitamin D deficiency, unspecified: Secondary | ICD-10-CM

## 2018-05-18 DIAGNOSIS — R7303 Prediabetes: Secondary | ICD-10-CM

## 2018-05-18 DIAGNOSIS — Z9989 Dependence on other enabling machines and devices: Secondary | ICD-10-CM | POA: Diagnosis not present

## 2018-05-18 DIAGNOSIS — E7800 Pure hypercholesterolemia, unspecified: Secondary | ICD-10-CM | POA: Diagnosis not present

## 2018-05-18 LAB — COMPREHENSIVE METABOLIC PANEL
ALT: 19 U/L (ref 0–53)
AST: 20 U/L (ref 0–37)
Albumin: 4.7 g/dL (ref 3.5–5.2)
Alkaline Phosphatase: 36 U/L — ABNORMAL LOW (ref 39–117)
BUN: 16 mg/dL (ref 6–23)
CO2: 23 mEq/L (ref 19–32)
Calcium: 9.7 mg/dL (ref 8.4–10.5)
Chloride: 101 mEq/L (ref 96–112)
Creatinine, Ser: 0.87 mg/dL (ref 0.40–1.50)
GFR: 94.71 mL/min (ref >60.00)
Glucose, Bld: 94 mg/dL (ref 70–99)
Potassium: 4.4 mEq/L (ref 3.5–5.1)
Sodium: 138 mEq/L (ref 135–145)
Total Bilirubin: 0.9 mg/dL (ref 0.2–1.2)
Total Protein: 7.3 g/dL (ref 6.0–8.3)

## 2018-05-18 LAB — HEMOGLOBIN A1C: Hgb A1c MFr Bld: 5 % (ref 4.6–6.5)

## 2018-05-18 LAB — LDL CHOLESTEROL, DIRECT: Direct LDL: 91 mg/dL

## 2018-05-18 LAB — VITAMIN D 25 HYDROXY (VIT D DEFICIENCY, FRACTURES): VITD: 43.76 ng/mL (ref 30.00–100.00)

## 2018-05-18 MED ORDER — TRAMADOL HCL 50 MG PO TABS: 50.0000 mg | ORAL_TABLET | Freq: Four times a day (QID) | ORAL | 0 refills | Status: DC | PRN

## 2018-05-18 NOTE — Assessment & Plan Note (Signed)
Rate controlled.  We will check a digoxin level at patient request.  Continue current medications.  Continue to see cardiology.

## 2018-05-18 NOTE — Assessment & Plan Note (Signed)
Congratulated on weight loss.  Encouraged adding 200 to 300 cal/day to his diet and increasing activity.

## 2018-05-18 NOTE — Assessment & Plan Note (Addendum)
Continue tramadol.  I encouraged continued weight loss.  Offered referral to see a orthopedist to see what options he has.  He noted he would think about this.  Tramadol refilled.  Controlled substance database reviewed.

## 2018-05-18 NOTE — Assessment & Plan Note (Signed)
Well-controlled.  Continue CPAP. 

## 2018-05-18 NOTE — Progress Notes (Signed)
[name], MD Phone: [phone]  Carlos Crosby is a 62 y.o. male who presents today for f/u.  CC: Osteoarthritis, A. fib, OSA, obesity  Osteoarthritis: Patient continues to take tramadol intermittently.  Will make him drowsy if he takes it with Benadryl though he tries not do that.  He notes no alcohol use.  He does note a soft tissue prominence just proximal to the left knee lateral portion that he has had evaluated by orthopedics in the past.  He reports they advised him it is a calcium deposit.  He notes this has been present for years and will enlarge and then get smaller.  He notes when he received gel injections in his knees it got smaller.  He notes his knees feel a lot better with his weight loss.  A. fib: He is taking digoxin, metoprolol, and Eliquis.  No palpitations, bleeding, chest pain, or shortness of breath.  He would like his digoxin level checked today.  Obesity: He is down about 45 pounds since starting a diet in September.  He has been doing 1000 cal a day.  He he was swimming 4-5 times a week though that is decreased somewhat.  He is eating more vegetables and low carbohydrates.  He decided not to go through with bariatric surgery.  OSA: He is using his CPAP nightly.  No hypersomnia.  He does wake up well rested.  He is wearing his CPAP 7 to 8 hours a night.  Social History   Tobacco Use  Smoking Status Never Smoker  Smokeless Tobacco Never Used     ROS see history of present illness  Objective  Physical Exam Vitals:   05/18/18 0826  BP: 108/70  Pulse: 73  Temp: (!) 97.5 F (36.4 C)  SpO2: 98%    BP Readings from Last 3 Encounters:  05/18/18 108/70  05/08/18 122/83  04/03/18 119/78   Wt Readings from Last 3 Encounters:  05/18/18 (!) 301 lb (136.5 kg)  04/03/18 (!) 311 lb 9.6 oz (141.3 kg)  03/20/18 (!) 318 lb 3.2 oz (144.3 kg)    Physical Exam Constitutional:      General: He is not in acute distress.    Appearance: He is not  diaphoretic.  Cardiovascular:     Rate and Rhythm: Normal rate and regular rhythm.     Heart sounds: Normal heart sounds.  Pulmonary:     Effort: Pulmonary effort is normal.     Breath sounds: Normal breath sounds.  Musculoskeletal:     Right lower leg: No edema.     Left lower leg: No edema.     Comments: Bilateral knees with no warmth, erythema, or tenderness, there is a area just superior to his knee with a soft tissue prominence though no palpable underlying mass  Skin:    General: Skin is warm and dry.  Neurological:     Mental Status: He is alert.      Assessment/Plan: Please see individual problem list.  Atrial fibrillation ([place]) Rate controlled.  We will check a digoxin level at patient request.  Continue current medications.  Continue to see cardiology.  Obstructive sleep apnea on CPAP Well-controlled.  Continue CPAP.  Osteoarthritis of both knees Continue tramadol.  I encouraged continued weight loss.  Offered referral to see a orthopedist to see what options he has.  He noted he would think about this.  Tramadol refilled.  Controlled substance database reviewed.  Morbid obesity Congratulated on weight loss.  Encouraged adding 200 to  300 cal/day to his diet and increasing activity.    Orders Placed This Encounter  Procedures  . Comp Met (CMET)  . Digoxin level  . Vitamin D (25 hydroxy)  . HgB A1c  . Direct LDL    Meds ordered this encounter  Medications  . traMADol (ULTRAM) 50 MG tablet    Sig: Take 1 tablet (50 mg total) by mouth every 6 (six) hours as needed.    Dispense:  90 tablet    Refill:  0     [name], MD [place]

## 2018-05-18 NOTE — Patient Instructions (Addendum)
Nice to see you. We will check lab work today and contact you with the results. Please add in 200-300 more calories per day.  Please try to exercise.

## 2018-05-19 LAB — DIGOXIN LEVEL: Digoxin Level: 1.6 mcg/L (ref 0.8–2.0)

## 2018-05-21 ENCOUNTER — Encounter: Payer: Self-pay | Admitting: Cardiovascular Disease

## 2018-05-21 NOTE — Progress Notes (Signed)
Per MyChart message- samples requested:  Eliquis 5 mg Lot: [id number] Exp: 6/22 # 3 boxes   Placed at the front desk.

## 2018-05-22 ENCOUNTER — Other Ambulatory Visit: Payer: Self-pay | Admitting: *Deleted

## 2018-05-22 MED ORDER — DIGOXIN 125 MCG PO TABS: ORAL_TABLET | ORAL | 6 refills | Status: DC

## 2018-05-23 ENCOUNTER — Other Ambulatory Visit: Payer: Self-pay | Admitting: *Deleted

## 2018-05-23 DIAGNOSIS — Z79899 Other long term (current) drug therapy: Secondary | ICD-10-CM

## 2018-05-23 DIAGNOSIS — I4819 Other persistent atrial fibrillation: Secondary | ICD-10-CM

## 2018-05-23 NOTE — Progress Notes (Signed)
Lab ordered.

## 2018-06-07 ENCOUNTER — Telehealth: Payer: Self-pay

## 2018-06-07 NOTE — Telephone Encounter (Signed)
Paper prior auth received for metoprolol Succinate 100MG  Filled out the paper work. Stamped it with Dr. Donivan Scull name. Faxed to [phone] Give to [facility], RN after faxed.

## 2018-06-08 ENCOUNTER — Other Ambulatory Visit: Payer: Self-pay | Admitting: Internal Medicine

## 2018-06-08 ENCOUNTER — Telehealth: Payer: Self-pay | Admitting: Pharmacist

## 2018-06-08 ENCOUNTER — Other Ambulatory Visit (INDEPENDENT_AMBULATORY_CARE_PROVIDER_SITE_OTHER): Payer: Medicare HMO

## 2018-06-08 DIAGNOSIS — I4891 Unspecified atrial fibrillation: Secondary | ICD-10-CM

## 2018-06-08 DIAGNOSIS — Z79899 Other long term (current) drug therapy: Secondary | ICD-10-CM | POA: Diagnosis not present

## 2018-06-08 DIAGNOSIS — I4819 Other persistent atrial fibrillation: Secondary | ICD-10-CM | POA: Diagnosis not present

## 2018-06-08 MED ORDER — APIXABAN 5 MG PO TABS: 5.0000 mg | ORAL_TABLET | Freq: Two times a day (BID) | ORAL | 1 refills | Status: DC

## 2018-06-08 NOTE — Telephone Encounter (Signed)
Please review for refill, Thanks !  

## 2018-06-08 NOTE — Telephone Encounter (Signed)
rx for apixaban sent to [facility]

## 2018-06-09 LAB — DIGOXIN LEVEL: Digoxin, Serum: 0.8 ng/mL (ref 0.5–0.9)

## 2018-06-11 ENCOUNTER — Other Ambulatory Visit: Payer: Self-pay

## 2018-06-11 ENCOUNTER — Telehealth: Payer: Self-pay | Admitting: *Deleted

## 2018-06-11 DIAGNOSIS — I4891 Unspecified atrial fibrillation: Secondary | ICD-10-CM

## 2018-06-11 MED ORDER — APIXABAN 5 MG PO TABS: 5.0000 mg | ORAL_TABLET | Freq: Two times a day (BID) | ORAL | 1 refills | Status: DC

## 2018-06-11 MED ORDER — DIGOXIN 125 MCG PO TABS: ORAL_TABLET | ORAL | 3 refills | Status: DC

## 2018-06-11 NOTE — Telephone Encounter (Signed)
-----   Message from Deboraha Sprang, MD sent at 06/11/2018  3:03 PM EST ----- Please inform patient that drug surveillance labs are normal

## 2018-06-11 NOTE — Telephone Encounter (Signed)
Refill Request.  

## 2018-06-11 NOTE — Telephone Encounter (Signed)
Pt asked for a refill to be sent into [facility] for the digoxin. I verified dosage with pt. Pt asked if he looses more weight should he let Dr. Caryl Comes know in regards to the digoxin. I advised pt yes he should let Dr. Caryl Comes know if he continues to loose weight. Pt thanked me for the call.

## 2018-06-12 ENCOUNTER — Inpatient Hospital Stay: Payer: Medicare HMO

## 2018-06-12 ENCOUNTER — Inpatient Hospital Stay: Payer: Medicare HMO | Attending: Oncology

## 2018-06-12 ENCOUNTER — Telehealth: Payer: Self-pay | Admitting: *Deleted

## 2018-06-12 NOTE — Telephone Encounter (Signed)
Pt requiring PA for Metoprolol Succinate 100 mg tablet.   PA has been faxed to [facility]  Fax: [phone] [phone]

## 2018-06-15 ENCOUNTER — Encounter: Payer: Self-pay | Admitting: Oncology

## 2018-06-27 DIAGNOSIS — G4730 Sleep apnea, unspecified: Secondary | ICD-10-CM | POA: Diagnosis not present

## 2018-06-27 DIAGNOSIS — G4733 Obstructive sleep apnea (adult) (pediatric): Secondary | ICD-10-CM | POA: Diagnosis not present

## 2018-06-29 NOTE — Telephone Encounter (Signed)
Spoke with Carlos Crosby from [place].  Metoprolol Succ. 100MG  was approved.  Approval dates 06/05/2018 through 06/06/2019 PA# [id number]

## 2018-06-29 NOTE — Telephone Encounter (Signed)
Spoke with Carlos Crosby from [place].  Metoprolol Succ. 100MG  was approved.  Approval dates 06/05/2018 through 06/06/2019 PA# [id number]

## 2018-07-08 ENCOUNTER — Other Ambulatory Visit: Payer: Self-pay | Admitting: Cardiovascular Disease

## 2018-07-09 ENCOUNTER — Encounter: Payer: Self-pay | Admitting: Family Medicine

## 2018-07-09 ENCOUNTER — Other Ambulatory Visit: Payer: Self-pay | Admitting: Family Medicine

## 2018-07-09 DIAGNOSIS — M170 Bilateral primary osteoarthritis of knee: Secondary | ICD-10-CM

## 2018-07-09 MED ORDER — TRAMADOL HCL 50 MG PO TABS: 50.0000 mg | ORAL_TABLET | Freq: Four times a day (QID) | ORAL | 0 refills | Status: DC | PRN

## 2018-07-09 NOTE — Telephone Encounter (Signed)
Sent to PCP to place referral  

## 2018-07-09 NOTE — Telephone Encounter (Signed)
Refilled: 05/18/2018 Last OV: 09/24/2018 Next OV: not scheduled

## 2018-07-09 NOTE — Telephone Encounter (Signed)
Controlled substance database reviewed. Sent to pharmacy.   

## 2018-07-10 ENCOUNTER — Inpatient Hospital Stay: Payer: Medicare HMO

## 2018-07-10 ENCOUNTER — Other Ambulatory Visit: Payer: Self-pay

## 2018-07-10 ENCOUNTER — Inpatient Hospital Stay: Payer: Medicare HMO | Attending: Oncology

## 2018-07-10 LAB — CBC WITH DIFFERENTIAL/PLATELET
Abs Immature Granulocytes: 0.04 10*3/uL (ref 0.00–0.07)
Basophils Absolute: 0.1 10*3/uL (ref 0.0–0.1)
Basophils Relative: 1 %
Eosinophils Absolute: 0.1 10*3/uL (ref 0.0–0.5)
Eosinophils Relative: 2 %
HCT: 46 % (ref 39.0–52.0)
Hemoglobin: 15.7 g/dL (ref 13.0–17.0)
Immature Granulocytes: 1 %
Lymphocytes Relative: 37 %
Lymphs Abs: 3 10*3/uL (ref 0.7–4.0)
MCH: 32.3 pg (ref 26.0–34.0)
MCHC: 34.1 g/dL (ref 30.0–36.0)
MCV: 94.7 fL (ref 80.0–100.0)
Monocytes Absolute: 0.5 10*3/uL (ref 0.1–1.0)
Monocytes Relative: 6 %
Neutro Abs: 4.3 10*3/uL (ref 1.7–7.7)
Neutrophils Relative %: 53 %
Platelets: 233 10*3/uL (ref 150–400)
RBC: 4.86 MIL/uL (ref 4.22–5.81)
RDW: 12.5 % (ref 11.5–15.5)
WBC: 8 10*3/uL (ref 4.0–10.5)
nRBC: 0 % (ref 0.0–0.2)

## 2018-07-10 LAB — IRON AND TIBC
Iron: 123 ug/dL (ref 45–182)
Saturation Ratios: 36 % (ref 17.9–39.5)
TIBC: 338 ug/dL (ref 250–450)
UIBC: 215 ug/dL

## 2018-07-10 LAB — FERRITIN: Ferritin: 160 ng/mL (ref 24–336)

## 2018-07-10 NOTE — Progress Notes (Unsigned)
Waiting for ferritin results, Patient will return at another time this week for phlebotomy.

## 2018-07-12 ENCOUNTER — Inpatient Hospital Stay: Payer: Medicare HMO

## 2018-07-17 ENCOUNTER — Inpatient Hospital Stay: Payer: Medicare HMO

## 2018-07-18 DIAGNOSIS — G4730 Sleep apnea, unspecified: Secondary | ICD-10-CM | POA: Diagnosis not present

## 2018-07-18 DIAGNOSIS — G4733 Obstructive sleep apnea (adult) (pediatric): Secondary | ICD-10-CM | POA: Diagnosis not present

## 2018-07-23 ENCOUNTER — Telehealth (INDEPENDENT_AMBULATORY_CARE_PROVIDER_SITE_OTHER): Payer: Self-pay | Admitting: Orthopaedic Surgery

## 2018-07-23 ENCOUNTER — Ambulatory Visit (INDEPENDENT_AMBULATORY_CARE_PROVIDER_SITE_OTHER): Payer: Medicare HMO | Admitting: Orthopaedic Surgery

## 2018-07-23 ENCOUNTER — Encounter (INDEPENDENT_AMBULATORY_CARE_PROVIDER_SITE_OTHER): Payer: Self-pay | Admitting: Orthopaedic Surgery

## 2018-07-23 ENCOUNTER — Ambulatory Visit (INDEPENDENT_AMBULATORY_CARE_PROVIDER_SITE_OTHER): Payer: Self-pay

## 2018-07-23 ENCOUNTER — Ambulatory Visit (INDEPENDENT_AMBULATORY_CARE_PROVIDER_SITE_OTHER): Payer: Medicare HMO

## 2018-07-23 VITALS — BP 104/73 | HR 90 | Ht 71.0 in | Wt 280.0 lb

## 2018-07-23 DIAGNOSIS — G8929 Other chronic pain: Secondary | ICD-10-CM

## 2018-07-23 DIAGNOSIS — M25562 Pain in left knee: Secondary | ICD-10-CM

## 2018-07-23 DIAGNOSIS — M25561 Pain in right knee: Secondary | ICD-10-CM | POA: Diagnosis not present

## 2018-07-23 MED ORDER — BUPIVACAINE HCL 0.5 % IJ SOLN
2.0000 mL | INTRAMUSCULAR | Status: AC | PRN
  Administered 2018-07-23: 2 mL via INTRA_ARTICULAR

## 2018-07-23 MED ORDER — METHYLPREDNISOLONE ACETATE 40 MG/ML IJ SUSP
80.0000 mg | INTRAMUSCULAR | Status: AC | PRN
  Administered 2018-07-23: 80 mg via INTRA_ARTICULAR

## 2018-07-23 MED ORDER — LIDOCAINE HCL 1 % IJ SOLN
2.0000 mL | INTRAMUSCULAR | Status: AC | PRN
  Administered 2018-07-23: 2 mL

## 2018-07-23 NOTE — Telephone Encounter (Signed)
Bilateral knee visco precert for Dr.Whitfield patient.

## 2018-07-23 NOTE — Progress Notes (Signed)
Office Visit Note   Patient: Carlos Crosby           Date of Birth: [birth year]           MRN: [id number] Visit Date: 07/23/2018              Requested by: Leone Haven, MD [address] [place], [place] [address] PCP: Leone Haven, MD   Assessment & Plan: Visit Diagnoses:  1. Chronic pain of right knee   2. Chronic pain of left knee     Plan: Bilateral end-stage osteoarthritis of the knees.  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  Long discussion regarding diagnosis and treatment options.  Office visit over 45 minutes 50% of the time in counseling.  Will inject both knees with cortisone and pre-certify Visco supplementation.  Mr. Salemi is aware of total knee replacement is the definitive procedure but is hesitant to consider that based on his medical comorbidities and the fact that his knees are feeling better since he has had weight loss and has been exercising  Follow-Up Instructions: Return pre cert visco.   Orders:  Orders Placed This Encounter  Procedures  . Large Joint Inj: bilateral knee  . XR KNEE 3 VIEW LEFT  . XR KNEE 3 VIEW RIGHT   No orders of the defined types were placed in this encounter.     Procedures: Large Joint Inj: bilateral knee on 07/23/2018 8:49 AM Indications: diagnostic evaluation Details: 25 G 1.5 in needle, anteromedial approach  Arthrogram: No  Medications (Right): 2 mL lidocaine 1 %; 2 mL bupivacaine 0.5 %; 80 mg methylPREDNISolone acetate 40 MG/ML Medications (Left): 2 mL lidocaine 1 %; 2 mL bupivacaine 0.5 %; 80 mg methylPREDNISolone acetate 40 MG/ML Consent was given by the patient. Immediately prior to procedure a time out was called to verify the correct patient, procedure, equipment, support staff and site/side marked as required. Patient was prepped and draped in the usual sterile fashion.       Clinical Data: No additional findings.   Subjective: Chief Complaint  Patient presents with  . Left Knee - Pain  .  Right Knee - Pain  Patient presents today with bilateral knee pain. Patient states that the right knee is worse. He experiences grinding, catching, clicking, swelling, and popping in both knees. He said that they seems to give way. He went to [facility] over a year ago and had x-rays taken. He was told that both knees are bone on bone. He is taking Tylenol, Biorfreeze,  and Tramadol as needed for pain.  Patient has lost 75lb since December due to changing the way he eats. Carlos Crosby relates a long history of bilateral knee problems.  He has had multiple surgeries on his right knee dating back to when he was a teenager.  Over the years he has gained "considerable weight.  Since December he is lost 75 pounds watching his calorie intake limiting it to 1500 cal.  He notes that his knees are actually feeling better.  He is aware as noted above that he has arthritis but wants to avoid surgery if possible because he is has a history of poorly controlled atrial fibrillation with a prior pulmonary emboli.  He is on Eliquis.  He has never been diagnosed with diabetes and relates is hemoglobin A1c is approximately 5 since he lost weight.  He does experience some tingling in his toes.  He presently is exercising and feels that his knees are "much better" than  they were even a month ago.  At one point he had considered weight loss surgery but with his recent diet and weight loss he has postponed any consideration of surgery HPI  Review of Systems  Constitutional: Negative for fatigue.  HENT: Negative for ear pain.   Eyes: Negative for pain.  Respiratory: Negative for shortness of breath.   Cardiovascular: Negative for leg swelling.  Gastrointestinal: Negative for constipation and diarrhea.  Endocrine: Negative for cold intolerance and heat intolerance.  Genitourinary: Negative for difficulty urinating.  Musculoskeletal: Positive for joint swelling.  Skin: Negative for rash.  Allergic/Immunologic: Positive for food  allergies.  Neurological: Negative for weakness.  Hematological: Does not bruise/bleed easily.  Psychiatric/Behavioral: Positive for sleep disturbance.     Objective: Vital Signs: BP 104/73   Pulse 90   Ht 5\' 11"  (1.803 m)   Wt 280 lb (127 kg)   BMI 39.05 kg/m   Physical Exam Constitutional:      Appearance: He is well-developed.  Eyes:     Pupils: Pupils are equal, round, and reactive to light.  Pulmonary:     Effort: Pulmonary effort is normal.  Skin:    General: Skin is warm and dry.  Neurological:     Mental Status: He is alert and oriented to person, place, and time.  Psychiatric:        Behavior: Behavior normal.     Ortho Exam awake alert and oriented x3.  Comfortable sitting both knees have small effusions.  Lacks about 5 degrees to full extension bilaterally and flexes about 100 degrees.  Well-healed incisions medially on the right knee.  No instability.  Increased varus bilaterally more on the right than the left with weightbearing.  Good pulses distally.  Some mild altered sensibility in both feet but feet were warm.  No calf pain.  No popliteal masses.  No thigh pain.  Painless range of motion both hips  Specialty Comments:  No specialty comments available.  Imaging: Xr Knee 3 View Left  Result Date: 07/23/2018 Films of the left knee were obtained in several projections standing.  There appears to be a large loose bodies probably several centimeters in diameter on the superior pouch there are other smaller loose bodies in the popliteal space.  Advanced and end-stage tricompartmental degenerative arthritis with bone-on-bone particularly in the medial compartment..  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  Approximately 7 degrees of varus  Xr Knee 3 View Right  Result Date: 07/23/2018 Films of the right knee were obtained in several projections standing.  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  No acute changes.  Advanced end-stage osteoarthritis  in all 3 compartments with bone-on-bone in the medial compartment and approximately 15 degrees of varus    PMFS History: Patient Active Problem List   Diagnosis Date Noted  . Neuropathy 11/16/2017  . Prediabetes 11/16/2017  . Vitamin D deficiency 11/16/2017  . Hemochromatosis, hereditary ([place]) 07/23/2017  . Bleeding nose 05/18/2017  . Allergic rhinitis 01/26/2017  . Hematuria 10/26/2016  . Blue nevus 05/16/2016  . Hair loss 05/16/2016  . Abdominal pain 05/16/2016  . Umbilical hernia without obstruction and without gangrene 05/16/2016  . Atrial fibrillation ([place]) 09/26/2014  . Morbid obesity ([place]) 09/26/2014  . Chronic diastolic congestive heart failure ([place]) 09/26/2014  . Anxiety 09/26/2014  . Obstructive sleep apnea on CPAP 09/26/2014  . Essential hypertension 09/26/2014  . Hyperlipidemia 09/26/2014  . Osteoarthritis of both knees 09/26/2014  . Kidney stone 09/26/2014   Past  Medical History:  Diagnosis Date  . Chronic a-fib   . Chronic systolic CHF (congestive heart failure) ([place])   . Depression   . Hemochromatosis   . History of kidney stones   . History of pulmonary embolism   . Hyperlipidemia   . Hypertension   . Migraine   . Obesity   . OSA (obstructive sleep apnea)    on CPAP  . Osteoarthritis    bilateral knees  . Rocky Mountain spotted fever 2011    Family History  Problem Relation Age of Onset  . Thyroid disease Mother   . Cancer Father        lung  . Arthritis Father   . Hypertension Father   . Cancer Paternal Grandfather        Throat cancer    Past Surgical History:  Procedure Laterality Date  . CARDIAC CATHETERIZATION  2012   [facility]  . KNEE SURGERY     right knee   . LITHOTRIPSY  07/31/2014  . URETERAL STENT PLACEMENT  2016  . URETEROSCOPY     Social History   Occupational History  . Not on file  Tobacco Use  . Smoking status: Never Smoker  . Smokeless tobacco: Never Used  Substance and Sexual Activity  . Alcohol use: No     Frequency: Never    Comment: occasional; 3 times a year  . Drug use: No  . Sexual activity: Not Currently

## 2018-07-24 ENCOUNTER — Ambulatory Visit (INDEPENDENT_AMBULATORY_CARE_PROVIDER_SITE_OTHER): Payer: Medicare HMO

## 2018-07-24 VITALS — BP 114/64 | HR 77 | Temp 98.2°F | Resp 16 | Ht 70.0 in | Wt 285.1 lb

## 2018-07-24 DIAGNOSIS — Z0000 Encounter for general adult medical examination without abnormal findings: Secondary | ICD-10-CM

## 2018-07-24 NOTE — Patient Instructions (Addendum)
  Carlos Crosby , Thank you for taking time to come for your Medicare Wellness Visit. I appreciate your ongoing commitment to your health goals. Please review the following plan we discussed and let me know if I can assist you in the future.   These are the goals we discussed: Goals      Patient Stated   . Weight (lb) < 220 lb (99.8 kg) (pt-stated)     Strength training exercises Cardio aerobic exercise       This is a list of the screening recommended for you and due dates:  Health Maintenance  Topic Date Due  . HIV Screening  01/19/1972  . Tetanus Vaccine  01/19/1976  . Cologuard (Stool DNA test)  06/22/2020  . Flu Shot  Completed  .  Hepatitis C: One time screening is recommended by Center for Disease Control  (CDC) for  adults born from [birth year] through 1964.   Completed

## 2018-07-24 NOTE — Progress Notes (Signed)
Subjective:   Carlos Crosby is a 63 y.o. male who presents for Medicare Annual/Subsequent preventive examination.  Review of Systems:  No ROS.  Medicare Wellness Visit. Additional risk factors are reflected in the social history. Cardiac Risk Factors include: advanced age (>19men, >48 women);male gender;hypertension     Objective:    Vitals: BP 114/64 (BP Location: Left Arm, Patient Position: Sitting, Cuff Size: Normal)   Pulse 77   Temp 98.2 F (36.8 C) (Oral)   Resp 16   Ht 5\' 10"  (1.778 m)   Wt 285 lb 1.9 oz (129.3 kg)   BMI 40.91 kg/m   Body mass index is 40.91 kg/m.  Advanced Directives 07/24/2018 04/03/2018 10/25/2017 08/30/2017 07/25/2017 07/21/2017 06/08/2017  Does Patient Have a Medical Advance Directive? No No No No No No No  Would patient like information on creating a medical advance directive? No - Patient declined No - Patient declined No - Patient declined No - Patient declined No - Patient declined Yes (MAU/Ambulatory/Procedural Areas - Information given) Yes (MAU/Ambulatory/Procedural Areas - Information given)    Tobacco Social History   Tobacco Use  Smoking Status Never Smoker  Smokeless Tobacco Never Used     Counseling given: Not Answered   Clinical Intake:  Pre-visit preparation completed: Yes  Pain : 0-10 Pain Score: 3  Pain Type: Chronic pain Pain Location: Knee     Diabetes: No(Prediabetes)  How often do you need to have someone help you when you read instructions, pamphlets, or other written materials from your doctor or pharmacy?: 1 - Never  Interpreter Needed?: No     Past Medical History:  Diagnosis Date  . Chronic a-fib   . Chronic systolic CHF (congestive heart failure) ([place])   . Depression   . Hemochromatosis   . History of kidney stones   . History of pulmonary embolism   . Hyperlipidemia   . Hypertension   . Migraine   . Obesity   . OSA (obstructive sleep apnea)    on CPAP  . Osteoarthritis    bilateral knees  .  Rocky Mountain spotted fever 2011   Past Surgical History:  Procedure Laterality Date  . CARDIAC CATHETERIZATION  2012   [facility]  . KNEE SURGERY     right knee   . LITHOTRIPSY  07/31/2014  . URETERAL STENT PLACEMENT  2016  . URETEROSCOPY     Family History  Problem Relation Age of Onset  . Thyroid disease Mother   . Cancer Father        lung  . Arthritis Father   . Hypertension Father   . Cancer Paternal Grandfather        Throat cancer   Social History   Socioeconomic History  . Marital status: Married    Spouse name: Not on file  . Number of children: Not on file  . Years of education: Not on file  . Highest education level: Not on file  Occupational History  . Not on file  Social Needs  . Financial resource strain: Not very hard  . Food insecurity:    Worry: Never true    Inability: Never true  . Transportation needs:    Medical: No    Non-medical: No  Tobacco Use  . Smoking status: Never Smoker  . Smokeless tobacco: Never Used  Substance and Sexual Activity  . Alcohol use: No    Frequency: Never    Comment: occasional; 3 times a year  . Drug use: No  .  Sexual activity: Not Currently  Lifestyle  . Physical activity:    Days per week: 3 days    Minutes per session: 90 min  . Stress: Not at all  Relationships  . Social connections:    Talks on phone: Not on file    Gets together: Not on file    Attends religious service: Not on file    Active member of club or organization: Not on file    Attends meetings of clubs or organizations: Not on file    Relationship status: Not on file  Other Topics Concern  . Not on file  Social History Narrative  . Not on file    Outpatient Encounter Medications as of 07/24/2018  Medication Sig  . acetaminophen (TYLENOL) 500 MG tablet Take 1,000 mg by mouth every 6 (six) hours as needed.  Marland Kitchen apixaban (ELIQUIS) 5 MG TABS tablet Take 1 tablet (5 mg total) by mouth 2 (two) times daily.  . Cholecalciferol (VITAMIN D) 2000  units CAPS   . digoxin (LANOXIN) 0.125 MG tablet Take 1 tablet (0.125 mg) by mouth six days a week  . furosemide (LASIX) 40 MG tablet Take 40 mg by mouth daily as needed.   Marland Kitchen lisinopril (PRINIVIL,ZESTRIL) 2.5 MG tablet Take 1 tablet (2.5 mg total) by mouth daily.  Marland Kitchen loratadine (CLARITIN) 10 MG tablet Take 10 mg by mouth daily.  . metoprolol succinate (TOPROL-XL) 100 MG 24 hr tablet TAKE 2 TABLETS EVERY MORNING  AND TAKE 1 TABLET EVERY EVENING  . Multiple Vitamins-Minerals (CENTRUM SILVER ADULT 50+) TABS Take 1 tablet by mouth daily.  . Potassium 95 MG TABS Take 1 tablet by mouth.  . pravastatin (PRAVACHOL) 80 MG tablet TAKE 1 TABLET EVERY EVENING  . traMADol (ULTRAM) 50 MG tablet Take 1 tablet (50 mg total) by mouth every 6 (six) hours as needed.  . verapamil (CALAN-SR) 120 MG CR tablet TAKE 1 TABLET (120 MG TOTAL) BY MOUTH DAILY.   No facility-administered encounter medications on file as of 07/24/2018.     Activities of Daily Living In your present state of health, do you have any difficulty performing the following activities: 07/24/2018  Hearing? N  Vision? N  Difficulty concentrating or making decisions? N  Walking or climbing stairs? N  Dressing or bathing? N  Doing errands, shopping? N  Preparing Food and eating ? N  Using the Toilet? N  In the past six months, have you accidently leaked urine? N  Do you have problems with loss of bowel control? N  Managing your Medications? N  Managing your Finances? N  Housekeeping or managing your Housekeeping? N  Some recent data might be hidden    Patient Care Team: Carlos Haven, MD as PCP - General (Family Medicine)   Assessment:   This is a routine wellness examination for Carlos Crosby.  Health Screenings  Glaucoma -none Hearing -demonstrates normal hearing during conversation Hemoglobin A1C-05/18/18 (5.0) Direct LDL- 05/18/18 (91.0) Digoxin level -06/08/18 (0.8) Dental-dentures Vision-every 12 months  Social  Alcohol intake  -no Smoking history- -no Smokers in home?none Illicit drug use? none Exercise -swimming 3 days weekly,. 90 minutes Diet- low carb, monitors closely Sexually Active -not currently Multiple Partners- no  Safety  Patient feels safe at home.  Patient does have smoke detectors at home  Patient does wear sunscreen or protective clothing when in direct sunlight  Patient does wear seat belt when driving or riding with others.   Activities of Daily Living Patient can do their  own household chores. Denies needing assistance with: driving, feeding themselves, getting from bed to chair, getting to the toilet, bathing/showering, dressing, managing money, climbing flight of stairs, or preparing meals. Cane in use as needed when ambulating.   Depression Screen Patient denies losing interest in daily life, feeling hopeless, or crying easily over simple problems.   Fall Screen Patient denies being afraid of falling or falling in the last year.   Memory Screen Patient denies problems with memory, misplacing items, and is able to balance checkbook/bank accounts.  Patient is alert, normal appearance, oriented to person/place/and time. Correctly identified the president of the [place], recall of 3/3 objects, and performing simple calculations.  Patient displays appropriate judgement and can read correct time from watch face.   Immunizations The following Immunizations are up to date: Influenza and pneumonia. Shingles and tetanus discussed.   Other Providers Patient Care Team: Carlos Haven, MD as PCP - General (Family Medicine)  Exercise Activities and Dietary recommendations Current Exercise Habits: Home exercise routine, Time (Minutes): 60, Frequency (Times/Week): 3, Weekly Exercise (Minutes/Week): 180, Intensity: Mild  Goals      Patient Stated   . Weight (lb) < 220 lb (99.8 kg) (pt-stated)     Strength training exercises Cardio aerobic exercise       Fall Risk Fall Risk  07/24/2018  05/08/2018 11/06/2017 10/09/2017 08/07/2017  Falls in the past year? 0 0 No No No  Risk for fall due to : - - - - -   Depression Screen PHQ 2/9 Scores 07/24/2018 11/06/2017 08/07/2017 07/21/2017  PHQ - 2 Score 0 0 0 0    Cognitive Function MMSE - Mini Mental State Exam 07/24/2018 07/21/2017 07/20/2016  Orientation to time 5 5 5   Orientation to Place 5 5 5   Registration 3 3 3   Attention/ Calculation 5 5 5   Recall 3 3 2   Recall-comments - - 2 out of 3 words recalled  Language- name 2 objects 2 2 2   Language- repeat 1 1 1   Language- follow 3 step command 3 3 3   Language- read & follow direction 1 1 1   Write a sentence 1 1 1   Copy design 1 1 1   Total score 30 30 29         Immunization History  Administered Date(s) Administered  . Influenza,inj,Quad PF,6+ Mos 03/20/2013, 02/27/2015, 01/26/2017  . Influenza-Unspecified 03/08/2014, 02/26/2016  . Pneumococcal Polysaccharide-23 05/06/2015   Screening Tests Health Maintenance  Topic Date Due  . HIV Screening  01/19/1972  . TETANUS/TDAP  01/19/1976  . Fecal DNA (Cologuard)  06/22/2020  . INFLUENZA VACCINE  Completed  . Hepatitis C Screening  Completed       Plan:    End of life planning; Advanced aging; Advanced directives discussed.  No HCPOA/Living Will.  Additional information declined at this time.  Keep all routine maintenance appointments.   I have personally reviewed and noted the following in the patient's chart:   . Medical and social history . Use of alcohol, tobacco or illicit drugs  . Current medications and supplements . Functional ability and status . Nutritional status . Physical activity . Advanced directives . List of other physicians . Hospitalizations, surgeries, and ER visits in previous 12 months . Vitals . Screenings to include cognitive, depression, and falls . Referrals and appointments  In addition, I have reviewed and discussed with patient certain preventive protocols, quality metrics, and best practice  recommendations. A written personalized care plan for preventive services as well as general preventive  health recommendations were provided to patient.     Varney Biles, LPN  07/24/2881

## 2018-07-25 NOTE — Telephone Encounter (Signed)
Noted  

## 2018-07-26 NOTE — Progress Notes (Signed)
No acute issues. Will follow with PCP

## 2018-07-27 ENCOUNTER — Telehealth (INDEPENDENT_AMBULATORY_CARE_PROVIDER_SITE_OTHER): Payer: Self-pay

## 2018-07-27 NOTE — Telephone Encounter (Signed)
Submitted VOB for Orthovisc, bilateral knee.  

## 2018-08-02 ENCOUNTER — Encounter (INDEPENDENT_AMBULATORY_CARE_PROVIDER_SITE_OTHER): Payer: Self-pay | Admitting: Orthopaedic Surgery

## 2018-08-06 NOTE — Progress Notes (Signed)
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  INTERVAL HISTORY: Patient returns to clinic today for repeat laboratory work, further evaluation, and consideration of additional phlebotomy.  He continues to have intentional weight loss and has lost over 70 pounds.  He currently feels well and is asymptomatic. He has no neurologic complaints.  He denies any recent fevers or illnesses.  He has a good appetite and denies weight loss.  He has no chest pain or shortness of breath.  He denies any nausea, vomiting, constipation, or diarrhea.  He has no urinary complaints.  Patient offers no specific complaints today.  REVIEW OF SYSTEMS:   Review of Systems  Constitutional: Positive for weight loss. Negative for fever and malaise/fatigue.  Respiratory: Negative.  Negative for cough, hemoptysis and shortness of breath.   Cardiovascular: Negative.  Negative for chest pain and leg swelling.  Gastrointestinal: Negative.  Negative for abdominal pain, blood in stool and melena.  Genitourinary: Negative.  Negative for hematuria.  Musculoskeletal: Negative.  Negative for back pain.  Skin: Negative.  Negative for rash.  Neurological: Negative.  Negative for sensory change, focal weakness, weakness and headaches.  Psychiatric/Behavioral: Negative.  The patient is not nervous/anxious.     As per HPI. Otherwise, a complete review of systems is negative.  PAST MEDICAL HISTORY: Past Medical History:  Diagnosis Date  . Chronic a-fib   . Chronic systolic CHF (congestive heart failure) ([place])   . Depression   . Hemochromatosis   . History of kidney stones   . History of pulmonary embolism   . Hyperlipidemia   . Hypertension   . Migraine   .  Obesity   . OSA (obstructive sleep apnea)    on CPAP  . Osteoarthritis    bilateral knees  . Rocky Mountain spotted fever 2011    PAST SURGICAL HISTORY: Past Surgical History:  Procedure Laterality Date  . CARDIAC CATHETERIZATION  2012   [facility]  . KNEE SURGERY     right knee   . LITHOTRIPSY  07/31/2014  . URETERAL STENT PLACEMENT  2016  . URETEROSCOPY      FAMILY HISTORY: Family History  Problem Relation Age of Onset  . Thyroid disease Mother   . Cancer Father        lung  . Arthritis Father   . Hypertension Father   . Cancer Paternal Grandfather        Throat cancer    ADVANCED DIRECTIVES (Y/N):  N  HEALTH MAINTENANCE: Social History   Tobacco Use  . Smoking status: Never Smoker  . Smokeless tobacco: Never Used  Substance Use Topics  . Alcohol use: No    Frequency: Never    Comment: occasional; 3 times a year  . Drug use: No     Colonoscopy:  PAP:  Bone density:  Lipid panel:  Allergies  Allergen Reactions  . Allopurinol Hives and Swelling  . Other Hives  . Diltiazem Itching and Rash  . Penicillin G Nausea Only    And dirrhea  . Ondansetron Rash  . Ondansetron Hcl Rash  . Penicillins Other (See Comments) and Nausea Only    Pt unsure of rxn; showed up on allergy test Pt unsure of rxn; showed up on allergy test Pt unsure of rxn; showed up  on allergy test And dirrhea     Current Outpatient Medications  Medication Sig Dispense Refill  . acetaminophen (TYLENOL) 500 MG tablet Take 1,000 mg by mouth every 6 (six) hours as needed.    Marland Kitchen apixaban (ELIQUIS) 5 MG TABS tablet Take 1 tablet (5 mg total) by mouth 2 (two) times daily. 180 tablet 1  . Cholecalciferol (VITAMIN D) 2000 units CAPS     . digoxin (LANOXIN) 0.125 MG tablet Take 1 tablet (0.125 mg) by mouth six days a week 90 tablet 3  . lisinopril (PRINIVIL,ZESTRIL) 2.5 MG tablet Take 1 tablet (2.5 mg total) by mouth daily. 90 tablet 1  . loratadine (CLARITIN) 10 MG tablet Take 10 mg by mouth daily.     . metoprolol succinate (TOPROL-XL) 100 MG 24 hr tablet TAKE 2 TABLETS EVERY MORNING  AND TAKE 1 TABLET EVERY EVENING 270 tablet 3  . Multiple Vitamins-Minerals (CENTRUM SILVER ADULT 50+) TABS Take 1 tablet by mouth daily.    . Potassium 95 MG TABS Take 1 tablet by mouth.    . pravastatin (PRAVACHOL) 80 MG tablet TAKE 1 TABLET EVERY EVENING 90 tablet 2  . verapamil (CALAN-SR) 120 MG CR tablet TAKE 1 TABLET (120 MG TOTAL) BY MOUTH DAILY. 90 tablet 0  . furosemide (LASIX) 40 MG tablet Take 40 mg by mouth daily as needed.     . traMADol (ULTRAM) 50 MG tablet Take 1 tablet (50 mg total) by mouth every 6 (six) hours as needed. (Patient not taking: Reported on 08/09/2018) 90 tablet 0   No current facility-administered medications for this visit.     OBJECTIVE: Vitals:   08/09/18 1409  BP: 129/88  Pulse: 78  Temp: 97.8 F (36.6 C)     Body mass index is 39.61 kg/m.    ECOG FS:0 - Asymptomatic  General: Well-developed, well-nourished, no acute distress. Eyes: Pink conjunctiva, anicteric sclera. HEENT: Normocephalic, moist mucous membranes. Lungs: Clear to auscultation bilaterally. Heart: Regular rate and rhythm. No rubs, murmurs, or gallops. Abdomen: Soft, nontender, nondistended. No organomegaly noted, normoactive bowel sounds. Musculoskeletal: No edema, cyanosis, or clubbing. Neuro: Alert, answering all questions appropriately. Cranial nerves grossly intact. Skin: No rashes or petechiae noted. Psych: Normal affect.  LAB RESULTS:  Lab Results  Component Value Date   NA 138 05/18/2018   K 4.4 05/18/2018   CL 101 05/18/2018   CO2 23 05/18/2018   GLUCOSE 94 05/18/2018   BUN 16 05/18/2018   CREATININE 0.87 05/18/2018   CALCIUM 9.7 05/18/2018   PROT 7.3 05/18/2018   ALBUMIN 4.7 05/18/2018   AST 20 05/18/2018   ALT 19 05/18/2018   ALKPHOS 36 (L) 05/18/2018   BILITOT 0.9 05/18/2018   GFRNONAA >60 03/30/2017   GFRAA >60 03/30/2017    Lab Results  Component Value Date   WBC  8.4 08/08/2018   NEUTROABS 5.2 08/08/2018   HGB 15.8 08/08/2018   HCT 46.0 08/08/2018   MCV 95.4 08/08/2018   PLT 206 08/08/2018   Lab Results  Component Value Date   IRON 120 08/08/2018   TIBC 353 08/08/2018   IRONPCTSAT 34 08/08/2018   Lab Results  Component Value Date   FERRITIN 81 08/08/2018     STUDIES: Xr Knee 3 View Left  Result Date: 07/23/2018 Films of the left knee were obtained in several projections standing.  There appears to be a large loose bodies probably several centimeters in diameter on the superior pouch there are other smaller loose bodies in the popliteal space.  Advanced and end-stage tricompartmental degenerative arthritis with bone-on-bone particularly in the medial compartment..  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  Approximately 7 degrees of varus  Xr Knee 3 View Right  Result Date: 07/23/2018 Films of the right knee were obtained in several projections standing.  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  No acute changes.  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  The remainder of his laboratory work is either negative or within normal limits.  He does not require phlebotomy today. Because patient is on Eliquis, he cannot receive his phlebotomy through [facility].  Return to clinic in 3 months with repeat laboratory work, further evaluation, and consideration of additional phlebotomy. 2.  Weight loss: Intentional.  Patient states he does not require gastric bypass at this time.  I spent a total of 20 minutes face-to-face with the patient of which greater than 50% of the visit was spent in counseling and  coordination of care as detailed above.  Patient expressed understanding and was in agreement with this plan. He also understands that He can call clinic at any time with any questions, concerns, or complaints.    Lloyd Huger, MD   08/11/2018 6:55 AM

## 2018-08-08 ENCOUNTER — Other Ambulatory Visit: Payer: Self-pay

## 2018-08-08 ENCOUNTER — Inpatient Hospital Stay: Payer: Medicare HMO | Attending: Oncology

## 2018-08-08 LAB — CBC WITH DIFFERENTIAL/PLATELET
Abs Immature Granulocytes: 0.03 10*3/uL (ref 0.00–0.07)
Basophils Absolute: 0.1 10*3/uL (ref 0.0–0.1)
Basophils Relative: 1 %
Eosinophils Absolute: 0.2 10*3/uL (ref 0.0–0.5)
Eosinophils Relative: 2 %
HCT: 46 % (ref 39.0–52.0)
Hemoglobin: 15.8 g/dL (ref 13.0–17.0)
Immature Granulocytes: 0 %
Lymphocytes Relative: 29 %
Lymphs Abs: 2.4 10*3/uL (ref 0.7–4.0)
MCH: 32.8 pg (ref 26.0–34.0)
MCHC: 34.3 g/dL (ref 30.0–36.0)
MCV: 95.4 fL (ref 80.0–100.0)
Monocytes Absolute: 0.5 10*3/uL (ref 0.1–1.0)
Monocytes Relative: 6 %
Neutro Abs: 5.2 10*3/uL (ref 1.7–7.7)
Neutrophils Relative %: 62 %
Platelets: 206 10*3/uL (ref 150–400)
RBC: 4.82 MIL/uL (ref 4.22–5.81)
RDW: 13.2 % (ref 11.5–15.5)
WBC: 8.4 10*3/uL (ref 4.0–10.5)
nRBC: 0 % (ref 0.0–0.2)

## 2018-08-08 LAB — IRON AND TIBC
Iron: 120 ug/dL (ref 45–182)
Saturation Ratios: 34 % (ref 17.9–39.5)
TIBC: 353 ug/dL (ref 250–450)
UIBC: 233 ug/dL

## 2018-08-08 LAB — FERRITIN: Ferritin: 81 ng/mL (ref 24–336)

## 2018-08-09 ENCOUNTER — Inpatient Hospital Stay: Payer: Medicare HMO

## 2018-08-09 ENCOUNTER — Encounter: Payer: Self-pay | Admitting: Oncology

## 2018-08-09 ENCOUNTER — Other Ambulatory Visit: Payer: Self-pay

## 2018-08-09 ENCOUNTER — Inpatient Hospital Stay: Payer: Medicare HMO | Attending: Oncology | Admitting: Oncology

## 2018-08-09 NOTE — Progress Notes (Signed)
Patient is here today to follow up on his hemochromatosis, hereditary. Patient stated that she had been doing well.

## 2018-08-11 ENCOUNTER — Other Ambulatory Visit: Payer: Self-pay | Admitting: Internal Medicine

## 2018-08-15 ENCOUNTER — Other Ambulatory Visit: Payer: Self-pay | Admitting: Family Medicine

## 2018-08-15 NOTE — Telephone Encounter (Signed)
Last OV 05/18/2018   Last refilled 07/09/2018 disp 90 with no refills   Sent to PCP for approval

## 2018-08-19 ENCOUNTER — Other Ambulatory Visit: Payer: Self-pay | Admitting: Internal Medicine

## 2018-08-20 ENCOUNTER — Telehealth (INDEPENDENT_AMBULATORY_CARE_PROVIDER_SITE_OTHER): Payer: Self-pay

## 2018-08-20 NOTE — Telephone Encounter (Signed)
Resubmitted VOB for Orthovisc, bilateral knee per Abigail Butts M.due to having clarification on provider being in network.

## 2018-08-21 ENCOUNTER — Telehealth (INDEPENDENT_AMBULATORY_CARE_PROVIDER_SITE_OTHER): Payer: Self-pay

## 2018-08-21 NOTE — Telephone Encounter (Signed)
Please schedule patient an appointment with Dr. Durward Fortes for gel injection.  Thank you.  Approved for Orthovisc series, bilateral knee. [place] Patient will be responsible for 20% of the allowable amount. Co-pay of $45.00 each visit No PA required

## 2018-08-22 ENCOUNTER — Other Ambulatory Visit: Payer: Self-pay | Admitting: Cardiovascular Disease

## 2018-08-22 NOTE — Telephone Encounter (Signed)
LMOM for patient to call and schedule Orthovisc injections 

## 2018-09-10 ENCOUNTER — Telehealth: Payer: Self-pay

## 2018-09-10 NOTE — Telephone Encounter (Signed)
Pt called to see if he needs to come into the office for his appt.  Pt is ok with changing his upcoming appt w/ Dr. Caryl Bis to a virtual visit.  Pt has a smart phone and computer.  Please call pt to set up Webex.  CB#:  [phone]

## 2018-09-18 ENCOUNTER — Telehealth: Payer: Self-pay

## 2018-09-18 NOTE — Telephone Encounter (Signed)
Virtual Visit Pre-Appointment Phone Call    Confirm consent - "In the setting of the current Covid19 crisis, you are scheduled for a (phone or video) visit with your provider on (date) at (time).  Just as we do with many in-office visits, in order for you to participate in this visit, we must obtain consent.  If you'd like, I can send this to your mychart (if signed up) or email for you to review.  Otherwise, I can obtain your verbal consent now.  All virtual visits are billed to your insurance company just like a normal visit would be.  By agreeing to a virtual visit, we'd like you to understand that the technology does not allow for your provider to perform an examination, and thus may limit your provider's ability to fully assess your condition.  Finally, though the technology is pretty good, we cannot assure that it will always work on either your or our end, and in the setting of a video visit, we may have to convert it to a phone-only visit.  In either situation, we cannot ensure that we have a secure connection.  Are you willing to proceed?Y ES  1. Confirm the BEST phone number to call the day of the visit by including in appointment notes  2.  Doximity/Doxy.me if video visit (depending on what platform provider is using)  3. Advise patient to be prepared with their blood pressure, heart rate, weight, any heart rhythm information, their current medicines, and a piece of paper and pen handy for any instructions they may receive the day of their visit  4. Inform patient they will receive a phone call 15 minutes prior to their appointment time (may be from unknown caller ID) so they should be prepared to answer  5. Confirm that appointment type is correct in Epic appointment notes (VIDEO vs PHONE)     TELEPHONE CALL NOTE  Carlos Crosby has been deemed a candidate for a follow-up tele-health visit to limit community exposure during the Covid-19 pandemic. I spoke with the patient via  phone to ensure availability of phone/video source, confirm preferred email & phone number, and discuss instructions and expectations.  I reminded Carlos Crosby to be prepared with any vital sign and/or heart rhythm information that could potentially be obtained via home monitoring, at the time of his visit. I reminded Carlos Crosby to expect a phone call at the time of his visit if his visit.  Alba Destine, RMA 09/18/2018 4:59 PM   IF USING DOXIMITY or DOXY.ME - The patient will receive a link just prior to their visit, either by text or email (to be determined day of appointment depending on if it's doxy.me or Doximity).     FULL LENGTH CONSENT FOR TELE-HEALTH VISIT   I hereby voluntarily request, consent and authorize [facility] and its employed or contracted physicians, physician assistants, nurse practitioners or other licensed health care professionals (the Practitioner), to provide me with telemedicine health care services (the "Services") as deemed necessary by the treating Practitioner. I acknowledge and consent to receive the Services by the Practitioner via telemedicine. I understand that the telemedicine visit will involve communicating with the Practitioner through live audiovisual communication technology and the disclosure of certain medical information by electronic transmission. I acknowledge that I have been given the opportunity to request an in-person assessment or other available alternative prior to the telemedicine visit and am voluntarily participating in the telemedicine visit.  I understand that  I have the right to withhold or withdraw my consent to the use of telemedicine in the course of my care at any time, without affecting my right to future care or treatment, and that the Practitioner or I may terminate the telemedicine visit at any time. I understand that I have the right to inspect all information obtained and/or recorded in the course of the telemedicine  visit and may receive copies of available information for a reasonable fee.  I understand that some of the potential risks of receiving the Services via telemedicine include:  Marland Kitchen Delay or interruption in medical evaluation due to technological equipment failure or disruption; . Information transmitted may not be sufficient (e.g. poor resolution of images) to allow for appropriate medical decision making by the Practitioner; and/or  . In rare instances, security protocols could fail, causing a breach of personal health information.  Furthermore, I acknowledge that it is my responsibility to provide information about my medical history, conditions and care that is complete and accurate to the best of my ability. I acknowledge that Practitioner's advice, recommendations, and/or decision may be based on factors not within their control, such as incomplete or inaccurate data provided by me or distortions of diagnostic images or specimens that may result from electronic transmissions. I understand that the practice of medicine is not an exact science and that Practitioner makes no warranties or guarantees regarding treatment outcomes. I acknowledge that I will receive a copy of this consent concurrently upon execution via email to the email address I last provided but may also request a printed copy by calling the office of [facility].    I understand that my insurance will be billed for this visit.   I have read or had this consent read to me. . I understand the contents of this consent, which adequately explains the benefits and risks of the Services being provided via telemedicine.  . I have been provided ample opportunity to ask questions regarding this consent and the Services and have had my questions answered to my satisfaction. . I give my informed consent for the services to be provided through the use of telemedicine in my medical care  By participating in this telemedicine visit I agree to the  above.

## 2018-09-24 ENCOUNTER — Encounter: Payer: Self-pay | Admitting: Family Medicine

## 2018-09-24 ENCOUNTER — Other Ambulatory Visit: Payer: Self-pay

## 2018-09-24 ENCOUNTER — Ambulatory Visit (INDEPENDENT_AMBULATORY_CARE_PROVIDER_SITE_OTHER): Payer: Medicare HMO | Admitting: Family Medicine

## 2018-09-24 ENCOUNTER — Telehealth: Payer: Self-pay | Admitting: Family Medicine

## 2018-09-24 DIAGNOSIS — E7800 Pure hypercholesterolemia, unspecified: Secondary | ICD-10-CM | POA: Diagnosis not present

## 2018-09-24 DIAGNOSIS — R7303 Prediabetes: Secondary | ICD-10-CM

## 2018-09-24 DIAGNOSIS — G629 Polyneuropathy, unspecified: Secondary | ICD-10-CM | POA: Diagnosis not present

## 2018-09-24 DIAGNOSIS — M170 Bilateral primary osteoarthritis of knee: Secondary | ICD-10-CM | POA: Diagnosis not present

## 2018-09-24 MED ORDER — TRAMADOL HCL 50 MG PO TABS: ORAL_TABLET | ORAL | 0 refills | Status: DC

## 2018-09-24 NOTE — Assessment & Plan Note (Signed)
Most recent A1c in the normal range.  Encouraged continued weight loss with diet and exercise.

## 2018-09-24 NOTE — Assessment & Plan Note (Signed)
Continue pravastatin.  Encouraged continued diet and exercise.

## 2018-09-24 NOTE — Progress Notes (Signed)
Virtual Visit via video Note  This visit type was conducted due to national recommendations for restrictions regarding the COVID-19 pandemic (e.g. social distancing).  This format is felt to be most appropriate for this patient at this time.  All issues noted in this document were discussed and addressed.  No physical exam was performed (except for noted visual exam findings with Video Visits).   I connected with Carlos Crosby on 09/24/18 at 11:00 AM EDT by a video enabled telemedicine application and verified that I am speaking with the correct person using two identifiers. Location patient: home Location provider: work Persons participating in the virtual visit: patient, provider  I discussed the limitations, risks, security and privacy concerns of performing an evaluation and management service by telephone and the availability of in person appointments. I also discussed with the patient that there may be a patient responsible charge related to this service. The patient expressed understanding and agreed to proceed.  Reason for visit: follow-up  HPI: Chronic knee pain: Patient notes this is doing relatively well.  He got a cortisone injection 2 months ago and that helped significantly.  He is waiting on Synvisc shots until June.  He takes his tramadol to help with pain.  Mainly takes it at night.  There are days where he does not take it.  It does not make him drowsy.  No alcohol intake.  Hyperlipidemia: Taking pravastatin.  No chest pain, right upper quadrant pain, or myalgias.  Prediabetes: No polyuria or polydipsia.  Most recent A1c is actually normal at 5.0.  He has been eating very healthy with mostly protein and vegetables.  No bread or potatoes.  He was swimming and going to the gym though has not been able to do that given social distancing precautions with COVID-19 in the community.  He has been active around the house.  Neuropathy: Patient notes those symptoms have improved quite a bit  since he lost weight.  At times his fingertips will feel like he had been out in the snow and then run them under hot water though it is much improved.  No tingling.  Notes his feet will feel cold without socks on.  No numbness.   ROS: See pertinent positives and negatives per HPI.  Past Medical History:  Diagnosis Date  . Chronic a-fib   . Chronic systolic CHF (congestive heart failure) ([place])   . Depression   . Hemochromatosis   . History of kidney stones   . History of pulmonary embolism   . Hyperlipidemia   . Hypertension   . Migraine   . Obesity   . OSA (obstructive sleep apnea)    on CPAP  . Osteoarthritis    bilateral knees  . Rocky Mountain spotted fever 2011    Past Surgical History:  Procedure Laterality Date  . CARDIAC CATHETERIZATION  2012   [facility]  . KNEE SURGERY     right knee   . LITHOTRIPSY  07/31/2014  . URETERAL STENT PLACEMENT  2016  . URETEROSCOPY      Family History  Problem Relation Age of Onset  . Thyroid disease Mother   . Cancer Father        lung  . Arthritis Father   . Hypertension Father   . Cancer Paternal Grandfather        Throat cancer    SOCIAL HX: Non-smoker.   Current Outpatient Medications:  .  acetaminophen (TYLENOL) 500 MG tablet, Take 1,000 mg by mouth every 6 (six)  hours as needed., Disp: , Rfl:  .  apixaban (ELIQUIS) 5 MG TABS tablet, Take 1 tablet (5 mg total) by mouth 2 (two) times daily., Disp: 180 tablet, Rfl: 1 .  Cholecalciferol (VITAMIN D) 2000 units CAPS, , Disp: , Rfl:  .  digoxin (LANOXIN) 0.125 MG tablet, Take 1 tablet (0.125 mg) by mouth six days a week, Disp: 90 tablet, Rfl: 3 .  furosemide (LASIX) 40 MG tablet, Take 40 mg by mouth daily as needed. , Disp: , Rfl:  .  lisinopril (PRINIVIL,ZESTRIL) 2.5 MG tablet, Take 1 tablet (2.5 mg total) by mouth daily. Please keep upcoming appt for future refills. Thank you, Disp: 90 tablet, Rfl: 0 .  loratadine (CLARITIN) 10 MG tablet, Take 10 mg by mouth daily., Disp: , Rfl:   .  metoprolol succinate (TOPROL-XL) 100 MG 24 hr tablet, TAKE 2 TABLETS EVERY MORNING AND TAKE 1 TABLET EVERY EVENING, Disp: 270 tablet, Rfl: 0 .  Multiple Vitamins-Minerals (CENTRUM SILVER ADULT 50+) TABS, Take 1 tablet by mouth daily., Disp: , Rfl:  .  Potassium 95 MG TABS, Take 1 tablet by mouth., Disp: , Rfl:  .  pravastatin (PRAVACHOL) 80 MG tablet, TAKE 1 TABLET EVERY EVENING, Disp: 90 tablet, Rfl: 2 .  traMADol (ULTRAM) 50 MG tablet, TAKE 1 TABLET(50 MG) BY MOUTH EVERY 6 HOURS AS NEEDED, Disp: 90 tablet, Rfl: 0 .  verapamil (CALAN-SR) 120 MG CR tablet, TAKE 1 TABLET EVERY DAY, Disp: 90 tablet, Rfl: 3  EXAM:  VITALS per patient if applicable: None.  GENERAL: alert, oriented, appears well and in no acute distress  HEENT: atraumatic, conjunttiva clear, no obvious abnormalities on inspection of external nose and ears  NECK: normal movements of the head and neck  LUNGS: on inspection no signs of respiratory distress, breathing rate appears normal, no obvious gross SOB, gasping or wheezing  CV: no obvious cyanosis  MS: moves all visible extremities without noticeable abnormality  PSYCH/NEURO: pleasant and cooperative, no obvious depression or anxiety, speech and thought processing grossly intact  ASSESSMENT AND PLAN:  Discussed the following assessment and plan:  Neuropathy  Osteoarthritis of both knees, unspecified osteoarthritis type  Pure hypercholesterolemia  Prediabetes  Neuropathy Much improved.  We will continue to work on weight loss.  He will monitor his symptoms.  Osteoarthritis of both knees Tramadol to be refilled.  He will continue to see orthopedics.  Hyperlipidemia Continue pravastatin.  Encouraged continued diet and exercise.  Prediabetes Most recent A1c in the normal range.  Encouraged continued weight loss with diet and exercise.    I discussed the assessment and treatment plan with the patient. The patient was provided an opportunity to ask  questions and all were answered. The patient agreed with the plan and demonstrated an understanding of the instructions.   The patient was advised to call back or seek an in-person evaluation if the symptoms worsen or if the condition fails to improve as anticipated.   [name], MD

## 2018-09-24 NOTE — Assessment & Plan Note (Signed)
Much improved.  We will continue to work on weight loss.  He will monitor his symptoms.

## 2018-09-24 NOTE — Telephone Encounter (Signed)
Called and spoke with pt. Pt has been scheduled for 4 month f/u 01/30/2019 @ 8:30 M

## 2018-09-24 NOTE — Assessment & Plan Note (Signed)
Tramadol to be refilled.  He will continue to see orthopedics.

## 2018-09-24 NOTE — Telephone Encounter (Signed)
Please contact the patient and get him set up for follow-up in the office in 4 months.  Thanks.

## 2018-09-28 ENCOUNTER — Other Ambulatory Visit: Payer: Self-pay | Admitting: Family Medicine

## 2018-10-04 ENCOUNTER — Telehealth (INDEPENDENT_AMBULATORY_CARE_PROVIDER_SITE_OTHER): Payer: Medicare HMO | Admitting: Internal Medicine

## 2018-10-04 ENCOUNTER — Other Ambulatory Visit: Payer: Self-pay

## 2018-10-04 VITALS — HR 85 | Ht 71.0 in | Wt 279.0 lb

## 2018-10-04 DIAGNOSIS — I4891 Unspecified atrial fibrillation: Secondary | ICD-10-CM

## 2018-10-04 DIAGNOSIS — I4819 Other persistent atrial fibrillation: Secondary | ICD-10-CM | POA: Diagnosis not present

## 2018-10-04 DIAGNOSIS — I5032 Chronic diastolic (congestive) heart failure: Secondary | ICD-10-CM

## 2018-10-04 DIAGNOSIS — Z79899 Other long term (current) drug therapy: Secondary | ICD-10-CM

## 2018-10-04 DIAGNOSIS — I10 Essential (primary) hypertension: Secondary | ICD-10-CM

## 2018-10-04 NOTE — Patient Instructions (Signed)
Medication Instructions:  - Your physician recommends that you continue on your current medications as directed. Please refer to the Current Medication list given to you today.  If you need a refill on your cardiac medications before your next appointment, please call your pharmacy.   Lab work: - Your physician recommends that you have lab work : Digoxin level (with next lab draw with hematology) - lab orders will be mailed to you.   If you have labs (blood work) drawn today and your tests are completely normal, you will receive your results only by: Marland Kitchen MyChart Message (if you have MyChart) OR . A paper copy in the mail If you have any lab test that is abnormal or we need to change your treatment, we will call you to review the results.  Testing/Procedures: - none ordered  Follow-Up: At [facility], you and your health needs are our priority.  As part of our continuing mission to provide you with exceptional heart care, we have created designated Provider Care Teams.  These Care Teams include your primary Cardiologist (physician) and Advanced Practice Providers (APPs -  Physician Assistants and Nurse Practitioners) who all work together to provide you with the care you need, when you need it.  . You will need a follow up appointment in 6 months with Dr. Caryl Comes .  Please call our office 2 months in advance to schedule this appointment.  (call in early August to schedule)   Any Other Special Instructions Will Be Listed Below (If Applicable). - N/A

## 2018-10-04 NOTE — Progress Notes (Signed)
Electrophysiology TeleHealth Note   Due to national recommendations of social distancing due to COVID 19, an audio/video telehealth visit is felt to be most appropriate for this patient at this time.  See MyChart message from today for the patient's consent to telehealth for [facility].   Date:  10/04/2018   ID:  Carlos Crosby, DOB 21-Jun-1955, MRN [id number]  Location: patient's home  Provider location: [address], [place]  Evaluation Performed: Follow-up visit  PCP:  Leone Haven, MD  Cardiologist:    Electrophysiologist:  SK   Chief Complaint: Atrial fibrillation-persistent  History of Present Illness:    Carlos Crosby is a 63 y.o. male who presents via audio/video conferencing for a telehealth visit today.  Since last being seen in our clinic for **persistent atrial fibrillation the patient reports *doing exceptionally well.  Has lost70 lbs with no carbs and  Exercise   He had injections of his knees; this decreased pain considerably.  He has now been approved for gel injections.  He is deferring this till after COVID.  Dyspnea is less.  Denies chest pain.  No peripheral edema.  Compliant with CPAP  DATE TEST EF   12/16    Echo     30-35 %   6/17    Echo   45-50 %   7/17 Myoview   42% No ischemia  10/18 Echo   40-45%   5/19 Echo  50-55%     DATE TEST Mean HR   2/16    Holter 93 (52-185)      6/17    Holter 75(38-132)         Date CR K Hgb Dig  1/17     1.1  8/18 0.99 4.6  0.5 (10/18)   1/19 0.90 4.6 16.7   12/19 0.87 4.4 15.8 1.6     The patient denies symptoms of fevers, chills, cough, or new SOB worrisome for COVID 19.    Past Medical History:  Diagnosis Date  . Chronic a-fib   . Chronic systolic CHF (congestive heart failure) ([place])   . Depression   . Hemochromatosis   . History of kidney stones   . History of pulmonary embolism   . Hyperlipidemia   . Hypertension   . Migraine   . Obesity   . OSA  (obstructive sleep apnea)    on CPAP  . Osteoarthritis    bilateral knees  . Rocky Mountain spotted fever 2011    Past Surgical History:  Procedure Laterality Date  . CARDIAC CATHETERIZATION  2012   [facility]  . KNEE SURGERY     right knee   . LITHOTRIPSY  07/31/2014  . URETERAL STENT PLACEMENT  2016  . URETEROSCOPY      Current Outpatient Medications  Medication Sig Dispense Refill  . acetaminophen (TYLENOL) 500 MG tablet Take 1,000 mg by mouth every 6 (six) hours as needed.    Marland Kitchen apixaban (ELIQUIS) 5 MG TABS tablet Take 1 tablet (5 mg total) by mouth 2 (two) times daily. 180 tablet 1  . Cholecalciferol (VITAMIN D) 2000 units CAPS     . digoxin (LANOXIN) 0.125 MG tablet Take 1 tablet (0.125 mg) by mouth six days a week 90 tablet 3  . lisinopril (PRINIVIL,ZESTRIL) 2.5 MG tablet Take 1 tablet (2.5 mg total) by mouth daily. Please keep upcoming appt for future refills. Thank you 90 tablet 0  . loratadine (CLARITIN) 10 MG tablet Take 10 mg  by mouth daily.    . metoprolol succinate (TOPROL-XL) 100 MG 24 hr tablet TAKE 2 TABLETS EVERY MORNING AND TAKE 1 TABLET EVERY EVENING 270 tablet 0  . Multiple Vitamins-Minerals (CENTRUM SILVER ADULT 50+) TABS Take 1 tablet by mouth daily.    . Potassium 95 MG TABS Take 1 tablet by mouth.    . pravastatin (PRAVACHOL) 80 MG tablet TAKE 1 TABLET EVERY EVENING 90 tablet 2  . traMADol (ULTRAM) 50 MG tablet TAKE 1 TABLET(50 MG) BY MOUTH EVERY 6 HOURS AS NEEDED 90 tablet 0  . verapamil (CALAN-SR) 120 MG CR tablet TAKE 1 TABLET EVERY DAY 90 tablet 3  . furosemide (LASIX) 40 MG tablet Take 40 mg by mouth daily as needed.      No current facility-administered medications for this visit.     Allergies:   Allopurinol; Other; Diltiazem; Penicillin g; Ondansetron; Ondansetron hcl; and Penicillins   Social History:  The patient  reports that he has never smoked. He has never used smokeless tobacco. He reports that he does not drink alcohol or use drugs.   Family  History:  The patient's   family history includes Arthritis in his father; Cancer in his father and paternal grandfather; Hypertension in his father; Thyroid disease in his mother.   ROS:  Please see the history of present illness.   All other systems are personally reviewed and negative.    Exam:    Vital Signs:  Pulse 85   Ht 5\' 11"  (1.803 m)   Wt 279 lb (126.6 kg)   BMI 38.91 kg/m     Well appearing, alert and conversant, regular work of breathing,  good skin color Eyes- anicteric, neuro- grossly intact, skin- no apparent rash or lesions or cyanosis, mouth- oral mucosa is pink   Labs/Other Tests and Data Reviewed:    Recent Labs: 11/16/2017: TSH 2.07 05/18/2018: ALT 19; BUN 16; Creatinine, Ser 0.87; Potassium 4.4; Sodium 138 08/08/2018: Hemoglobin 15.8; Platelets 206   Wt Readings from Last 3 Encounters:  10/04/18 279 lb (126.6 kg)  08/09/18 284 lb (128.8 kg)  07/24/18 285 lb 1.9 oz (129.3 kg)     Other studies personally reviewed: Additional studies/ records that were reviewed today include:   As above  Review of the above records today demonstrates:   Prior radiographs:       ASSESSMENT & PLAN:    Afib  now controlled ventricular response  Caradiomyopathy--presumed recurrent rate related with significant interval improvement as rate control has been accomplished  OSA  Morbidly obese  HTN  Hemochromatosis    Atrial fibrillation is permanent.  Heart rate control seems adequate by report and measurements.\  By description he is euvolemic.  Blood pressures at home have been better.  Has lost a tremendous amount of weight and is continuing to work on target of 200 pounds (started 360)  Last dig level was elevated.  Dose was decreased.  Needs reassessment.   COVID 19 screen The patient denies symptoms of COVID 19 at this time.  The importance of social distancing was discussed today.  Follow-up:  *6 months     Current medicines are reviewed at  length with the patient today.   The patient does not have concerns regarding his medicines.  The following changes were made today:  none  Labs/ tests ordered today include: DIG level in this order can be perhaps added to his epic orders.  He is scheduled for phlebotomy with hematology in mid June. No orders  of the defined types were placed in this encounter.   Future tests ( post COVID )  Dig   months  Patient Risk:  after full review of this patients clinical status, I feel that they are at moderate * risk at this time.  Today, I have spent *10* minutes with the patient with telehealth technology discussing the above.  Signed, Virl Axe, MD  10/04/2018 10:37 AM     [address] [name] [id number] [phone] (office) [phone] (fax)

## 2018-10-17 DIAGNOSIS — G4730 Sleep apnea, unspecified: Secondary | ICD-10-CM | POA: Diagnosis not present

## 2018-10-17 DIAGNOSIS — G4733 Obstructive sleep apnea (adult) (pediatric): Secondary | ICD-10-CM | POA: Diagnosis not present

## 2018-10-30 ENCOUNTER — Other Ambulatory Visit: Payer: Self-pay | Admitting: Internal Medicine

## 2018-11-04 ENCOUNTER — Other Ambulatory Visit: Payer: Self-pay | Admitting: Cardiovascular Disease

## 2018-11-05 ENCOUNTER — Ambulatory Visit: Payer: Self-pay | Admitting: Orthopaedic Surgery

## 2018-11-08 ENCOUNTER — Ambulatory Visit: Payer: Medicare HMO | Admitting: Orthopaedic Surgery

## 2018-11-08 ENCOUNTER — Other Ambulatory Visit: Payer: Self-pay

## 2018-11-08 DIAGNOSIS — M1711 Unilateral primary osteoarthritis, right knee: Secondary | ICD-10-CM | POA: Diagnosis not present

## 2018-11-08 DIAGNOSIS — M1712 Unilateral primary osteoarthritis, left knee: Secondary | ICD-10-CM

## 2018-11-08 MED ORDER — LIDOCAINE HCL 1 % IJ SOLN
2.0000 mL | INTRAMUSCULAR | Status: AC | PRN
  Administered 2018-11-08: 2 mL

## 2018-11-08 MED ORDER — HYALURONAN 30 MG/2ML IX SOSY
30.0000 mg | PREFILLED_SYRINGE | INTRA_ARTICULAR | Status: AC | PRN
  Administered 2018-11-08: 30 mg via INTRA_ARTICULAR

## 2018-11-08 NOTE — Progress Notes (Signed)
Office Visit Note   Patient: Carlos Crosby           Date of Birth: 1955/07/06           MRN: [id number] Visit Date: 11/08/2018              Requested by: Leone Haven, MD [address] [place], [place] [address] PCP: Leone Haven, MD   Assessment & Plan: Visit Diagnoses:  1. Unilateral primary osteoarthritis, left knee   2. Unilateral primary osteoarthritis, right knee     Plan:  #1:  Bilateral Orthovisc injections given without difficulty #2:  F/U in one week for 2nd injection bilaterally   Follow-Up Instructions: Return in about 1 week (around 11/15/2018).   Orders:  No orders of the defined types were placed in this encounter.  No orders of the defined types were placed in this encounter.     Procedures: Large Joint Inj: bilateral knee on 11/08/2018 1:39 PM Indications: pain and joint swelling Details: 25 G 1.5 in needle, anteromedial approach  Arthrogram: No  Medications (Right): 2 mL lidocaine 1 %; 30 mg Hyaluronan 30 MG/2ML Medications (Left): 2 mL lidocaine 1 %; 30 mg Hyaluronan 30 MG/2ML Outcome: tolerated well, no immediate complications Procedure, treatment alternatives, risks and benefits explained, specific risks discussed. Consent was given by the patient. Immediately prior to procedure a time out was called to verify the correct patient, procedure, equipment, support staff and site/side marked as required. Patient was prepped and draped in the usual sterile fashion.       Clinical Data: No additional findings.   Subjective: Chief Complaint  Patient presents with  . Left Knee - Pain    Started orthovisc bilaterally 11/08/18  . Right Knee - Pain    Started orthovisc bilaterally 11/08/18    HPI Carlos Crosby relates a long history of bilateral knee problems.  He has had multiple surgeries on his right knee dating back to when he was a teenager.  Over the years he has gained "considerable weight.  Since December he is lost 75 pounds  watching his calorie intake limiting it to 1500 cal.  He notes that his knees are actually feeling better.  He is aware as noted above that he has arthritis but wants to avoid surgery if possible because he is has a history of poorly controlled atrial fibrillation with a prior pulmonary emboli.  He is on Eliquis.  He has never been diagnosed with diabetes and relates is hemoglobin A1c is approximately 5 since he lost weight.  He does experience some tingling in his toes.  He presently is exercising and feels that his knees are "much better" than they were even a month ago.  At one point he had considered weight loss surgery but with his recent diet and weight loss he has postponed any consideration of total knee replacement.  Has failed conservative treatment but wants to stay conservative.  Review of Systems  Constitutional: Negative for fatigue.  HENT: Negative for ear pain.   Eyes: Negative for pain.  Respiratory: Negative for shortness of breath.   Cardiovascular: Negative for leg swelling.  Gastrointestinal: Negative for constipation and diarrhea.  Endocrine: Negative for cold intolerance and heat intolerance.  Genitourinary: Negative for difficulty urinating.  Musculoskeletal: Positive for joint swelling.  Skin: Negative for rash.  Allergic/Immunologic: Positive for food allergies.  Neurological: Negative for weakness.  Hematological: Does not bruise/bleed easily.  Psychiatric/Behavioral: Positive for sleep disturbance.  Objective: Vital Signs: There were no vitals taken for this visit.  Physical Exam Constitutional:      Appearance: He is well-developed.  Eyes:     Pupils: Pupils are equal, round, and reactive to light.  Pulmonary:     Effort: Pulmonary effort is normal.  Skin:    General: Skin is warm and dry.  Neurological:     Mental Status: He is alert and oriented to person, place, and time.  Psychiatric:        Behavior: Behavior normal.     Ortho Exam  ROM  5-100 degrees bilaterally.  Mild effusion.  Varus bilaterally worse on right. Incision on right knee medial parapatellar. Skin intact. N-V intact  Specialty Comments:  No specialty comments available.  Imaging: No results found.   PMFS History: Current Outpatient Medications  Medication Sig Dispense Refill  . acetaminophen (TYLENOL) 500 MG tablet Take 1,000 mg by mouth every 6 (six) hours as needed.    Marland Kitchen apixaban (ELIQUIS) 5 MG TABS tablet Take 1 tablet (5 mg total) by mouth 2 (two) times daily. 180 tablet 1  . Cholecalciferol (VITAMIN D) 2000 units CAPS     . digoxin (LANOXIN) 0.125 MG tablet Take 1 tablet (0.125 mg) by mouth six days a week 90 tablet 3  . furosemide (LASIX) 40 MG tablet Take 40 mg by mouth daily as needed.     Marland Kitchen lisinopril (ZESTRIL) 2.5 MG tablet Take 1 tablet (2.5 mg total) by mouth daily. 90 tablet 3  . loratadine (CLARITIN) 10 MG tablet Take 10 mg by mouth daily.    . metoprolol succinate (TOPROL-XL) 100 MG 24 hr tablet TAKE 2 TABLETS EVERY MORNING AND TAKE 1 TABLET EVERY EVENING 270 tablet 0  . Multiple Vitamins-Minerals (CENTRUM SILVER ADULT 50+) TABS Take 1 tablet by mouth daily.    . Potassium 95 MG TABS Take 1 tablet by mouth.    . pravastatin (PRAVACHOL) 80 MG tablet TAKE 1 TABLET EVERY EVENING 90 tablet 2  . traMADol (ULTRAM) 50 MG tablet TAKE 1 TABLET(50 MG) BY MOUTH EVERY 6 HOURS AS NEEDED 90 tablet 0  . verapamil (CALAN-SR) 120 MG CR tablet TAKE 1 TABLET EVERY DAY 90 tablet 3   No current facility-administered medications for this visit.     Patient Active Problem List   Diagnosis Date Noted  . Neuropathy 11/16/2017  . Prediabetes 11/16/2017  . Vitamin D deficiency 11/16/2017  . Hemochromatosis, hereditary ([place]) 07/23/2017  . Bleeding nose 05/18/2017  . Allergic rhinitis 01/26/2017  . Hematuria 10/26/2016  . Blue nevus 05/16/2016  . Hair loss 05/16/2016  . Abdominal pain 05/16/2016  . Umbilical hernia without obstruction and without gangrene  05/16/2016  . Atrial fibrillation ([place]) 09/26/2014  . Morbid obesity ([place]) 09/26/2014  . Chronic diastolic congestive heart failure ([place]) 09/26/2014  . Anxiety 09/26/2014  . Obstructive sleep apnea on CPAP 09/26/2014  . Essential hypertension 09/26/2014  . Hyperlipidemia 09/26/2014  . Osteoarthritis of both knees 09/26/2014  . Kidney stone 09/26/2014   Past Medical History:  Diagnosis Date  . Chronic a-fib   . Chronic systolic CHF (congestive heart failure) ([place])   . Depression   . Hemochromatosis   . History of kidney stones   . History of pulmonary embolism   . Hyperlipidemia   . Hypertension   . Migraine   . Obesity   . OSA (obstructive sleep apnea)    on CPAP  . Osteoarthritis    bilateral knees  . [facility]  spotted fever 2011    Family History  Problem Relation Age of Onset  . Thyroid disease Mother   . Cancer Father        lung  . Arthritis Father   . Hypertension Father   . Cancer Paternal Grandfather        Throat cancer    Past Surgical History:  Procedure Laterality Date  . CARDIAC CATHETERIZATION  2012   [facility]  . KNEE SURGERY     right knee   . LITHOTRIPSY  07/31/2014  . URETERAL STENT PLACEMENT  2016  . URETEROSCOPY     Social History   Occupational History  . Not on file  Tobacco Use  . Smoking status: Never Smoker  . Smokeless tobacco: Never Used  Substance and Sexual Activity  . Alcohol use: No    Frequency: Never    Comment: occasional; 3 times a year  . Drug use: No  . Sexual activity: Not Currently

## 2018-11-11 NOTE — Progress Notes (Signed)
[facility]  Telephone:([phone]) [phone] Fax:(336) [phone]  ID: [name] OB: December 31, 1955  MR#: [id number]  AOZ#:[id number]  Patient Care Team: Leone Haven, MD as PCP - General (Family Medicine)  I connected with [name] on 11/17/18 at  1:00 PM EDT by telephone visit and verified that I am speaking with the correct person using two identifiers.   I discussed the limitations, risks, security and privacy concerns of performing an evaluation and management service by telemedicine and the availability of in-person appointments. I also discussed with the patient that there may be a patient responsible charge related to this service. The patient expressed understanding and agreed to proceed.   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  INTERVAL HISTORY: Patient agreed to evaluation and discussion of his laboratory work by telephone today.  He currently feels well and is asymptomatic.  He continues to have intentional weight loss. He has no neurologic complaints.  He denies any recent fevers or illnesses.  He denies any chest pain, shortness of breath, cough, or hemoptysis.  He denies any nausea, vomiting, constipation, or diarrhea.  He has no urinary complaints.  Patient feels at his baseline offers no specific complaints today.  REVIEW OF SYSTEMS:   Review of Systems  Constitutional: Positive for weight loss. Negative for fever and malaise/fatigue.  Respiratory: Negative.  Negative for cough, hemoptysis and shortness of breath.   Cardiovascular: Negative.  Negative for chest pain and leg swelling.  Gastrointestinal: Negative.  Negative for abdominal pain, blood in stool and melena.  Genitourinary: Negative.  Negative for hematuria.  Musculoskeletal: Negative.  Negative for back  pain.  Skin: Negative.  Negative for rash.  Neurological: Negative.  Negative for sensory change, focal weakness, weakness and headaches.  Psychiatric/Behavioral: Negative.  The patient is not nervous/anxious.     As per HPI. Otherwise, a complete review of systems is negative.  PAST MEDICAL HISTORY: Past Medical History:  Diagnosis Date  . Chronic a-fib   . Chronic systolic CHF (congestive heart failure) ([place])   . Depression   . Hemochromatosis   . History of kidney stones   . History of pulmonary embolism   . Hyperlipidemia   . Hypertension   . Migraine   . Obesity   . OSA (obstructive sleep apnea)    on CPAP  . Osteoarthritis    bilateral knees  . [place] spotted fever 2011    PAST SURGICAL HISTORY: Past Surgical History:  Procedure Laterality Date  . CARDIAC CATHETERIZATION  2012   [facility]  . KNEE SURGERY     right knee   . LITHOTRIPSY  07/31/2014  . URETERAL STENT PLACEMENT  2016  . URETEROSCOPY      FAMILY HISTORY: Family History  Problem Relation Age of Onset  . Thyroid disease Mother   . Cancer Father        lung  . Arthritis Father   . Hypertension Father   . Cancer Paternal Grandfather        Throat cancer    ADVANCED DIRECTIVES (Y/N):  N  HEALTH MAINTENANCE: Social History   Tobacco Use  . Smoking status: Never Smoker  . Smokeless tobacco: Never Used  Substance Use Topics  . Alcohol use: No    Frequency: Never    Comment: occasional; 3 times a year  . Drug  use: No     Colonoscopy:  PAP:  Bone density:  Lipid panel:  Allergies  Allergen Reactions  . Allopurinol Hives and Swelling  . Other Hives  . Diltiazem Itching and Rash  . Penicillin G Nausea Only    And dirrhea  . Ondansetron Rash  . Ondansetron Hcl Rash  . Penicillins Other (See Comments) and Nausea Only    Pt unsure of rxn; showed up on allergy test Pt unsure of rxn; showed up on allergy test Pt unsure of rxn; showed up on allergy test And dirrhea     Current  Outpatient Medications  Medication Sig Dispense Refill  . acetaminophen (TYLENOL) 500 MG tablet Take 1,000 mg by mouth every 6 (six) hours as needed.    Marland Kitchen apixaban (ELIQUIS) 5 MG TABS tablet Take 1 tablet (5 mg total) by mouth 2 (two) times daily. 180 tablet 1  . Cholecalciferol (VITAMIN D) 2000 units CAPS     . digoxin (LANOXIN) 0.125 MG tablet Take 1 tablet (0.125 mg) by mouth six days a week 90 tablet 3  . furosemide (LASIX) 40 MG tablet Take 40 mg by mouth daily as needed.     . loratadine (CLARITIN) 10 MG tablet Take 10 mg by mouth daily.    . metoprolol succinate (TOPROL-XL) 100 MG 24 hr tablet TAKE 2 TABLETS EVERY MORNING AND TAKE 1 TABLET EVERY EVENING 270 tablet 0  . Multiple Vitamins-Minerals (CENTRUM SILVER ADULT 50+) TABS Take 1 tablet by mouth daily.    . Potassium 95 MG TABS Take 1 tablet by mouth.    . pravastatin (PRAVACHOL) 80 MG tablet TAKE 1 TABLET EVERY EVENING 90 tablet 2  . traMADol (ULTRAM) 50 MG tablet TAKE 1 TABLET(50 MG) BY MOUTH EVERY 6 HOURS AS NEEDED 90 tablet 0  . verapamil (CALAN-SR) 120 MG CR tablet TAKE 1 TABLET EVERY DAY 90 tablet 3  . lisinopril (ZESTRIL) 2.5 MG tablet Take 1 tablet (2.5 mg total) by mouth daily. (Patient not taking: Reported on 11/16/2018) 90 tablet 3   No current facility-administered medications for this visit.     OBJECTIVE: There were no vitals filed for this visit.   There is no height or weight on file to calculate BMI.    ECOG FS:0 - Asymptomatic  LAB RESULTS:  Lab Results  Component Value Date   NA 138 05/18/2018   K 4.4 05/18/2018   CL 101 05/18/2018   CO2 23 05/18/2018   GLUCOSE 94 05/18/2018   BUN 16 05/18/2018   CREATININE 0.87 05/18/2018   CALCIUM 9.7 05/18/2018   PROT 7.3 05/18/2018   ALBUMIN 4.7 05/18/2018   AST 20 05/18/2018   ALT 19 05/18/2018   ALKPHOS 36 (L) 05/18/2018   BILITOT 0.9 05/18/2018   GFRNONAA >60 03/30/2017   GFRAA >60 03/30/2017    Lab Results  Component Value Date   WBC 7.1 11/15/2018    NEUTROABS 3.4 11/15/2018   HGB 17.4 (H) 11/15/2018   HCT 49.0 11/15/2018   MCV 93.9 11/15/2018   PLT 214 11/15/2018   Lab Results  Component Value Date   IRON 105 11/15/2018   TIBC 365 11/15/2018   IRONPCTSAT 29 11/15/2018   Lab Results  Component Value Date   FERRITIN 86 11/15/2018     STUDIES: No results found.  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  Goal ferritin  is 50-100.  Previously, the remainder of his laboratory work is either negative or within normal limits.  He does not require phlebotomy today.  Patient last received phlebotomy on July 17, 2018.  Because patient is on Eliquis, he cannot receive his phlebotomy through [facility].  Return to clinic in 4 months with repeat laboratory, further evaluation, and consideration of phlebotomy. 2.  Weight loss: Intentional.  Patient states he does not require gastric bypass at this time.  I provided 15 minutes of non face-to-face telephone visit time during this encounter, and > 50% was spent counseling as documented under my assessment & plan.  Patient expressed understanding and was in agreement with this plan. He also understands that He can call clinic at any time with any questions, concerns, or complaints.    Lloyd Huger, MD   11/17/2018 8:41 AM

## 2018-11-12 ENCOUNTER — Ambulatory Visit: Payer: Self-pay | Admitting: Orthopaedic Surgery

## 2018-11-14 ENCOUNTER — Other Ambulatory Visit: Payer: Self-pay

## 2018-11-15 ENCOUNTER — Ambulatory Visit: Payer: Medicare HMO | Admitting: Orthopaedic Surgery

## 2018-11-15 ENCOUNTER — Encounter: Payer: Self-pay | Admitting: Orthopaedic Surgery

## 2018-11-15 ENCOUNTER — Other Ambulatory Visit: Payer: Self-pay | Admitting: *Deleted

## 2018-11-15 ENCOUNTER — Inpatient Hospital Stay: Payer: Medicare HMO | Attending: Oncology

## 2018-11-15 ENCOUNTER — Other Ambulatory Visit: Payer: Self-pay

## 2018-11-15 VITALS — BP 119/92 | HR 82 | Ht 71.0 in | Wt 279.0 lb

## 2018-11-15 DIAGNOSIS — M170 Bilateral primary osteoarthritis of knee: Secondary | ICD-10-CM

## 2018-11-15 LAB — CBC WITH DIFFERENTIAL/PLATELET
Abs Immature Granulocytes: 0.04 10*3/uL (ref 0.00–0.07)
Basophils Absolute: 0.1 10*3/uL (ref 0.0–0.1)
Basophils Relative: 1 %
Eosinophils Absolute: 0.2 10*3/uL (ref 0.0–0.5)
Eosinophils Relative: 3 %
HCT: 49 % (ref 39.0–52.0)
Hemoglobin: 17.4 g/dL — ABNORMAL HIGH (ref 13.0–17.0)
Immature Granulocytes: 1 %
Lymphocytes Relative: 39 %
Lymphs Abs: 2.8 10*3/uL (ref 0.7–4.0)
MCH: 33.3 pg (ref 26.0–34.0)
MCHC: 35.5 g/dL (ref 30.0–36.0)
MCV: 93.9 fL (ref 80.0–100.0)
Monocytes Absolute: 0.6 10*3/uL (ref 0.1–1.0)
Monocytes Relative: 9 %
Neutro Abs: 3.4 10*3/uL (ref 1.7–7.7)
Neutrophils Relative %: 47 %
Platelets: 214 10*3/uL (ref 150–400)
RBC: 5.22 MIL/uL (ref 4.22–5.81)
RDW: 12.5 % (ref 11.5–15.5)
WBC: 7.1 10*3/uL (ref 4.0–10.5)
nRBC: 0 % (ref 0.0–0.2)

## 2018-11-15 LAB — IRON AND TIBC
Iron: 105 ug/dL (ref 45–182)
Saturation Ratios: 29 % (ref 17.9–39.5)
TIBC: 365 ug/dL (ref 250–450)
UIBC: 260 ug/dL

## 2018-11-15 LAB — FERRITIN: Ferritin: 86 ng/mL (ref 24–336)

## 2018-11-15 MED ORDER — LIDOCAINE HCL 1 % IJ SOLN
2.0000 mL | INTRAMUSCULAR | Status: AC | PRN
  Administered 2018-11-15: 2 mL

## 2018-11-15 MED ORDER — HYALURONAN 30 MG/2ML IX SOSY
30.0000 mg | PREFILLED_SYRINGE | INTRA_ARTICULAR | Status: AC | PRN
  Administered 2018-11-15: 30 mg via INTRA_ARTICULAR

## 2018-11-15 MED ORDER — HYALURONAN 30 MG/2ML IX SOSY
30.0000 mg | PREFILLED_SYRINGE | INTRA_ARTICULAR | Status: AC | PRN
  Administered 2018-11-15: 11:00:00 30 mg via INTRA_ARTICULAR

## 2018-11-15 NOTE — Progress Notes (Signed)
Office Visit Note   Patient: Carlos Crosby           Date of Birth: 06-Apr-1956           MRN: [id number] Visit Date: 11/15/2018              Requested by: Leone Haven, MD [address] [place],  [place] [address] PCP: Leone Haven, MD   Assessment & Plan: Visit Diagnoses:  1. Primary osteoarthritis of both knees     Plan:  #1: Bilateral Orthovisc was given today without difficulty.  Second round today. #2: Follow-up in 1 week for third injections bilaterally of Orthovisc.   Follow-Up Instructions: Return in about 1 week (around 11/22/2018).   Orders:  Orders Placed This Encounter  Procedures  . Large Joint Inj: bilateral knee   No orders of the defined types were placed in this encounter.     Procedures: Large Joint Inj: bilateral knee on 11/15/2018 10:49 AM Indications: pain and joint swelling Details: 25 G 1.5 in needle, anteromedial approach  Arthrogram: No  Medications (Right): 2 mL lidocaine 1 %; 30 mg Hyaluronan 30 MG/2ML Medications (Left): 2 mL lidocaine 1 %; 30 mg Hyaluronan 30 MG/2ML Outcome: tolerated well, no immediate complications Procedure, treatment alternatives, risks and benefits explained, specific risks discussed. Consent was given by the patient. Immediately prior to procedure a time out was called to verify the correct patient, procedure, equipment, support staff and site/side marked as required. Patient was prepped and draped in the usual sterile fashion.       Clinical Data: No additional findings.   Subjective: Chief Complaint  Patient presents with  . Left Knee - Follow-up    Bilateral orthovisc started 11/08/18  . Right Knee - Follow-up    HPI: Patient presents today for the second orthovisc injections bilaterally. He started the injections on 11/08/18. Denies any reactivity.  States he has had some improvement.  Review of Systems  Constitutional: Negative for fatigue.  HENT: Negative for ear pain.    Eyes: Negative for pain.  Respiratory: Negative for shortness of breath.   Cardiovascular: Negative for leg swelling.  Gastrointestinal: Negative for constipation and diarrhea.  Endocrine: Negative for cold intolerance and heat intolerance.  Genitourinary: Negative for difficulty urinating.  Musculoskeletal: Positive for joint swelling.  Skin: Negative for rash.  Allergic/Immunologic: Positive for food allergies.  Neurological: Negative for weakness.  Hematological: Does not bruise/bleed easily.  Psychiatric/Behavioral: Positive for sleep disturbance.     Objective: Vital Signs: BP (!) 119/92   Pulse 82   Ht 5\' 11"  (1.803 m)   Wt 279 lb (126.6 kg)   BMI 38.91 kg/m   Physical Exam Constitutional:      Appearance: He is well-developed.  Eyes:     Pupils: Pupils are equal, round, and reactive to light.  Pulmonary:     Effort: Pulmonary effort is normal.  Skin:    General: Skin is warm and dry.  Neurological:     Mental Status: He is alert and oriented to person, place, and time.  Psychiatric:        Behavior: Behavior normal.     Ortho Exam  No signs of reactivity bilaterally.  No warmth or erythema.  He is on Eliquis.  Has not had any bleeding secondary to the injections.  Specialty Comments:  No specialty comments available.  Imaging: No results found.   PMFS History: Current Outpatient Medications  Medication Sig Dispense Refill  . acetaminophen (  TYLENOL) 500 MG tablet Take 1,000 mg by mouth every 6 (six) hours as needed.    Marland Kitchen apixaban (ELIQUIS) 5 MG TABS tablet Take 1 tablet (5 mg total) by mouth 2 (two) times daily. 180 tablet 1  . Cholecalciferol (VITAMIN D) 2000 units CAPS     . digoxin (LANOXIN) 0.125 MG tablet Take 1 tablet (0.125 mg) by mouth six days a week 90 tablet 3  . furosemide (LASIX) 40 MG tablet Take 40 mg by mouth daily as needed.     Marland Kitchen lisinopril (ZESTRIL) 2.5 MG tablet Take 1 tablet (2.5 mg total) by mouth daily. 90 tablet 3  . loratadine  (CLARITIN) 10 MG tablet Take 10 mg by mouth daily.    . metoprolol succinate (TOPROL-XL) 100 MG 24 hr tablet TAKE 2 TABLETS EVERY MORNING AND TAKE 1 TABLET EVERY EVENING 270 tablet 0  . Multiple Vitamins-Minerals (CENTRUM SILVER ADULT 50+) TABS Take 1 tablet by mouth daily.    . Potassium 95 MG TABS Take 1 tablet by mouth.    . pravastatin (PRAVACHOL) 80 MG tablet TAKE 1 TABLET EVERY EVENING 90 tablet 2  . traMADol (ULTRAM) 50 MG tablet TAKE 1 TABLET(50 MG) BY MOUTH EVERY 6 HOURS AS NEEDED 90 tablet 0  . verapamil (CALAN-SR) 120 MG CR tablet TAKE 1 TABLET EVERY DAY 90 tablet 3   No current facility-administered medications for this visit.     Patient Active Problem List   Diagnosis Date Noted  . Neuropathy 11/16/2017  . Prediabetes 11/16/2017  . Vitamin D deficiency 11/16/2017  . Hemochromatosis, hereditary ([place]) 07/23/2017  . Bleeding nose 05/18/2017  . Allergic rhinitis 01/26/2017  . Hematuria 10/26/2016  . Blue nevus 05/16/2016  . Hair loss 05/16/2016  . Abdominal pain 05/16/2016  . Umbilical hernia without obstruction and without gangrene 05/16/2016  . Atrial fibrillation ([place]) 09/26/2014  . Morbid obesity ([place]) 09/26/2014  . Chronic diastolic congestive heart failure ([place]) 09/26/2014  . Anxiety 09/26/2014  . Obstructive sleep apnea on CPAP 09/26/2014  . Essential hypertension 09/26/2014  . Hyperlipidemia 09/26/2014  . Osteoarthritis of both knees 09/26/2014  . Kidney stone 09/26/2014   Past Medical History:  Diagnosis Date  . Chronic a-fib   . Chronic systolic CHF (congestive heart failure) ([place])   . Depression   . Hemochromatosis   . History of kidney stones   . History of pulmonary embolism   . Hyperlipidemia   . Hypertension   . Migraine   . Obesity   . OSA (obstructive sleep apnea)    on CPAP  . Osteoarthritis    bilateral knees  . [place] spotted fever 2011    Family History  Problem Relation Age of Onset  . Thyroid disease Mother   . Cancer  Father        lung  . Arthritis Father   . Hypertension Father   . Cancer Paternal Grandfather        Throat cancer    Past Surgical History:  Procedure Laterality Date  . CARDIAC CATHETERIZATION  2012   [facility]  . KNEE SURGERY     right knee   . LITHOTRIPSY  07/31/2014  . URETERAL STENT PLACEMENT  2016  . URETEROSCOPY     Social History   Occupational History  . Not on file  Tobacco Use  . Smoking status: Never Smoker  . Smokeless tobacco: Never Used  Substance and Sexual Activity  . Alcohol use: No    Frequency: Never  Comment: occasional; 3 times a year  . Drug use: No  . Sexual activity: Not Currently

## 2018-11-16 ENCOUNTER — Inpatient Hospital Stay: Payer: Medicare HMO

## 2018-11-16 ENCOUNTER — Inpatient Hospital Stay (HOSPITAL_BASED_OUTPATIENT_CLINIC_OR_DEPARTMENT_OTHER): Payer: Medicare HMO | Admitting: Oncology

## 2018-11-16 DIAGNOSIS — I10 Essential (primary) hypertension: Secondary | ICD-10-CM | POA: Diagnosis not present

## 2018-11-16 DIAGNOSIS — Z79899 Other long term (current) drug therapy: Secondary | ICD-10-CM

## 2018-11-16 NOTE — Progress Notes (Signed)
Patient denies any concerns today.  

## 2018-11-19 ENCOUNTER — Ambulatory Visit: Payer: Self-pay | Admitting: Orthopaedic Surgery

## 2018-11-22 ENCOUNTER — Ambulatory Visit: Payer: Medicare HMO | Admitting: Orthopaedic Surgery

## 2018-11-22 ENCOUNTER — Encounter: Payer: Self-pay | Admitting: Orthopaedic Surgery

## 2018-11-22 ENCOUNTER — Other Ambulatory Visit: Payer: Self-pay

## 2018-11-22 DIAGNOSIS — M170 Bilateral primary osteoarthritis of knee: Secondary | ICD-10-CM

## 2018-11-22 MED ORDER — HYALURONAN 30 MG/2ML IX SOSY
30.0000 mg | PREFILLED_SYRINGE | INTRA_ARTICULAR | Status: AC | PRN
  Administered 2018-11-22: 30 mg via INTRA_ARTICULAR

## 2018-11-22 NOTE — Progress Notes (Signed)
Office Visit Note   Patient: Carlos Crosby           Date of Birth: May 08, 1956           MRN: [id number] Visit Date: 11/22/2018              Requested by: Leone Haven, MD [address] [place],  [place] [address] PCP: Leone Haven, MD   Assessment & Plan: Visit Diagnoses:  1. Primary osteoarthritis of both knees     Plan: Third and final Orthovisc injection in both knees.  Doing well.  We will plan to see back as needed  Follow-Up Instructions: Return if symptoms worsen or fail to improve.   Orders:  No orders of the defined types were placed in this encounter.  No orders of the defined types were placed in this encounter.     Procedures: Large Joint Inj: bilateral knee on 11/22/2018 1:26 PM Indications: pain and joint swelling Details: 25 G 1.5 in needle, anteromedial approach  Arthrogram: No  Medications (Right): 30 mg Hyaluronan 30 MG/2ML Medications (Left): 30 mg Hyaluronan 30 MG/2ML Outcome: tolerated well, no immediate complications Procedure, treatment alternatives, risks and benefits explained, specific risks discussed. Consent was given by the patient. Immediately prior to procedure a time out was called to verify the correct patient, procedure, equipment, support staff and site/side marked as required. Patient was prepped and draped in the usual sterile fashion.       Clinical Data: No additional findings.   Subjective: No chief complaint on file. Has had 2 prior Orthovisc injections to both knees and feels like it has made a difference.  No complications  HPI  Review of Systems   Objective: Vital Signs: There were no vitals taken for this visit.  Physical Exam  Ortho Exam small effusions both knees.  Lacks a few degrees to full extension both knees related to his arthritis.  Skin intact. no evidence of any infection  Specialty Comments:  No specialty comments available.  Imaging: No results found.   PMFS History:  Patient Active Problem List   Diagnosis Date Noted  . Neuropathy 11/16/2017  . Prediabetes 11/16/2017  . Vitamin D deficiency 11/16/2017  . Hemochromatosis, hereditary ([place]) 07/23/2017  . Bleeding nose 05/18/2017  . Allergic rhinitis 01/26/2017  . Hematuria 10/26/2016  . Blue nevus 05/16/2016  . Hair loss 05/16/2016  . Abdominal pain 05/16/2016  . Umbilical hernia without obstruction and without gangrene 05/16/2016  . Atrial fibrillation ([place]) 09/26/2014  . Morbid obesity ([place]) 09/26/2014  . Chronic diastolic congestive heart failure ([place]) 09/26/2014  . Anxiety 09/26/2014  . Obstructive sleep apnea on CPAP 09/26/2014  . Essential hypertension 09/26/2014  . Hyperlipidemia 09/26/2014  . Osteoarthritis of both knees 09/26/2014  . Kidney stone 09/26/2014   Past Medical History:  Diagnosis Date  . Chronic a-fib   . Chronic systolic CHF (congestive heart failure) ([facility])   . Depression   . Hemochromatosis   . History of kidney stones   . History of pulmonary embolism   . Hyperlipidemia   . Hypertension   . Migraine   . Obesity   . OSA (obstructive sleep apnea)    on CPAP  . Osteoarthritis    bilateral knees  . [place] spotted fever 2011    Family History  Problem Relation Age of Onset  . Thyroid disease Mother   . Cancer Father        lung  . Arthritis Father   .  Hypertension Father   . Cancer Paternal Grandfather        Throat cancer    Past Surgical History:  Procedure Laterality Date  . CARDIAC CATHETERIZATION  2012   [facility]  . KNEE SURGERY     right knee   . LITHOTRIPSY  07/31/2014  . URETERAL STENT PLACEMENT  2016  . URETEROSCOPY     Social History   Occupational History  . Not on file  Tobacco Use  . Smoking status: Never Smoker  . Smokeless tobacco: Never Used  Substance and Sexual Activity  . Alcohol use: No    Frequency: Never    Comment: occasional; 3 times a year  . Drug use: No  . Sexual activity: Not Currently     Garald Balding, MD   Note - This record has been created using [employer].  Chart creation errors have been sought, but may not always  have been located. Such creation errors do not reflect on  the standard of medical care.

## 2018-11-23 ENCOUNTER — Other Ambulatory Visit: Payer: Self-pay | Admitting: Family Medicine

## 2018-11-26 ENCOUNTER — Other Ambulatory Visit: Payer: Self-pay | Admitting: Family Medicine

## 2018-11-28 ENCOUNTER — Other Ambulatory Visit: Payer: Self-pay | Admitting: *Deleted

## 2018-11-28 DIAGNOSIS — Z79899 Other long term (current) drug therapy: Secondary | ICD-10-CM

## 2018-11-28 DIAGNOSIS — I4819 Other persistent atrial fibrillation: Secondary | ICD-10-CM

## 2018-11-30 ENCOUNTER — Other Ambulatory Visit: Payer: Self-pay

## 2018-11-30 ENCOUNTER — Other Ambulatory Visit
Admission: RE | Admit: 2018-11-30 | Discharge: 2018-11-30 | Disposition: A | Discharge status: Home / Self Care | Payer: Medicare HMO | Source: Ambulatory Visit | Attending: Internal Medicine | Admitting: Internal Medicine

## 2018-11-30 DIAGNOSIS — I4819 Other persistent atrial fibrillation: Secondary | ICD-10-CM

## 2018-11-30 DIAGNOSIS — Z79899 Other long term (current) drug therapy: Secondary | ICD-10-CM | POA: Diagnosis not present

## 2018-11-30 LAB — DIGOXIN LEVEL: Digoxin Level: 0.6 ng/mL — ABNORMAL LOW (ref 0.8–2.0)

## 2018-12-09 ENCOUNTER — Encounter: Payer: Self-pay | Admitting: Family Medicine

## 2018-12-13 ENCOUNTER — Telehealth: Payer: Medicare HMO | Admitting: Family

## 2018-12-13 DIAGNOSIS — R059 Cough, unspecified: Secondary | ICD-10-CM

## 2018-12-13 DIAGNOSIS — R110 Nausea: Secondary | ICD-10-CM

## 2018-12-13 DIAGNOSIS — R05 Cough: Secondary | ICD-10-CM

## 2018-12-13 MED ORDER — ALBUTEROL SULFATE HFA 108 (90 BASE) MCG/ACT IN AERS: 2.0000 | INHALATION_SPRAY | RESPIRATORY_TRACT | 0 refills | Status: DC | PRN

## 2018-12-13 MED ORDER — PROMETHAZINE HCL 12.5 MG PO TABS: 12.5000 mg | ORAL_TABLET | Freq: Four times a day (QID) | ORAL | 0 refills | Status: DC | PRN

## 2018-12-13 NOTE — Progress Notes (Signed)
Greater than 5 minutes, yet less than 10 minutes of time have been spent researching, coordinating, and implementing care for this patient today.  Thank you for the details you included in the comment boxes. Those details are very helpful in determining the best course of treatment for you and help [place] to provide the best care.  These symptoms are not consistent with Covid-19 and are more consistent with reflux. See below.    Based on what you shared with me it looks like you most likely have Gastroesophageal Reflux Disease (GERD)  Gastroesophageal reflux disease (GERD) happens when acid from your stomach flows up into the esophagus.  When acid comes in contact with the esophagus, the acid causes sorenss (inflammation) in the esophagus.  Over time, GERD may create small holes (ulcers) in the lining of the esophagus.  I recommend using over the counter Pepcid 20mg  twice a day for two weeks.   I have also sent some Phenergan 12.5mg  every 8 hours as needed for nauea.    ----------------------------  E-Visit for [facility] Virus Screening   Based on your current symptoms, it seems unlikely that your symptoms are related to the Coronavirus.     COVID-19 is a respiratory illness with symptoms that are similar to the flu. Symptoms are typically mild to moderate, but there have been cases of severe illness and death due to the virus. The following symptoms may appear 2-14 days after exposure: . Fever . Cough . Shortness of breath or difficulty breathing . Chills . Repeated shaking with chills . Muscle pain . Headache . Sore throat . New loss of taste or smell . Fatigue . Congestion or runny nose . Nausea or vomiting . Diarrhea  It is vitally important that if you feel that you have an infection such as this virus or any other virus that you stay home and away from places where you may spread it to others.  You should self-quarantine for 14 days if you have symptoms that could potentially be  coronavirus or have been in close contact a with a person diagnosed with COVID-19 within the last 2 weeks. You should avoid contact with people age 55 and older.   You should wear a mask or cloth face covering over your nose and mouth if you must be around other people or animals, including pets (even at home). Try to stay at least 6 feet away from other people. This will protect the people around you.  You can use medication such as A prescription inhaler called Albuterol MDI 90 mcg /actuation 2 puffs every 4 hours as needed for shortness of breath, wheezing, cough  You may also take acetaminophen (Tylenol) as needed for fever.   Reduce your risk of any infection by using the same precautions used for avoiding the common cold or flu:  Marland Kitchen Wash your hands often with soap and warm water for at least 20 seconds.  If soap and water are not readily available, use an alcohol-based hand sanitizer with at least 60% alcohol.  . If coughing or sneezing, cover your mouth and nose by coughing or sneezing into the elbow areas of your shirt or coat, into a tissue or into your sleeve (not your hands). . Avoid shaking hands with others and consider head nods or verbal greetings only. . Avoid touching your eyes, nose, or mouth with unwashed hands.  . Avoid close contact with people who are sick. . Avoid places or events with large numbers of people in one  location, like concerts or sporting events. . Carefully consider travel plans you have or are making. . If you are planning any travel outside or inside the [place], visit the CDC's Travelers' Health webpage for the latest health notices. . If you have some symptoms but not all symptoms, continue to monitor at home and seek medical attention if your symptoms worsen. . If you are having a medical emergency, call 911.  HOME CARE . Only take medications as instructed by your medical team. . Drink plenty of fluids and get plenty of rest. . A steam or ultrasonic  humidifier can help if you have congestion.   GET HELP RIGHT AWAY IF YOU HAVE EMERGENCY WARNING SIGNS** FOR COVID-19. If you or someone is showing any of these signs seek emergency medical care immediately. Call 911 or proceed to your closest emergency facility if: . You develop worsening high fever. . Trouble breathing . Bluish lips or face . Persistent pain or pressure in the chest . New confusion . Inability to wake or stay awake . You cough up blood. . Your symptoms become more severe  **This list is not all possible symptoms. Contact your medical provider for any symptoms that are sever or concerning to you.   MAKE SURE YOU   Understand these instructions.  Will watch your condition.  Will get help right away if you are not doing well or get worse.  Your e-visit answers were reviewed by a board certified advanced clinical practitioner to complete your personal care plan.  Depending on the condition, your plan could have included both over the counter or prescription medications.  If there is a problem please reply once you have received a response from your provider.  Your safety is important to [place].  If you have drug allergies check your prescription carefully.    You can use MyChart to ask questions about today's visit, request a non-urgent call back, or ask for a work or school excuse for 24 hours related to this e-Visit. If it has been greater than 24 hours you will need to follow up with your provider, or enter a new e-Visit to address those concerns. You will get an e-mail in the next two days asking about your experience.  I hope that your e-visit has been valuable and will speed your recovery. Thank you for using e-visits.

## 2019-01-02 ENCOUNTER — Other Ambulatory Visit: Payer: Self-pay | Admitting: Family Medicine

## 2019-01-03 NOTE — Telephone Encounter (Signed)
Refilled: 11/26/2018 Last OV: 09/24/2018 Next OV: 01/30/2019

## 2019-01-17 DIAGNOSIS — G4733 Obstructive sleep apnea (adult) (pediatric): Secondary | ICD-10-CM | POA: Diagnosis not present

## 2019-01-17 DIAGNOSIS — G4730 Sleep apnea, unspecified: Secondary | ICD-10-CM | POA: Diagnosis not present

## 2019-01-23 DIAGNOSIS — G4733 Obstructive sleep apnea (adult) (pediatric): Secondary | ICD-10-CM | POA: Diagnosis not present

## 2019-01-23 DIAGNOSIS — G4730 Sleep apnea, unspecified: Secondary | ICD-10-CM | POA: Diagnosis not present

## 2019-01-29 ENCOUNTER — Encounter: Payer: Self-pay | Admitting: Family Medicine

## 2019-01-30 ENCOUNTER — Ambulatory Visit: Payer: Self-pay | Admitting: Family Medicine

## 2019-02-04 ENCOUNTER — Other Ambulatory Visit: Payer: Self-pay

## 2019-02-05 ENCOUNTER — Ambulatory Visit (INDEPENDENT_AMBULATORY_CARE_PROVIDER_SITE_OTHER): Payer: Medicare HMO | Admitting: Family Medicine

## 2019-02-05 ENCOUNTER — Encounter: Payer: Self-pay | Admitting: Family Medicine

## 2019-02-05 ENCOUNTER — Other Ambulatory Visit: Payer: Self-pay

## 2019-02-05 VITALS — BP 115/70 | HR 89 | Temp 96.9°F | Ht 71.0 in | Wt 291.6 lb

## 2019-02-05 DIAGNOSIS — I4891 Unspecified atrial fibrillation: Secondary | ICD-10-CM

## 2019-02-05 DIAGNOSIS — E7800 Pure hypercholesterolemia, unspecified: Secondary | ICD-10-CM

## 2019-02-05 DIAGNOSIS — R7303 Prediabetes: Secondary | ICD-10-CM

## 2019-02-05 DIAGNOSIS — Z9989 Dependence on other enabling machines and devices: Secondary | ICD-10-CM

## 2019-02-05 DIAGNOSIS — Z23 Encounter for immunization: Secondary | ICD-10-CM | POA: Diagnosis not present

## 2019-02-05 DIAGNOSIS — M170 Bilateral primary osteoarthritis of knee: Secondary | ICD-10-CM | POA: Diagnosis not present

## 2019-02-05 DIAGNOSIS — G4733 Obstructive sleep apnea (adult) (pediatric): Secondary | ICD-10-CM | POA: Diagnosis not present

## 2019-02-05 DIAGNOSIS — W19XXXA Unspecified fall, initial encounter: Secondary | ICD-10-CM

## 2019-02-05 LAB — COMPREHENSIVE METABOLIC PANEL
ALT: 20 U/L (ref 0–53)
AST: 18 U/L (ref 0–37)
Albumin: 4.3 g/dL (ref 3.5–5.2)
Alkaline Phosphatase: 41 U/L (ref 39–117)
BUN: 20 mg/dL (ref 6–23)
CO2: 27 mEq/L (ref 19–32)
Calcium: 9.4 mg/dL (ref 8.4–10.5)
Chloride: 102 mEq/L (ref 96–112)
Creatinine, Ser: 0.85 mg/dL (ref 0.40–1.50)
GFR: 91.32 mL/min (ref >60.00)
Glucose, Bld: 86 mg/dL (ref 70–99)
Potassium: 4.5 mEq/L (ref 3.5–5.1)
Sodium: 138 mEq/L (ref 135–145)
Total Bilirubin: 1.1 mg/dL (ref 0.2–1.2)
Total Protein: 6.7 g/dL (ref 6.0–8.3)

## 2019-02-05 LAB — LIPID PANEL
Cholesterol: 150 mg/dL (ref 0–200)
HDL: 40.7 mg/dL (ref >39.00)
LDL Cholesterol: 82 mg/dL (ref 0–99)
NonHDL: 109.26
Total CHOL/HDL Ratio: 4
Triglycerides: 135 mg/dL (ref 0.0–149.0)
VLDL: 27 mg/dL (ref 0.0–40.0)

## 2019-02-05 LAB — HEMOGLOBIN A1C: Hgb A1c MFr Bld: 5.3 % (ref 4.6–6.5)

## 2019-02-05 NOTE — Patient Instructions (Signed)
Nice to see you. We will get lab work today. Please continue exercise and monitoring your diet.

## 2019-02-05 NOTE — Assessment & Plan Note (Signed)
Doing quite well with this.  Encouraged continued weight loss.  Continue as needed tramadol.

## 2019-02-05 NOTE — Assessment & Plan Note (Signed)
Symptoms are well controlled on CPAP.  He will continue his CPAP.

## 2019-02-05 NOTE — Assessment & Plan Note (Addendum)
Mechanical fall with his dog tripping him several months ago.  No injuries.  He will monitor.

## 2019-02-05 NOTE — Progress Notes (Signed)
  [name], MD Phone: [phone]  Carlos Crosby is a 63 y.o. male who presents today for f/u.  HYPERLIPIDEMIA Symptoms Chest pain on exertion:  no   Medications: Compliance- taking pravastatin Right upper quadrant pain- no  Muscle aches- no  A. fib: Taking Eliquis, digoxin, metoprolol, and verapamil.  He has had some fatigue with the metoprolol and they have separated out his metoprolol dose and that has been beneficial.  No palpitations.  No chest pain.  No bleeding issues.  He uses an app to tell him what his heart rate is.  OSA: Using his CPAP for 7 to 8 hours a night.  Does note some fatigue with the metoprolol but that is improving with change in that dose.  Otherwise no hypersomnia.  He does wake well rested.  Osteoarthritis of the knees: He had Orthovisc through orthopedics.  That has been beneficial.  He uses the tramadol only as needed.  He has lost 90 pounds by limiting calories and exercising.  This has helped with his pain as well.  Fall: Patient was tripped by his 95 pound dog.  He had no injury related to this.  This occurred 5 to 6 months ago.  No other falls.   Social History   Tobacco Use  Smoking Status Never Smoker  Smokeless Tobacco Never Used     ROS see history of present illness  Objective  Physical Exam Vitals:   02/05/19 0944  BP: 115/70  Pulse: 89  Temp: (!) 96.9 F (36.1 C)  SpO2: 99%    BP Readings from Last 3 Encounters:  02/05/19 115/70  11/15/18 (!) 119/92  08/09/18 129/88   Wt Readings from Last 3 Encounters:  02/05/19 291 lb 9.6 oz (132.3 kg)  11/15/18 279 lb (126.6 kg)  10/04/18 279 lb (126.6 kg)    Physical Exam Constitutional:      General: He is not in acute distress.    Appearance: He is not diaphoretic.  Cardiovascular:     Rate and Rhythm: Normal rate. Rhythm irregularly irregular.     Heart sounds: Normal heart sounds.  Pulmonary:     Effort: Pulmonary effort is normal.     Breath sounds: Normal breath  sounds.  Musculoskeletal:     Right lower leg: No edema.     Left lower leg: No edema.     Comments: Bilateral knees with no tenderness, warmth, or effusion  Skin:    General: Skin is warm and dry.  Neurological:     Mental Status: He is alert.      Assessment/Plan: Please see individual problem list.  Atrial fibrillation ([place]) Rate controlled.  Cardiology will continue to manage his medications for this.  Obstructive sleep apnea on CPAP Symptoms are well controlled on CPAP.  He will continue his CPAP.  Osteoarthritis of both knees Doing quite well with this.  Encouraged continued weight loss.  Continue as needed tramadol.  Hyperlipidemia Continue pravastatin.  Check lipid panel.  Prediabetes Much improved on most recent check.  Check A1c.  Fall Mechanical fall with his dog tripping him several months ago.  No injuries.  He will monitor.   Health Maintenance: Flu vaccine given.  Orders Placed This Encounter  Procedures  . Flu Vaccine QUAD 6+ mos PF IM (Fluarix Quad PF)    No orders of the defined types were placed in this encounter.    [name], MD [facility]

## 2019-02-05 NOTE — Assessment & Plan Note (Signed)
Rate controlled.  Cardiology will continue to manage his medications for this.

## 2019-02-05 NOTE — Assessment & Plan Note (Signed)
Continue pravastatin.  Check lipid panel.

## 2019-02-05 NOTE — Assessment & Plan Note (Signed)
Much improved on most recent check.  Check A1c.

## 2019-02-10 ENCOUNTER — Other Ambulatory Visit: Payer: Self-pay | Admitting: Internal Medicine

## 2019-02-11 ENCOUNTER — Other Ambulatory Visit: Payer: Self-pay | Admitting: Family Medicine

## 2019-02-14 ENCOUNTER — Telehealth: Payer: Self-pay | Admitting: Cardiovascular Disease

## 2019-02-14 NOTE — Telephone Encounter (Signed)
Virtual Visit Pre-Appointment Phone Call  "(Name), I am calling you today to discuss your upcoming appointment. We are currently trying to limit exposure to the virus that causes COVID-19 by seeing patients at home rather than in the office."  1. "What is the BEST phone number to call the day of the visit?" - include this in appointment notes  2. Do you have or have access to (through a family member/friend) a smartphone with video capability that we can use for your visit?" a. If yes - list this number in appt notes as cell (if different from BEST phone #) and list the appointment type as a VIDEO visit in appointment notes b. If no - list the appointment type as a PHONE visit in appointment notes  3. Confirm consent - "In the setting of the current Covid19 crisis, you are scheduled for a (phone or video) visit with your provider on (date) at (time).  Just as we do with many in-office visits, in order for you to participate in this visit, we must obtain consent.  If you'd like, I can send this to your mychart (if signed up) or email for you to review.  Otherwise, I can obtain your verbal consent now.  All virtual visits are billed to your insurance company just like a normal visit would be.  By agreeing to a virtual visit, we'd like you to understand that the technology does not allow for your provider to perform an examination, and thus may limit your provider's ability to fully assess your condition. If your provider identifies any concerns that need to be evaluated in person, we will make arrangements to do so.  Finally, though the technology is pretty good, we cannot assure that it will always work on either your or our end, and in the setting of a video visit, we may have to convert it to a phone-only visit.  In either situation, we cannot ensure that we have a secure connection.  Are you willing to proceed?" STAFF: Did the patient verbally acknowledge consent to telehealth visit? Document  YES/NO here: YES  4. Advise patient to be prepared - "Two hours prior to your appointment, go ahead and check your blood pressure, pulse, oxygen saturation, and your weight (if you have the equipment to check those) and write them all down. When your visit starts, your provider will ask you for this information. If you have an Apple Watch or Kardia device, please plan to have heart rate information ready on the day of your appointment. Please have a pen and paper handy nearby the day of the visit as well."  5. Give patient instructions for MyChart download to smartphone OR Doximity/Doxy.me as below if video visit (depending on what platform provider is using)  6. Inform patient they will receive a phone call 15 minutes prior to their appointment time (may be from unknown caller ID) so they should be prepared to answer    TELEPHONE CALL NOTE  Carlos Crosby has been deemed a candidate for a follow-up tele-health visit to limit community exposure during the Covid-19 pandemic. I spoke with the patient via phone to ensure availability of phone/video source, confirm preferred email & phone number, and discuss instructions and expectations.  I reminded Carlos Crosby to be prepared with any vital sign and/or heart rhythm information that could potentially be obtained via home monitoring, at the time of his visit. I reminded Carlos Crosby to expect a phone call prior to  his visit.  Carlos Crosby 02/14/2019 10:25 AM   INSTRUCTIONS FOR DOWNLOADING THE MYCHART APP TO SMARTPHONE  - The patient must first make sure to have activated MyChart and know their login information - If Apple, go to [employer] and type in MyChart in the search bar and download the app. If Android, ask patient to go to [employer] and type in [place] in the search bar and download the app. The app is free but as with any other app downloads, their phone may require them to verify saved payment information or Apple/Android  password.  - The patient will need to then log into the app with their MyChart username and password, and select [facility] as their healthcare provider to link the account. When it is time for your visit, go to the MyChart app, find appointments, and click Begin Video Visit. Be sure to Select Allow for your device to access the Microphone and Camera for your visit. You will then be connected, and your provider will be with you shortly.  **If they have any issues connecting, or need assistance please contact MyChart service desk [phone])**  **If using a computer, in order to ensure the best quality for their visit they will need to use either of the following Internet Browsers: [employer], or Google Chrome**  IF USING DOXIMITY or DOXY.ME - The patient will receive a link just prior to their visit by text.     FULL LENGTH CONSENT FOR TELE-HEALTH VISIT   I hereby voluntarily request, consent and authorize [facility] and its employed or contracted physicians, physician assistants, nurse practitioners or other licensed health care professionals (the Practitioner), to provide me with telemedicine health care services (the Services") as deemed necessary by the treating Practitioner. I acknowledge and consent to receive the Services by the Practitioner via telemedicine. I understand that the telemedicine visit will involve communicating with the Practitioner through live audiovisual communication technology and the disclosure of certain medical information by electronic transmission. I acknowledge that I have been given the opportunity to request an in-person assessment or other available alternative prior to the telemedicine visit and am voluntarily participating in the telemedicine visit.  I understand that I have the right to withhold or withdraw my consent to the use of telemedicine in the course of my care at any time, without affecting my right to future care or treatment,  and that the Practitioner or I may terminate the telemedicine visit at any time. I understand that I have the right to inspect all information obtained and/or recorded in the course of the telemedicine visit and may receive copies of available information for a reasonable fee.  I understand that some of the potential risks of receiving the Services via telemedicine include:   Delay or interruption in medical evaluation due to technological equipment failure or disruption;  Information transmitted may not be sufficient (e.g. poor resolution of images) to allow for appropriate medical decision making by the Practitioner; and/or   In rare instances, security protocols could fail, causing a breach of personal health information.  Furthermore, I acknowledge that it is my responsibility to provide information about my medical history, conditions and care that is complete and accurate to the best of my ability. I acknowledge that Practitioner's advice, recommendations, and/or decision may be based on factors not within their control, such as incomplete or inaccurate data provided by me or distortions of diagnostic images or specimens that may result from electronic transmissions. I  understand that the practice of medicine is not an exact science and that Practitioner makes no warranties or guarantees regarding treatment outcomes. I acknowledge that I will receive a copy of this consent concurrently upon execution via email to the email address I last provided but may also request a printed copy by calling the office of [facility].    I understand that my insurance will be billed for this visit.   I have read or had this consent read to me.  I understand the contents of this consent, which adequately explains the benefits and risks of the Services being provided via telemedicine.   I have been provided ample opportunity to ask questions regarding this consent and the Services and have had my questions  answered to my satisfaction.  I give my informed consent for the services to be provided through the use of telemedicine in my medical care  By participating in this telemedicine visit I agree to the above.

## 2019-02-15 ENCOUNTER — Other Ambulatory Visit: Payer: Self-pay | Admitting: Cardiovascular Disease

## 2019-02-15 DIAGNOSIS — I4891 Unspecified atrial fibrillation: Secondary | ICD-10-CM

## 2019-02-15 NOTE — Telephone Encounter (Signed)
Last ov 10/04/2018 63 years old 132kg Scr 0.85 on 02/05/2019

## 2019-02-15 NOTE — Telephone Encounter (Signed)
Please review for refill. Thanks!  

## 2019-02-22 ENCOUNTER — Encounter: Payer: Self-pay | Admitting: Orthopaedic Surgery

## 2019-02-25 NOTE — Telephone Encounter (Signed)
LMOM for patient to call and schedule appointment with Dr. Durward Fortes for bilateral knee cortisone injections.

## 2019-02-26 ENCOUNTER — Encounter: Payer: Self-pay | Admitting: Orthopaedic Surgery

## 2019-02-26 ENCOUNTER — Ambulatory Visit (INDEPENDENT_AMBULATORY_CARE_PROVIDER_SITE_OTHER): Payer: Medicare HMO | Admitting: Orthopaedic Surgery

## 2019-02-26 ENCOUNTER — Other Ambulatory Visit: Payer: Self-pay

## 2019-02-26 VITALS — BP 126/88 | HR 77 | Ht 71.0 in | Wt 279.0 lb

## 2019-02-26 DIAGNOSIS — M170 Bilateral primary osteoarthritis of knee: Secondary | ICD-10-CM | POA: Diagnosis not present

## 2019-02-26 MED ORDER — LIDOCAINE HCL 1 % IJ SOLN
2.0000 mL | INTRAMUSCULAR | Status: AC | PRN
  Administered 2019-02-26: 2 mL

## 2019-02-26 MED ORDER — BUPIVACAINE HCL 0.5 % IJ SOLN
2.0000 mL | INTRAMUSCULAR | Status: AC | PRN
  Administered 2019-02-26: 2 mL via INTRA_ARTICULAR

## 2019-02-26 MED ORDER — METHYLPREDNISOLONE ACETATE 40 MG/ML IJ SUSP
80.0000 mg | INTRAMUSCULAR | Status: AC | PRN
  Administered 2019-02-26: 80 mg via INTRA_ARTICULAR

## 2019-02-26 NOTE — Progress Notes (Signed)
Office Visit Note   Patient: Carlos Crosby           Date of Birth: 03/01/1956           MRN: [id number] Visit Date: 02/26/2019              Requested by: Leone Haven, MD [address] [place],  [place] [address] PCP: Leone Haven, MD   Assessment & Plan: Visit Diagnoses:  1. Primary osteoarthritis of both knees     Plan: Recurrent symptoms of osteoarthritis both knees.  Will inject with cortisone.  Follow-up as needed Follow-Up Instructions: Return if symptoms worsen or fail to improve.   Orders:  Orders Placed This Encounter  Procedures  . Large Joint Inj: bilateral knee   No orders of the defined types were placed in this encounter.     Procedures: Large Joint Inj: bilateral knee on 02/26/2019 9:07 AM Indications: diagnostic evaluation Details: 25 G 1.5 in needle, anteromedial approach  Arthrogram: No  Medications (Right): 2 mL lidocaine 1 %; 2 mL bupivacaine 0.5 %; 80 mg methylPREDNISolone acetate 40 MG/ML Medications (Left): 2 mL lidocaine 1 %; 2 mL bupivacaine 0.5 %; 80 mg methylPREDNISolone acetate 40 MG/ML Outcome: tolerated well, no immediate complications Consent was given by the patient. Immediately prior to procedure a time out was called to verify the correct patient, procedure, equipment, support staff and site/side marked as required. Patient was prepped and draped in the usual sterile fashion.       Clinical Data: No additional findings.   Subjective: Chief Complaint  Patient presents with  . Right Knee - Pain  . Left Knee - Pain  Patient presents today for recurrent bilateral knee pain. He finished the orthovisc bilaterally in June. Patient states that the gel injections helped until a couple weeks ago. Both hurt equally as bad, but the left side has less range of motion. He is wanting to get bilateral cortisone injections today. He is not diabetic.  HPI  Review of Systems   Objective: Vital Signs: BP 126/88    Pulse 77   Ht 5\' 11"  (1.803 m)   Wt 279 lb (126.6 kg)   BMI 38.91 kg/m   Physical Exam  Ortho Exam increased varus both knees.  Multiple prior surgeries bilaterally bilateral patellar crepitation.  Small effusions.  Knees were not hot warm or red.  Flexed over 100 degrees without instability  Specialty Comments:  No specialty comments available.  Imaging: No results found.   PMFS History: Patient Active Problem List   Diagnosis Date Noted  . Fall 02/05/2019  . Neuropathy 11/16/2017  . Prediabetes 11/16/2017  . Vitamin D deficiency 11/16/2017  . Hemochromatosis, hereditary ([place]) 07/23/2017  . Bleeding nose 05/18/2017  . Allergic rhinitis 01/26/2017  . Hematuria 10/26/2016  . Blue nevus 05/16/2016  . Hair loss 05/16/2016  . Abdominal pain 05/16/2016  . Umbilical hernia without obstruction and without gangrene 05/16/2016  . Atrial fibrillation ([place]) 09/26/2014  . Morbid obesity ([place]) 09/26/2014  . Chronic diastolic congestive heart failure ([place]) 09/26/2014  . Anxiety 09/26/2014  . Obstructive sleep apnea on CPAP 09/26/2014  . Essential hypertension 09/26/2014  . Hyperlipidemia 09/26/2014  . Osteoarthritis of both knees 09/26/2014  . Kidney stone 09/26/2014   Past Medical History:  Diagnosis Date  . Chronic a-fib   . Chronic systolic CHF (congestive heart failure) ([place])   . Depression   . Hemochromatosis   . History of kidney stones   .  History of pulmonary embolism   . Hyperlipidemia   . Hypertension   . Migraine   . Obesity   . OSA (obstructive sleep apnea)    on CPAP  . Osteoarthritis    bilateral knees  . Rocky Mountain spotted fever 2011    Family History  Problem Relation Age of Onset  . Thyroid disease Mother   . Cancer Father        lung  . Arthritis Father   . Hypertension Father   . Cancer Paternal Grandfather        Throat cancer    Past Surgical History:  Procedure Laterality Date  . CARDIAC CATHETERIZATION  2012   [facility]  . KNEE SURGERY      right knee   . LITHOTRIPSY  07/31/2014  . URETERAL STENT PLACEMENT  2016  . URETEROSCOPY     Social History   Occupational History  . Not on file  Tobacco Use  . Smoking status: Never Smoker  . Smokeless tobacco: Never Used  Substance and Sexual Activity  . Alcohol use: No    Frequency: Never    Comment: occasional; 3 times a year  . Drug use: No  . Sexual activity: Not Currently

## 2019-02-26 NOTE — Progress Notes (Signed)
Virtual Visit via Video Note   This visit type was conducted due to national recommendations for restrictions regarding the COVID-19 Pandemic (e.g. social distancing) in an effort to limit this patient's exposure and mitigate transmission in our community.  Due to his co-morbid illnesses, this patient is at least at moderate risk for complications without adequate follow up.  This format is felt to be most appropriate for this patient at this time.  All issues noted in this document were discussed and addressed.  A limited physical exam was performed with this format.  Please refer to the patient's chart for his consent to telehealth for [facility].   I connected with  [name] on 02/27/19 by a video enabled telemedicine application and verified that I am speaking with the correct person using two identifiers. I discussed the limitations of evaluation and management by telemedicine. The patient expressed understanding and agreed to proceed.   Evaluation Performed:  Follow-up visit  Date:  02/27/2019   ID:  Carlos Crosby, DOB 1956-06-02, MRN [id number]  Patient Location:  [place] [address]   Provider location:   [facility], [place] office  PCP:  Leone Haven, MD  Cardiologist:  [place], [place]   Chief Complaint:  Weight loss    History of Present Illness:    Carlos Crosby is a 63 y.o. male who presents via audio/video conferencing for a telehealth visit today.   The patient does not symptoms concerning for COVID-19 infection (fever, chills, cough, or new SHORTNESS OF BREATH).   Patient has a past medical history of morbid obesity,  chronic atrial fibrillation (previously on tikosyn),  chronic systolic CHF with ejection fraction 25% in 2012,  Improved up to 45% - 50% cardiac cath: no CAD,   obstructive sleep apnea on CPAP,  hypertension,  Hyperlipidemia,  recurrent renal stones,  osteoarthritis of the knees bilaterally,  Previously noted to have elevated ventricular rate in the setting of anxiety, such as before procedures  depression  Dx with hemachromatosis who presents for routine follow-up of his atrial fibrillation   Did not get the sleeve Lost weight without, went down to 1500 calories-2000 No potatos, bread, carbs Prior weight was 350 Now to 279  Feels well, Knees better Swims 3-4 x a week  Goal is 220  103/80, pulse 88 no orthostasis  Digoxin and metoprolol has been cut back by Dr. 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 Phlebotomy monthly  Other past medical history reviewed ECHO 11/24/2015: EF 45 to 50% Stress test : 12/21/2015 No ischemia, EF 42% echo in 05/2015: EF 30 to 35%  outpatient left lithotripsy 08/28/2014  found to have atrial fibrillation with RVR with heart rates up to 170s prior to the procedure and this was canceled.   Prior CV studies:   The following studies were reviewed today: echo Left ventricle: The cavity size was mildly dilated. Systolic   function was normal. The estimated ejection fraction was in the   range of 50% to 55%. Regional wall motion abnormalities cannot be   excluded. The study is not technically sufficient to allow   evaluation of LV diastolic function. - Left atrium: The atrium was severely dilated. - Right ventricle: Systolic function was normal. - Right atrium: The atrium was moderately dilated. - Tricuspid  valve: There was moderate regurgitation. - Pulmonary arteries: Systolic pressure was mildly elevated. PA   peak pressure: 40 mm Hg (S).  - Rhythm is atrial fibrillation.   Past Medical History:  Diagnosis Date  . Chronic a-fib   . Chronic systolic CHF (congestive heart failure) ([place])   . Depression   . Hemochromatosis   . History of kidney stones   . History of  pulmonary embolism   . Hyperlipidemia   . Hypertension   . Migraine   . Obesity   . OSA (obstructive sleep apnea)    on CPAP  . Osteoarthritis    bilateral knees  . Rocky Mountain spotted fever 2011   Past Surgical History:  Procedure Laterality Date  . CARDIAC CATHETERIZATION  2012   [facility]  . KNEE SURGERY     right knee   . LITHOTRIPSY  07/31/2014  . URETERAL STENT PLACEMENT  2016  . URETEROSCOPY        Allergies:   Allopurinol, Other, Diltiazem, Penicillin g, Ondansetron, Ondansetron hcl, and Penicillins   Social History   Tobacco Use  . Smoking status: Never Smoker  . Smokeless tobacco: Never Used  Substance Use Topics  . Alcohol use: No    Frequency: Never    Comment: occasional; 3 times a year  . Drug use: No     Current Outpatient Medications on File Prior to Visit  Medication Sig Dispense Refill  . acetaminophen (TYLENOL) 500 MG tablet Take 1,000 mg by mouth every 6 (six) hours as needed.    . Cholecalciferol (VITAMIN D) 2000 units CAPS     . digoxin (LANOXIN) 0.125 MG tablet TAKE 1 TABLET SIX DAYS A WEEK AS DIRECTED 78 tablet 1  . ELIQUIS 5 MG TABS tablet TAKE 1 TABLET TWICE DAILY 180 tablet 1  . furosemide (LASIX) 40 MG tablet Take 40 mg by mouth daily as needed.     Marland Kitchen lisinopril (ZESTRIL) 2.5 MG tablet Take 1 tablet (2.5 mg total) by mouth daily. 90 tablet 3  . loratadine (CLARITIN) 10 MG tablet Take 10 mg by mouth daily.    . metoprolol succinate (TOPROL-XL) 100 MG 24 hr tablet TAKE 2 TABLETS EVERY MORNING AND TAKE 1 TABLET EVERY EVENING 270 tablet 0  . Multiple Vitamins-Minerals (CENTRUM SILVER ADULT 50+) TABS Take 1 tablet by mouth daily.    . Potassium 95 MG TABS Take 1 tablet by mouth.    . pravastatin (PRAVACHOL) 80 MG tablet TAKE 1 TABLET EVERY EVENING 90 tablet 2  . traMADol (ULTRAM) 50 MG tablet TAKE 1 TABLET(50 MG) BY MOUTH EVERY 6 HOURS AS NEEDED 90 tablet 0  . verapamil (CALAN-SR) 120 MG CR tablet TAKE 1 TABLET EVERY DAY 90 tablet 3   No current  facility-administered medications on file prior to visit.      Family Hx: The patient's family history includes Arthritis in his father; Cancer in his father and paternal grandfather; Hypertension in his father; Thyroid disease in his mother.  ROS:   Please see the history of present illness.    Review of Systems  Constitutional: Positive for weight loss.  HENT: Negative.   Respiratory: Negative.   Cardiovascular: Negative.   Gastrointestinal: Negative.   Musculoskeletal: Positive for joint pain.  Neurological: Negative.   Psychiatric/Behavioral: Negative.   All other systems reviewed and are negative.    Labs/Other Tests and Data Reviewed:    Recent Labs: 11/15/2018: Hemoglobin 17.4; Platelets 214 02/05/2019: ALT 20; BUN 20; Creatinine, Ser 0.85; Potassium  4.5; Sodium 138   Recent Lipid Panel Lab Results  Component Value Date/Time   CHOL 150 02/05/2019 10:05 AM   TRIG 135.0 02/05/2019 10:05 AM   HDL 40.70 02/05/2019 10:05 AM   CHOLHDL 4 02/05/2019 10:05 AM   LDLCALC 82 02/05/2019 10:05 AM   LDLDIRECT 91.0 05/18/2018 09:12 AM    Wt Readings from Last 3 Encounters:  02/27/19 279 lb (126.6 kg)  02/26/19 279 lb (126.6 kg)  02/05/19 291 lb 9.6 oz (132.3 kg)     Exam:    Vital Signs: Vital signs may also be detailed in the HPI BP 103/81   Pulse 88   Ht 5\' 11"  (1.803 m)   Wt 279 lb (126.6 kg)   BMI 38.91 kg/m   Wt Readings from Last 3 Encounters:  02/27/19 279 lb (126.6 kg)  02/26/19 279 lb (126.6 kg)  02/05/19 291 lb 9.6 oz (132.3 kg)   Temp Readings from Last 3 Encounters:  02/05/19 (!) 96.9 F (36.1 C) (Oral)  08/09/18 97.8 F (36.6 C) (Tympanic)  07/24/18 98.2 F (36.8 C) (Oral)   BP Readings from Last 3 Encounters:  02/27/19 103/81  02/26/19 126/88  02/05/19 115/70   Pulse Readings from Last 3 Encounters:  02/27/19 88  02/26/19 77  02/05/19 89     Well nourished, well developed male in no acute distress. Constitutional:  oriented to person,  place, and time. No distress.  Head: Normocephalic and atraumatic.  Eyes:  no discharge. No scleral icterus.  Neck: Normal range of motion. Neck supple.  Pulmonary/Chest: No audible wheezing, no distress, appears comfortable Musculoskeletal: Normal range of motion.  no  tenderness or deformity.  Neurological:   Coordination normal. Full exam not performed Skin:  No rash Psychiatric:  normal mood and affect. behavior is normal. Thought content normal.    ASSESSMENT & PLAN:    Problem List Items Addressed This Visit      Cardiology Problems   Atrial fibrillation ([place]) - Primary   Hyperlipidemia   Essential hypertension   Chronic diastolic congestive heart failure (H[facility])     Other   Morbid obesity ([place])    Other Visit Diagnoses    Cardiomyopathy, unspecified type ([place])          Permanent atrial fibrillation ([place]) On eliquis He is tolerating verapamil and metoprolol Will monitor BP, getting low with weight loss  Essential hypertension Monitor BP, running low May need to cut back on meds  Pure hypercholesterolemia Lipids good range  Morbid obesity ([place]) Losing weight  Anxiety takes Xanax  Long discussion today, reassurance provided  Obstructive sleep apnea on CPAP  compliant on his CPAP Needs adjusting with weight loss, he goes to [facility]  Chronic diastolic congestive heart failure (HCC) Lasix only as needed, has not been taking  COVID-19 Education: The signs and symptoms of COVID-19 were discussed with the patient and how to seek care for testing (follow up with PCP or arrange E-visit).  The importance of social distancing was discussed today.  Patient Risk:   After full review of this patients clinical status, I feel that they are at least moderate risk at this time.  Time:   Today, I have spent 45 minutes with the patient with telehealth technology discussing the cardiac and medical problems/diagnoses detailed above   Additional 10 min spent reviewing  the chart prior to patient visit today   Medication Adjustments/Labs and Tests Ordered: Current medicines are reviewed at length with the patient today.  Concerns regarding  medicines are outlined above.   Tests Ordered: No tests ordered  Medication Changes: No changes made   Disposition: Follow-up in 12 months  Signed, Ida Rogue, MD  [facility] [address], [place], [place] [address]

## 2019-02-27 ENCOUNTER — Telehealth (INDEPENDENT_AMBULATORY_CARE_PROVIDER_SITE_OTHER): Payer: Medicare HMO | Admitting: Cardiovascular Disease

## 2019-02-27 ENCOUNTER — Encounter: Payer: Self-pay | Admitting: Cardiovascular Disease

## 2019-02-27 VITALS — BP 103/81 | HR 88 | Ht 71.0 in | Wt 279.0 lb

## 2019-02-27 DIAGNOSIS — I5032 Chronic diastolic (congestive) heart failure: Secondary | ICD-10-CM | POA: Diagnosis not present

## 2019-02-27 DIAGNOSIS — I10 Essential (primary) hypertension: Secondary | ICD-10-CM | POA: Diagnosis not present

## 2019-02-27 DIAGNOSIS — I4819 Other persistent atrial fibrillation: Secondary | ICD-10-CM | POA: Diagnosis not present

## 2019-02-27 DIAGNOSIS — I429 Cardiomyopathy, unspecified: Secondary | ICD-10-CM

## 2019-02-27 DIAGNOSIS — E7800 Pure hypercholesterolemia, unspecified: Secondary | ICD-10-CM

## 2019-02-27 NOTE — Patient Instructions (Addendum)
Change appt with Dr. Caryl Comes, 6 months   Continue to monitor blood pressure If it runs low, we may need to cut back on some medications   Medication Instructions:  No changes  If you need a refill on your cardiac medications before your next appointment, please call your pharmacy.    Lab work: No new labs needed   If you have labs (blood work) drawn today and your tests are completely normal, you will receive your results only by: Marland Kitchen MyChart Message (if you have MyChart) OR . A paper copy in the mail If you have any lab test that is abnormal or we need to change your treatment, we will call you to review the results.   Testing/Procedures: No new testing needed   Follow-Up: At [facility], you and your health needs are our priority.  As part of our continuing mission to provide you with exceptional heart care, we have created designated Provider Care Teams.  These Care Teams include your primary Cardiologist (physician) and Advanced Practice Providers (APPs -  Physician Assistants and Nurse Practitioners) who all work together to provide you with the care you need, when you need it.  . You will need a follow up appointment in 12 months .   Please call our office 2 months in advance to schedule this appointment.    . Providers on your designated Care Team:   . Murray Hodgkins, NP . Christell Faith, PA-C . Marrianne Mood, PA-C  Any Other Special Instructions Will Be Listed Below (If Applicable).  For educational health videos Log in to : [email or web address] Or : [email or web address], password : [email or web address]

## 2019-03-08 ENCOUNTER — Encounter: Payer: Self-pay | Admitting: Oncology

## 2019-03-08 ENCOUNTER — Encounter: Payer: Self-pay | Admitting: Urology

## 2019-03-15 ENCOUNTER — Other Ambulatory Visit: Payer: Self-pay | Admitting: Family Medicine

## 2019-03-15 ENCOUNTER — Ambulatory Visit
Admission: RE | Admit: 2019-03-15 | Discharge: 2019-03-15 | Disposition: A | Discharge status: Home / Self Care | Payer: Medicare HMO | Source: Ambulatory Visit | Attending: Urology | Admitting: Urology

## 2019-03-15 ENCOUNTER — Other Ambulatory Visit: Payer: Self-pay

## 2019-03-15 ENCOUNTER — Ambulatory Visit
Admission: RE | Admit: 2019-03-15 | Discharge: 2019-03-15 | Disposition: A | Discharge status: Home / Self Care | Payer: Medicare HMO | Attending: Urology | Admitting: Urology

## 2019-03-15 DIAGNOSIS — N200 Calculus of kidney: Secondary | ICD-10-CM | POA: Diagnosis not present

## 2019-03-18 NOTE — Progress Notes (Signed)
[place]  [phone] [phone]) [phone]  ID: Carlos Crosby OB: 19-Aug-1955  MR#: [id number]  [id number]  Patient Care Team: Leone Haven, MD as PCP - General (Family Medicine)  I connected with Carlos Crosby on 03/23/19 at  1:00 PM EDT by video enabled telemedicine visit and verified that I am speaking with the correct person using two identifiers.   I discussed the limitations, risks, security and privacy concerns of performing an evaluation and management service by telemedicine and the availability of in-person appointments. I also discussed with the patient that there may be a patient responsible charge related to this service. The patient expressed understanding and agreed to proceed.   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  INTERVAL HISTORY: Patient agreed to evaluation and discussion of his laboratory work via video enabled telemedicine visit.  He continues to feel well and remains asymptomatic.  He continues to have intentional weight loss. He has no neurologic complaints.  He denies any recent fevers or illnesses.  He denies any chest pain, shortness of breath, cough, or hemoptysis.  He denies any nausea, vomiting, constipation, or diarrhea.  He has no urinary complaints.  Patient offers no specific complaints today.  REVIEW OF SYSTEMS:   Review of Systems  Constitutional: Positive for weight loss. Negative for fever and malaise/fatigue.  Respiratory: Negative.  Negative for cough, hemoptysis and shortness of breath.   Cardiovascular: Negative.  Negative for chest pain and leg swelling.  Gastrointestinal: Negative.  Negative for abdominal pain, blood in stool and melena.  Genitourinary: Negative.  Negative for hematuria.  Musculoskeletal: Negative.   Negative for back pain.  Skin: Negative.  Negative for rash.  Neurological: Negative.  Negative for sensory change, focal weakness, weakness and headaches.  Psychiatric/Behavioral: Negative.  The patient is not nervous/anxious.     As per HPI. Otherwise, a complete review of systems is negative.  PAST MEDICAL HISTORY: Past Medical History:  Diagnosis Date  . Chronic a-fib ([place])   . Chronic systolic CHF (congestive heart failure) ([place])   . Depression   . Hemochromatosis   . History of kidney stones   . History of pulmonary embolism   . Hyperlipidemia   . Hypertension   . Migraine   . Obesity   . OSA (obstructive sleep apnea)    on CPAP  . Osteoarthritis    bilateral knees  . Rocky Mountain spotted fever 2011    PAST SURGICAL HISTORY: Past Surgical History:  Procedure Laterality Date  . CARDIAC CATHETERIZATION  2012   [facility]  . KNEE SURGERY     right knee   . LITHOTRIPSY  07/31/2014  . URETERAL STENT PLACEMENT  2016  . URETEROSCOPY      FAMILY HISTORY: Family History  Problem Relation Age of Onset  . Thyroid disease Mother   . Cancer Father        lung  . Arthritis Father   . Hypertension Father   . Cancer Paternal Grandfather        Throat cancer    ADVANCED DIRECTIVES (Y/N):  N  HEALTH MAINTENANCE: Social History   Tobacco Use  . Smoking status: Never Smoker  . Smokeless tobacco: Never Used  Substance Use Topics  . Alcohol use: No    Frequency: Never    Comment: occasional; 3 times a year  .  Drug use: No     Colonoscopy:  PAP:  Bone density:  Lipid panel:  Allergies  Allergen Reactions  . Allopurinol Hives and Swelling  . Other Hives  . Diltiazem Itching and Rash  . Penicillin G Nausea Only    And dirrhea  . Ondansetron Rash  . Ondansetron Hcl Rash  . Penicillins Other (See Comments) and Nausea Only    Pt unsure of rxn; showed up on allergy test Pt unsure of rxn; showed up on allergy test Pt unsure of rxn; showed up on allergy test And  dirrhea     Current Outpatient Medications  Medication Sig Dispense Refill  . acetaminophen (TYLENOL) 500 MG tablet Take 1,000 mg by mouth every 6 (six) hours as needed.    . Cholecalciferol (VITAMIN D) 2000 units CAPS     . digoxin (LANOXIN) 0.125 MG tablet TAKE 1 TABLET SIX DAYS A WEEK AS DIRECTED 78 tablet 1  . ELIQUIS 5 MG TABS tablet TAKE 1 TABLET TWICE DAILY 180 tablet 1  . furosemide (LASIX) 40 MG tablet Take 40 mg by mouth daily as needed.     Marland Kitchen lisinopril (ZESTRIL) 2.5 MG tablet Take 1 tablet (2.5 mg total) by mouth daily. 90 tablet 3  . loratadine (CLARITIN) 10 MG tablet Take 10 mg by mouth daily.    . metoprolol succinate (TOPROL-XL) 100 MG 24 hr tablet TAKE 2 TABLETS EVERY MORNING AND TAKE 1 TABLET EVERY EVENING 270 tablet 0  . Multiple Vitamins-Minerals (CENTRUM SILVER ADULT 50+) TABS Take 1 tablet by mouth daily.    . Potassium 95 MG TABS Take 1 tablet by mouth.    . pravastatin (PRAVACHOL) 80 MG tablet TAKE 1 TABLET EVERY EVENING 90 tablet 2  . traMADol (ULTRAM) 50 MG tablet TAKE 1 TABLET(50 MG) BY MOUTH EVERY 6 HOURS AS NEEDED 90 tablet 0  . verapamil (CALAN-SR) 120 MG CR tablet TAKE 1 TABLET EVERY DAY 90 tablet 3   No current facility-administered medications for this visit.     OBJECTIVE: There were no vitals filed for this visit.   There is no height or weight on file to calculate BMI.    ECOG FS:0 - Asymptomatic  General: Well-developed, well-nourished, no acute distress. HEENT: Normocephalic. Neuro: Alert, answering all questions appropriately. Cranial nerves grossly intact. Psych: Normal affect.  LAB RESULTS:  Lab Results  Component Value Date   NA 138 02/05/2019   K 4.5 02/05/2019   CL 102 02/05/2019   CO2 27 02/05/2019   GLUCOSE 86 02/05/2019   BUN 20 02/05/2019   CREATININE 0.85 02/05/2019   CALCIUM 9.4 02/05/2019   PROT 6.7 02/05/2019   ALBUMIN 4.3 02/05/2019   AST 18 02/05/2019   ALT 20 02/05/2019   ALKPHOS 41 02/05/2019   BILITOT 1.1  02/05/2019   GFRNONAA >60 03/30/2017   GFRAA >60 03/30/2017    Lab Results  Component Value Date   WBC 6.7 03/21/2019   NEUTROABS 3.5 03/21/2019   HGB 16.5 03/21/2019   HCT 47.3 03/21/2019   MCV 96.1 03/21/2019   PLT 197 03/21/2019   Lab Results  Component Value Date   IRON 105 03/21/2019   TIBC 338 03/21/2019   IRONPCTSAT 31 03/21/2019   Lab Results  Component Value Date   FERRITIN 106 03/21/2019     STUDIES: Dg Abd 1 View  Result Date: 03/15/2019 CLINICAL DATA:  Routine xray per pt f/u kidney stones. Pt has x-ray every 6 months per pt. Pt is in no pain.  EXAM: ABDOMEN - 1 VIEW COMPARISON:  Abdominal radiograph 03/16/2018 FINDINGS: Three small radiopaque calculi measuring up to 4 mm project over the inferior pole of the left kidney. There are no definite right renal calculi. Radiopaque densities in the pelvis likely represent vascular phleboliths, similar to prior. There are no dilated loops of bowel to suggest obstruction. No evidence of free air. No acute finding in the visualized skeleton. IMPRESSION: A few small calculi are seen in the inferior pole of the left kidney measuring up to 4 mm. No definite right renal calculi. Electronically Signed   By: Audie Pinto M.D.   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  Goal ferritin is 50-100.  Previously, the remainder of his laboratory work is either negative or within normal limits.  He does not require phlebotomy today.  Patient last received phlebotomy on July 17, 2018.  Because patient is on Eliquis, he cannot receive his phlebotomy through [facility].  Return to clinic in February 2021 or 1 year after his last lithotomy for repeat laboratory work and further evaluation. 2.  Weight loss: Intentional.  Patient states he  does not require gastric bypass at this time.  I provided 15 minutes of face-to-face video visit time during this encounter, and > 50% was spent counseling as documented under my assessment & plan.   Patient expressed understanding and was in agreement with this plan. He also understands that He can call clinic at any time with any questions, concerns, or complaints.    Lloyd Huger, MD   03/23/2019 7:51 PM

## 2019-03-19 ENCOUNTER — Other Ambulatory Visit: Payer: Self-pay

## 2019-03-19 ENCOUNTER — Encounter: Payer: Self-pay | Admitting: Urology

## 2019-03-19 ENCOUNTER — Ambulatory Visit (INDEPENDENT_AMBULATORY_CARE_PROVIDER_SITE_OTHER): Payer: Medicare HMO | Admitting: Urology

## 2019-03-19 VITALS — BP 125/89 | HR 81 | Ht 71.0 in | Wt 293.0 lb

## 2019-03-19 DIAGNOSIS — Z125 Encounter for screening for malignant neoplasm of prostate: Secondary | ICD-10-CM | POA: Diagnosis not present

## 2019-03-19 DIAGNOSIS — N200 Calculus of kidney: Secondary | ICD-10-CM | POA: Diagnosis not present

## 2019-03-19 NOTE — Progress Notes (Signed)
03/19/2019 11:56 AM   [name] [birth year] [id number]  Referring provider: Leone Haven, MD [address] [place],  [place] [address]  Chief Complaint  Patient presents with  . Nephrolithiasis    HPI: 63 year old male with personal history of nephrolithiasis who returns today for routine annual follow-up.  He was last seen on 03/2018.  He reports that he had a twinge of discomfort on the left side quite some time ago which resolved spontaneously.  He did not see a stone pass.  No recurrent flank pain on that side.  KUB today shows 4 mm nonobstructing left-sided stone.  Since last visit, he has lost a significant amount of weight with diet and exercise.  He had been previously considering bariatric surgery but given his success with behavioral modification, he is elected not to pursue surgery and continues to diet and swimming several times a week.  He is drinking a very large amount of water.  He is drinking at least 100 ounces per day.  Previous stone analysis from 2016 showed 79% calcium oxalate monohydrate, 10% calcium oxalate dihydrate, 15% calcium phosphate.He underwent 123456 urine metabolic work-up which was fairly unremarkable. Please see previous note for details.  He is on multiple stone procedures in the past including PCNL, ureteroscopy and shockwave lithotripsy.    He does have a personal history of gout. He does report that his uric acid level was markedly elevated in the past. He is allergic to allopurinol.  He denies any symptoms today.  He does inquire about PSA screening and mentions he does not believe patient was done in a few years.  His last PSA was over 2 years ago which was very low.  No recent rectal exams.   PMH: Past Medical History:  Diagnosis Date  . Chronic a-fib ([place])   . Chronic systolic CHF (congestive heart failure) ([place])   . Depression   . Hemochromatosis   . History of kidney stones   . History of pulmonary  embolism   . Hyperlipidemia   . Hypertension   . Migraine   . Obesity   . OSA (obstructive sleep apnea)    on CPAP  . Osteoarthritis    bilateral knees  . [place] spotted fever 2011    Surgical History: Past Surgical History:  Procedure Laterality Date  . CARDIAC CATHETERIZATION  2012   [facility]  . KNEE SURGERY     right knee   . LITHOTRIPSY  07/31/2014  . URETERAL STENT PLACEMENT  2016  . URETEROSCOPY      Home Medications:  Allergies as of 03/19/2019      Reactions   Allopurinol Hives, Swelling   Other Hives   Diltiazem Itching, Rash   Penicillin G Nausea Only   And dirrhea   Ondansetron Rash   Ondansetron Hcl Rash   Penicillins Other (See Comments), Nausea Only   Pt unsure of rxn; showed up on allergy test Pt unsure of rxn; showed up on allergy test Pt unsure of rxn; showed up on allergy test And dirrhea      Medication List       Accurate as of March 19, 2019 11:56 AM. If you have any questions, ask your nurse or doctor.        acetaminophen 500 MG tablet Commonly known as: TYLENOL Take 1,000 mg by mouth every 6 (six) hours as needed.   Centrum Silver Adult 50+ Tabs Take 1 tablet by mouth daily.   digoxin 0.125  MG tablet Commonly known as: LANOXIN TAKE 1 TABLET SIX DAYS A WEEK AS DIRECTED   Eliquis 5 MG Tabs tablet Generic drug: apixaban TAKE 1 TABLET TWICE DAILY   furosemide 40 MG tablet Commonly known as: LASIX Take 40 mg by mouth daily as needed.   lisinopril 2.5 MG tablet Commonly known as: ZESTRIL Take 1 tablet (2.5 mg total) by mouth daily.   loratadine 10 MG tablet Commonly known as: CLARITIN Take 10 mg by mouth daily.   metoprolol succinate 100 MG 24 hr tablet Commonly known as: TOPROL-XL TAKE 2 TABLETS EVERY MORNING AND TAKE 1 TABLET EVERY EVENING   Potassium 95 MG Tabs Take 1 tablet by mouth.   pravastatin 80 MG tablet Commonly known as: PRAVACHOL TAKE 1 TABLET EVERY EVENING   traMADol 50 MG tablet Commonly known  as: ULTRAM TAKE 1 TABLET(50 MG) BY MOUTH EVERY 6 HOURS AS NEEDED   verapamil 120 MG CR tablet Commonly known as: CALAN-SR TAKE 1 TABLET EVERY DAY   Vitamin D 50 MCG (2000 UT) Caps       Allergies:  Allergies  Allergen Reactions  . Allopurinol Hives and Swelling  . Other Hives  . Diltiazem Itching and Rash  . Penicillin G Nausea Only    And dirrhea  . Ondansetron Rash  . Ondansetron Hcl Rash  . Penicillins Other (See Comments) and Nausea Only    Pt unsure of rxn; showed up on allergy test Pt unsure of rxn; showed up on allergy test Pt unsure of rxn; showed up on allergy test And dirrhea     Family History: Family History  Problem Relation Age of Onset  . Thyroid disease Mother   . Cancer Father        lung  . Arthritis Father   . Hypertension Father   . Cancer Paternal Grandfather        Throat cancer    Social History:  reports that he has never smoked. He has never used smokeless tobacco. He reports that he does not drink alcohol or use drugs.  ROS: UROLOGY Frequent Urination?: No Hard to postpone urination?: No Burning/pain with urination?: No Get up at night to urinate?: No Leakage of urine?: No Urine stream starts and stops?: No Trouble starting stream?: No Do you have to strain to urinate?: No Blood in urine?: No Urinary tract infection?: No Sexually transmitted disease?: No Injury to kidneys or bladder?: No Painful intercourse?: No Weak stream?: No Erection problems?: No Penile pain?: No  Gastrointestinal Nausea?: No Vomiting?: No Indigestion/heartburn?: No Diarrhea?: No Constipation?: No  Constitutional Fever: No Night sweats?: No Weight loss?: No Fatigue?: No  Skin Skin rash/lesions?: No Itching?: No  Eyes Blurred vision?: No Double vision?: No  Ears/Nose/Throat Sore throat?: No Sinus problems?: No  Hematologic/Lymphatic Swollen glands?: No Easy bruising?: No  Cardiovascular Leg swelling?: No Chest pain?: No   Respiratory Cough?: No Shortness of breath?: No  Endocrine Excessive thirst?: No  Musculoskeletal Back pain?: No Joint pain?: No  Neurological Headaches?: No Dizziness?: No  Psychologic Depression?: No Anxiety?: No  Physical Exam: BP 125/89   Pulse 81   Ht 5\' 11"  (1.803 m)   Wt 293 lb (132.9 kg)   BMI 40.87 kg/m   Constitutional:  Alert and oriented, No acute distress. HEENT: [place] AT, moist mucus membranes.  Trachea midline, no masses. Cardiovascular: No clubbing, cyanosis, or edema. Respiratory: Normal respiratory effort, no increased work of breathing. GI: Abdomen is soft, nontender, nondistended, no abdominal masses, obese. Rectal:  Normal sphincter tone.  Small 20 cc prostate, nontender no nodules Skin: No rashes, bruises or suspicious lesions. Neurologic: Grossly intact, no focal deficits, moving all 4 extremities. Psychiatric: Normal mood and affect.  Laboratory Data: Lab Results  Component Value Date   WBC 7.1 11/15/2018   HGB 17.4 (H) 11/15/2018   HCT 49.0 11/15/2018   MCV 93.9 11/15/2018   PLT 214 11/15/2018    Lab Results  Component Value Date   CREATININE 0.85 02/05/2019    Lab Results  Component Value Date   PSA 0.16 01/26/2017    Lab Results  Component Value Date   HGBA1C 5.3 02/05/2019     Pertinent Imaging: Results for orders placed during the hospital encounter of 03/15/19  DG Abd 1 View   Narrative CLINICAL DATA:  Routine xray per pt f/u kidney stones. Pt has x-ray every 6 months per pt. Pt is in no pain.  EXAM: ABDOMEN - 1 VIEW  COMPARISON:  Abdominal radiograph 03/16/2018  FINDINGS: Three small radiopaque calculi measuring up to 4 mm project over the inferior pole of the left kidney. There are no definite right renal calculi. Radiopaque densities in the pelvis likely represent vascular phleboliths, similar to prior.  There are no dilated loops of bowel to suggest obstruction. No evidence of free air. No acute finding in  the visualized skeleton.  IMPRESSION: A few small calculi are seen in the inferior pole of the left kidney measuring up to 4 mm. No definite right renal calculi.   Electronically Signed   By: Audie Pinto M.D.   On: 03/15/2019 20:02    KUB was personally reviewed today.  Agree with the radiologic interpretation, few punctate small stones in left kidney but no significant stone burden.  Assessment & Plan:    1. Kidney stone left side Overall stone burden essentially stable without any significant stone episodes this year  Continue behavioral modification with pushing fluids  Given the very small size of stones, would recommend continued conservative management with surveillance rather than surgical intervention  Plan for KUB in 2 years or sooner as needed  - PSA  2. Screening PSA (prostate specific antigen) Requested PSA screening today, PSA drawn and rectal exam unremarkable   Return in about 2 years (around 03/18/2021) for KUB.  Hollice Espy, MD  [facility] [address], [place], [place] [address] [phone]

## 2019-03-20 ENCOUNTER — Telehealth: Payer: Self-pay | Admitting: *Deleted

## 2019-03-20 LAB — PSA: Prostate Specific Ag, Serum: 0.2 ng/mL (ref 0.0–4.0)

## 2019-03-20 NOTE — Telephone Encounter (Addendum)
Patient informed-verbalized understanding  ----- Message from Hollice Espy, MD sent at 03/20/2019  8:03 AM EDT ----- PSA is very low, great news.  PSA is 0.2   Hollice Espy, MD

## 2019-03-20 NOTE — Telephone Encounter (Signed)
Error

## 2019-03-21 ENCOUNTER — Other Ambulatory Visit: Payer: Self-pay

## 2019-03-21 ENCOUNTER — Encounter: Payer: Self-pay | Admitting: Oncology

## 2019-03-21 ENCOUNTER — Inpatient Hospital Stay: Payer: Medicare HMO | Attending: Oncology

## 2019-03-21 DIAGNOSIS — E669 Obesity, unspecified: Secondary | ICD-10-CM | POA: Insufficient documentation

## 2019-03-21 DIAGNOSIS — I10 Essential (primary) hypertension: Secondary | ICD-10-CM | POA: Insufficient documentation

## 2019-03-21 DIAGNOSIS — G4733 Obstructive sleep apnea (adult) (pediatric): Secondary | ICD-10-CM | POA: Diagnosis not present

## 2019-03-21 DIAGNOSIS — Z86711 Personal history of pulmonary embolism: Secondary | ICD-10-CM | POA: Insufficient documentation

## 2019-03-21 DIAGNOSIS — E785 Hyperlipidemia, unspecified: Secondary | ICD-10-CM | POA: Diagnosis not present

## 2019-03-21 DIAGNOSIS — Z79899 Other long term (current) drug therapy: Secondary | ICD-10-CM | POA: Insufficient documentation

## 2019-03-21 DIAGNOSIS — Z7901 Long term (current) use of anticoagulants: Secondary | ICD-10-CM | POA: Diagnosis not present

## 2019-03-21 LAB — CBC WITH DIFFERENTIAL/PLATELET
Abs Immature Granulocytes: 0.05 10*3/uL (ref 0.00–0.07)
Basophils Absolute: 0.1 10*3/uL (ref 0.0–0.1)
Basophils Relative: 1 %
Eosinophils Absolute: 0.2 10*3/uL (ref 0.0–0.5)
Eosinophils Relative: 3 %
HCT: 47.3 % (ref 39.0–52.0)
Hemoglobin: 16.5 g/dL (ref 13.0–17.0)
Immature Granulocytes: 1 %
Lymphocytes Relative: 35 %
Lymphs Abs: 2.3 10*3/uL (ref 0.7–4.0)
MCH: 33.5 pg (ref 26.0–34.0)
MCHC: 34.9 g/dL (ref 30.0–36.0)
MCV: 96.1 fL (ref 80.0–100.0)
Monocytes Absolute: 0.6 10*3/uL (ref 0.1–1.0)
Monocytes Relative: 9 %
Neutro Abs: 3.5 10*3/uL (ref 1.7–7.7)
Neutrophils Relative %: 51 %
Platelets: 197 10*3/uL (ref 150–400)
RBC: 4.92 MIL/uL (ref 4.22–5.81)
RDW: 12.7 % (ref 11.5–15.5)
WBC: 6.7 10*3/uL (ref 4.0–10.5)
nRBC: 0 % (ref 0.0–0.2)

## 2019-03-21 LAB — IRON AND TIBC
Iron: 105 ug/dL (ref 45–182)
Saturation Ratios: 31 % (ref 17.9–39.5)
TIBC: 338 ug/dL (ref 250–450)
UIBC: 233 ug/dL

## 2019-03-21 LAB — FERRITIN: Ferritin: 106 ng/mL (ref 24–336)

## 2019-03-21 NOTE — Progress Notes (Signed)
Called patient to prechart for Telehealth visit via [place].  Patient denies any concerns and states " I feel really good".  Patient asks for Dr Grayland Ormond to review labs and advise if phlebotomy is needed, if not needed requests for a virtual visit.

## 2019-03-22 ENCOUNTER — Other Ambulatory Visit: Payer: Medicare HMO

## 2019-03-22 ENCOUNTER — Inpatient Hospital Stay: Payer: Medicare HMO

## 2019-03-22 ENCOUNTER — Inpatient Hospital Stay (HOSPITAL_BASED_OUTPATIENT_CLINIC_OR_DEPARTMENT_OTHER): Payer: Medicare HMO | Admitting: Oncology

## 2019-03-25 ENCOUNTER — Other Ambulatory Visit: Payer: Self-pay

## 2019-03-25 MED ORDER — METOPROLOL SUCCINATE ER 100 MG PO TB24: ORAL_TABLET | ORAL | 0 refills | Status: DC

## 2019-03-26 ENCOUNTER — Other Ambulatory Visit: Payer: Self-pay | Admitting: *Deleted

## 2019-03-26 MED ORDER — METOPROLOL SUCCINATE ER 100 MG PO TB24: ORAL_TABLET | ORAL | 1 refills | Status: DC

## 2019-04-03 ENCOUNTER — Other Ambulatory Visit: Payer: Self-pay

## 2019-04-03 DIAGNOSIS — Z20822 Contact with and (suspected) exposure to covid-19: Secondary | ICD-10-CM

## 2019-04-04 LAB — NOVEL CORONAVIRUS, NAA: SARS-CoV-2, NAA: NOT DETECTED

## 2019-04-08 ENCOUNTER — Other Ambulatory Visit: Payer: Self-pay | Admitting: Cardiovascular Disease

## 2019-04-11 ENCOUNTER — Telehealth: Payer: Self-pay | Admitting: Orthopaedic Surgery

## 2019-04-11 ENCOUNTER — Encounter: Payer: Self-pay | Admitting: Orthopaedic Surgery

## 2019-04-11 NOTE — Telephone Encounter (Signed)
Please precert for bilateral visco injections. 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

## 2019-04-15 NOTE — Telephone Encounter (Signed)
Submitted online [facility] for bil Monovisc injections.

## 2019-04-16 NOTE — Telephone Encounter (Signed)
Still in progress per online.

## 2019-04-17 NOTE — Telephone Encounter (Signed)
Can you please call patient and schedule him for bil knees Monovisc with Dr Durward Fortes?  Tell him he will owe an estimated $400 out of pocket once insurance is billed.  No payment due on date of service.  Thanks!  Buy and bill ok, no PA needed.

## 2019-04-22 DIAGNOSIS — H524 Presbyopia: Secondary | ICD-10-CM | POA: Diagnosis not present

## 2019-04-25 ENCOUNTER — Telehealth: Payer: Self-pay

## 2019-04-25 NOTE — Telephone Encounter (Signed)
Carlos Crosby pt     

## 2019-04-26 MED ORDER — TRAMADOL HCL 50 MG PO TABS: ORAL_TABLET | ORAL | 0 refills | Status: DC

## 2019-04-26 NOTE — Telephone Encounter (Signed)
rx sent in for tramadol.

## 2019-05-13 DIAGNOSIS — G4733 Obstructive sleep apnea (adult) (pediatric): Secondary | ICD-10-CM | POA: Diagnosis not present

## 2019-05-13 DIAGNOSIS — G4730 Sleep apnea, unspecified: Secondary | ICD-10-CM | POA: Diagnosis not present

## 2019-05-15 ENCOUNTER — Encounter: Payer: Self-pay | Admitting: Orthopaedic Surgery

## 2019-05-15 ENCOUNTER — Other Ambulatory Visit: Payer: Self-pay

## 2019-05-15 ENCOUNTER — Ambulatory Visit (INDEPENDENT_AMBULATORY_CARE_PROVIDER_SITE_OTHER): Payer: Medicare HMO | Admitting: Orthopaedic Surgery

## 2019-05-15 VITALS — Ht 71.0 in | Wt 293.0 lb

## 2019-05-15 DIAGNOSIS — M170 Bilateral primary osteoarthritis of knee: Secondary | ICD-10-CM | POA: Diagnosis not present

## 2019-05-15 MED ORDER — HYALURONAN 88 MG/4ML IX SOSY
88.0000 mg | PREFILLED_SYRINGE | INTRA_ARTICULAR | Status: AC | PRN
  Administered 2019-05-15: 88 mg via INTRA_ARTICULAR

## 2019-05-15 NOTE — Progress Notes (Signed)
Office Visit Note   Patient: Carlos Crosby           Date of Birth: 09-09-1955           MRN: [id number] Visit Date: 05/15/2019              Requested by: Leone Haven, MD [address] [place],  [place] [address] PCP: Leone Haven, MD   Assessment & Plan: Visit Diagnoses:  1. Primary osteoarthritis of both knees     Plan: Single injection of Monovisc viscosupplementation both knees today.  He will follow-up as needed  Follow-Up Instructions: Return if symptoms worsen or fail to improve.   Orders:  Orders Placed This Encounter  Procedures  . Large Joint Inj: bilateral knee   No orders of the defined types were placed in this encounter.     Procedures: Large Joint Inj: bilateral knee on 05/15/2019 11:18 AM Indications: pain and joint swelling Details: 25 G 1.5 in needle, anteromedial approach  Arthrogram: No  Medications (Right): 88 mg Hyaluronan 88 MG/4ML Medications (Left): 88 mg Hyaluronan 88 MG/4ML Outcome: tolerated well, no immediate complications Procedure, treatment alternatives, risks and benefits explained, specific risks discussed. Consent was given by the patient. Immediately prior to procedure a time out was called to verify the correct patient, procedure, equipment, support staff and site/side marked as required. Patient was prepped and draped in the usual sterile fashion.       Clinical Data: No additional findings.   Subjective: Chief Complaint  Patient presents with  . Right Knee - Pain    Monovisc started 05/15/2019  . Left Knee - Pain  Patient presents today for bilateral monovisc injections. He takes tramadol for pain.   HPI  Review of Systems   Objective: Vital Signs: Ht 5\' 11"  (1.803 m)   Wt 293 lb (132.9 kg)   BMI 40.87 kg/m   Physical Exam  Ortho Exam small effusions both knees with increased varus.  Lacks a few degrees to full extension bilaterally but flexed over 105 degrees.  No instability.  Knees  were not hot red warm or swollen  Specialty Comments:  No specialty comments available.  Imaging: No results found.   PMFS History: Patient Active Problem List   Diagnosis Date Noted  . Fall 02/05/2019  . Neuropathy 11/16/2017  . Prediabetes 11/16/2017  . Vitamin D deficiency 11/16/2017  . Hemochromatosis, hereditary ([place]) 07/23/2017  . Bleeding nose 05/18/2017  . Allergic rhinitis 01/26/2017  . Hematuria 10/26/2016  . Blue nevus 05/16/2016  . Hair loss 05/16/2016  . Abdominal pain 05/16/2016  . Umbilical hernia without obstruction and without gangrene 05/16/2016  . Atrial fibrillation ([place]) 09/26/2014  . Morbid obesity ([place]) 09/26/2014  . Chronic diastolic congestive heart failure ([place]) 09/26/2014  . Anxiety 09/26/2014  . Obstructive sleep apnea on CPAP 09/26/2014  . Essential hypertension 09/26/2014  . Hyperlipidemia 09/26/2014  . Osteoarthritis of both knees 09/26/2014  . Kidney stone 09/26/2014   Past Medical History:  Diagnosis Date  . Chronic a-fib ([place])   . Chronic systolic CHF (congestive heart failure) ([place])   . Depression   . Hemochromatosis   . History of kidney stones   . History of pulmonary embolism   . Hyperlipidemia   . Hypertension   . Migraine   . Obesity   . OSA (obstructive sleep apnea)    on CPAP  . Osteoarthritis    bilateral knees  . [facility] spotted fever 2011  Family History  Problem Relation Age of Onset  . Thyroid disease Mother   . Cancer Father        lung  . Arthritis Father   . Hypertension Father   . Cancer Paternal Grandfather        Throat cancer    Past Surgical History:  Procedure Laterality Date  . CARDIAC CATHETERIZATION  2012   [facility]  . KNEE SURGERY     right knee   . LITHOTRIPSY  07/31/2014  . URETERAL STENT PLACEMENT  2016  . URETEROSCOPY     Social History   Occupational History  . Not on file  Tobacco Use  . Smoking status: Never Smoker  . Smokeless tobacco: Never Used  Substance and Sexual  Activity  . Alcohol use: No    Frequency: Never    Comment: occasional; 3 times a year  . Drug use: No  . Sexual activity: Not Currently

## 2019-05-26 ENCOUNTER — Encounter: Payer: Self-pay | Admitting: Family Medicine

## 2019-05-27 MED ORDER — TRAMADOL HCL 50 MG PO TABS: ORAL_TABLET | ORAL | 0 refills | Status: DC

## 2019-06-05 ENCOUNTER — Other Ambulatory Visit: Payer: Self-pay | Admitting: Cardiovascular Disease

## 2019-06-10 ENCOUNTER — Other Ambulatory Visit: Payer: Self-pay

## 2019-06-10 ENCOUNTER — Ambulatory Visit (INDEPENDENT_AMBULATORY_CARE_PROVIDER_SITE_OTHER): Payer: Medicare HMO | Admitting: Family Medicine

## 2019-06-10 ENCOUNTER — Other Ambulatory Visit: Payer: Self-pay | Admitting: *Deleted

## 2019-06-10 ENCOUNTER — Encounter: Payer: Self-pay | Admitting: Family Medicine

## 2019-06-10 DIAGNOSIS — I10 Essential (primary) hypertension: Secondary | ICD-10-CM

## 2019-06-10 DIAGNOSIS — M170 Bilateral primary osteoarthritis of knee: Secondary | ICD-10-CM

## 2019-06-10 DIAGNOSIS — I4891 Unspecified atrial fibrillation: Secondary | ICD-10-CM

## 2019-06-10 MED ORDER — METOPROLOL SUCCINATE ER 100 MG PO TB24: ORAL_TABLET | ORAL | 3 refills | Status: DC

## 2019-06-10 NOTE — Progress Notes (Signed)
Virtual Visit via telephone Note  This visit type was conducted due to national recommendations for restrictions regarding the COVID-19 pandemic (e.g. social distancing).  This format is felt to be most appropriate for this patient at this time.  All issues noted in this document were discussed and addressed.  No physical exam was performed (except for noted visual exam findings with Video Visits).   I connected with Carlos Crosby today at  9:30 AM EST by telephone and verified that I am speaking with the correct person using two identifiers. Location patient: car Location provider: work  Persons participating in the virtual visit: patient, provider  I discussed the limitations, risks, security and privacy concerns of performing an evaluation and management service by telephone and the availability of in person appointments. I also discussed with the patient that there may be a patient responsible charge related to this service. The patient expressed understanding and agreed to proceed.  Interactive audio and video telecommunications were attempted between this provider and patient, however failed, due to patient having technical difficulties OR patient did not have access to video capability.  We continued and completed visit with audio only.   Reason for visit: follow-up  HPI: Fall: Patient had a fall when his dog knocked him over.  His dog weighs 90 pounds.  He notes no injury or head injury.  Hypertension/A. fib: He notes his heart rate is typically in the 70s.  He continues on verapamil, metoprolol, lisinopril, digoxin, and Eliquis.  No chest pain, shortness of breath, edema, palpitations, or bleeding issues.  Osteoarthritis of the knees: He takes tramadol.  It does make him somewhat drowsy though it is helpful.  He does not take it frequently.  He gets gel shots at night and that has helped some.  He is able to walk better with these.  Hemochromatosis: Has not had phlebotomy in almost a  year.  He does follow-up with hematology in the near future and will likely have phlebotomy at that time.   ROS: See pertinent positives and negatives per HPI.  Past Medical History:  Diagnosis Date  . Chronic a-fib ([place])   . Chronic systolic CHF (congestive heart failure) ([place])   . Depression   . Hemochromatosis   . History of kidney stones   . History of pulmonary embolism   . Hyperlipidemia   . Hypertension   . Migraine   . Obesity   . OSA (obstructive sleep apnea)    on CPAP  . Osteoarthritis    bilateral knees  . Rocky Mountain spotted fever 2011    Past Surgical History:  Procedure Laterality Date  . CARDIAC CATHETERIZATION  2012   [facility]  . KNEE SURGERY     right knee   . LITHOTRIPSY  07/31/2014  . URETERAL STENT PLACEMENT  2016  . URETEROSCOPY      Family History  Problem Relation Age of Onset  . Thyroid disease Mother   . Cancer Father        lung  . Arthritis Father   . Hypertension Father   . Cancer Paternal Grandfather        Throat cancer    SOCIAL HX: Non-smoker.   Current Outpatient Medications:  .  acetaminophen (TYLENOL) 500 MG tablet, Take 1,000 mg by mouth every 6 (six) hours as needed., Disp: , Rfl:  .  Cholecalciferol (VITAMIN D) 2000 units CAPS, , Disp: , Rfl:  .  digoxin (LANOXIN) 0.125 MG tablet, TAKE 1 TABLET SIX DAYS  A WEEK AS DIRECTED, Disp: 78 tablet, Rfl: 1 .  ELIQUIS 5 MG TABS tablet, TAKE 1 TABLET TWICE DAILY, Disp: 180 tablet, Rfl: 1 .  furosemide (LASIX) 40 MG tablet, Take 40 mg by mouth daily as needed. , Disp: , Rfl:  .  lisinopril (ZESTRIL) 2.5 MG tablet, Take 1 tablet (2.5 mg total) by mouth daily., Disp: 90 tablet, Rfl: 3 .  loratadine (CLARITIN) 10 MG tablet, Take 10 mg by mouth daily., Disp: , Rfl:  .  Multiple Vitamins-Minerals (CENTRUM SILVER ADULT 50+) TABS, Take 1 tablet by mouth daily., Disp: , Rfl:  .  Potassium 95 MG TABS, Take 1 tablet by mouth., Disp: , Rfl:  .  pravastatin (PRAVACHOL) 80 MG tablet, TAKE 1 TABLET  EVERY EVENING, Disp: 90 tablet, Rfl: 2 .  traMADol (ULTRAM) 50 MG tablet, One tablet every 6 hours as needed for pain., Disp: 90 tablet, Rfl: 0 .  verapamil (CALAN-SR) 120 MG CR tablet, TAKE 1 TABLET EVERY DAY, Disp: 90 tablet, Rfl: 2 .  metoprolol succinate (TOPROL-XL) 100 MG 24 hr tablet, Take 2 tablets every morning and 1 tablet every evening qd., Disp: 270 tablet, Rfl: 3  EXAM: This was a telehealth telephone visit and thus no physical exam was completed.  ASSESSMENT AND PLAN:  Discussed the following assessment and plan:  Atrial fibrillation ([place]) Rate controlled.  He will continue his current medications.  He will continue to see cardiology.  Essential hypertension Continue current medications.  Lab work is up-to-date.  Osteoarthritis of both knees Improved slightly.  No continue tramadol.  He will monitor for drowsiness.  He will continue to see orthopedics.  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  Ferritin has been trending up slightly.   No orders of the defined types were placed in this encounter.   No orders of the defined types were placed in this encounter.    I discussed the assessment and treatment plan with the patient. The patient was provided an opportunity to ask questions and all were answered. The patient agreed with the plan and demonstrated an understanding of the instructions.   The patient was advised to call back or seek an in-person evaluation if the symptoms worsen or if the condition fails to improve as anticipated.  I provided 13 minutes of non-face-to-face time during this encounter.   [name], MD

## 2019-06-13 NOTE — Assessment & Plan Note (Signed)
Improved slightly.  No continue tramadol.  He will monitor for drowsiness.  He will continue to see orthopedics.

## 2019-06-13 NOTE — Assessment & Plan Note (Signed)
He will continue to see hematology.  Ferritin has been trending up slightly.

## 2019-06-13 NOTE — Assessment & Plan Note (Addendum)
Continue current medications.  Lab work is up-to-date.

## 2019-06-13 NOTE — Assessment & Plan Note (Signed)
Rate controlled.  He will continue his current medications.  He will continue to see cardiology.

## 2019-07-16 ENCOUNTER — Other Ambulatory Visit: Payer: Self-pay | Admitting: Family Medicine

## 2019-07-20 NOTE — Progress Notes (Addendum)
[place]  [phone] [phone]  ID: Carlos Crosby OB: Dec 02, 1955  MR#: [id number]  [id number]  Patient Care Team: Leone Haven, MD as PCP - General (Family Medicine)  I connected with Carlos Crosby on 07/25/19 at 10:00 AM EST by video enabled telemedicine visit and verified that I am speaking with the correct person using two identifiers.   I discussed the limitations, risks, security and privacy concerns of performing an evaluation and management service by telemedicine and the availability of in-person appointments. I also discussed with the patient that there may be a patient responsible charge related to this service. The patient expressed understanding and agreed to proceed.   Other persons participating in the visit and their role in the encounter: Patient, MD.  Patient's location: Home. Provider's location: Clinic.   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  INTERVAL HISTORY: Patient agreed to evaluation and discussion of his laboratory work via video enabled telemedicine visit.  He continues to feel well and remains asymptomatic. He has no neurologic complaints.  He denies any recent fevers or illnesses.  He denies any chest pain, shortness of breath, cough, or hemoptysis.  He denies any nausea, vomiting, constipation, or diarrhea.  He has no urinary complaints.  Patient feels at his baseline offers no specific complaints today.  REVIEW OF SYSTEMS:   Review of Systems  Constitutional: Negative.  Negative for fever, malaise/fatigue and weight loss.  Respiratory: Negative.  Negative for cough, hemoptysis and shortness of breath.   Cardiovascular: Negative.  Negative for chest pain and leg swelling.  Gastrointestinal: Negative.  Negative for abdominal pain, blood in stool and melena.  Genitourinary: Negative.  Negative for hematuria.  Musculoskeletal: Negative.  Negative for back  pain.  Skin: Negative.  Negative for rash.  Neurological: Negative.  Negative for sensory change, focal weakness, weakness and headaches.  Psychiatric/Behavioral: Negative.  The patient is not nervous/anxious.     As per HPI. Otherwise, a complete review of systems is negative.  PAST MEDICAL HISTORY: Past Medical History:  Diagnosis Date  . Chronic a-fib ([place])   . Chronic systolic CHF (congestive heart failure) ([place])   . Depression   . Hemochromatosis   . History of kidney stones   . History of pulmonary embolism   . Hyperlipidemia   . Hypertension   . Migraine   . Obesity   . OSA (obstructive sleep apnea)    on CPAP  . Osteoarthritis    bilateral knees  . Rocky Mountain spotted fever 2011    PAST SURGICAL HISTORY: Past Surgical History:  Procedure Laterality Date  . CARDIAC CATHETERIZATION  2012   [facility]  . KNEE SURGERY     right knee   . LITHOTRIPSY  07/31/2014  . URETERAL STENT PLACEMENT  2016  . URETEROSCOPY      FAMILY HISTORY: Family History  Problem Relation Age of Onset  . Thyroid disease Mother   . Cancer Father        lung  . Arthritis Father   . Hypertension Father   . Cancer Paternal Grandfather        Throat cancer    ADVANCED DIRECTIVES (Y/N):  N  HEALTH MAINTENANCE: Social History   Tobacco Use  . Smoking status: Never Smoker  . Smokeless tobacco: Never Used  Substance Use Topics  . Alcohol use: No    Comment: occasional; 3 times a year  . Drug use: No     Colonoscopy:  PAP:  Bone density:  Lipid panel:  Allergies  Allergen Reactions  . Allopurinol Hives and Swelling  . Other Hives  . Diltiazem Itching and Rash  . Penicillin G Nausea Only    And dirrhea  . Ondansetron Rash  . Ondansetron Hcl Rash  . Penicillins Other (See Comments) and Nausea Only    Pt unsure of rxn; showed up on allergy test Pt unsure of rxn; showed up on allergy test Pt unsure of rxn; showed up on allergy test And dirrhea     Current Outpatient  Medications  Medication Sig Dispense Refill  . acetaminophen (TYLENOL) 500 MG tablet Take 1,000 mg by mouth every 6 (six) hours as needed.    . Cholecalciferol (VITAMIN D) 2000 units CAPS     . digoxin (LANOXIN) 0.125 MG tablet TAKE 1 TABLET SIX DAYS A WEEK AS DIRECTED 78 tablet 1  . ELIQUIS 5 MG TABS tablet TAKE 1 TABLET TWICE DAILY 180 tablet 1  . furosemide (LASIX) 40 MG tablet Take 40 mg by mouth daily as needed.     Marland Kitchen lisinopril (ZESTRIL) 2.5 MG tablet Take 1 tablet (2.5 mg total) by mouth daily. 90 tablet 3  . loratadine (CLARITIN) 10 MG tablet Take 10 mg by mouth daily.    . metoprolol succinate (TOPROL-XL) 100 MG 24 hr tablet Take 2 tablets every morning and 1 tablet every evening qd. 270 tablet 3  . Multiple Vitamins-Minerals (CENTRUM SILVER ADULT 50+) TABS Take 1 tablet by mouth daily.    . Potassium 95 MG TABS Take 1 tablet by mouth.    . pravastatin (PRAVACHOL) 80 MG tablet TAKE 1 TABLET EVERY EVENING 90 tablet 2  . traMADol (ULTRAM) 50 MG tablet TAKE 1 TABLET BY MOUTH EVERY 6 HOURS AS NEEDED FOR PAIN 90 tablet 0  . verapamil (CALAN-SR) 120 MG CR tablet TAKE 1 TABLET EVERY DAY 90 tablet 2   No current facility-administered medications for this visit.    OBJECTIVE: There were no vitals filed for this visit.   There is no height or weight on file to calculate BMI.    ECOG FS:0 - Asymptomatic  General: Well-developed, well-nourished, no acute distress. HEENT: Normocephalic. Neuro: Alert, answering all questions appropriately. Cranial nerves grossly intact. Psych: Normal affect.  LAB RESULTS:  Lab Results  Component Value Date   NA 138 02/05/2019   K 4.5 02/05/2019   CL 102 02/05/2019   CO2 27 02/05/2019   GLUCOSE 86 02/05/2019   BUN 20 02/05/2019   CREATININE 0.85 02/05/2019   CALCIUM 9.4 02/05/2019   PROT 6.7 02/05/2019   ALBUMIN 4.3 02/05/2019   AST 18 02/05/2019   ALT 20 02/05/2019   ALKPHOS 41 02/05/2019   BILITOT 1.1 02/05/2019   GFRNONAA >60 03/30/2017    GFRAA >60 03/30/2017    Lab Results  Component Value Date   WBC 7.7 07/24/2019   NEUTROABS 4.4 07/24/2019   HGB 17.1 (H) 07/24/2019   HCT 50.9 07/24/2019   MCV 99.0 07/24/2019   PLT 199 07/24/2019   Lab Results  Component Value Date   IRON 184 (H) 07/24/2019   TIBC 351 07/24/2019   IRONPCTSAT 52 (H) 07/24/2019   Lab Results  Component Value Date   FERRITIN 168 07/24/2019     STUDIES: No results found.  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  Goal ferritin is 50-100.  Previously, the remainder of his laboratory work is either negative or within normal limits.  It appears patient will require phlebotomy approximately every 6 months.  Return to clinic next week for 500 mL phlebotomy.  Patient will then return to clinic in 6 months for repeat laboratory work, further evaluation, and continuation of treatment.  Of note, because patient is on Eliquis he cannot receive his phlebotomy through [facility].   2.  Weight loss: Intentional.  Patient states he does not require gastric bypass at this time. 3.  Polycythemia: Likely secondary to elevated iron stores.  Phlebotomy as above.  I provided 20 minutes of face-to-face video visit time during this encounter which included chart review, counseling, and coordination of care as documented above.\    Patient expressed understanding and was in agreement with this plan. He also understands that He can call clinic at any time with any questions, concerns, or complaints.    Lloyd Huger, MD   07/25/2019 11:34 AM

## 2019-07-23 ENCOUNTER — Other Ambulatory Visit: Payer: Self-pay

## 2019-07-24 ENCOUNTER — Inpatient Hospital Stay: Payer: Medicare HMO | Attending: Oncology

## 2019-07-24 ENCOUNTER — Other Ambulatory Visit: Payer: Self-pay

## 2019-07-24 DIAGNOSIS — I110 Hypertensive heart disease with heart failure: Secondary | ICD-10-CM | POA: Diagnosis not present

## 2019-07-24 DIAGNOSIS — Z801 Family history of malignant neoplasm of trachea, bronchus and lung: Secondary | ICD-10-CM | POA: Insufficient documentation

## 2019-07-24 DIAGNOSIS — G4733 Obstructive sleep apnea (adult) (pediatric): Secondary | ICD-10-CM | POA: Diagnosis not present

## 2019-07-24 DIAGNOSIS — Z8261 Family history of arthritis: Secondary | ICD-10-CM | POA: Insufficient documentation

## 2019-07-24 DIAGNOSIS — I5022 Chronic systolic (congestive) heart failure: Secondary | ICD-10-CM | POA: Insufficient documentation

## 2019-07-24 DIAGNOSIS — R634 Abnormal weight loss: Secondary | ICD-10-CM | POA: Diagnosis not present

## 2019-07-24 DIAGNOSIS — E785 Hyperlipidemia, unspecified: Secondary | ICD-10-CM | POA: Diagnosis not present

## 2019-07-24 DIAGNOSIS — Z7901 Long term (current) use of anticoagulants: Secondary | ICD-10-CM | POA: Insufficient documentation

## 2019-07-24 DIAGNOSIS — Z8249 Family history of ischemic heart disease and other diseases of the circulatory system: Secondary | ICD-10-CM | POA: Insufficient documentation

## 2019-07-24 DIAGNOSIS — Z86711 Personal history of pulmonary embolism: Secondary | ICD-10-CM | POA: Insufficient documentation

## 2019-07-24 DIAGNOSIS — Z8349 Family history of other endocrine, nutritional and metabolic diseases: Secondary | ICD-10-CM | POA: Insufficient documentation

## 2019-07-24 DIAGNOSIS — Z79899 Other long term (current) drug therapy: Secondary | ICD-10-CM | POA: Diagnosis not present

## 2019-07-24 LAB — CBC WITH DIFFERENTIAL/PLATELET
Abs Immature Granulocytes: 0.04 10*3/uL (ref 0.00–0.07)
Basophils Absolute: 0.1 10*3/uL (ref 0.0–0.1)
Basophils Relative: 1 %
Eosinophils Absolute: 0.2 10*3/uL (ref 0.0–0.5)
Eosinophils Relative: 2 %
HCT: 50.9 % (ref 39.0–52.0)
Hemoglobin: 17.1 g/dL — ABNORMAL HIGH (ref 13.0–17.0)
Immature Granulocytes: 1 %
Lymphocytes Relative: 33 %
Lymphs Abs: 2.5 10*3/uL (ref 0.7–4.0)
MCH: 33.3 pg (ref 26.0–34.0)
MCHC: 33.6 g/dL (ref 30.0–36.0)
MCV: 99 fL (ref 80.0–100.0)
Monocytes Absolute: 0.6 10*3/uL (ref 0.1–1.0)
Monocytes Relative: 8 %
Neutro Abs: 4.4 10*3/uL (ref 1.7–7.7)
Neutrophils Relative %: 55 %
Platelets: 199 10*3/uL (ref 150–400)
RBC: 5.14 MIL/uL (ref 4.22–5.81)
RDW: 12.5 % (ref 11.5–15.5)
WBC: 7.7 10*3/uL (ref 4.0–10.5)
nRBC: 0 % (ref 0.0–0.2)

## 2019-07-24 LAB — IRON AND TIBC
Iron: 184 ug/dL — ABNORMAL HIGH (ref 45–182)
Saturation Ratios: 52 % — ABNORMAL HIGH (ref 17.9–39.5)
TIBC: 351 ug/dL (ref 250–450)
UIBC: 167 ug/dL

## 2019-07-24 LAB — FERRITIN: Ferritin: 168 ng/mL (ref 24–336)

## 2019-07-25 ENCOUNTER — Inpatient Hospital Stay (HOSPITAL_BASED_OUTPATIENT_CLINIC_OR_DEPARTMENT_OTHER): Payer: Medicare HMO | Admitting: Oncology

## 2019-07-25 ENCOUNTER — Inpatient Hospital Stay: Payer: Medicare HMO

## 2019-07-25 ENCOUNTER — Other Ambulatory Visit: Payer: Self-pay

## 2019-07-26 ENCOUNTER — Other Ambulatory Visit: Payer: Self-pay

## 2019-07-26 ENCOUNTER — Ambulatory Visit: Payer: Medicare HMO

## 2019-07-26 ENCOUNTER — Ambulatory Visit: Payer: Self-pay | Admitting: Family Medicine

## 2019-07-26 ENCOUNTER — Telehealth: Payer: Self-pay

## 2019-07-26 NOTE — Telephone Encounter (Signed)
Unable to reach patient for scheduled annual wellness visit. No answer, no voicemail. Please reschedule as appropriate.

## 2019-07-27 ENCOUNTER — Encounter: Payer: Self-pay | Admitting: Family Medicine

## 2019-07-30 ENCOUNTER — Other Ambulatory Visit: Payer: Self-pay | Admitting: Internal Medicine

## 2019-07-30 ENCOUNTER — Encounter: Payer: Self-pay | Admitting: Family Medicine

## 2019-08-01 ENCOUNTER — Other Ambulatory Visit: Payer: Self-pay

## 2019-08-01 ENCOUNTER — Inpatient Hospital Stay: Payer: Medicare HMO

## 2019-08-01 DIAGNOSIS — R634 Abnormal weight loss: Secondary | ICD-10-CM | POA: Diagnosis not present

## 2019-08-01 DIAGNOSIS — I110 Hypertensive heart disease with heart failure: Secondary | ICD-10-CM | POA: Diagnosis not present

## 2019-08-01 DIAGNOSIS — Z7901 Long term (current) use of anticoagulants: Secondary | ICD-10-CM | POA: Diagnosis not present

## 2019-08-01 DIAGNOSIS — Z86711 Personal history of pulmonary embolism: Secondary | ICD-10-CM | POA: Diagnosis not present

## 2019-08-01 DIAGNOSIS — I5022 Chronic systolic (congestive) heart failure: Secondary | ICD-10-CM | POA: Diagnosis not present

## 2019-08-01 DIAGNOSIS — Z79899 Other long term (current) drug therapy: Secondary | ICD-10-CM | POA: Diagnosis not present

## 2019-08-01 DIAGNOSIS — E785 Hyperlipidemia, unspecified: Secondary | ICD-10-CM | POA: Diagnosis not present

## 2019-08-01 DIAGNOSIS — G4733 Obstructive sleep apnea (adult) (pediatric): Secondary | ICD-10-CM | POA: Diagnosis not present

## 2019-08-02 NOTE — Telephone Encounter (Signed)
Rescheduled 08/06/19

## 2019-08-02 NOTE — Telephone Encounter (Signed)
Called pt and rescheduled to 08/06/19

## 2019-08-06 ENCOUNTER — Ambulatory Visit (INDEPENDENT_AMBULATORY_CARE_PROVIDER_SITE_OTHER): Payer: Medicare HMO

## 2019-08-06 ENCOUNTER — Other Ambulatory Visit: Payer: Self-pay

## 2019-08-06 VITALS — BP 117/80 | HR 79 | Ht 71.0 in | Wt 280.0 lb

## 2019-08-06 DIAGNOSIS — Z0000 Encounter for general adult medical examination without abnormal findings: Secondary | ICD-10-CM

## 2019-08-06 NOTE — Progress Notes (Addendum)
Subjective:   Carlos Crosby is a 64 y.o. male who presents for Medicare Annual/Subsequent preventive examination.  Review of Systems:  No ROS.  Medicare Wellness Virtual Visit.  Vitals provided by patient. See social history for additional risk factors.   Cardiac Risk Factors include: advanced age (>55men, >102 women);male gender;hypertension     Objective:    Vitals: BP 117/80 (BP Location: Right Wrist, Patient Position: Sitting)   Pulse 79   Ht 5\' 11"  (1.803 m)   Wt 280 lb (127 kg)   SpO2 99%   BMI 39.05 kg/m   Body mass index is 39.05 kg/m.  Advanced Directives 08/06/2019 03/21/2019 11/16/2018 08/09/2018 07/24/2018 04/03/2018 10/25/2017  Does Patient Have a Medical Advance Directive? No No No No No No No  Would patient like information on creating a medical advance directive? No - Patient declined - - No - Patient declined No - Patient declined No - Patient declined No - Patient declined    Tobacco Social History   Tobacco Use  Smoking Status Never Smoker  Smokeless Tobacco Never Used     Counseling given: Not Answered   Clinical Intake:  Pre-visit preparation completed: Yes        Diabetes: No  How often do you need to have someone help you when you read instructions, pamphlets, or other written materials from your doctor or pharmacy?: 1 - Never  Interpreter Needed?: No     Past Medical History:  Diagnosis Date  . Chronic a-fib ([place])   . Chronic systolic CHF (congestive heart failure) ([place])   . Depression   . Hemochromatosis   . History of kidney stones   . History of pulmonary embolism   . Hyperlipidemia   . Hypertension   . Migraine   . Obesity   . OSA (obstructive sleep apnea)    on CPAP  . Osteoarthritis    bilateral knees  . Rocky Mountain spotted fever 2011   Past Surgical History:  Procedure Laterality Date  . CARDIAC CATHETERIZATION  2012   [facility]  . KNEE SURGERY     right knee   . LITHOTRIPSY  07/31/2014  . URETERAL STENT PLACEMENT   2016  . URETEROSCOPY     Family History  Problem Relation Age of Onset  . Thyroid disease Mother   . Cancer Father        lung  . Arthritis Father   . Hypertension Father   . Cancer Paternal Grandfather        Throat cancer   Social History   Socioeconomic History  . Marital status: Married    Spouse name: Not on file  . Number of children: Not on file  . Years of education: Not on file  . Highest education level: Not on file  Occupational History  . Not on file  Tobacco Use  . Smoking status: Never Smoker  . Smokeless tobacco: Never Used  Substance and Sexual Activity  . Alcohol use: No    Comment: occasional; 3 times a year  . Drug use: No  . Sexual activity: Not Currently  Other Topics Concern  . Not on file  Social History Narrative  . Not on file   Social Determinants of Health   Financial Resource Strain:   . Difficulty of Paying Living Expenses: Not on file  Food Insecurity:   . Worried About Charity fundraiser in the Last Year: Not on file  . Ran Out of Food in the Last Year:  Not on file  Transportation Needs:   . Lack of Transportation (Medical): Not on file  . Lack of Transportation (Non-Medical): Not on file  Physical Activity:   . Days of Exercise per Week: Not on file  . Minutes of Exercise per Session: Not on file  Stress:   . Feeling of Stress : Not on file  Social Connections:   . Frequency of Communication with Friends and Family: Not on file  . Frequency of Social Gatherings with Friends and Family: Not on file  . Attends Religious Services: Not on file  . Active Member of Clubs or Organizations: Not on file  . Attends Archivist Meetings: Not on file  . Marital Status: Not on file    Outpatient Encounter Medications as of 08/06/2019  Medication Sig  . acetaminophen (TYLENOL) 500 MG tablet Take 1,000 mg by mouth every 6 (six) hours as needed.  . Cholecalciferol (VITAMIN D) 2000 units CAPS   . digoxin (LANOXIN) 0.125 MG tablet  TAKE 1 TABLET SIX DAYS A WEEK AS DIRECTED  . ELIQUIS 5 MG TABS tablet TAKE 1 TABLET TWICE DAILY  . furosemide (LASIX) 40 MG tablet Take 40 mg by mouth daily as needed.   Marland Kitchen lisinopril (ZESTRIL) 2.5 MG tablet Take 1 tablet (2.5 mg total) by mouth daily.  Marland Kitchen loratadine (CLARITIN) 10 MG tablet Take 10 mg by mouth daily.  . metoprolol succinate (TOPROL-XL) 100 MG 24 hr tablet Take 2 tablets every morning and 1 tablet every evening qd.  . Multiple Vitamins-Minerals (CENTRUM SILVER ADULT 50+) TABS Take 1 tablet by mouth daily.  . Potassium 95 MG TABS Take 1 tablet by mouth.  . pravastatin (PRAVACHOL) 80 MG tablet TAKE 1 TABLET EVERY EVENING  . traMADol (ULTRAM) 50 MG tablet TAKE 1 TABLET BY MOUTH EVERY 6 HOURS AS NEEDED FOR PAIN  . verapamil (CALAN-SR) 120 MG CR tablet TAKE 1 TABLET EVERY DAY   No facility-administered encounter medications on file as of 08/06/2019.    Activities of Daily Living In your present state of health, do you have any difficulty performing the following activities: 08/06/2019  Hearing? N  Vision? N  Difficulty concentrating or making decisions? N  Walking or climbing stairs? Y  Comment Chronic knee pain; gel injection every 6 months, cortisone injection every 3 months  Dressing or bathing? N  Doing errands, shopping? N  Preparing Food and eating ? N  Using the Toilet? N  In the past six months, have you accidently leaked urine? N  Do you have problems with loss of bowel control? N  Managing your Medications? N  Managing your Finances? N  Housekeeping or managing your Housekeeping? N  Some recent data might be hidden    Patient Care Team: Leone Haven, MD as PCP - General (Family Medicine)   Assessment:   This is a routine wellness examination for Carlos Crosby.  Nurse connected with patient 08/06/19 at  8:30 AM EST by a telephone enabled telemedicine application and verified that I am speaking with the correct person using two identifiers. Patient stated full name  and DOB. Patient gave permission to continue with virtual visit. Patient's location was at home and Nurse's location was at [place] office.   Patient is alert and oriented x3. Patient denies difficulty focusing or concentrating. Patient likes to debate with others to learn others prospective for brain stimulation.   Health Maintenance Due: -Tdap vaccine- discussed; deferred per patient request.  -HIV lab discussed- patient believes this was  completed some time ago. Discontinued per patient request.    See completed HM at the end of note.   Eye: Visual acuity not assessed. Virtual visit. Followed by their ophthalmologist.  Dental: Dentures- yes  Hearing: Demonstrates normal hearing during visit.  Safety:  Patient feels safe at home- yes Patient does have smoke detectors at home- yes Patient does wear sunscreen or protective clothing when in direct sunlight - yes Patient does wear seat belt when in a moving vehicle - yes Patient drives- yes Adequate lighting in walkways free from debris- yes Grab bars and handrails used as appropriate- yes Ambulates with an assistive device- no  Social: Alcohol intake - no  Smoking history- never   Smokers in home? none Illicit drug use? none  Medication: Taking as directed and without issues.  Pill box in use -yes  Self managed - yes   Covid-19: Precautions and sickness symptoms discussed. Wears mask, social distancing, hand hygiene as appropriate.   Activities of Daily Living Patient denies needing assistance with: household chores, feeding themselves, getting from bed to chair, getting to the toilet, bathing/showering, dressing, managing money, or preparing meals.   Discussed the importance of a healthy diet, water intake and the benefits of aerobic exercise.   Physical activity- swimming 3 times weekly, 60 minutes  Diet:  Low carb; 2000 calorie  Water: 110 ounces daily Caffeine: none  Other Providers Patient Care Team:  Leone Haven, MD as PCP - General (Family Medicine) Exercise Activities and Dietary recommendations    Goals      Patient Stated   . Weight (lb) < 220 lb (99.8 kg) (pt-stated)       Fall Risk Fall Risk  08/06/2019 06/10/2019 02/05/2019 07/24/2018 05/08/2018  Falls in the past year? 0 0 0 0 0  Number falls in past yr: - 0 0 - -  Risk for fall due to : - - - - -  Follow up Falls evaluation completed Falls evaluation completed Falls evaluation completed - -   Timed Get Up and Go Performed: no, virtual visit  Depression Screen PHQ 2/9 Scores 08/06/2019 06/10/2019 02/05/2019 07/24/2018  PHQ - 2 Score 0 0 0 0    Cognitive Function MMSE - Mini Mental State Exam 07/24/2018 07/21/2017 07/20/2016  Orientation to time 5 5 5   Orientation to Place 5 5 5   Registration 3 3 3   Attention/ Calculation 5 5 5   Recall 3 3 2   Recall-comments - - 2 out of 3 words recalled  Language- name 2 objects 2 2 2   Language- repeat 1 1 1   Language- follow 3 step command 3 3 3   Language- read & follow direction 1 1 1   Write a sentence 1 1 1   Copy design 1 1 1   Total score 30 30 29         Immunization History  Administered Date(s) Administered  . Influenza,inj,Quad PF,6+ Mos 03/20/2013, 02/27/2015, 01/26/2017, 02/19/2018, 02/05/2019  . Influenza-Unspecified 03/08/2014, 02/26/2016, 02/17/2018, 02/19/2018  . Moderna SARS-COVID-2 Vaccination 08/03/2019  . Pneumococcal Polysaccharide-23 05/06/2015   Screening Tests Health Maintenance  Topic Date Due  . TETANUS/TDAP  08/05/2020 (Originally 01/19/1975)  . Fecal DNA (Cologuard)  06/22/2020  . INFLUENZA VACCINE  Completed  . Hepatitis C Screening  Completed  . HIV Screening  Discontinued       Plan:   Keep all routine maintenance appointments.   Follow up 10/08/19 @ 8:00  Medicare Attestation I have personally reviewed: The patient's medical and social history Their use  of alcohol, tobacco or illicit drugs Their current medications and supplements The  patient's functional ability including ADLs,fall risks, home safety risks, cognitive, and hearing and visual impairment Diet and physical activities Evidence for depression   I have reviewed and discussed with patient certain preventive protocols, quality metrics, and best practice recommendations.   Varney Biles, LPN  [id number]

## 2019-08-06 NOTE — Patient Instructions (Addendum)
  [name] , Thank you for taking time to come for your Medicare Wellness Visit. I appreciate your ongoing commitment to your health goals. Please review the following plan we discussed and let me know if I can assist you in the future.   These are the goals we discussed: Goals      Patient Stated   . Weight (lb) < 220 lb (99.8 kg) (pt-stated)       This is a list of the screening recommended for you and due dates:  Health Maintenance  Topic Date Due  . Tetanus Vaccine  08/05/2020*  . Cologuard (Stool DNA test)  06/22/2020  . Flu Shot  Completed  .  Hepatitis C: One time screening is recommended by Center for Disease Control  (CDC) for  adults born from [birth year] through 1964.   Completed  . HIV Screening  Discontinued  *Topic was postponed. The date shown is not the original due date.

## 2019-08-14 DIAGNOSIS — Z0100 Encounter for examination of eyes and vision without abnormal findings: Secondary | ICD-10-CM | POA: Diagnosis not present

## 2019-08-16 DIAGNOSIS — G4733 Obstructive sleep apnea (adult) (pediatric): Secondary | ICD-10-CM | POA: Diagnosis not present

## 2019-08-16 DIAGNOSIS — G4730 Sleep apnea, unspecified: Secondary | ICD-10-CM | POA: Diagnosis not present

## 2019-08-22 ENCOUNTER — Other Ambulatory Visit: Payer: Self-pay | Admitting: Cardiovascular Disease

## 2019-08-22 DIAGNOSIS — I4891 Unspecified atrial fibrillation: Secondary | ICD-10-CM

## 2019-08-22 NOTE — Telephone Encounter (Signed)
Refill request

## 2019-08-22 NOTE — Telephone Encounter (Signed)
Eliquis 5mg  refill request received. Pt is 64 years old, weight-127kg, Crea-0.85 on 02/05/2019, Diagnosis-Afib, and last seen by Dr. Rockey Situ on 02/27/2019. Dose is appropriate based on dosing criteria. Will send in refill to requested pharmacy.

## 2019-08-27 ENCOUNTER — Other Ambulatory Visit: Payer: Self-pay | Admitting: Family Medicine

## 2019-08-29 NOTE — Telephone Encounter (Signed)
Last refill: 2.9.21 #90, 0 Last OV: 1.4.21 dx. Atrial fibrillation f/u

## 2019-08-30 IMAGING — DX DG ABDOMEN 1V
3 series · 3 of 3 positions shown · non-contrast
Comparison: KUB October 02, 2014

CLINICAL DATA: Assess kidney stone burden. Pre operative evaluation
prior to weight loss surgery.

EXAM:
ABDOMEN - 1 VIEW

[abdomen kub (1 of 3)]
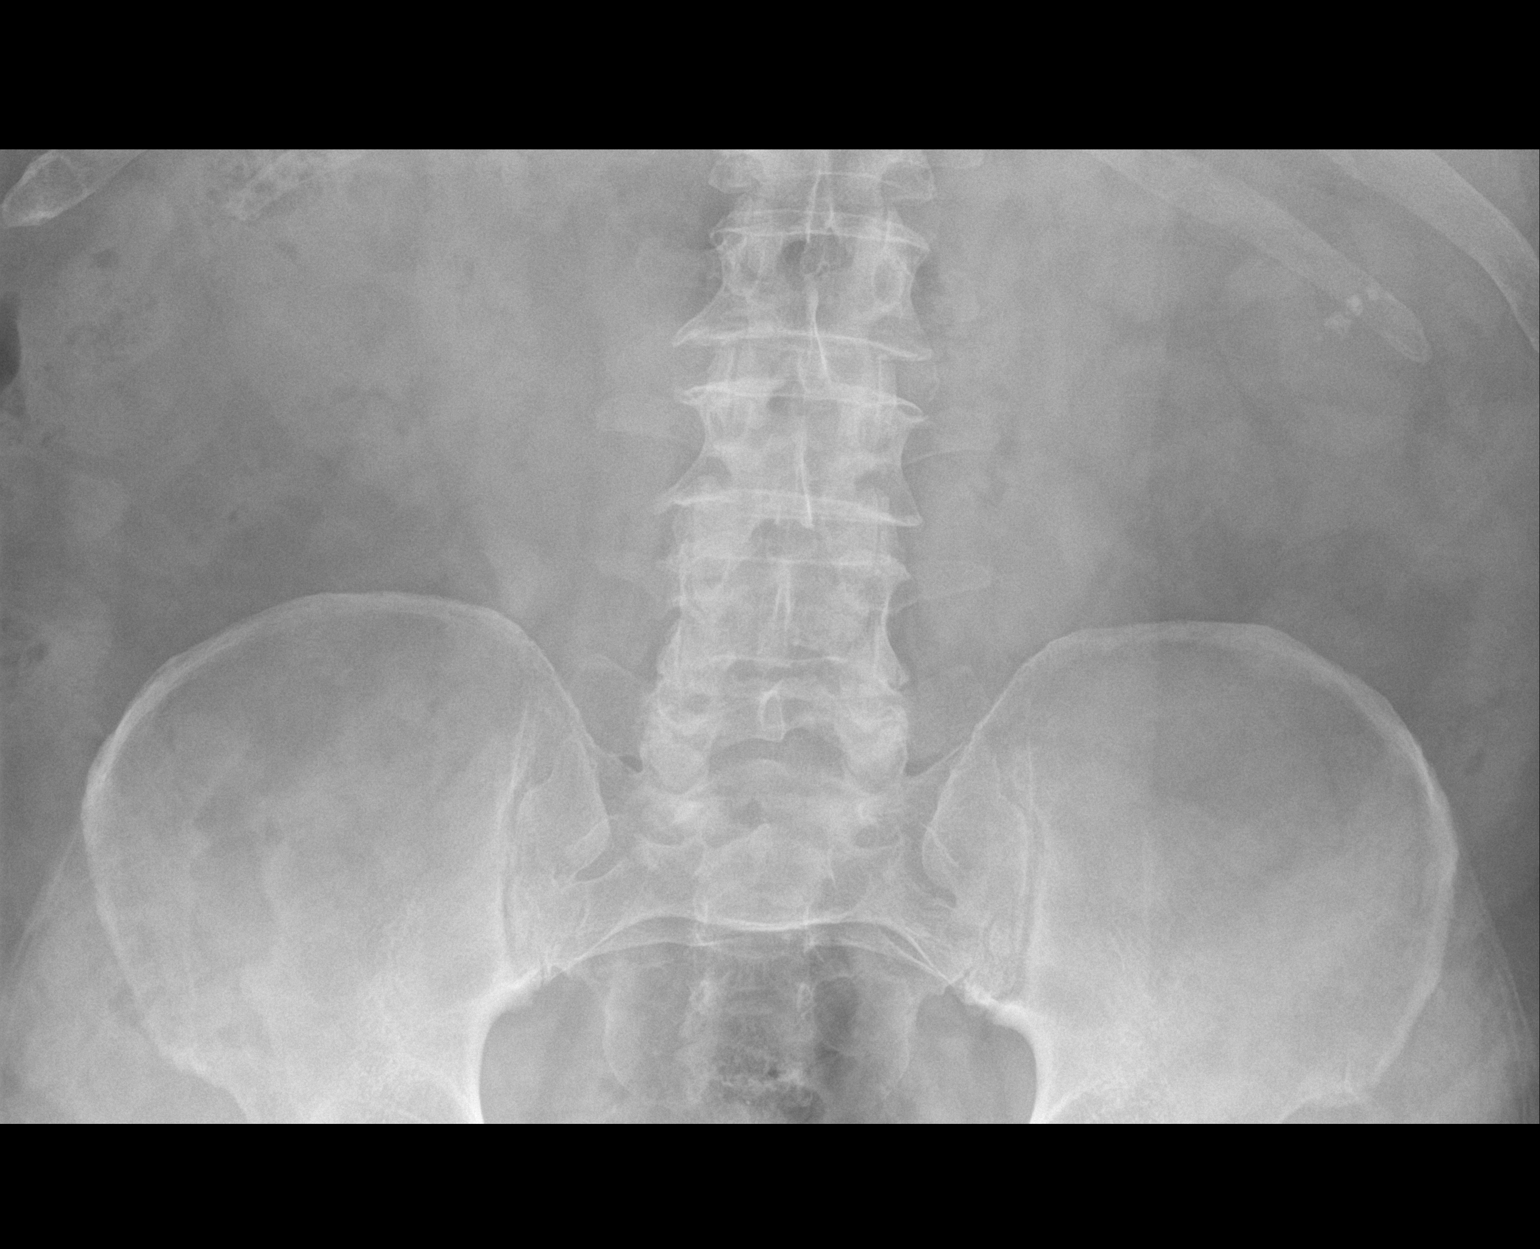

[abdomen kub (2 of 3)]
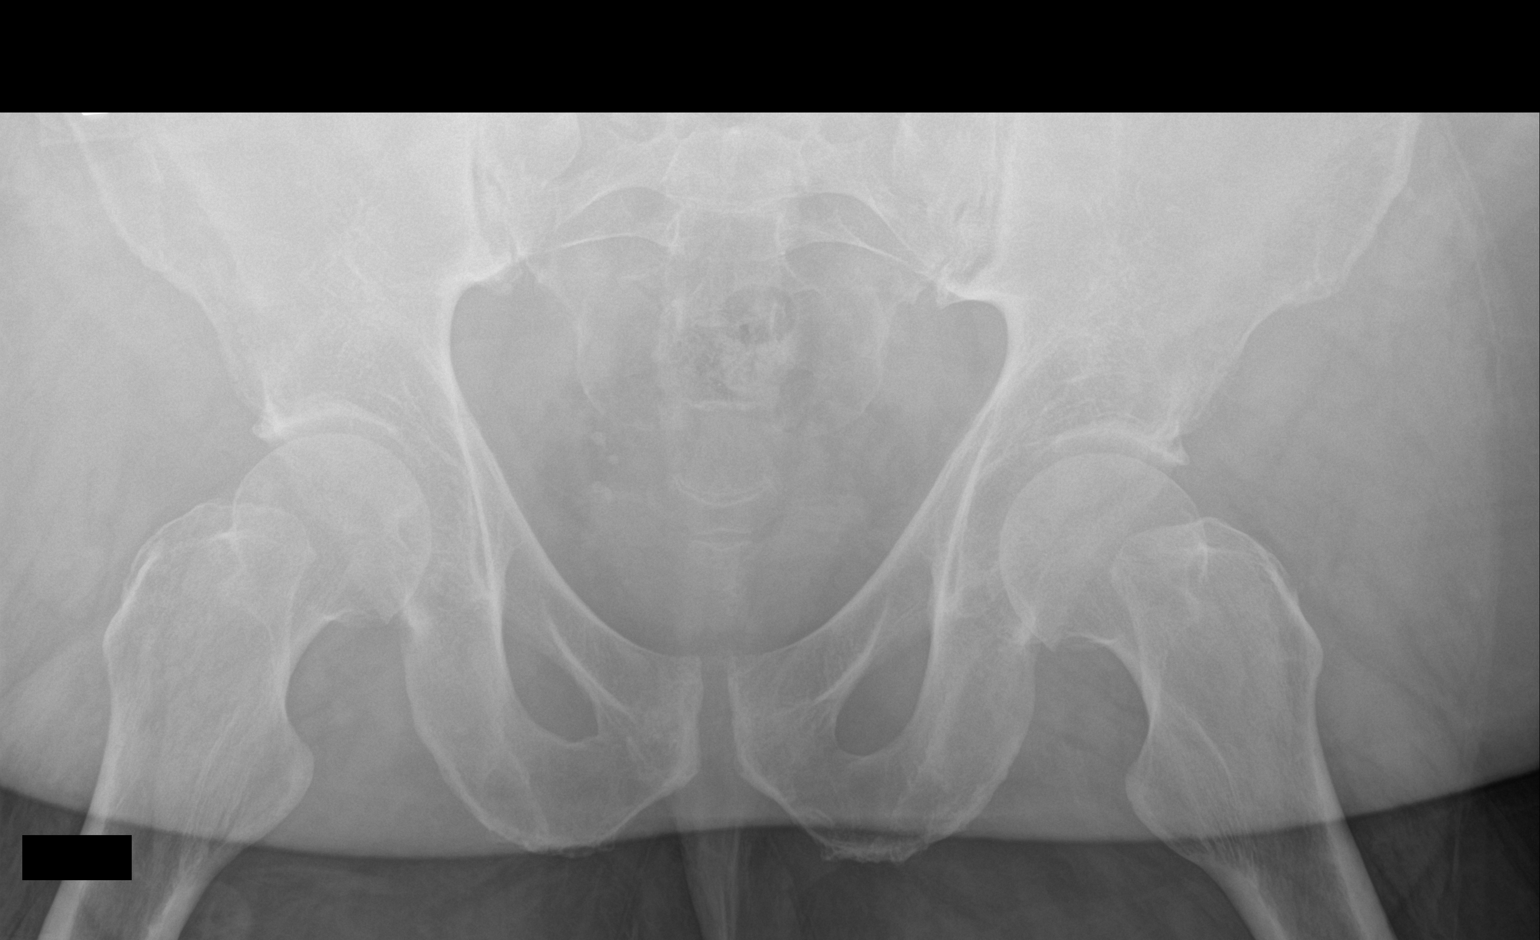

[abdomen kub (3 of 3)]
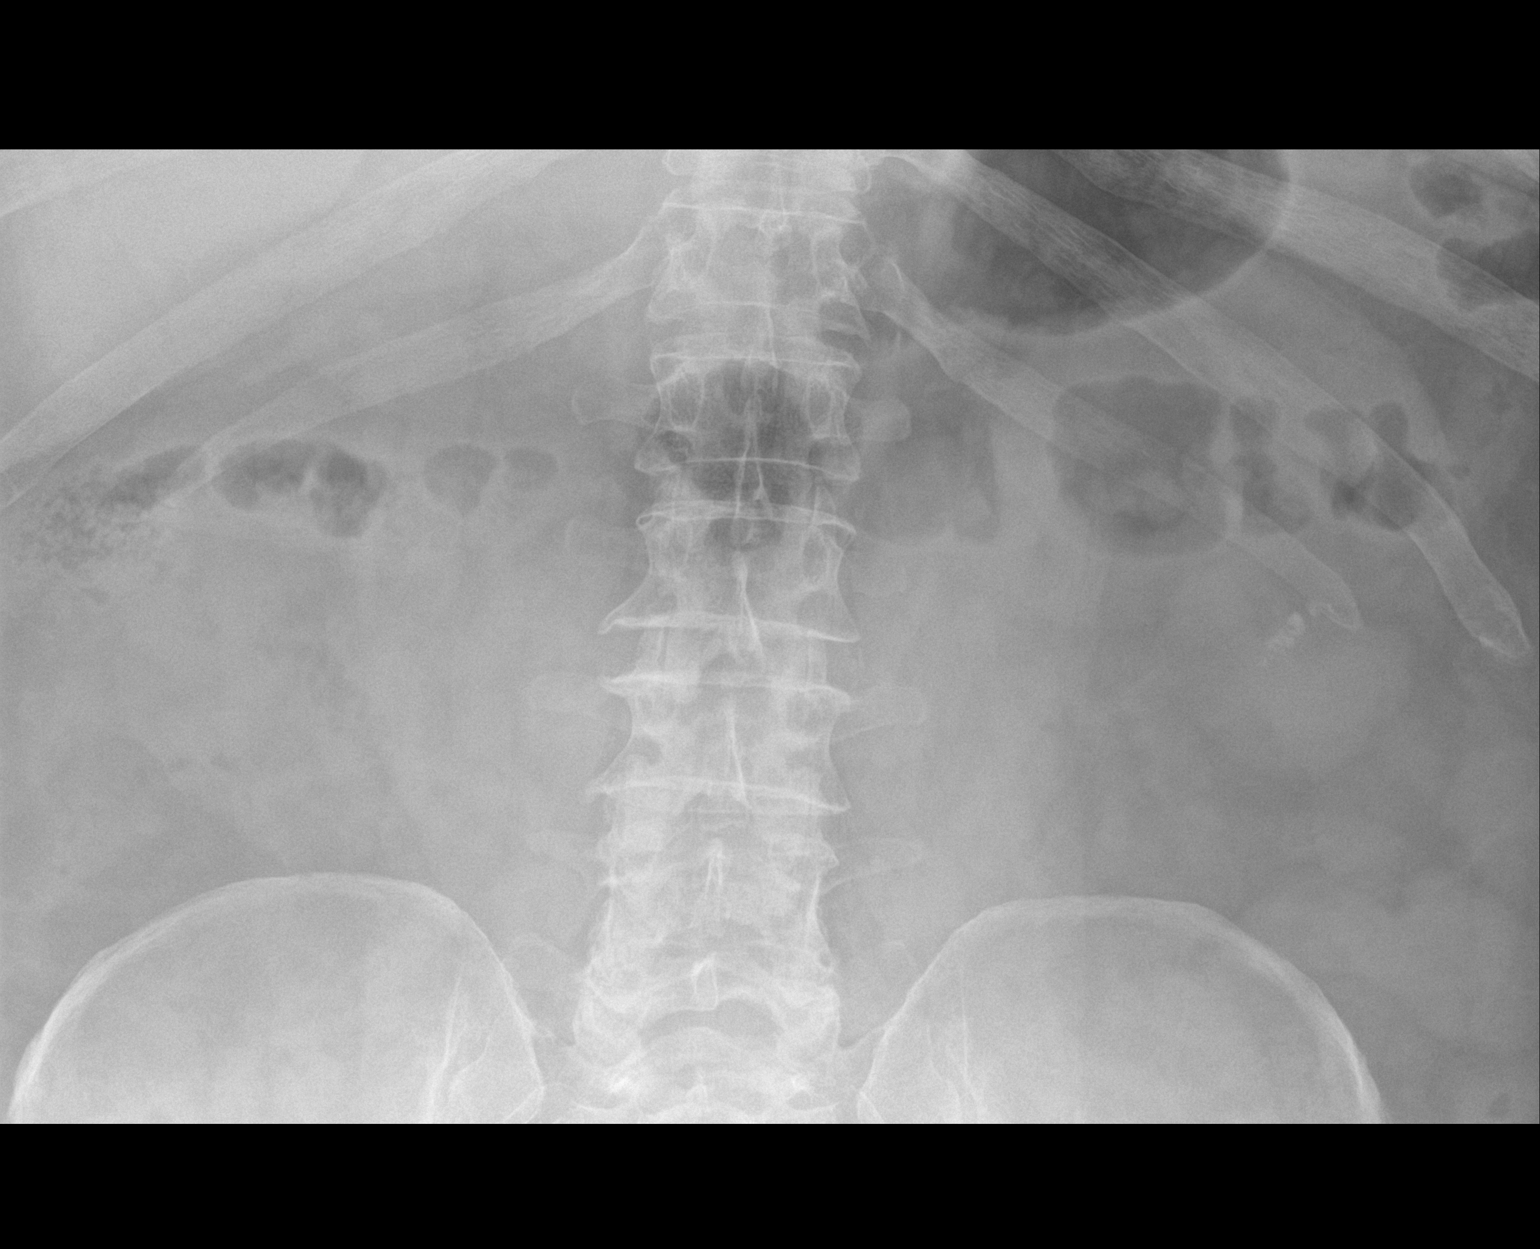

[3 of 3 positions shown; findings below may reference images not displayed]

FINDINGS: There remain coarse calcifications projecting over the lower pole of
the left kidney. The largest of these measures just under 8 mm in
diameter. No definite right-sided kidney stones are observed. No
ureteral stones are demonstrated. There are phleboliths within the
pelvis. There are degenerative changes of the lumbar spine. The
bowel gas pattern is unremarkable.
IMPRESSION: Multiple lower pole kidney stones on the left. No ureteral or
bladder stones are observed.

## 2019-09-05 ENCOUNTER — Encounter: Payer: Self-pay | Admitting: Internal Medicine

## 2019-09-05 ENCOUNTER — Other Ambulatory Visit: Payer: Self-pay

## 2019-09-05 ENCOUNTER — Ambulatory Visit (INDEPENDENT_AMBULATORY_CARE_PROVIDER_SITE_OTHER): Payer: Medicare HMO | Admitting: Internal Medicine

## 2019-09-05 VITALS — BP 122/88 | HR 85 | Ht 71.5 in | Wt 300.0 lb

## 2019-09-05 DIAGNOSIS — Z79899 Other long term (current) drug therapy: Secondary | ICD-10-CM | POA: Diagnosis not present

## 2019-09-05 DIAGNOSIS — I4819 Other persistent atrial fibrillation: Secondary | ICD-10-CM | POA: Diagnosis not present

## 2019-09-05 DIAGNOSIS — I429 Cardiomyopathy, unspecified: Secondary | ICD-10-CM

## 2019-09-05 NOTE — Patient Instructions (Addendum)
Medication Instructions:  - Your physician recommends that you continue on your current medications as directed. Please refer to the Current Medication list given to you today.  *If you need a refill on your cardiac medications before your next appointment, please call your pharmacy*   Lab Work: - Your physician recommends that you have lab work today: BMP/ Digoxin  If you have labs (blood work) drawn today and your tests are completely normal, you will receive your results only by: Marland Kitchen MyChart Message (if you have MyChart) OR . A paper copy in the mail If you have any lab test that is abnormal or we need to change your treatment, we will call you to review the results.   Testing/Procedures: - none ordered   Follow-Up: At [facility], you and your health needs are our priority.  As part of our continuing mission to provide you with exceptional heart care, we have created designated Provider Care Teams.  These Care Teams include your primary Cardiologist (physician) and Advanced Practice Providers (APPs -  Physician Assistants and Nurse Practitioners) who all work together to provide you with the care you need, when you need it.  We recommend signing up for the patient portal called "MyChart".  Sign up information is provided on this After Visit Summary.  MyChart is used to connect with patients for Virtual Visits (Telemedicine).  Patients are able to view lab/test results, encounter notes, upcoming appointments, etc.  Non-urgent messages can be sent to your provider as well.   To learn more about what you can do with MyChart, go to [email or web address].    Your next appointment:   1 year(s)  The format for your next appointment:   In Person  Provider:   Virl Axe, MD   Other Instructions n/a

## 2019-09-05 NOTE — Progress Notes (Signed)
Patient Care Team: Leone Haven, MD as PCP - General (Family Medicine) Rockey Situ Kathlene November, MD as Consulting Physician (Cardiology)   HPI  Carlos Crosby is a 64 y.o. male Seen in follow-up because of persistent atrial fibrillation dating back to 2014. He declined catheter ablation at [facility] at that time. A strategy of rate control had been pursued;  Initially unsuccessful but persistence>> and EF at last See Below was nearly normal   The patient denies chest pain, nocturnal dyspnea, orthopnea or peripheral edema.  There have been no palpitations, lightheadedness or syncope  Chronic sob   Has new dog, macey, walks him 2-3 d/week.    On Anticoagulation;  No bleeding issues   AFib HR by Kardia 80's almost always at rest   DATE TEST EF   12/16    Echo   30-35 %   6/17    Echo  45-50 %   7/17 Myoview  42% No ischemia  10/18 Echo   40-45%   5/19 Echo  50-55%     DATE TEST Mean HR   2/16    Holter 93 (52-185)      6/17    Holter 75(38-132)         Date Cr K Hgb Dig  1/17     1.1  8/18 0.99 4.6  0.5 (10/18)   1/19 0.90 4.6 16.7   12/19 0.87 4.4 15.8 1.6  9/20 0.85 4.5 17.1 0.6 (6/20)   He chose not to have gastric sleeve; but has lost 60 lbs with exercise      Past Medical History:  Diagnosis Date  . Chronic a-fib ([place])   . Chronic systolic CHF (congestive heart failure) ([place])   . Depression   . Hemochromatosis   . History of kidney stones   . History of pulmonary embolism   . Hyperlipidemia   . Hypertension   . Migraine   . Obesity   . OSA (obstructive sleep apnea)    on CPAP  . Osteoarthritis    bilateral knees  . Rocky Mountain spotted fever 2011    Past Surgical History:  Procedure Laterality Date  . CARDIAC CATHETERIZATION  2012   [facility]  . KNEE SURGERY     right knee   . LITHOTRIPSY  07/31/2014  . URETERAL STENT PLACEMENT  2016  . URETEROSCOPY      Current Outpatient Medications  Medication Sig Dispense Refill  . acetaminophen (TYLENOL)  500 MG tablet Take 1,000 mg by mouth every 6 (six) hours as needed.    . Cholecalciferol (VITAMIN D) 2000 units CAPS     . digoxin (LANOXIN) 0.125 MG tablet TAKE 1 TABLET SIX DAYS A WEEK AS DIRECTED 78 tablet 0  . ELIQUIS 5 MG TABS tablet TAKE 1 TABLET TWICE DAILY 180 tablet 1  . furosemide (LASIX) 40 MG tablet Take 40 mg by mouth daily as needed.     Marland Kitchen lisinopril (ZESTRIL) 2.5 MG tablet Take 1 tablet (2.5 mg total) by mouth daily. 90 tablet 3  . loratadine (CLARITIN) 10 MG tablet Take 10 mg by mouth daily.    . metoprolol succinate (TOPROL-XL) 100 MG 24 hr tablet Take 2 tablets every morning and 1 tablet every evening qd. 270 tablet 3  . Multiple Vitamins-Minerals (CENTRUM SILVER ADULT 50+) TABS Take 1 tablet by mouth daily.    . Potassium 95 MG TABS Take 1 tablet by mouth.    . pravastatin (PRAVACHOL) 80 MG tablet  TAKE 1 TABLET EVERY EVENING 90 tablet 2  . traMADol (ULTRAM) 50 MG tablet TAKE 1 TABLET BY MOUTH EVERY 6 HOURS AS NEEDED FOR PAIN 90 tablet 0  . verapamil (CALAN-SR) 120 MG CR tablet TAKE 1 TABLET EVERY DAY 90 tablet 2   No current facility-administered medications for this visit.    Allergies  Allergen Reactions  . Allopurinol Hives and Swelling  . Other Hives  . Diltiazem Itching and Rash  . Penicillin G Nausea Only    And dirrhea  . Ondansetron Rash  . Ondansetron Hcl Rash  . Penicillins Other (See Comments) and Nausea Only    Pt unsure of rxn; showed up on allergy test Pt unsure of rxn; showed up on allergy test Pt unsure of rxn; showed up on allergy test And dirrhea       Review of Systems negative except from HPI and PMH  Physical Exam BP 122/88 (BP Location: Left Arm, Patient Position: Sitting, Cuff Size: Large)   Pulse 85   Ht 5' 11.5" (1.816 m)   Wt 300 lb (136.1 kg)   SpO2 99%   BMI 41.26 kg/m  Well developed and  Morbidly obese  in no acute distress HENT normal Neck supple with JVP-  Clear Regular rate and rhythm, no murmurs or  gallops Abd-soft with active BS No Clubbing cyanosis edema Skin-warm and dry A & Oriented  Grossly normal sensory and motor function  ECG Afib 85 -10/35  Assessment and  Plan   Afib  now controlled ventricular response  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

## 2019-09-06 LAB — BASIC METABOLIC PANEL
BUN/Creatinine Ratio: 20 (ref 10–24)
BUN: 17 mg/dL (ref 8–27)
CO2: 22 mmol/L (ref 20–29)
Calcium: 9.7 mg/dL (ref 8.6–10.2)
Chloride: 102 mmol/L (ref 96–106)
Creatinine, Ser: 0.87 mg/dL (ref 0.76–1.27)
GFR calc Af Amer: 107 mL/min/{1.73_m2} (ref >59)
GFR calc non Af Amer: 92 mL/min/{1.73_m2} (ref >59)
Glucose: 99 mg/dL (ref 65–99)
Potassium: 4.6 mmol/L (ref 3.5–5.2)
Sodium: 138 mmol/L (ref 134–144)

## 2019-09-06 LAB — DIGOXIN LEVEL: Digoxin, Serum: 0.7 ng/mL (ref 0.5–0.9)

## 2019-10-02 ENCOUNTER — Encounter: Payer: Self-pay | Admitting: Orthopaedic Surgery

## 2019-10-08 ENCOUNTER — Encounter: Payer: Self-pay | Admitting: Family Medicine

## 2019-10-08 ENCOUNTER — Other Ambulatory Visit: Payer: Self-pay

## 2019-10-08 ENCOUNTER — Ambulatory Visit (INDEPENDENT_AMBULATORY_CARE_PROVIDER_SITE_OTHER): Payer: Medicare HMO | Admitting: Family Medicine

## 2019-10-08 DIAGNOSIS — I4891 Unspecified atrial fibrillation: Secondary | ICD-10-CM

## 2019-10-08 DIAGNOSIS — I10 Essential (primary) hypertension: Secondary | ICD-10-CM

## 2019-10-08 DIAGNOSIS — R21 Rash and other nonspecific skin eruption: Secondary | ICD-10-CM

## 2019-10-08 DIAGNOSIS — M170 Bilateral primary osteoarthritis of knee: Secondary | ICD-10-CM

## 2019-10-08 MED ORDER — TRAMADOL HCL 50 MG PO TABS: 50.0000 mg | ORAL_TABLET | Freq: Four times a day (QID) | ORAL | 0 refills | Status: DC | PRN

## 2019-10-08 NOTE — Assessment & Plan Note (Signed)
Stable.  He will continue to see orthopedics.  I will continue to prescribe his tramadol.  Refill given.

## 2019-10-08 NOTE — Patient Instructions (Signed)
Nice to see you. Please monitor your rash.  If it does not continue to improve please let us know. Please continue to work on diet and exercise. I have refilled your tramadol.

## 2019-10-08 NOTE — Assessment & Plan Note (Signed)
Adequately controlled.  Continue current medication regimen.  Recent BMP reviewed.

## 2019-10-08 NOTE — Assessment & Plan Note (Signed)
Encouraged continued dietary changes and exercise. 

## 2019-10-08 NOTE — Progress Notes (Signed)
Carlos Rumps, MD Phone: [phone]  Carlos Crosby is a 64 y.o. male who presents today for f/u.  A. fib/hypertension: Taking metoprolol, verapamil, Eliquis, digoxin, and lisinopril.  No palpitations, chest pain, shortness breath, or bleeding issues.  Does note some fatigue with the metoprolol.  Rash: Notes this is on his right upper back and wraps around underneath his armpit to his right lateral chest.  He notes he got into poison ivy about a week ago.  Has not spread.  It is progressively getting better.  It was erythematous papules previously.  Has been taking Benadryl and calamine.  No pain associated with this.  Morbid obesity: Patient notes he got off of his program though he is back on it.  Has been swimming and trying to be more active at home.  Walking more.  He has not had bread or potatoes in 2 years.  Eating more vegetables.  Chronic knee pain: He continues to follow with orthopedics.  He continues to get injections through them.  They have deferred any surgical interventions given issues with anesthesia and his A. fib.  Taking tramadol for pain.  No drowsiness.  Rare alcohol intake.  Patient additionally asks if it would be okay for him to take a marijuana edible when he goes to [place].  He wonders if it would affect his A. fib.  Social History   Tobacco Use  Smoking Status Never Smoker  Smokeless Tobacco Never Used     ROS see history of present illness  Objective  Physical Exam Vitals:   10/08/19 0809  BP: 119/80  Pulse: 71  Temp: (!) 95.5 F (35.3 C)  SpO2: 98%    BP Readings from Last 3 Encounters:  10/08/19 119/80  09/05/19 122/88  08/06/19 117/80   Wt Readings from Last 3 Encounters:  10/08/19 299 lb 9.6 oz (135.9 kg)  09/05/19 300 lb (136.1 kg)  08/06/19 280 lb (127 kg)    Physical Exam Constitutional:      General: He is not in acute distress.    Appearance: He is not diaphoretic.  Cardiovascular:     Rate and Rhythm: Normal rate and  regular rhythm.     Heart sounds: Normal heart sounds.  Pulmonary:     Effort: Pulmonary effort is normal.     Breath sounds: Normal breath sounds.  Musculoskeletal:     Right lower leg: No edema.     Left lower leg: No edema.  Skin:    General: Skin is warm and dry.  Neurological:     Mental Status: He is alert.    Right side back and flank    Assessment/Plan: Please see individual problem list.  Morbid obesity Encouraged continued dietary changes and exercise.  Atrial fibrillation ([place]) Rate controlled.  Continue his current medications through cardiology.  Monitor for any bleeding issues.  Essential hypertension Adequately controlled.  Continue current medication regimen.  Recent BMP reviewed.  Osteoarthritis of both knees Stable.  He will continue to see orthopedics.  I will continue to prescribe his tramadol.  Refill given.  Rash Likely poison ivy dermatitis though cannot rule out shingles.  It has improved significantly.  He will continue to monitor.  He can continue as needed Benadryl for this.   Health maintenance: Patient will get the Shingrix vaccine once his rash resolves.  Advised to get it at the pharmacy.  Also discussed that he could check with his insurance to see if they pay for it though I believe the  pharmacy should be able to tell him this.  No orders of the defined types were placed in this encounter.   Meds ordered this encounter  Medications  . traMADol (ULTRAM) 50 MG tablet    Sig: Take 1 tablet (50 mg total) by mouth every 6 (six) hours as needed. for pain    Dispense:  90 tablet    Refill:  0    This visit occurred during the SARS-CoV-2 public health emergency.  Safety protocols were in place, including screening questions prior to the visit, additional usage of staff PPE, and extensive cleaning of exam room while observing appropriate contact time as indicated for disinfecting solutions.    Carlos Rumps, MD [place]

## 2019-10-08 NOTE — Assessment & Plan Note (Signed)
Likely poison ivy dermatitis though cannot rule out shingles.  It has improved significantly.  He will continue to monitor.  He can continue as needed Benadryl for this.

## 2019-10-08 NOTE — Assessment & Plan Note (Signed)
Rate controlled.  Continue his current medications through cardiology.  Monitor for any bleeding issues.

## 2019-10-10 ENCOUNTER — Other Ambulatory Visit: Payer: Self-pay | Admitting: Family Medicine

## 2019-10-10 NOTE — Telephone Encounter (Signed)
This was sent in 2 days ago. Please contact the patient and let him know this should be at the pharmacy already.

## 2019-10-10 NOTE — Telephone Encounter (Signed)
Refill request for tramadol, last seen 10-08-19, last filled 10-08-19.  Please advise.

## 2019-10-17 ENCOUNTER — Encounter: Payer: Self-pay | Admitting: Family Medicine

## 2019-10-18 ENCOUNTER — Other Ambulatory Visit: Payer: Self-pay | Admitting: Internal Medicine

## 2019-10-18 ENCOUNTER — Telehealth (INDEPENDENT_AMBULATORY_CARE_PROVIDER_SITE_OTHER): Payer: Medicare HMO | Admitting: Family Medicine

## 2019-10-18 ENCOUNTER — Other Ambulatory Visit: Payer: Self-pay

## 2019-10-18 DIAGNOSIS — K6289 Other specified diseases of anus and rectum: Secondary | ICD-10-CM | POA: Insufficient documentation

## 2019-10-18 NOTE — Progress Notes (Signed)
Pre visit review using our clinic review tool, if applicable. No additional management support is needed unless otherwise documented below in the visit note. 

## 2019-10-18 NOTE — Assessment & Plan Note (Signed)
Seems most consistent with a rectal fissure though could represent a hemorrhoid.  Discussed continuing the hemorrhoid cream over-the-counter.  He will monitor through the weekend.  If he is continuing to have pain or bleeding early next week he will let us know we can have him see a Education officer, environmental.  Discussed that if he develops excessive bleeding or persistent bleeding that he cannot get stopped then he should seek medical attention in the emergency room.

## 2019-10-18 NOTE — Progress Notes (Signed)
Virtual Visit via video Note  This visit type was conducted due to national recommendations for restrictions regarding the COVID-19 pandemic (e.g. social distancing).  This format is felt to be most appropriate for this patient at this time.  All issues noted in this document were discussed and addressed.  No physical exam was performed (except for noted visual exam findings with Video Visits).   I connected with Carlos Crosby today at  8:00 AM EDT by a video enabled telemedicine application and verified that I am speaking with the correct person using two identifiers. Location patient: home Location provider: work  Persons participating in the virtual visit: patient, provider  I discussed the limitations, risks, security and privacy concerns of performing an evaluation and management service by telephone and the availability of in person appointments. I also discussed with the patient that there may be a patient responsible charge related to this service. The patient expressed understanding and agreed to proceed.  Reason for visit: same day visit.   HPI: Rectal pain/bleeding: Started 2 days ago.  He passed stool with pain that felt like passing a shard of glass.  He then had a small amount of bright red blood on the tissue paper.  He continued to have some pain last night though none today.  He has had small amounts of bright red blood on his tissue paper.  He tried a hemorrhoid cream over-the-counter and that seemed to help.  There are no lesions on his rectum.  He denies melena.  He does have a history of an anal fissure.  He has noted no blood in the toilet water.  Cologuard on 06/22/2017 was negative.  He has not been able to have colonoscopies in the past given his A. fib responds poorly to them.   ROS: See pertinent positives and negatives per HPI.  Past Medical History:  Diagnosis Date  . Chronic a-fib ([place])   . Chronic systolic CHF (congestive heart failure) ([place])   . Depression   .  Hemochromatosis   . History of kidney stones   . History of pulmonary embolism   . Hyperlipidemia   . Hypertension   . Migraine   . Obesity   . OSA (obstructive sleep apnea)    on CPAP  . Osteoarthritis    bilateral knees  . Rocky Mountain spotted fever 2011    Past Surgical History:  Procedure Laterality Date  . CARDIAC CATHETERIZATION  2012   [facility]  . KNEE SURGERY     right knee   . LITHOTRIPSY  07/31/2014  . URETERAL STENT PLACEMENT  2016  . URETEROSCOPY      Family History  Problem Relation Age of Onset  . Thyroid disease Mother   . Cancer Father        lung  . Arthritis Father   . Hypertension Father   . Cancer Paternal Grandfather        Throat cancer    SOCIAL HX: non-smoker   Current Outpatient Medications:  .  acetaminophen (TYLENOL) 500 MG tablet, Take 1,000 mg by mouth every 6 (six) hours as needed., Disp: , Rfl:  .  Cholecalciferol (VITAMIN D) 2000 units CAPS, , Disp: , Rfl:  .  digoxin (LANOXIN) 0.125 MG tablet, TAKE 1 TABLET SIX DAYS A WEEK AS DIRECTED, Disp: 78 tablet, Rfl: 0 .  ELIQUIS 5 MG TABS tablet, TAKE 1 TABLET TWICE DAILY, Disp: 180 tablet, Rfl: 1 .  furosemide (LASIX) 40 MG tablet, Take 40 mg by  mouth daily as needed. , Disp: , Rfl:  .  lisinopril (ZESTRIL) 2.5 MG tablet, Take 1 tablet (2.5 mg total) by mouth daily., Disp: 90 tablet, Rfl: 3 .  loratadine (CLARITIN) 10 MG tablet, Take 10 mg by mouth daily., Disp: , Rfl:  .  metoprolol succinate (TOPROL-XL) 100 MG 24 hr tablet, Take 2 tablets every morning and 1 tablet every evening qd., Disp: 270 tablet, Rfl: 3 .  Multiple Vitamins-Minerals (CENTRUM SILVER ADULT 50+) TABS, Take 1 tablet by mouth daily., Disp: , Rfl:  .  Potassium 95 MG TABS, Take 1 tablet by mouth., Disp: , Rfl:  .  pravastatin (PRAVACHOL) 80 MG tablet, TAKE 1 TABLET EVERY EVENING, Disp: 90 tablet, Rfl: 2 .  traMADol (ULTRAM) 50 MG tablet, Take 1 tablet (50 mg total) by mouth every 6 (six) hours as needed. for pain, Disp: 90  tablet, Rfl: 0 .  verapamil (CALAN-SR) 120 MG CR tablet, TAKE 1 TABLET EVERY DAY, Disp: 90 tablet, Rfl: 2  EXAM:  VITALS per patient if applicable:  GENERAL: alert, oriented, appears well and in no acute distress  HEENT: atraumatic, conjunttiva clear, no obvious abnormalities on inspection of external nose and ears  NECK: normal movements of the head and neck  LUNGS: on inspection no signs of respiratory distress, breathing rate appears normal, no obvious gross SOB, gasping or wheezing  CV: no obvious cyanosis  MS: moves all visible extremities without noticeable abnormality  PSYCH/NEURO: pleasant and cooperative, no obvious depression or anxiety, speech and thought processing grossly intact  ASSESSMENT AND PLAN:  Discussed the following assessment and plan:  Rectal pain Seems most consistent with a rectal fissure though could represent a hemorrhoid.  Discussed continuing the hemorrhoid cream over-the-counter.  He will monitor through the weekend.  If he is continuing to have pain or bleeding early next week he will let us know we can have him see a Education officer, environmental.  Discussed that if he develops excessive bleeding or persistent bleeding that he cannot get stopped then he should seek medical attention in the emergency room.   No orders of the defined types were placed in this encounter.   No orders of the defined types were placed in this encounter.    I discussed the assessment and treatment plan with the patient. The patient was provided an opportunity to ask questions and all were answered. The patient agreed with the plan and demonstrated an understanding of the instructions.   The patient was advised to call back or seek an in-person evaluation if the symptoms worsen or if the condition fails to improve as anticipated.   [name], MD

## 2019-11-14 ENCOUNTER — Telehealth: Payer: Self-pay

## 2019-11-14 NOTE — Telephone Encounter (Signed)
Submitted VOB for Orthovisc series, bilateral knee.

## 2019-11-20 ENCOUNTER — Other Ambulatory Visit: Payer: Self-pay | Admitting: Family Medicine

## 2019-11-21 ENCOUNTER — Telehealth: Payer: Self-pay

## 2019-11-21 DIAGNOSIS — G4730 Sleep apnea, unspecified: Secondary | ICD-10-CM | POA: Diagnosis not present

## 2019-11-21 DIAGNOSIS — G4733 Obstructive sleep apnea (adult) (pediatric): Secondary | ICD-10-CM | POA: Diagnosis not present

## 2019-11-21 NOTE — Telephone Encounter (Signed)
Patient aware that he is approved for gel injection.  Approved, Orthovisc series, bilateral knee. [place] Patient will be responsible for 20% OOP. Co-pay of $25.00 each visit PA required PA Approval# [id number] Valid 11/21/2019- 05/22/2020

## 2019-11-21 NOTE — Telephone Encounter (Signed)
Refill request for tramadol, last seen 10-18-19, last filled 10-08-19.  Please advise.

## 2019-12-05 ENCOUNTER — Other Ambulatory Visit: Payer: Self-pay

## 2019-12-05 ENCOUNTER — Encounter: Payer: Self-pay | Admitting: Orthopaedic Surgery

## 2019-12-05 ENCOUNTER — Ambulatory Visit: Payer: Medicare HMO | Admitting: Orthopaedic Surgery

## 2019-12-05 DIAGNOSIS — M170 Bilateral primary osteoarthritis of knee: Secondary | ICD-10-CM | POA: Diagnosis not present

## 2019-12-05 MED ORDER — HYALURONAN 30 MG/2ML IX SOSY
30.0000 mg | PREFILLED_SYRINGE | INTRA_ARTICULAR | Status: AC | PRN
  Administered 2019-12-05: 30 mg via INTRA_ARTICULAR

## 2019-12-05 NOTE — Progress Notes (Signed)
Office Visit Note   Patient: Carlos Crosby           Date of Birth: 12/29/1955           MRN: [id number] Visit Date: 12/05/2019              Requested by: Leone Haven, MD [address] [place],  [place] [id number] PCP: Leone Haven, MD   Assessment & Plan: Visit Diagnoses:  1. Primary osteoarthritis of both knees     Plan: Has been approved for Orthovisc injections both knees.  Will initiate the first series today and return weekly for the next 2 weeks to complete the series  Follow-Up Instructions: Return in about 1 week (around 12/12/2019).   Orders:  No orders of the defined types were placed in this encounter.  No orders of the defined types were placed in this encounter.     Procedures: Large Joint Inj: bilateral knee on 12/05/2019 2:23 PM Indications: pain and joint swelling Details: 25 G 1.5 in needle, anteromedial approach  Arthrogram: No  Medications (Right): 30 mg Hyaluronan 30 MG/2ML Medications (Left): 30 mg Hyaluronan 30 MG/2ML Outcome: tolerated well, no immediate complications Procedure, treatment alternatives, risks and benefits explained, specific risks discussed. Consent was given by the patient. Immediately prior to procedure a time out was called to verify the correct patient, procedure, equipment, support staff and site/side marked as required. Patient was prepped and draped in the usual sterile fashion.       Clinical Data: No additional findings.   Subjective: Chief Complaint  Patient presents with  . Right Knee - Pain  . Left Knee - Pain  Here to start Orthovisc injections both knees  HPI  Review of Systems   Objective: Vital Signs: There were no vitals taken for this visit.  Physical Exam  Ortho Exam small effusions both knees.  Walks with an obvious limp.  Has significant arthritis of both knees involving all 3 compartments.  Increased varus with weightbearing.  Predominant medial joint pain  Specialty  Comments:  No specialty comments available.  Imaging: No results found.   PMFS History: Patient Active Problem List   Diagnosis Date Noted  . Rectal pain 10/18/2019  . Rash 10/08/2019  . Fall 02/05/2019  . Neuropathy 11/16/2017  . Prediabetes 11/16/2017  . Vitamin D deficiency 11/16/2017  . 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 Bleeding nose 05/18/2017  . Allergic rhinitis 01/26/2017  . Blue nevus 05/16/2016  . Hair loss 05/16/2016  . Abdominal pain 05/16/2016  . Umbilical hernia without obstruction and without gangrene 05/16/2016  . Atrial fibrillation ([place]) 09/26/2014  . Morbid obesity ([place]) 09/26/2014  . Chronic diastolic congestive heart failure ([place]) 09/26/2014  . Anxiety 09/26/2014  . Obstructive sleep apnea on CPAP 09/26/2014  . Essential hypertension 09/26/2014  . Hyperlipidemia 09/26/2014  . Osteoarthritis of both knees 09/26/2014  . Kidney stone 09/26/2014   Past Medical History:  Diagnosis Date  . Chronic a-fib ([place])   . Chronic systolic CHF (congestive heart failure) ([place])   . Depression   . Hemochromatosis   . History of kidney stones   . History of pulmonary embolism   . Hyperlipidemia   . Hypertension   . Migraine   . Obesity   . OSA (obstructive sleep apnea)    on CPAP  . Osteoarthritis    bilateral knees  . Rocky Mountain spotted fever 2011    Family History  Problem Relation Age of Onset  .  Thyroid disease Mother   . Cancer Father        lung  . Arthritis Father   . Hypertension Father   . Cancer Paternal Grandfather        Throat cancer    Past Surgical History:  Procedure Laterality Date  . CARDIAC CATHETERIZATION  2012   [facility]  . KNEE SURGERY     right knee   . LITHOTRIPSY  07/31/2014  . URETERAL STENT PLACEMENT  2016  . URETEROSCOPY     Social History   Occupational History  . Not on file  Tobacco Use  . Smoking status: Never Smoker  . Smokeless tobacco: Never Used  Vaping Use  . Vaping Use: Never used    Substance and Sexual Activity  . Alcohol use: No    Comment: occasional; 3 times a year  . Drug use: No  . Sexual activity: Not Currently     Garald Balding, MD   Note - This record has been created using [employer].  Chart creation errors have been sought, but may not always  have been located. Such creation errors do not reflect on  the standard of medical care.

## 2019-12-12 ENCOUNTER — Ambulatory Visit (INDEPENDENT_AMBULATORY_CARE_PROVIDER_SITE_OTHER): Payer: Medicare HMO | Admitting: Orthopaedic Surgery

## 2019-12-12 ENCOUNTER — Other Ambulatory Visit: Payer: Self-pay

## 2019-12-12 ENCOUNTER — Encounter: Payer: Self-pay | Admitting: Orthopaedic Surgery

## 2019-12-12 VITALS — Ht 71.5 in | Wt 299.0 lb

## 2019-12-12 DIAGNOSIS — M170 Bilateral primary osteoarthritis of knee: Secondary | ICD-10-CM

## 2019-12-12 MED ORDER — HYALURONAN 30 MG/2ML IX SOSY
30.0000 mg | PREFILLED_SYRINGE | INTRA_ARTICULAR | Status: AC | PRN
  Administered 2019-12-12: 30 mg via INTRA_ARTICULAR

## 2019-12-12 NOTE — Progress Notes (Signed)
Office Visit Note   Patient: Carlos Crosby           Date of Birth: 10-11-1955           MRN: [id number] Visit Date: 12/12/2019              Requested by: Leone Haven, MD [address] [place],  [place] [address] PCP: Leone Haven, MD   Assessment & Plan: Visit Diagnoses:  1. Primary osteoarthritis of both knees     Plan: Second Orthovisc injection both knees today.  No problem with the first injections last week Follow-Up Instructions: Return in about 1 week (around 12/19/2019).   Orders:  Orders Placed This Encounter  Procedures  . Large Joint Inj: bilateral knee   No orders of the defined types were placed in this encounter.     Procedures: Large Joint Inj: bilateral knee on 12/12/2019 1:11 PM Indications: pain and joint swelling Details: 25 G 1.5 in needle, anteromedial approach  Arthrogram: No  Medications (Right): 30 mg Hyaluronan 30 MG/2ML Medications (Left): 30 mg Hyaluronan 30 MG/2ML Outcome: tolerated well, no immediate complications Procedure, treatment alternatives, risks and benefits explained, specific risks discussed. Consent was given by the patient. Immediately prior to procedure a time out was called to verify the correct patient, procedure, equipment, support staff and site/side marked as required. Patient was prepped and draped in the usual sterile fashion.       Clinical Data: No additional findings.   Subjective: Chief Complaint  Patient presents with  . Left Knee - Follow-up    orthovisc started 12-05-2019  . Right Knee - Follow-up    orthovisc started 12-05-2019  Patient presents today for the second Orthovisc injections bilaterally. He started the injections on 12-05-2019.   HPI  Review of Systems   Objective: Vital Signs: Ht 5' 11.5" (1.816 m)   Wt 299 lb (135.6 kg)   BMI 41.12 kg/m   Physical Exam Constitutional:      Appearance: He is well-developed.  Eyes:     Pupils: Pupils are equal, round, and  reactive to light.  Pulmonary:     Effort: Pulmonary effort is normal.  Skin:    General: Skin is warm and dry.  Neurological:     Mental Status: He is alert and oriented to person, place, and time.  Psychiatric:        Behavior: Behavior normal.     Ortho Exam Small effusion right knee.  No effusion left knee.  Knees were not hot warm or red Specialty Comments:  No specialty comments available.  Imaging: No results found.   PMFS History: Patient Active Problem List   Diagnosis Date Noted  . Rectal pain 10/18/2019  . Rash 10/08/2019  . Fall 02/05/2019  . Neuropathy 11/16/2017  . Prediabetes 11/16/2017  . Vitamin D deficiency 11/16/2017  . Hemochromatosis, hereditary ([place]) 07/23/2017  . Bleeding nose 05/18/2017  . Allergic rhinitis 01/26/2017  . Blue nevus 05/16/2016  . Hair loss 05/16/2016  . Abdominal pain 05/16/2016  . Umbilical hernia without obstruction and without gangrene 05/16/2016  . Atrial fibrillation ([place]) 09/26/2014  . Morbid obesity ([place]) 09/26/2014  . Chronic diastolic congestive heart failure ([place]) 09/26/2014  . Anxiety 09/26/2014  . Obstructive sleep apnea on CPAP 09/26/2014  . Essential hypertension 09/26/2014  . Hyperlipidemia 09/26/2014  . Osteoarthritis of both knees 09/26/2014  . Kidney stone 09/26/2014   Past Medical History:  Diagnosis Date  . Chronic a-fib ([place])   .  Chronic systolic CHF (congestive heart failure) ([place])   . Depression   . Hemochromatosis   . History of kidney stones   . History of pulmonary embolism   . Hyperlipidemia   . Hypertension   . Migraine   . Obesity   . OSA (obstructive sleep apnea)    on CPAP  . Osteoarthritis    bilateral knees  . Rocky Mountain spotted fever 2011    Family History  Problem Relation Age of Onset  . Thyroid disease Mother   . Cancer Father        lung  . Arthritis Father   . Hypertension Father   . Cancer Paternal Grandfather        Throat cancer    Past Surgical History:    Procedure Laterality Date  . CARDIAC CATHETERIZATION  2012   [facility]  . KNEE SURGERY     right knee   . LITHOTRIPSY  07/31/2014  . URETERAL STENT PLACEMENT  2016  . URETEROSCOPY     Social History   Occupational History  . Not on file  Tobacco Use  . Smoking status: Never Smoker  . Smokeless tobacco: Never Used  Vaping Use  . Vaping Use: Never used  Substance and Sexual Activity  . Alcohol use: No    Comment: occasional; 3 times a year  . Drug use: No  . Sexual activity: Not Currently

## 2019-12-13 ENCOUNTER — Other Ambulatory Visit: Payer: Self-pay | Admitting: Internal Medicine

## 2019-12-13 ENCOUNTER — Other Ambulatory Visit: Payer: Self-pay | Admitting: Cardiovascular Disease

## 2019-12-19 ENCOUNTER — Ambulatory Visit (INDEPENDENT_AMBULATORY_CARE_PROVIDER_SITE_OTHER): Payer: Medicare HMO | Admitting: Orthopaedic Surgery

## 2019-12-19 ENCOUNTER — Other Ambulatory Visit: Payer: Self-pay

## 2019-12-19 ENCOUNTER — Encounter: Payer: Self-pay | Admitting: Orthopaedic Surgery

## 2019-12-19 VITALS — Ht 71.5 in | Wt 299.0 lb

## 2019-12-19 DIAGNOSIS — M170 Bilateral primary osteoarthritis of knee: Secondary | ICD-10-CM | POA: Diagnosis not present

## 2019-12-19 MED ORDER — HYALURONAN 30 MG/2ML IX SOSY
30.0000 mg | PREFILLED_SYRINGE | INTRA_ARTICULAR | Status: AC | PRN
  Administered 2019-12-19: 30 mg via INTRA_ARTICULAR

## 2019-12-19 NOTE — Progress Notes (Signed)
Office Visit Note   Patient: Carlos Crosby           Date of Birth: 12-03-1955           MRN: [id number] Visit Date: 12/19/2019              Requested by: Leone Haven, MD [address] [place],  [place] [address] PCP: Leone Haven, MD   Assessment & Plan: Visit Diagnoses:  1. Primary osteoarthritis of both knees     Plan: Mr. Scheck has had 2 prior Orthovisc injections in both knees and relates that they have made a difference.  He does not have nearly as much pain or feeling of his knees giving way.  We will proceed with the third and final injection today and monitor his response as needed  Follow-Up Instructions: Return if symptoms worsen or fail to improve.   Orders:  Orders Placed This Encounter  Procedures  . Large Joint Inj: bilateral knee   No orders of the defined types were placed in this encounter.     Procedures: Large Joint Inj: bilateral knee on 12/19/2019 1:04 PM Indications: pain and joint swelling Details: 25 G 1.5 in needle, anteromedial approach  Arthrogram: No  Medications (Right): 30 mg Hyaluronan 30 MG/2ML Medications (Left): 30 mg Hyaluronan 30 MG/2ML Outcome: tolerated well, no immediate complications Procedure, treatment alternatives, risks and benefits explained, specific risks discussed. Consent was given by the patient. Immediately prior to procedure a time out was called to verify the correct patient, procedure, equipment, support staff and site/side marked as required. Patient was prepped and draped in the usual sterile fashion.       Clinical Data: No additional findings.   Subjective: Chief Complaint  Patient presents with  . Right Knee - Follow-up    Orthovisc started 12-05-2019  . Left Knee - Follow-up    Orthovisc started 12-05-2019  Patient presents today for the third Orthovisc injections bilaterally. He started the injections on 12-05-2019. He has noticed improvement in both knees.   HPI  Review of  Systems   Objective: Vital Signs: Ht 5' 11.5" (1.816 m)   Wt 299 lb (135.6 kg)   BMI 41.12 kg/m   Physical Exam  Ortho Exam small effusions both knees.  Knees were not hot or warm.  Significant varus both knees with patella crepitation.  No obvious instability.  Walks with a duck waddling gait but relates he is not in any significant pain  Specialty Comments:  No specialty comments available.  Imaging: No results found.   PMFS History: Patient Active Problem List   Diagnosis Date Noted  . Rectal pain 10/18/2019  . Rash 10/08/2019  . Fall 02/05/2019  . Neuropathy 11/16/2017  . Prediabetes 11/16/2017  . Vitamin D deficiency 11/16/2017  . 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 Bleeding nose 05/18/2017  . Allergic rhinitis 01/26/2017  . Blue nevus 05/16/2016  . Hair loss 05/16/2016  . Abdominal pain 05/16/2016  . Umbilical hernia without obstruction and without gangrene 05/16/2016  . Atrial fibrillation ([place]) 09/26/2014  . Morbid obesity ([place]) 09/26/2014  . Chronic diastolic congestive heart failure ([place]) 09/26/2014  . Anxiety 09/26/2014  . Obstructive sleep apnea on CPAP 09/26/2014  . Essential hypertension 09/26/2014  . Hyperlipidemia 09/26/2014  . Osteoarthritis of both knees 09/26/2014  . Kidney stone 09/26/2014   Past Medical History:  Diagnosis Date  . Chronic a-fib ([facility])   . Chronic systolic CHF (congestive heart failure) ([facility])   .  Depression   . Hemochromatosis   . History of kidney stones   . History of pulmonary embolism   . Hyperlipidemia   . Hypertension   . Migraine   . Obesity   . OSA (obstructive sleep apnea)    on CPAP  . Osteoarthritis    bilateral knees  . Rocky Mountain spotted fever 2011    Family History  Problem Relation Age of Onset  . Thyroid disease Mother   . Cancer Father        lung  . Arthritis Father   . Hypertension Father   . Cancer Paternal Grandfather        Throat cancer    Past Surgical History:    Procedure Laterality Date  . CARDIAC CATHETERIZATION  2012   [facility]  . KNEE SURGERY     right knee   . LITHOTRIPSY  07/31/2014  . URETERAL STENT PLACEMENT  2016  . URETEROSCOPY     Social History   Occupational History  . Not on file  Tobacco Use  . Smoking status: Never Smoker  . Smokeless tobacco: Never Used  Vaping Use  . Vaping Use: Never used  Substance and Sexual Activity  . Alcohol use: No    Comment: occasional; 3 times a year  . Drug use: No  . Sexual activity: Not Currently

## 2019-12-24 ENCOUNTER — Other Ambulatory Visit: Payer: Self-pay | Admitting: Family Medicine

## 2019-12-24 DIAGNOSIS — D229 Melanocytic nevi, unspecified: Secondary | ICD-10-CM | POA: Diagnosis not present

## 2019-12-24 DIAGNOSIS — L820 Inflamed seborrheic keratosis: Secondary | ICD-10-CM | POA: Diagnosis not present

## 2019-12-24 DIAGNOSIS — L814 Other melanin hyperpigmentation: Secondary | ICD-10-CM | POA: Diagnosis not present

## 2019-12-24 DIAGNOSIS — L821 Other seborrheic keratosis: Secondary | ICD-10-CM | POA: Diagnosis not present

## 2020-01-08 ENCOUNTER — Encounter: Payer: Self-pay | Admitting: Family Medicine

## 2020-01-10 NOTE — Telephone Encounter (Signed)
Spoken with patient. The sx have gotten better and would like to hold off on appointment at the moment but if sx change and return he will call and schedule appointment.

## 2020-01-17 ENCOUNTER — Other Ambulatory Visit: Payer: Self-pay

## 2020-01-17 NOTE — Progress Notes (Signed)
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  INTERVAL HISTORY: Patient agreed to evaluation and discussion of his laboratory work via video enabled telemedicine visit.  He continues to feel well and remains asymptomatic. He has no neurologic complaints.  He denies any recent fevers or illnesses.  He denies any chest pain, shortness of breath, cough, or hemoptysis.  He denies any nausea, vomiting, constipation, or diarrhea.  He has no urinary complaints.  Patient feels at his baseline offers no specific complaints today.  REVIEW OF SYSTEMS:   Review of Systems  Constitutional: Negative.  Negative for fever, malaise/fatigue and weight loss.  Respiratory: Negative.  Negative for cough, hemoptysis and shortness of breath.   Cardiovascular: Negative.  Negative for chest pain and leg swelling.  Gastrointestinal: Negative.  Negative for abdominal pain, blood in stool and melena.  Genitourinary: Negative.  Negative for hematuria.  Musculoskeletal: Negative.  Negative for back pain.  Skin: Negative.  Negative for rash.  Neurological: Negative.  Negative for sensory change, focal weakness, weakness and headaches.  Psychiatric/Behavioral: Negative.  The patient is not nervous/anxious.     As per HPI. Otherwise, a complete review of systems is negative.  PAST MEDICAL HISTORY: Past Medical History:  Diagnosis Date  . Chronic a-fib ([place])   . Chronic systolic CHF (congestive heart failure) ([place])   . Depression   . Hemochromatosis   . History of kidney stones   . History of pulmonary embolism   . Hyperlipidemia   . Hypertension   . Migraine   . Obesity   . OSA  (obstructive sleep apnea)    on CPAP  . Osteoarthritis    bilateral knees  . Rocky Mountain spotted fever 2011    PAST SURGICAL HISTORY: Past Surgical History:  Procedure Laterality Date  . CARDIAC CATHETERIZATION  2012   [facility]  . KNEE SURGERY     right knee   . LITHOTRIPSY  07/31/2014  . URETERAL STENT PLACEMENT  2016  . URETEROSCOPY      FAMILY HISTORY: Family History  Problem Relation Age of Onset  . Thyroid disease Mother   . Cancer Father        lung  . Arthritis Father   . Hypertension Father   . Cancer Paternal Grandfather        Throat cancer    ADVANCED DIRECTIVES (Y/N):  N  HEALTH MAINTENANCE: Social History   Tobacco Use  . Smoking status: Never Smoker  . Smokeless tobacco: Never Used  Vaping Use  . Vaping Use: Never used  Substance Use Topics  . Alcohol use: No    Comment: occasional; 3 times a year  . Drug use: No     Colonoscopy:  PAP:  Bone density:  Lipid panel:  Allergies  Allergen Reactions  . Allopurinol Hives and Swelling  . Other Hives  . Diltiazem Itching and Rash  . Penicillin G Nausea Only    And dirrhea  . Ondansetron Rash  . Ondansetron Hcl Rash  . Penicillins Other (See Comments) and Nausea Only    Pt unsure of rxn; showed up on allergy test Pt unsure of rxn; showed up on allergy test Pt unsure of rxn; showed up on allergy test And  dirrhea     Current Outpatient Medications  Medication Sig Dispense Refill  . acetaminophen (TYLENOL) 500 MG tablet Take 1,000 mg by mouth every 6 (six) hours as needed.    . Cholecalciferol (VITAMIN D) 2000 units CAPS     . digoxin (LANOXIN) 0.125 MG tablet TAKE 1 TABLET SIX DAYS A WEEK AS DIRECTED 78 tablet 2  . ELIQUIS 5 MG TABS tablet TAKE 1 TABLET TWICE DAILY 180 tablet 1  . furosemide (LASIX) 40 MG tablet Take 40 mg by mouth daily as needed.     Marland Kitchen lisinopril (ZESTRIL) 2.5 MG tablet TAKE 1 TABLET EVERY DAY 90 tablet 3  . loratadine (CLARITIN) 10 MG tablet Take 10 mg by mouth daily.     . metoprolol succinate (TOPROL-XL) 100 MG 24 hr tablet Take 2 tablets every morning and 1 tablet every evening qd. 270 tablet 3  . Multiple Vitamins-Minerals (CENTRUM SILVER ADULT 50+) TABS Take 1 tablet by mouth daily.    . Potassium 95 MG TABS Take 1 tablet by mouth.    . pravastatin (PRAVACHOL) 80 MG tablet TAKE 1 TABLET EVERY EVENING 90 tablet 0  . traMADol (ULTRAM) 50 MG tablet TAKE 1 TABLET(50 MG) BY MOUTH EVERY 6 HOURS AS NEEDED FOR PAIN 90 tablet 0  . verapamil (CALAN-SR) 120 MG CR tablet TAKE 1 TABLET EVERY DAY 90 tablet 2   No current facility-administered medications for this visit.    OBJECTIVE: There were no vitals filed for this visit.   There is no height or weight on file to calculate BMI.    ECOG FS:0 - Asymptomatic  General: Well-developed, well-nourished, no acute distress. HEENT: Normocephalic. Neuro: Alert, answering all questions appropriately. Cranial nerves grossly intact. Psych: Normal affect.  LAB RESULTS:  Lab Results  Component Value Date   NA 138 09/05/2019   K 4.6 09/05/2019   CL 102 09/05/2019   CO2 22 09/05/2019   GLUCOSE 99 09/05/2019   BUN 17 09/05/2019   CREATININE 0.87 09/05/2019   CALCIUM 9.7 09/05/2019   PROT 6.7 02/05/2019   ALBUMIN 4.3 02/05/2019   AST 18 02/05/2019   ALT 20 02/05/2019   ALKPHOS 41 02/05/2019   BILITOT 1.1 02/05/2019   GFRNONAA 92 09/05/2019   GFRAA 107 09/05/2019    Lab Results  Component Value Date   WBC 7.7 07/24/2019   NEUTROABS 4.4 07/24/2019   HGB 17.1 (H) 07/24/2019   HCT 50.9 07/24/2019   MCV 99.0 07/24/2019   PLT 199 07/24/2019   Lab Results  Component Value Date   IRON 184 (H) 07/24/2019   TIBC 351 07/24/2019   IRONPCTSAT 52 (H) 07/24/2019   Lab Results  Component Value Date   FERRITIN 168 07/24/2019     STUDIES: No results found.  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 Compound heterozygous hemochromatosis with C282Y and H63D  mutations: Patient has not had phlebotomy in over a year and his ferritin has trended up from 81-186 over that timeframe.  Goal ferritin is 50-100.  Previously, the remainder of his laboratory work is either negative or within normal limits.  It appears patient will require phlebotomy approximately every 6 months.  Return to clinic next week for 500 mL phlebotomy.  Patient will then return to clinic in 6 months for repeat laboratory work, further evaluation, and continuation of treatment.  Of note, because patient is on Eliquis he cannot receive his phlebotomy through [facility].   2.  Weight loss: Intentional.  Patient states he does not require gastric bypass at this time. 3.  Polycythemia: Likely secondary to elevated iron stores.  Phlebotomy as above.  I provided 20 minutes of face-to-face video visit time during this encounter which included chart review, counseling, and coordination of care as documented above.\    Patient expressed understanding and was in agreement with this plan. He also understands that He can call clinic at any time with any questions, concerns, or complaints.    Lloyd Huger, MD   01/17/2020 8:27 AM

## 2020-01-20 ENCOUNTER — Inpatient Hospital Stay: Payer: Medicare HMO | Attending: Oncology

## 2020-01-20 ENCOUNTER — Other Ambulatory Visit: Payer: Self-pay

## 2020-01-20 DIAGNOSIS — Z8249 Family history of ischemic heart disease and other diseases of the circulatory system: Secondary | ICD-10-CM | POA: Insufficient documentation

## 2020-01-20 DIAGNOSIS — Z8349 Family history of other endocrine, nutritional and metabolic diseases: Secondary | ICD-10-CM | POA: Diagnosis not present

## 2020-01-20 DIAGNOSIS — Z801 Family history of malignant neoplasm of trachea, bronchus and lung: Secondary | ICD-10-CM | POA: Insufficient documentation

## 2020-01-20 DIAGNOSIS — E785 Hyperlipidemia, unspecified: Secondary | ICD-10-CM | POA: Insufficient documentation

## 2020-01-20 DIAGNOSIS — I5022 Chronic systolic (congestive) heart failure: Secondary | ICD-10-CM | POA: Diagnosis not present

## 2020-01-20 DIAGNOSIS — Z86711 Personal history of pulmonary embolism: Secondary | ICD-10-CM | POA: Insufficient documentation

## 2020-01-20 DIAGNOSIS — E669 Obesity, unspecified: Secondary | ICD-10-CM | POA: Insufficient documentation

## 2020-01-20 DIAGNOSIS — I110 Hypertensive heart disease with heart failure: Secondary | ICD-10-CM | POA: Insufficient documentation

## 2020-01-20 DIAGNOSIS — Z7901 Long term (current) use of anticoagulants: Secondary | ICD-10-CM | POA: Diagnosis not present

## 2020-01-20 DIAGNOSIS — D751 Secondary polycythemia: Secondary | ICD-10-CM | POA: Diagnosis not present

## 2020-01-20 DIAGNOSIS — G4733 Obstructive sleep apnea (adult) (pediatric): Secondary | ICD-10-CM | POA: Insufficient documentation

## 2020-01-20 DIAGNOSIS — Z8261 Family history of arthritis: Secondary | ICD-10-CM | POA: Diagnosis not present

## 2020-01-20 DIAGNOSIS — Z79899 Other long term (current) drug therapy: Secondary | ICD-10-CM | POA: Insufficient documentation

## 2020-01-20 LAB — CBC WITH DIFFERENTIAL/PLATELET
Abs Immature Granulocytes: 0.03 10*3/uL (ref 0.00–0.07)
Basophils Absolute: 0.1 10*3/uL (ref 0.0–0.1)
Basophils Relative: 1 %
Eosinophils Absolute: 0.2 10*3/uL (ref 0.0–0.5)
Eosinophils Relative: 2 %
HCT: 49.2 % (ref 39.0–52.0)
Hemoglobin: 17.9 g/dL — ABNORMAL HIGH (ref 13.0–17.0)
Immature Granulocytes: 0 %
Lymphocytes Relative: 33 %
Lymphs Abs: 3.1 10*3/uL (ref 0.7–4.0)
MCH: 34.4 pg — ABNORMAL HIGH (ref 26.0–34.0)
MCHC: 36.4 g/dL — ABNORMAL HIGH (ref 30.0–36.0)
MCV: 94.4 fL (ref 80.0–100.0)
Monocytes Absolute: 0.7 10*3/uL (ref 0.1–1.0)
Monocytes Relative: 8 %
Neutro Abs: 5.2 10*3/uL (ref 1.7–7.7)
Neutrophils Relative %: 56 %
Platelets: 196 10*3/uL (ref 150–400)
RBC: 5.21 MIL/uL (ref 4.22–5.81)
RDW: 12.8 % (ref 11.5–15.5)
WBC: 9.3 10*3/uL (ref 4.0–10.5)
nRBC: 0 % (ref 0.0–0.2)

## 2020-01-20 LAB — IRON AND TIBC
Iron: 184 ug/dL — ABNORMAL HIGH (ref 45–182)
Saturation Ratios: 50 % — ABNORMAL HIGH (ref 17.9–39.5)
TIBC: 368 ug/dL (ref 250–450)
UIBC: 184 ug/dL

## 2020-01-20 LAB — FERRITIN: Ferritin: 210 ng/mL (ref 24–336)

## 2020-01-22 ENCOUNTER — Encounter: Payer: Self-pay | Admitting: Oncology

## 2020-01-22 ENCOUNTER — Other Ambulatory Visit: Payer: Self-pay

## 2020-01-23 ENCOUNTER — Other Ambulatory Visit: Payer: Self-pay

## 2020-01-23 ENCOUNTER — Inpatient Hospital Stay (HOSPITAL_BASED_OUTPATIENT_CLINIC_OR_DEPARTMENT_OTHER): Payer: Medicare HMO | Admitting: Oncology

## 2020-01-23 ENCOUNTER — Inpatient Hospital Stay: Payer: Medicare HMO

## 2020-01-23 DIAGNOSIS — I5022 Chronic systolic (congestive) heart failure: Secondary | ICD-10-CM | POA: Diagnosis not present

## 2020-01-23 DIAGNOSIS — I110 Hypertensive heart disease with heart failure: Secondary | ICD-10-CM | POA: Diagnosis not present

## 2020-01-23 DIAGNOSIS — Z7901 Long term (current) use of anticoagulants: Secondary | ICD-10-CM | POA: Diagnosis not present

## 2020-01-23 DIAGNOSIS — E669 Obesity, unspecified: Secondary | ICD-10-CM | POA: Diagnosis not present

## 2020-01-23 DIAGNOSIS — Z86711 Personal history of pulmonary embolism: Secondary | ICD-10-CM | POA: Diagnosis not present

## 2020-01-23 DIAGNOSIS — E785 Hyperlipidemia, unspecified: Secondary | ICD-10-CM | POA: Diagnosis not present

## 2020-01-23 DIAGNOSIS — G4733 Obstructive sleep apnea (adult) (pediatric): Secondary | ICD-10-CM | POA: Diagnosis not present

## 2020-01-23 DIAGNOSIS — D751 Secondary polycythemia: Secondary | ICD-10-CM | POA: Diagnosis not present

## 2020-02-01 ENCOUNTER — Other Ambulatory Visit: Payer: Self-pay | Admitting: Family Medicine

## 2020-02-25 ENCOUNTER — Ambulatory Visit (INDEPENDENT_AMBULATORY_CARE_PROVIDER_SITE_OTHER): Payer: Medicare HMO | Admitting: Family Medicine

## 2020-02-25 ENCOUNTER — Encounter: Payer: Self-pay | Admitting: Family Medicine

## 2020-02-25 ENCOUNTER — Other Ambulatory Visit: Payer: Self-pay

## 2020-02-25 VITALS — BP 122/70 | HR 88 | Temp 97.6°F | Ht 71.0 in | Wt 309.8 lb

## 2020-02-25 DIAGNOSIS — Z23 Encounter for immunization: Secondary | ICD-10-CM

## 2020-02-25 DIAGNOSIS — Z0000 Encounter for general adult medical examination without abnormal findings: Secondary | ICD-10-CM | POA: Diagnosis not present

## 2020-02-25 DIAGNOSIS — I4891 Unspecified atrial fibrillation: Secondary | ICD-10-CM | POA: Diagnosis not present

## 2020-02-25 DIAGNOSIS — Z125 Encounter for screening for malignant neoplasm of prostate: Secondary | ICD-10-CM | POA: Diagnosis not present

## 2020-02-25 DIAGNOSIS — E7800 Pure hypercholesterolemia, unspecified: Secondary | ICD-10-CM | POA: Diagnosis not present

## 2020-02-25 LAB — PSA: PSA: 0.17 ng/mL (ref 0.10–4.00)

## 2020-02-25 LAB — COMPREHENSIVE METABOLIC PANEL
ALT: 24 U/L (ref 0–53)
AST: 20 U/L (ref 0–37)
Albumin: 4.5 g/dL (ref 3.5–5.2)
Alkaline Phosphatase: 42 U/L (ref 39–117)
BUN: 25 mg/dL — ABNORMAL HIGH (ref 6–23)
CO2: 26 mEq/L (ref 19–32)
Calcium: 9.2 mg/dL (ref 8.4–10.5)
Chloride: 101 mEq/L (ref 96–112)
Creatinine, Ser: 0.92 mg/dL (ref 0.40–1.50)
GFR: 83.07 mL/min (ref >60.00)
Glucose, Bld: 95 mg/dL (ref 70–99)
Potassium: 4.7 mEq/L (ref 3.5–5.1)
Sodium: 136 mEq/L (ref 135–145)
Total Bilirubin: 1 mg/dL (ref 0.2–1.2)
Total Protein: 7 g/dL (ref 6.0–8.3)

## 2020-02-25 LAB — HEMOGLOBIN A1C: Hgb A1c MFr Bld: 5.3 % (ref 4.6–6.5)

## 2020-02-25 LAB — LIPID PANEL
Cholesterol: 151 mg/dL (ref 0–200)
HDL: 44.6 mg/dL (ref >39.00)
LDL Cholesterol: 77 mg/dL (ref 0–99)
NonHDL: 106.76
Total CHOL/HDL Ratio: 3
Triglycerides: 147 mg/dL (ref 0.0–149.0)
VLDL: 29.4 mg/dL (ref 0.0–40.0)

## 2020-02-25 MED ORDER — SEMAGLUTIDE-WEIGHT MANAGEMENT 0.5 MG/0.5ML ~~LOC~~ SOAJ: 0.5000 mg | SUBCUTANEOUS | 0 refills | Status: DC

## 2020-02-25 MED ORDER — SEMAGLUTIDE-WEIGHT MANAGEMENT 1.7 MG/0.75ML ~~LOC~~ SOAJ: 1.7000 mg | SUBCUTANEOUS | 0 refills | Status: DC

## 2020-02-25 MED ORDER — SEMAGLUTIDE-WEIGHT MANAGEMENT 2.4 MG/0.75ML ~~LOC~~ SOAJ: 2.4000 mg | SUBCUTANEOUS | 0 refills | Status: DC

## 2020-02-25 MED ORDER — SEMAGLUTIDE-WEIGHT MANAGEMENT 0.25 MG/0.5ML ~~LOC~~ SOAJ: 0.2500 mg | SUBCUTANEOUS | 0 refills | Status: AC

## 2020-02-25 MED ORDER — SEMAGLUTIDE-WEIGHT MANAGEMENT 1 MG/0.5ML ~~LOC~~ SOAJ: 1.0000 mg | SUBCUTANEOUS | 0 refills | Status: DC

## 2020-02-25 NOTE — Progress Notes (Signed)
[name], MD Phone: [phone]  Carlos Crosby is a 64 y.o. male who presents today for CPE.  Diet: cut out most carbs, did recently have an increase in portion sizes Exercise: swims Colonoscopy: cologuard completed 06/22/17 Prostate cancer screening: due Family history-  Prostate cancer: no  Colon cancer: no Vaccines-   Flu: due  Tetanus: UTD  Shingles: declines due to insurance  COVID19: UTD  Pneumonia: UTD HIV screening: declines, low risk Hep C Screening: UTD Tobacco use: no Alcohol use: no Illicit Drug use: no Dentist: yes Ophthalmology: yes Patient requests a digoxin level for his cardiologist.    Active Ambulatory Problems    Diagnosis Date Noted  . Atrial fibrillation ([place]) 09/26/2014  . Morbid obesity ([place]) 09/26/2014  . Chronic diastolic congestive heart failure ([place]) 09/26/2014  . Anxiety 09/26/2014  . Obstructive sleep apnea on CPAP 09/26/2014  . Essential hypertension 09/26/2014  . Hyperlipidemia 09/26/2014  . Osteoarthritis of both knees 09/26/2014  . Kidney stone 09/26/2014  . Blue nevus 05/16/2016  . Hair loss 05/16/2016  . Abdominal pain 05/16/2016  . Umbilical hernia without obstruction and without gangrene 05/16/2016  . Allergic rhinitis 01/26/2017  . Bleeding nose 05/18/2017  . Hemochromatosis, hereditary ([place]) 07/23/2017  . Neuropathy 11/16/2017  . Prediabetes 11/16/2017  . Vitamin D deficiency 11/16/2017  . Fall 02/05/2019  . Rash 10/08/2019  . Rectal pain 10/18/2019  . Routine general medical examination at a health care facility 02/25/2020   Resolved Ambulatory Problems    Diagnosis Date Noted  . Hematuria 10/26/2016  . Cellulitis 10/26/2016   Past Medical History:  Diagnosis Date  . Chronic a-fib ([place])   . Chronic systolic CHF (congestive heart failure) ([place])   . Depression   . Hemochromatosis   . History of kidney stones   . History of pulmonary embolism   . Hypertension   . Migraine   . Obesity   . OSA  (obstructive sleep apnea)   . Osteoarthritis   . Rocky Mountain spotted fever 2011    Family History  Problem Relation Age of Onset  . Thyroid disease Mother   . Cancer Father        lung  . Arthritis Father   . Hypertension Father   . Cancer Paternal Grandfather        Throat cancer    Social History   Socioeconomic History  . Marital status: Married    Spouse name: Not on file  . Number of children: Not on file  . Years of education: Not on file  . Highest education level: Not on file  Occupational History  . Not on file  Tobacco Use  . Smoking status: Never Smoker  . Smokeless tobacco: Never Used  Vaping Use  . Vaping Use: Never used  Substance and Sexual Activity  . Alcohol use: No    Comment: occasional; 3 times a year  . Drug use: No  . Sexual activity: Not Currently  Other Topics Concern  . Not on file  Social History Narrative  . Not on file   Social Determinants of Health   Financial Resource Strain:   . Difficulty of Paying Living Expenses: Not on file  Food Insecurity:   . Worried About Charity fundraiser in the Last Year: Not on file  . Ran Out of Food in the Last Year: Not on file  Transportation Needs:   . Lack of Transportation (Medical): Not on file  . Lack of Transportation (Non-Medical): Not on  file  Physical Activity:   . Days of Exercise per Week: Not on file  . Minutes of Exercise per Session: Not on file  Stress:   . Feeling of Stress : Not on file  Social Connections:   . Frequency of Communication with Friends and Family: Not on file  . Frequency of Social Gatherings with Friends and Family: Not on file  . Attends Religious Services: Not on file  . Active Member of Clubs or Organizations: Not on file  . Attends Banker Meetings: Not on file  . Marital Status: Not on file  Intimate Partner Violence:   . Fear of Current or Ex-Partner: Not on file  . Emotionally Abused: Not on file  . Physically Abused: Not on file    . Sexually Abused: Not on file    ROS  General:  Negative for nexplained weight loss, fever Skin: Negative for new or changing mole, sore that won't heal HEENT: Negative for trouble hearing, trouble seeing, ringing in ears, mouth sores, hoarseness, change in voice, dysphagia. CV:  Negative for chest pain, dyspnea, edema, palpitations Resp: Negative for cough, dyspnea, hemoptysis GI: Negative for nausea, vomiting, diarrhea, constipation, abdominal pain, melena, hematochezia. GU: Negative for dysuria, incontinence, urinary hesitance, hematuria, vaginal or penile discharge, polyuria, sexual difficulty, lumps in testicle or breasts MSK: Positive for chronic joint pain, negative for muscle cramps or aches Neuro: Negative for headaches, weakness, numbness, dizziness, passing out/fainting Psych: Negative for depression, anxiety, memory problems  Objective  Physical Exam Vitals:   02/25/20 0828  BP: 122/70  Pulse: 88  Temp: 97.6 F (36.4 C)  SpO2: 98%    BP Readings from Last 3 Encounters:  02/25/20 122/70  01/23/20 119/78  01/23/20 129/90   Wt Readings from Last 3 Encounters:  02/25/20 (!) 309 lb 12.8 oz (140.5 kg)  01/23/20 (!) 309 lb 4.8 oz (140.3 kg)  12/19/19 299 lb (135.6 kg)    Physical Exam Constitutional:      General: He is not in acute distress.    Appearance: He is not diaphoretic.  HENT:     Head: Normocephalic and atraumatic.  Eyes:     Conjunctiva/sclera: Conjunctivae normal.     Pupils: Pupils are equal, round, and reactive to light.  Cardiovascular:     Rate and Rhythm: Normal rate and regular rhythm.     Heart sounds: Normal heart sounds.  Pulmonary:     Effort: Pulmonary effort is normal.     Breath sounds: Normal breath sounds.  Abdominal:     General: Bowel sounds are normal. There is no distension.     Palpations: Abdomen is soft.     Tenderness: There is no abdominal tenderness. There is no guarding or rebound.  Musculoskeletal:     Right  lower leg: No edema.     Left lower leg: No edema.  Lymphadenopathy:     Cervical: No cervical adenopathy.  Skin:    General: Skin is warm and dry.  Neurological:     Mental Status: He is alert.  Psychiatric:        Mood and Affect: Mood normal.      Assessment/Plan:   Routine general medical examination at a health care facility Physical exam completed.  Encouraged decreasing portion sizes.  Discussed continuing with exercise.  Discussed medication management for obesity given difficulty losing weight with appropriate diet and exercise.  Patient is agreeable to this.  Please see documentation below.  Patient declines HIV screening.  Notes  he is low risk.  Flu vaccine given today.  Declines Shingrix given insurance will cover it fully.  Lab work as outlined below.  Morbid obesity He has done very well with diet and exercise and is down about 60 pounds from his heaviest weight though has plateaued around 300 pounds.  Discussed the option of medication management for this and we will prescribe wegovy is a once weekly injection to help with weight loss.  Discussed that medullary thyroid cancer has been seen in rats studies though not in humans.  He denies personal or family history of thyroid cancer, parathyroid cancer, or adrenal gland cancer.  Discussed that if he ever develop discomfort or swelling in his anterior neck he needs to let us know right away.  Follow-up in 6 weeks.   Orders Placed This Encounter  Procedures  . Comp Met (CMET)  . Lipid panel  . HgB A1c  . PSA  . Digoxin level    Meds ordered this encounter  Medications  . Semaglutide-Weight Management 0.25 MG/0.5ML SOAJ    Sig: Inject 0.25 mg into the skin once a week for 28 days.    Dispense:  2 mL    Refill:  0  . Semaglutide-Weight Management 0.5 MG/0.5ML SOAJ    Sig: Inject 0.5 mg into the skin once a week for 28 days.    Dispense:  2 mL    Refill:  0  . Semaglutide-Weight Management 1 MG/0.5ML SOAJ    Sig:  Inject 1 mg into the skin once a week for 28 days.    Dispense:  2 mL    Refill:  0  . Semaglutide-Weight Management 1.7 MG/0.75ML SOAJ    Sig: Inject 1.7 mg into the skin once a week for 28 days.    Dispense:  3 mL    Refill:  0  . Semaglutide-Weight Management 2.4 MG/0.75ML SOAJ    Sig: Inject 2.4 mg into the skin once a week for 28 days.    Dispense:  3 mL    Refill:  0    This visit occurred during the SARS-CoV-2 public health emergency.  Safety protocols were in place, including screening questions prior to the visit, additional usage of staff PPE, and extensive cleaning of exam room while observing appropriate contact time as indicated for disinfecting solutions.    [name], MD [place]

## 2020-02-25 NOTE — Assessment & Plan Note (Signed)
Physical exam completed.  Encouraged decreasing portion sizes.  Discussed continuing with exercise.  Discussed medication management for obesity given difficulty losing weight with appropriate diet and exercise.  Patient is agreeable to this.  Please see documentation below.  Patient declines HIV screening.  Notes he is low risk.  Flu vaccine given today.  Declines Shingrix given insurance will cover it fully.  Lab work as outlined below.

## 2020-02-25 NOTE — Assessment & Plan Note (Signed)
He has done very well with diet and exercise and is down about 60 pounds from his heaviest weight though has plateaued around 300 pounds.  Discussed the option of medication management for this and we will prescribe wegovy is a once weekly injection to help with weight loss.  Discussed that medullary thyroid cancer has been seen in rats studies though not in humans.  He denies personal or family history of thyroid cancer, parathyroid cancer, or adrenal gland cancer.  Discussed that if he ever develop discomfort or swelling in his anterior neck he needs to let us know right away.  Follow-up in 6 weeks.

## 2020-02-25 NOTE — Patient Instructions (Addendum)
Nice to see you. We will try the wegovy for weight loss.  If it is not affordable please let us know.  If you ever notice a lump in your throat or neck or trouble swallowing or pain in that area please let us know right away. Please continue to work on diet and exercise. We will check labs today and contact you with the results.

## 2020-02-26 LAB — DIGOXIN LEVEL: Digoxin Level: 0.5 mcg/L — ABNORMAL LOW (ref 0.8–2.0)

## 2020-02-27 ENCOUNTER — Encounter: Payer: Self-pay | Admitting: *Deleted

## 2020-03-01 NOTE — Progress Notes (Signed)
Cardiology Office Note  Date:  03/02/2020   ID:  Carlos Crosby, DOB 06/08/1955, MRN [id number]  PCP:  Leone Haven, MD   Chief Complaint  Patient presents with  . office visit    12 month F/U; Meds verbally reviewed with patient.    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 who presents for routine follow-up of his atrial fibrillation  Last seen in clinic September 2019 At that time he was preparing for gastric sleeve with Dr. Hassell Done, [facility] surgery to be completed at [facility]  Seen by telemedicine September 2020 He did not go through with the striae surgery, with losing weight by watching his carbohydrates At the time reported he will was 279 pounds, was swimming and his goal weight was 220  Recently seen by primary care, He has been pursuing diet and exercise, weight is down 60 pounds, plateaued at 300 Started on a weekly injection for weight loss through primary care  He has periodic phlebotomy for hemochromatosis Also with some polycythemia secondary to elevated iron stores  Seen by EP Dr. Caryl Comes April 2021 Permanent atrial fibrillation, rate in the 80s Walking with a dog 2 to 3 days a week Dig level check  Digoxin level 0.5 Hemoglobin A1c 5.3 Total cholesterol 151 LDL 77  Worried about metoprolol causing weight But does not really want to change the metoprolol dose Also is on verapamil for rate  control  Previously with problems with his severe joint pain, mobility Does some swimming for exercise  EKG personally reviewed by myself on todays visit Shows atrial fibrillation ventricular rate 83 bpm, no ST or T wave changes   Other past medical history reviewed ECHO 11/24/2015: EF 45 to 50% Stress test : 12/21/2015 No ischemia, EF 42% echo in 05/2015: EF 30 to 35%  outpatient left lithotripsy 08/28/2014  found to have atrial fibrillation with RVR with heart rates up to 170s prior to the procedure and this was canceled. He was kept overnight in the hospital and heart rate returned to normal on his regular medication regiment, metoprolol succinate 100 mg twice a day   PMH:   has a past medical history of Chronic a-fib ([place]), Chronic systolic CHF (congestive heart failure) ([place]), Depression, Hemochromatosis, History of kidney stones, History of pulmonary embolism, Hyperlipidemia, Hypertension, Migraine, Obesity, OSA (obstructive sleep apnea), Osteoarthritis, and Rocky Mountain spotted fever (2011).  PSH:    Past Surgical History:  Procedure Laterality Date  . CARDIAC CATHETERIZATION  2012   [facility]  . KNEE SURGERY     right knee   . LITHOTRIPSY  07/31/2014  . URETERAL STENT PLACEMENT  2016  . URETEROSCOPY      Current Outpatient Medications  Medication Sig Dispense Refill  . acetaminophen (TYLENOL) 500 MG tablet Take 1,000 mg by mouth every 6 (six) hours as needed.    . Cholecalciferol (VITAMIN D) 2000 units CAPS daily.     . digoxin (LANOXIN) 0.125 MG tablet TAKE 1 TABLET SIX DAYS  A WEEK AS DIRECTED 78 tablet 2  . ELIQUIS 5 MG TABS tablet TAKE 1 TABLET TWICE DAILY 180 tablet 1  . furosemide (LASIX) 40 MG tablet Take 40 mg by mouth daily as needed.     Marland Kitchen lisinopril (ZESTRIL) 2.5 MG tablet TAKE 1 TABLET EVERY DAY 90 tablet 3  . loratadine (CLARITIN) 10 MG tablet Take 10 mg by mouth daily.    . metoprolol succinate (TOPROL-XL) 100 MG 24 hr tablet Take 2 tablets every morning and 1  tablet every evening qd. 270 tablet 3  . Multiple Vitamins-Minerals (CENTRUM SILVER ADULT 50+) TABS Take 1 tablet by mouth daily.    . Potassium 95 MG TABS Take 1 tablet by mouth.    . pravastatin (PRAVACHOL) 80 MG tablet TAKE 1 TABLET EVERY EVENING 90 tablet 0  . traMADol (ULTRAM) 50 MG tablet TAKE 1 TABLET(50 MG) BY MOUTH EVERY 6 HOURS AS NEEDED FOR PAIN 90 tablet 0  . verapamil (CALAN-SR) 120 MG CR tablet TAKE 1 TABLET EVERY DAY 90 tablet 2  . Semaglutide-Weight Management 0.25 MG/0.5ML SOAJ Inject 0.25 mg into the skin once a week for 28 days. (Patient not taking: Reported on 03/02/2020) 2 mL 0  . [START ON 03/25/2020] Semaglutide-Weight Management 0.5 MG/0.5ML SOAJ Inject 0.5 mg into the skin once a week for 28 days. (Patient not taking: Reported on 03/02/2020) 2 mL 0  . [START ON 04/23/2020] Semaglutide-Weight Management 1 MG/0.5ML SOAJ Inject 1 mg into the skin once a week for 28 days. (Patient not taking: Reported on 03/02/2020) 2 mL 0  . [START ON 05/22/2020] Semaglutide-Weight Management 1.7 MG/0.75ML SOAJ Inject 1.7 mg into the skin once a week for 28 days. (Patient not taking: Reported on 03/02/2020) 3 mL 0  . [START ON 06/20/2020] Semaglutide-Weight Management 2.4 MG/0.75ML SOAJ Inject 2.4 mg into the skin once a week for 28 days. (Patient not taking: Reported on 03/02/2020) 3 mL 0   No current facility-administered medications for this visit.     Allergies:   Allopurinol, Diltiazem, Ondansetron, and Penicillins   Social History:  The patient  reports that he has never smoked. He has never used smokeless tobacco. He reports that he does not drink alcohol and does not use drugs.   Family History:   family history includes Arthritis in his father; Cancer in his father and paternal grandfather; Hypertension in his father; Thyroid disease in his mother.    Review of Systems: Review of Systems  Constitutional: Negative.   HENT: Negative.   Respiratory: Negative.   Cardiovascular:  Negative.   Gastrointestinal: Negative.   Musculoskeletal: Positive for joint pain.  Neurological: Negative.   Psychiatric/Behavioral: Negative.   All other systems reviewed and are negative.   PHYSICAL EXAM: VS:  BP 110/70 (BP Location: Left Arm, Patient Position: Sitting, Cuff Size: Large)   Pulse 83   Ht 5\' 11"  (1.803 m)   Wt (!) 314 lb (142.4 kg)   SpO2 98%   BMI 43.79 kg/m  , BMI Body mass index is 43.79 kg/m. Constitutional:  oriented to person, place, and time. No distress.  Obese HENT:  Head: Grossly normal Eyes:  no discharge. No scleral icterus.  Neck: No JVD, no carotid bruits  Cardiovascular: Irregularly irregular no murmurs appreciated Pulmonary/Chest: Clear to auscultation bilaterally, no wheezes or rails Abdominal: Soft.  no distension.  no tenderness.  Musculoskeletal: Normal range of motion Neurological:  normal muscle tone. Coordination normal. No atrophy Skin: Skin warm and dry Psychiatric: normal affect, pleasant  Recent Labs: 01/20/2020: Hemoglobin 17.9; Platelets 196 02/25/2020: ALT 24; BUN 25; Creatinine, Ser 0.92; Potassium 4.7; Sodium 136    Lipid Panel Lab Results  Component Value Date   CHOL 151 02/25/2020   HDL 44.60 02/25/2020   LDLCALC 77 02/25/2020   TRIG 147.0 02/25/2020      Wt Readings from Last 3 Encounters:  03/02/20 (!) 314 lb (142.4 kg)  02/25/20 (!) 309 lb 12.8 oz (140.5 kg)  01/23/20 (!) 309 lb 4.8 oz (140.3 kg)       ASSESSMENT AND PLAN:   Permanent atrial fibrillation (HCC) Rate controlled, on eliquis He is concerned about metoprolol causing weight  Essential hypertension Blood pressure is well controlled on today's visit. No changes made to the medications.  Pure hypercholesterolemia Cholesterol is at goal on the current lipid regimen. No changes to the medications were made.  Morbid obesity ([place]) We have encouraged continued exercise, careful diet management in an effort to lose weight.  Anxiety Needs  Xanax before procedures  Obstructive sleep apnea on CPAP  compliant on his CPAP  Chronic diastolic congestive heart failure (HCC) Lasix only as needed EF 50 to 55% Stable   Total encounter time more than 25 minutes  Greater than 50% was spent in counseling and coordination of care with the patient     Signed, Esmond Plants, M.D., Ph.D. 03/02/2020  [place], [place]

## 2020-03-02 ENCOUNTER — Ambulatory Visit (INDEPENDENT_AMBULATORY_CARE_PROVIDER_SITE_OTHER): Payer: Medicare HMO | Admitting: Cardiovascular Disease

## 2020-03-02 ENCOUNTER — Encounter: Payer: Self-pay | Admitting: Cardiovascular Disease

## 2020-03-02 ENCOUNTER — Other Ambulatory Visit: Payer: Self-pay

## 2020-03-02 VITALS — BP 110/70 | HR 83 | Ht 71.0 in | Wt 314.0 lb

## 2020-03-02 DIAGNOSIS — I10 Essential (primary) hypertension: Secondary | ICD-10-CM | POA: Diagnosis not present

## 2020-03-02 DIAGNOSIS — I429 Cardiomyopathy, unspecified: Secondary | ICD-10-CM

## 2020-03-02 DIAGNOSIS — I5032 Chronic diastolic (congestive) heart failure: Secondary | ICD-10-CM | POA: Diagnosis not present

## 2020-03-02 DIAGNOSIS — I4821 Permanent atrial fibrillation: Secondary | ICD-10-CM | POA: Diagnosis not present

## 2020-03-02 DIAGNOSIS — E782 Mixed hyperlipidemia: Secondary | ICD-10-CM

## 2020-03-02 NOTE — Patient Instructions (Addendum)
Medication Instructions:  No changes  If you need a refill on your cardiac medications before your next appointment, please call your pharmacy.    Lab work: No new labs needed   If you have labs (blood work) drawn today and your tests are completely normal, you will receive your results only by: Marland Kitchen MyChart Message (if you have MyChart) OR . A paper copy in the mail If you have any lab test that is abnormal or we need to change your treatment, we will call you to review the results.   Testing/Procedures: No new testing needed   Follow-Up: At [facility], you and your health needs are our priority.  As part of our continuing mission to provide you with exceptional heart care, we have created designated Provider Care Teams.  These Care Teams include your primary Cardiologist (physician) and Advanced Practice Providers (APPs -  Physician Assistants and Nurse Practitioners) who all work together to provide you with the care you need, when you need it.  . You will need a follow up appointment in 12 months  . Providers on your designated Care Team:   . Murray Hodgkins, NP . Christell Faith, PA-C . Marrianne Mood, PA-C  Any Other Special Instructions Will Be Listed Below (If Applicable).  COVID-19 Vaccine Information can be found at: ShippingScam.co.uk For questions related to vaccine distribution or appointments, please email vaccine@ .com or call [phone].    Medication Samples have been provided to the patient.  Drug name: Eliquis        Strength: 5 mg         Qty: 3 boxes   LOT: [id number]   Exp.Date: 12/2021

## 2020-03-03 ENCOUNTER — Other Ambulatory Visit: Payer: Self-pay | Admitting: Cardiovascular Disease

## 2020-03-09 ENCOUNTER — Other Ambulatory Visit: Payer: Self-pay | Admitting: Family Medicine

## 2020-03-12 ENCOUNTER — Encounter: Payer: Self-pay | Admitting: Family Medicine

## 2020-03-12 ENCOUNTER — Ambulatory Visit: Payer: Medicare HMO | Admitting: Internal Medicine

## 2020-03-13 ENCOUNTER — Encounter: Payer: Self-pay | Admitting: Family Medicine

## 2020-03-13 MED ORDER — TRAMADOL HCL 50 MG PO TABS
ORAL_TABLET | ORAL | 0 refills | Status: DC
Start: 2020-03-13 — End: 2020-04-15

## 2020-03-16 NOTE — Telephone Encounter (Signed)
Sent in last week

## 2020-03-20 ENCOUNTER — Other Ambulatory Visit: Payer: Self-pay

## 2020-03-20 MED ORDER — PRAVASTATIN SODIUM 80 MG PO TABS: 80.0000 mg | ORAL_TABLET | Freq: Every evening | ORAL | 3 refills | Status: DC

## 2020-03-23 ENCOUNTER — Encounter: Payer: Self-pay | Admitting: Family Medicine

## 2020-03-25 ENCOUNTER — Other Ambulatory Visit: Payer: Self-pay | Admitting: Cardiovascular Disease

## 2020-03-25 DIAGNOSIS — I4891 Unspecified atrial fibrillation: Secondary | ICD-10-CM

## 2020-03-25 NOTE — Telephone Encounter (Signed)
Pt's age 64, wt 142.4 kg, SCr 0.92, CrCl 165.53, last ov w/ TG 03/02/20.

## 2020-03-25 NOTE — Telephone Encounter (Signed)
Refill request

## 2020-03-27 ENCOUNTER — Encounter: Payer: Self-pay | Admitting: Family Medicine

## 2020-03-31 ENCOUNTER — Other Ambulatory Visit: Payer: Self-pay | Admitting: *Deleted

## 2020-03-31 MED ORDER — PRAVASTATIN SODIUM 80 MG PO TABS
80.0000 mg | ORAL_TABLET | Freq: Every evening | ORAL | 3 refills | Status: DC
Start: 2020-03-31 — End: 2021-05-03

## 2020-04-15 ENCOUNTER — Other Ambulatory Visit: Payer: Self-pay

## 2020-04-15 ENCOUNTER — Telehealth (INDEPENDENT_AMBULATORY_CARE_PROVIDER_SITE_OTHER): Payer: Medicare HMO | Admitting: Family Medicine

## 2020-04-15 ENCOUNTER — Encounter: Payer: Self-pay | Admitting: Family Medicine

## 2020-04-15 DIAGNOSIS — M79642 Pain in left hand: Secondary | ICD-10-CM

## 2020-04-15 DIAGNOSIS — G8929 Other chronic pain: Secondary | ICD-10-CM | POA: Insufficient documentation

## 2020-04-15 MED ORDER — TRAMADOL HCL 50 MG PO TABS
ORAL_TABLET | ORAL | 0 refills | Status: DC
Start: 2020-04-15 — End: 2020-05-26

## 2020-04-15 NOTE — Assessment & Plan Note (Signed)
Chronic issue.  Has also developed trigger finger.  He has an orthopedist who he can see for this.  I advised him to contact them to set up a visit.  He can continue tramadol on an as-needed basis as prescribed and Tylenol over-the-counter.  His topical menthol spray is also okay to use.  Discussed that he could sparingly use topical Voltaren as well as it does not get absorbed through the skin to any great degree.

## 2020-04-15 NOTE — Assessment & Plan Note (Signed)
Weight continues to trend down with diet and exercise.  He will continue to work on this.  Discussed weight loss will help with his knee pain and other joint issues.  We can revisit possible medication management in 3 months if needed.

## 2020-04-15 NOTE — Progress Notes (Signed)
Virtual Visit via video Note  This visit type was conducted due to national recommendations for restrictions regarding the COVID-19 pandemic (e.g. social distancing).  This format is felt to be most appropriate for this patient at this time.  All issues noted in this document were discussed and addressed.  No physical exam was performed (except for noted visual exam findings with Video Visits).   I connected with Carlos Crosby today at 10:30 AM EST by a video enabled telemedicine application and verified that I am speaking with the correct person using two identifiers. Location patient: home Location provider: work  Persons participating in the virtual visit: patient, provider  I discussed the limitations, risks, security and privacy concerns of performing an evaluation and management service by telephone and the availability of in person appointments. I also discussed with the patient that there may be a patient responsible charge related to this service. The patient expressed understanding and agreed to proceed.  Reason for visit: f/u  HPI: Obesity: Weight has trended down 9 pounds.  He is limiting his calories to 1700/day.  He is also swimming more.  Swimming 3 times per week.  Insurance would not cover wegovy for weight loss.  He is currently in the donut hole and wants to wait until the new year before exploring medication for weight loss.  Chronic left hand pain: Patient notes this has been bothering him more recently.  Bothers him more at night.  He broke his hand as a 64 year old and has had intermittent issues with this since then.  Also has seemed to develop trigger finger.  He takes tramadol and Tylenol for the discomfort.  Also uses a topical menthol spray.  Patient requests a refill of his tramadol as he will be due next week.   ROS: See pertinent positives and negatives per HPI.  Past Medical History:  Diagnosis Date  . Chronic a-fib ([place])   . Chronic systolic CHF (congestive  heart failure) ([place])   . Depression   . Hemochromatosis   . History of kidney stones   . History of pulmonary embolism   . Hyperlipidemia   . Hypertension   . Migraine   . Obesity   . OSA (obstructive sleep apnea)    on CPAP  . Osteoarthritis    bilateral knees  . Rocky Mountain spotted fever 2011    Past Surgical History:  Procedure Laterality Date  . CARDIAC CATHETERIZATION  2012   [facility]  . KNEE SURGERY     right knee   . LITHOTRIPSY  07/31/2014  . URETERAL STENT PLACEMENT  2016  . URETEROSCOPY      Family History  Problem Relation Age of Onset  . Thyroid disease Mother   . Cancer Father        lung  . Arthritis Father   . Hypertension Father   . Cancer Paternal Grandfather        Throat cancer    SOCIAL HX: Non-smoker   Current Outpatient Medications:  .  acetaminophen (TYLENOL) 500 MG tablet, Take 1,000 mg by mouth every 6 (six) hours as needed., Disp: , Rfl:  .  Cholecalciferol (VITAMIN D) 2000 units CAPS, daily. , Disp: , Rfl:  .  digoxin (LANOXIN) 0.125 MG tablet, TAKE 1 TABLET SIX DAYS A WEEK AS DIRECTED, Disp: 78 tablet, Rfl: 2 .  ELIQUIS 5 MG TABS tablet, TAKE 1 TABLET TWICE DAILY, Disp: 180 tablet, Rfl: 1 .  furosemide (LASIX) 40 MG tablet, Take 40 mg by  mouth daily as needed. , Disp: , Rfl:  .  lisinopril (ZESTRIL) 2.5 MG tablet, TAKE 1 TABLET EVERY DAY, Disp: 90 tablet, Rfl: 3 .  loratadine (CLARITIN) 10 MG tablet, Take 10 mg by mouth daily., Disp: , Rfl:  .  metoprolol succinate (TOPROL-XL) 100 MG 24 hr tablet, Take 2 tablets every morning and 1 tablet every evening qd., Disp: 270 tablet, Rfl: 3 .  Multiple Vitamins-Minerals (CENTRUM SILVER ADULT 50+) TABS, Take 1 tablet by mouth daily., Disp: , Rfl:  .  Potassium 95 MG TABS, Take 1 tablet by mouth., Disp: , Rfl:  .  pravastatin (PRAVACHOL) 80 MG tablet, Take 1 tablet (80 mg total) by mouth every evening., Disp: 90 tablet, Rfl: 3 .  traMADol (ULTRAM) 50 MG tablet, TAKE 1 TABLET(50 MG) BY MOUTH EVERY 6  HOURS AS NEEDED FOR PAIN, Disp: 90 tablet, Rfl: 0 .  verapamil (CALAN-SR) 120 MG CR tablet, TAKE 1 TABLET EVERY DAY, Disp: 90 tablet, Rfl: 2  EXAM:  VITALS per patient if applicable:  GENERAL: alert, oriented, appears well and in no acute distress  HEENT: atraumatic, conjunttiva clear, no obvious abnormalities on inspection of external nose and ears  NECK: normal movements of the head and neck  LUNGS: on inspection no signs of respiratory distress, breathing rate appears normal, no obvious gross SOB, gasping or wheezing  CV: no obvious cyanosis  MS: moves all visible extremities without noticeable abnormality  PSYCH/NEURO: pleasant and cooperative, no obvious depression or anxiety, speech and thought processing grossly intact  ASSESSMENT AND PLAN:  Discussed the following assessment and plan:  Problem List Items Addressed This Visit    Chronic pain of left hand    Chronic issue.  Has also developed trigger finger.  He has an orthopedist who he can see for this.  I advised him to contact them to set up a visit.  He can continue tramadol on an as-needed basis as prescribed and Tylenol over-the-counter.  His topical menthol spray is also okay to use.  Discussed that he could sparingly use topical Voltaren as well as it does not get absorbed through the skin to any great degree.      Relevant Medications   traMADol (ULTRAM) 50 MG tablet   Morbid obesity (HCC)    Weight continues to trend down with diet and exercise.  He will continue to work on this.  Discussed weight loss will help with his knee pain and other joint issues.  We can revisit possible medication management in 3 months if needed.          I discussed the assessment and treatment plan with the patient. The patient was provided an opportunity to ask questions and all were answered. The patient agreed with the plan and demonstrated an understanding of the instructions.   The patient was advised to call back or seek an  in-person evaluation if the symptoms worsen or if the condition fails to improve as anticipated.    [name], MD

## 2020-04-24 ENCOUNTER — Other Ambulatory Visit: Payer: Self-pay

## 2020-04-24 ENCOUNTER — Inpatient Hospital Stay: Payer: Medicare HMO

## 2020-04-24 ENCOUNTER — Inpatient Hospital Stay: Payer: Medicare HMO | Attending: Oncology

## 2020-04-24 LAB — CBC WITH DIFFERENTIAL/PLATELET
Abs Immature Granulocytes: 0.02 10*3/uL (ref 0.00–0.07)
Basophils Absolute: 0.1 10*3/uL (ref 0.0–0.1)
Basophils Relative: 1 %
Eosinophils Absolute: 0.4 10*3/uL (ref 0.0–0.5)
Eosinophils Relative: 5 %
HCT: 48.3 % (ref 39.0–52.0)
Hemoglobin: 17.4 g/dL — ABNORMAL HIGH (ref 13.0–17.0)
Immature Granulocytes: 0 %
Lymphocytes Relative: 33 %
Lymphs Abs: 2.7 10*3/uL (ref 0.7–4.0)
MCH: 34.5 pg — ABNORMAL HIGH (ref 26.0–34.0)
MCHC: 36 g/dL (ref 30.0–36.0)
MCV: 95.6 fL (ref 80.0–100.0)
Monocytes Absolute: 0.7 10*3/uL (ref 0.1–1.0)
Monocytes Relative: 9 %
Neutro Abs: 4.2 10*3/uL (ref 1.7–7.7)
Neutrophils Relative %: 52 %
Platelets: 182 10*3/uL (ref 150–400)
RBC: 5.05 MIL/uL (ref 4.22–5.81)
RDW: 12.3 % (ref 11.5–15.5)
WBC: 8.1 10*3/uL (ref 4.0–10.5)
nRBC: 0 % (ref 0.0–0.2)

## 2020-04-24 LAB — FERRITIN: Ferritin: 139 ng/mL (ref 24–336)

## 2020-04-24 LAB — IRON AND TIBC
Iron: 115 ug/dL (ref 45–182)
Saturation Ratios: 31 % (ref 17.9–39.5)
TIBC: 370 ug/dL (ref 250–450)
UIBC: 255 ug/dL

## 2020-04-24 NOTE — Progress Notes (Signed)
Pt here for phleb. H/H : 17.7 and 48.3. ferritin not resulted. Per MD- ok to proceed with phleb. Pt completed without incident.

## 2020-04-27 ENCOUNTER — Encounter: Payer: Self-pay | Admitting: Oncology

## 2020-05-07 ENCOUNTER — Ambulatory Visit: Payer: Medicare HMO | Admitting: Orthopaedic Surgery

## 2020-05-07 ENCOUNTER — Other Ambulatory Visit: Payer: Self-pay

## 2020-05-07 ENCOUNTER — Encounter: Payer: Self-pay | Admitting: Orthopaedic Surgery

## 2020-05-07 VITALS — Ht 71.0 in | Wt 305.0 lb

## 2020-05-07 DIAGNOSIS — M65342 Trigger finger, left ring finger: Secondary | ICD-10-CM

## 2020-05-07 DIAGNOSIS — M170 Bilateral primary osteoarthritis of knee: Secondary | ICD-10-CM | POA: Diagnosis not present

## 2020-05-07 MED ORDER — LIDOCAINE HCL 1 % IJ SOLN
2.0000 mL | INTRAMUSCULAR | Status: AC | PRN
  Administered 2020-05-07: 2 mL

## 2020-05-07 MED ORDER — METHYLPREDNISOLONE ACETATE 40 MG/ML IJ SUSP
80.0000 mg | INTRAMUSCULAR | Status: AC | PRN
  Administered 2020-05-07: 80 mg via INTRA_ARTICULAR

## 2020-05-07 MED ORDER — BUPIVACAINE HCL 0.5 % IJ SOLN
2.0000 mL | INTRAMUSCULAR | Status: AC | PRN
  Administered 2020-05-07: 2 mL via INTRA_ARTICULAR

## 2020-05-07 NOTE — Progress Notes (Signed)
Office Visit Note   Patient: Carlos Crosby           Date of Birth: 1955-08-18           MRN: [id number] Visit Date: 05/07/2020              Requested by: Leone Haven, MD [address] [place],  [place] [address] PCP: Leone Haven, MD   Assessment & Plan: Visit Diagnoses:  1. Primary osteoarthritis of both knees   2. Trigger finger, left ring finger   3. Morbid obesity ([place])     Plan: Mr. Whitmoyer has advanced osteoarthritis both knees and has had prior cortisone and viscosupplementation.  He is having recurrent pain in both of his knees with small effusions and would like to have cortisone injections.  We will perform this today.  In addition he is developed triggering of the left ring finger with a painful nodule on the palmar aspect of the metacarpal phalangeal joint.  He is having trouble mostly at night.  Will apply a splint at night and plan to see him back as needed.  I have also discussed other treatment options like Voltaren gel and even surgery.  He has a history of A. fib and has to be careful with any NSAIDs  Follow-Up Instructions: Return if symptoms worsen or fail to improve.   Orders:  Orders Placed This Encounter  Procedures  . Large Joint Inj: bilateral knee   No orders of the defined types were placed in this encounter.     Procedures: Large Joint Inj: bilateral knee on 05/07/2020 1:53 PM Indications: diagnostic evaluation Details: 25 G 1.5 in needle, anteromedial approach  Arthrogram: No  Medications (Right): 2 mL lidocaine 1 %; 2 mL bupivacaine 0.5 %; 80 mg methylPREDNISolone acetate 40 MG/ML Medications (Left): 2 mL lidocaine 1 %; 2 mL bupivacaine 0.5 %; 80 mg methylPREDNISolone acetate 40 MG/ML Consent was given by the patient. Immediately prior to procedure a time out was called to verify the correct patient, procedure, equipment, support staff and site/side marked as required. Patient was prepped and draped in the usual sterile  fashion.       Clinical Data: No additional findings.   Subjective: Chief Complaint  Patient presents with  . Left Knee - Follow-up  . Right Knee - Follow-up  Patient presents today for chronic bilateral knee pain. He was last here in July and received bilateral knee injections. He states that the injections were helpful, but started to hurt again about 3 weeks ago. He is wanting more injections today.  He also wants to have his left ring finger examined. He said that it has been getting stuck in the flexed position for about a month. This is painful when it gets stuck. He states that his finger has hurt for many years, since fracturing his hand 50years ago. His finger is worse at night. He is not diabetic.   HPI  Review of Systems   Objective: Vital Signs: Ht 5\' 11"  (1.803 m)   Wt (!) 305 lb (138.3 kg)   BMI 42.54 kg/m   Physical Exam Constitutional:      Appearance: He is well-developed.  Eyes:     Pupils: Pupils are equal, round, and reactive to light.  Pulmonary:     Effort: Pulmonary effort is normal.  Skin:    General: Skin is warm and dry.  Neurological:     Mental Status: He is alert and oriented to person,  place, and time.  Psychiatric:        Behavior: Behavior normal.     Ortho Exam awake alert and oriented x3.  Comfortable sitting.  Increased varus both of his knees with small effusions and predominantly medial joint pain.  Does lack a few degrees to full extension both knees but flexed over 100 degrees without instability. Left hand with a painful nodule on the palmar aspect of the ring finger at the level of the metacarpal phalangeal joint.  He could not actively trigger the finger.  No swelling.  Neurologically intact   Specialty Comments:  No specialty comments available.  Imaging: No results found.   PMFS History: Patient Active Problem List   Diagnosis Date Noted  . Trigger finger, left ring finger 05/07/2020  . Chronic pain of left hand  04/15/2020  . Routine general medical examination at a health care facility 02/25/2020  . Rectal pain 10/18/2019  . Rash 10/08/2019  . Fall 02/05/2019  . Neuropathy 11/16/2017  . Prediabetes 11/16/2017  . Vitamin D deficiency 11/16/2017  . 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 Bleeding nose 05/18/2017  . Allergic rhinitis 01/26/2017  . Blue nevus 05/16/2016  . Hair loss 05/16/2016  . Abdominal pain 05/16/2016  . Umbilical hernia without obstruction and without gangrene 05/16/2016  . Atrial fibrillation ([place]) 09/26/2014  . Morbid obesity ([place]) 09/26/2014  . Chronic diastolic congestive heart failure ([place]) 09/26/2014  . Anxiety 09/26/2014  . Obstructive sleep apnea on CPAP 09/26/2014  . Essential hypertension 09/26/2014  . Hyperlipidemia 09/26/2014  . Osteoarthritis of both knees 09/26/2014  . Kidney stone 09/26/2014   Past Medical History:  Diagnosis Date  . Chronic a-fib ([place])   . Chronic systolic CHF (congestive heart failure) ([place])   . Depression   . Hemochromatosis   . History of kidney stones   . History of pulmonary embolism   . Hyperlipidemia   . Hypertension   . Migraine   . Obesity   . OSA (obstructive sleep apnea)    on CPAP  . Osteoarthritis    bilateral knees  . Rocky Mountain spotted fever 2011    Family History  Problem Relation Age of Onset  . Thyroid disease Mother   . Cancer Father        lung  . Arthritis Father   . Hypertension Father   . Cancer Paternal Grandfather        Throat cancer    Past Surgical History:  Procedure Laterality Date  . CARDIAC CATHETERIZATION  2012   [facility]  . KNEE SURGERY     right knee   . LITHOTRIPSY  07/31/2014  . URETERAL STENT PLACEMENT  2016  . URETEROSCOPY     Social History   Occupational History  . Not on file  Tobacco Use  . Smoking status: Never Smoker  . Smokeless tobacco: Never Used  Vaping Use  . Vaping Use: Never used  Substance and Sexual Activity  . Alcohol use: No    Comment:  occasional; 3 times a year  . Drug use: No  . Sexual activity: Not Currently

## 2020-05-24 ENCOUNTER — Other Ambulatory Visit: Payer: Self-pay | Admitting: Family Medicine

## 2020-05-28 ENCOUNTER — Encounter: Payer: Self-pay | Admitting: Family Medicine

## 2020-06-03 DIAGNOSIS — G4733 Obstructive sleep apnea (adult) (pediatric): Secondary | ICD-10-CM | POA: Diagnosis not present

## 2020-06-03 DIAGNOSIS — G4730 Sleep apnea, unspecified: Secondary | ICD-10-CM | POA: Diagnosis not present

## 2020-06-06 ENCOUNTER — Other Ambulatory Visit: Payer: Self-pay | Admitting: Internal Medicine

## 2020-06-15 ENCOUNTER — Encounter: Payer: Self-pay | Admitting: Orthopaedic Surgery

## 2020-06-15 ENCOUNTER — Telehealth: Payer: Self-pay

## 2020-06-15 ENCOUNTER — Encounter: Payer: Self-pay | Admitting: Family Medicine

## 2020-06-15 DIAGNOSIS — G4733 Obstructive sleep apnea (adult) (pediatric): Secondary | ICD-10-CM

## 2020-06-15 DIAGNOSIS — Z9989 Dependence on other enabling machines and devices: Secondary | ICD-10-CM

## 2020-06-15 NOTE — Telephone Encounter (Signed)
Noted  

## 2020-06-15 NOTE — Telephone Encounter (Signed)
Please precert for bilateral knee gel injections. 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 It has now been 6 months since his last Orthovisc injections.  Thanks!

## 2020-06-24 ENCOUNTER — Other Ambulatory Visit: Payer: Self-pay | Admitting: Cardiovascular Disease

## 2020-06-24 NOTE — Telephone Encounter (Signed)
Rx request sent to pharmacy.  

## 2020-06-29 ENCOUNTER — Telehealth: Payer: Self-pay | Admitting: Cardiovascular Disease

## 2020-06-29 NOTE — Telephone Encounter (Signed)
Prior Authorization started for Metoprolol Succinate ER 100 mg, 2 tab in the morning and 1 tab in the evening.   Loney Loh [place]: [facility] - PA Case ID: [id number] - Rx #: [id number]  Waiting for response from CoverMyMeds

## 2020-06-30 ENCOUNTER — Other Ambulatory Visit: Payer: Self-pay | Admitting: Family Medicine

## 2020-06-30 NOTE — Telephone Encounter (Signed)
PA approved through 06/05/21.   Loney Loh Key: [facility] - PA Case ID: [id number] - Rx #: [id number]  Status: PA Response - Favorable  Drug: Metoprolol Succinate ER 100MG  er tablets  Form: [employer] Electronic PA Form  Authorization Expiration Date: 2021-06-05

## 2020-06-30 NOTE — Telephone Encounter (Signed)
Required questions answered.  Loney Loh Key: [facility] - PA Case ID: [id number] - Rx #: [id number] Need help? Call us at [phone] Status: Sent to [employer] Drug: Metoprolol Succinate ER 100MG  er tablets Form: Administrator, sports PA Form

## 2020-07-01 ENCOUNTER — Encounter: Payer: Self-pay | Admitting: Orthopaedic Surgery

## 2020-07-01 NOTE — Telephone Encounter (Signed)
Yes ma'am! 

## 2020-07-03 ENCOUNTER — Telehealth: Payer: Self-pay

## 2020-07-03 NOTE — Telephone Encounter (Signed)
Submitted VOB for Orthovisc, bilateral knee.  

## 2020-07-07 ENCOUNTER — Telehealth: Payer: Self-pay

## 2020-07-07 NOTE — Telephone Encounter (Signed)
PA required for Orthovisc, bilateral knee. Submitted PA online through [employer] Pending PA# [id number]

## 2020-07-08 ENCOUNTER — Telehealth: Payer: Self-pay

## 2020-07-08 NOTE — Telephone Encounter (Signed)
PA still pending due to [employer] requiring more information for Orthovisc, bilateral knee. Information has been submitted online to [employer].

## 2020-07-09 ENCOUNTER — Telehealth: Payer: Self-pay

## 2020-07-09 NOTE — Telephone Encounter (Signed)
Approved for Orthovisc series, bilateral knee. [place] Patient will be responsible for 20% OOP. Co-pay of $20.00 per each visit PA required PA Approval# [id number] Valid 07/07/2020-01/04/2021  Appt.07/14/2020 with Dr. Durward Fortes

## 2020-07-14 ENCOUNTER — Encounter: Payer: Self-pay | Admitting: Family Medicine

## 2020-07-14 ENCOUNTER — Other Ambulatory Visit: Payer: Self-pay

## 2020-07-14 ENCOUNTER — Encounter: Payer: Self-pay | Admitting: Orthopaedic Surgery

## 2020-07-14 ENCOUNTER — Ambulatory Visit: Payer: Medicare HMO | Admitting: Orthopaedic Surgery

## 2020-07-14 VITALS — Ht 71.0 in | Wt 305.0 lb

## 2020-07-14 DIAGNOSIS — M170 Bilateral primary osteoarthritis of knee: Secondary | ICD-10-CM

## 2020-07-14 DIAGNOSIS — Z1211 Encounter for screening for malignant neoplasm of colon: Secondary | ICD-10-CM

## 2020-07-14 MED ORDER — HYALURONAN 30 MG/2ML IX SOSY
30.0000 mg | PREFILLED_SYRINGE | INTRA_ARTICULAR | Status: AC | PRN
  Administered 2020-07-14: 30 mg via INTRA_ARTICULAR

## 2020-07-14 NOTE — Progress Notes (Signed)
Office Visit Note   Patient: Carlos Crosby           Date of Birth: Jul 17, 1955           MRN: [id number] Visit Date: 07/14/2020              Requested by: Leone Haven, MD [address] [place],  [place] [address] PCP: Leone Haven, MD   Assessment & Plan: Visit Diagnoses:  1. Primary osteoarthritis of both knees     Plan: First Orthovisc injection both knees.  Return weekly for the next 2 weeks to complete the series  Follow-Up Instructions: Return in about 1 week (around 07/21/2020).   Orders:  Orders Placed This Encounter  Procedures  . Large Joint Inj: bilateral knee   No orders of the defined types were placed in this encounter.     Procedures: Large Joint Inj: bilateral knee on 07/14/2020 3:26 PM Indications: pain and joint swelling Details: 25 G 1.5 in needle, anteromedial approach  Arthrogram: No  Medications (Right): 30 mg Hyaluronan 30 MG/2ML Medications (Left): 30 mg Hyaluronan 30 MG/2ML Outcome: tolerated well, no immediate complications Procedure, treatment alternatives, risks and benefits explained, specific risks discussed. Consent was given by the patient. Immediately prior to procedure a time out was called to verify the correct patient, procedure, equipment, support staff and site/side marked as required. Patient was prepped and draped in the usual sterile fashion.       Clinical Data: No additional findings.   Subjective: Chief Complaint  Patient presents with  . Right Knee - Follow-up    Orthovisc started 07/14/2020  . Left Knee - Follow-up    Orthovisc started 07/14/2020  Patient presents today for the first Orthovisc injections bilaterally.   HPI  Review of Systems   Objective: Vital Signs: Ht 5\' 11"  (1.803 m)   Wt (!) 305 lb (138.3 kg)   BMI 42.54 kg/m   Physical Exam  Ortho Exam increased varus both knees but no change from prior exams.  No obvious effusion.  Neck neither knee was hot warm or  red  Specialty Comments:  No specialty comments available.  Imaging: No results found.   PMFS History: Patient Active Problem List   Diagnosis Date Noted  . Trigger finger, left ring finger 05/07/2020  . Chronic pain of left hand 04/15/2020  . Routine general medical examination at a health care facility 02/25/2020  . Rectal pain 10/18/2019  . Rash 10/08/2019  . Fall 02/05/2019  . Neuropathy 11/16/2017  . Prediabetes 11/16/2017  . Vitamin D deficiency 11/16/2017  . 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 Bleeding nose 05/18/2017  . Allergic rhinitis 01/26/2017  . Blue nevus 05/16/2016  . Hair loss 05/16/2016  . Abdominal pain 05/16/2016  . Umbilical hernia without obstruction and without gangrene 05/16/2016  . Atrial fibrillation ([place]) 09/26/2014  . Morbid obesity ([place]) 09/26/2014  . Chronic diastolic congestive heart failure ([place]) 09/26/2014  . Anxiety 09/26/2014  . Obstructive sleep apnea on CPAP 09/26/2014  . Essential hypertension 09/26/2014  . Hyperlipidemia 09/26/2014  . Osteoarthritis of both knees 09/26/2014  . Kidney stone 09/26/2014   Past Medical History:  Diagnosis Date  . Chronic a-fib ([place])   . Chronic systolic CHF (congestive heart failure) ([place])   . Depression   . Hemochromatosis   . History of kidney stones   . History of pulmonary embolism   . Hyperlipidemia   . Hypertension   . Migraine   . Obesity   .  OSA (obstructive sleep apnea)    on CPAP  . Osteoarthritis    bilateral knees  . Rocky Mountain spotted fever 2011    Family History  Problem Relation Age of Onset  . Thyroid disease Mother   . Cancer Father        lung  . Arthritis Father   . Hypertension Father   . Cancer Paternal Grandfather        Throat cancer    Past Surgical History:  Procedure Laterality Date  . CARDIAC CATHETERIZATION  2012   [facility]  . KNEE SURGERY     right knee   . LITHOTRIPSY  07/31/2014  . URETERAL STENT PLACEMENT  2016  . URETEROSCOPY      Social History   Occupational History  . Not on file  Tobacco Use  . Smoking status: Never Smoker  . Smokeless tobacco: Never Used  Vaping Use  . Vaping Use: Never used  Substance and Sexual Activity  . Alcohol use: No    Comment: occasional; 3 times a year  . Drug use: No  . Sexual activity: Not Currently

## 2020-07-21 ENCOUNTER — Ambulatory Visit: Payer: Medicare HMO | Admitting: Orthopaedic Surgery

## 2020-07-21 ENCOUNTER — Ambulatory Visit: Payer: Medicare HMO | Admitting: Family Medicine

## 2020-07-21 ENCOUNTER — Encounter: Payer: Self-pay | Admitting: Family Medicine

## 2020-07-21 ENCOUNTER — Other Ambulatory Visit: Payer: Self-pay

## 2020-07-21 DIAGNOSIS — M1711 Unilateral primary osteoarthritis, right knee: Secondary | ICD-10-CM

## 2020-07-21 DIAGNOSIS — M1712 Unilateral primary osteoarthritis, left knee: Secondary | ICD-10-CM

## 2020-07-21 NOTE — Progress Notes (Signed)
S:  Here for bilateral orthovisc.  No problems so far.  O:  Trace effusion in each knee, no warmth.  Procedure:  After sterile prep with betadine, injected 3 cc 0.25% bupivacaine and orthovisc from superolateral approach into each knee.  A flash of clear yellow synovial fluid was obtained prior to each injection.  Return in 1 week.

## 2020-07-28 ENCOUNTER — Encounter: Payer: Self-pay | Admitting: Orthopaedic Surgery

## 2020-07-28 ENCOUNTER — Other Ambulatory Visit: Payer: Self-pay

## 2020-07-28 ENCOUNTER — Ambulatory Visit: Payer: Medicare HMO | Admitting: Orthopaedic Surgery

## 2020-07-28 VITALS — Ht 71.0 in | Wt 305.0 lb

## 2020-07-28 DIAGNOSIS — M170 Bilateral primary osteoarthritis of knee: Secondary | ICD-10-CM | POA: Diagnosis not present

## 2020-07-28 MED ORDER — HYALURONAN 30 MG/2ML IX SOSY
30.0000 mg | PREFILLED_SYRINGE | INTRA_ARTICULAR | Status: AC | PRN
  Administered 2020-07-28: 30 mg via INTRA_ARTICULAR

## 2020-07-28 MED ORDER — HYALURONAN 30 MG/2ML IX SOSY
30.0000 mg | PREFILLED_SYRINGE | INTRA_ARTICULAR | Status: AC | PRN
Start: 2020-07-28 — End: 2020-07-28
  Administered 2020-07-28: 30 mg via INTRA_ARTICULAR

## 2020-07-28 NOTE — Progress Notes (Signed)
Office Visit Note   Patient: SHONTA PHILLIS           Date of Birth: 1956-05-31           MRN: [id number] Visit Date: 07/28/2020              Requested by: Leone Haven, MD [address] [place],  [place] [address] PCP: Leone Haven, MD   Assessment & Plan: Visit Diagnoses:  1. Primary osteoarthritis of both knees     Plan: Third and final Orthovisc injection both knees.  Mr. Noecker feels like it is made a difference.  We will plan to see him back as needed.  Continue to work on his weight with a BMI of about 43  Follow-Up Instructions: Return if symptoms worsen or fail to improve.   Orders:  Orders Placed This Encounter  Procedures  . Large Joint Inj: bilateral knee   No orders of the defined types were placed in this encounter.     Procedures: Large Joint Inj: bilateral knee on 07/28/2020 1:48 PM Indications: pain and joint swelling Details: 25 G 1.5 in needle, anteromedial approach  Arthrogram: No  Medications (Right): 30 mg Hyaluronan 30 MG/2ML Medications (Left): 30 mg Hyaluronan 30 MG/2ML Outcome: tolerated well, no immediate complications Procedure, treatment alternatives, risks and benefits explained, specific risks discussed. Consent was given by the patient. Immediately prior to procedure a time out was called to verify the correct patient, procedure, equipment, support staff and site/side marked as required. Patient was prepped and draped in the usual sterile fashion.       Clinical Data: No additional findings.   Subjective: Chief Complaint  Patient presents with  . Right Knee - Follow-up    Bilateral Orthovisc started 07/14/2020  . Left Knee - Follow-up  Patient presents today for the final set of Orthovisc injections bilaterally. He started the series on 07/14/2020.   HPI  Review of Systems   Objective: Vital Signs: Ht 5\' 11"  (1.803 m)   Wt (!) 305 lb (138.3 kg)   BMI 42.54 kg/m   Physical Exam  Ortho Exam no  evidence of increased heat or warmth either knee.  Possibly very minimal effusion but no significant pain.  Specialty Comments:  No specialty comments available.  Imaging: No results found.   PMFS History: Patient Active Problem List   Diagnosis Date Noted  . Trigger finger, left ring finger 05/07/2020  . Chronic pain of left hand 04/15/2020  . Routine general medical examination at a health care facility 02/25/2020  . Rectal pain 10/18/2019  . Rash 10/08/2019  . Fall 02/05/2019  . Neuropathy 11/16/2017  . Prediabetes 11/16/2017  . Vitamin D deficiency 11/16/2017  . 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 Bleeding nose 05/18/2017  . Allergic rhinitis 01/26/2017  . Blue nevus 05/16/2016  . Hair loss 05/16/2016  . Abdominal pain 05/16/2016  . Umbilical hernia without obstruction and without gangrene 05/16/2016  . Atrial fibrillation ([place]) 09/26/2014  . Morbid obesity ([place]) 09/26/2014  . Chronic diastolic congestive heart failure ([place]) 09/26/2014  . Anxiety 09/26/2014  . Obstructive sleep apnea on CPAP 09/26/2014  . Essential hypertension 09/26/2014  . Hyperlipidemia 09/26/2014  . Osteoarthritis of both knees 09/26/2014  . Kidney stone 09/26/2014   Past Medical History:  Diagnosis Date  . Chronic a-fib ([place])   . Chronic systolic CHF (congestive heart failure) ([place])   . Depression   . Hemochromatosis   . History of kidney stones   .  History of pulmonary embolism   . Hyperlipidemia   . Hypertension   . Migraine   . Obesity   . OSA (obstructive sleep apnea)    on CPAP  . Osteoarthritis    bilateral knees  . Rocky Mountain spotted fever 2011    Family History  Problem Relation Age of Onset  . Thyroid disease Mother   . Cancer Father        lung  . Arthritis Father   . Hypertension Father   . Cancer Paternal Grandfather        Throat cancer    Past Surgical History:  Procedure Laterality Date  . CARDIAC CATHETERIZATION  2012   [facility]  . KNEE SURGERY      right knee   . LITHOTRIPSY  07/31/2014  . URETERAL STENT PLACEMENT  2016  . URETEROSCOPY     Social History   Occupational History  . Not on file  Tobacco Use  . Smoking status: Never Smoker  . Smokeless tobacco: Never Used  Vaping Use  . Vaping Use: Never used  Substance and Sexual Activity  . Alcohol use: No    Comment: occasional; 3 times a year  . Drug use: No  . Sexual activity: Not Currently

## 2020-08-01 NOTE — Progress Notes (Signed)
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  INTERVAL HISTORY: Patient returns to clinic today for repeat laboratory work, further evaluation, and consideration of additional phlebotomy.  He has gained the weight back that he lost recently, but otherwise feels well and is asymptomatic.  He has no neurologic complaints.  He denies any recent fevers or illnesses.  He denies any chest pain, shortness of breath, cough, or hemoptysis.  He denies any nausea, vomiting, constipation, or diarrhea.  He has no urinary complaints.  Patient offers no further specific complaints today.  REVIEW OF SYSTEMS:   Review of Systems  Constitutional: Negative.  Negative for fever, malaise/fatigue and weight loss.  Respiratory: Negative.  Negative for cough, hemoptysis and shortness of breath.   Cardiovascular: Negative.  Negative for chest pain and leg swelling.  Gastrointestinal: Negative.  Negative for abdominal pain, blood in stool and melena.  Genitourinary: Negative.  Negative for hematuria.  Musculoskeletal: Negative.  Negative for back pain.  Skin: Negative.  Negative for rash.  Neurological: Negative.  Negative for sensory change, focal weakness, weakness and headaches.  Psychiatric/Behavioral: Negative.  The patient is not nervous/anxious.     As per HPI. Otherwise, a complete review of systems is negative.  PAST MEDICAL HISTORY: Past Medical History:  Diagnosis Date  . Chronic a-fib ([place])   . Chronic systolic CHF (congestive heart failure) ([place])   . Depression   . Hemochromatosis   . History of kidney stones   . History of pulmonary embolism   . Hyperlipidemia   .  Hypertension   . Migraine   . Obesity   . OSA (obstructive sleep apnea)    on CPAP  . Osteoarthritis    bilateral knees  . Rocky Mountain spotted fever 2011    PAST SURGICAL HISTORY: Past Surgical History:  Procedure Laterality Date  . CARDIAC CATHETERIZATION  2012   [facility]  . KNEE SURGERY     right knee   . LITHOTRIPSY  07/31/2014  . URETERAL STENT PLACEMENT  2016  . URETEROSCOPY      FAMILY HISTORY: Family History  Problem Relation Age of Onset  . Thyroid disease Mother   . Cancer Father        lung  . Arthritis Father   . Hypertension Father   . Cancer Paternal Grandfather        Throat cancer    ADVANCED DIRECTIVES (Y/N):  N  HEALTH MAINTENANCE: Social History   Tobacco Use  . Smoking status: Never Smoker  . Smokeless tobacco: Never Used  Vaping Use  . Vaping Use: Never used  Substance Use Topics  . Alcohol use: No    Comment: occasional; 3 times a year  . Drug use: No     Colonoscopy:  PAP:  Bone density:  Lipid panel:  Allergies  Allergen Reactions  . Allopurinol Hives and Swelling  . Diltiazem Itching and Rash  . Ondansetron Rash  . Penicillins Other (See Comments) and Nausea Only    Pt unsure of rxn; showed up on allergy test Pt unsure of rxn; showed up on allergy test Pt unsure of rxn; showed up on allergy test And dirrhea     Current Outpatient Medications  Medication Sig Dispense  Refill  . acetaminophen (TYLENOL) 500 MG tablet Take 1,000 mg by mouth every 6 (six) hours as needed.    . Cholecalciferol (VITAMIN D) 2000 units CAPS daily.     . digoxin (LANOXIN) 0.125 MG tablet TAKE 1 TABLET SIX DAYS A WEEK AS DIRECTED 78 tablet 2  . ELIQUIS 5 MG TABS tablet TAKE 1 TABLET TWICE DAILY 180 tablet 1  . furosemide (LASIX) 40 MG tablet Take 40 mg by mouth daily as needed.     Marland Kitchen lisinopril (ZESTRIL) 2.5 MG tablet TAKE 1 TABLET EVERY DAY 90 tablet 3  . loratadine (CLARITIN) 10 MG tablet Take 10 mg by mouth daily.    . metoprolol succinate  (TOPROL-XL) 100 MG 24 hr tablet TAKE 2 TABLETS EVERY MORNING  AND TAKE 1 TABLET EVERY EVENING 270 tablet 3  . Multiple Vitamins-Minerals (CENTRUM SILVER ADULT 50+) TABS Take 1 tablet by mouth daily.    . Potassium 95 MG TABS Take 1 tablet by mouth.    . pravastatin (PRAVACHOL) 80 MG tablet Take 1 tablet (80 mg total) by mouth every evening. 90 tablet 3  . traMADol (ULTRAM) 50 MG tablet TAKE 1 TABLET(50 MG) BY MOUTH EVERY 6 HOURS AS NEEDED FOR PAIN 90 tablet 0  . verapamil (CALAN-SR) 120 MG CR tablet TAKE 1 TABLET EVERY DAY 90 tablet 2   No current facility-administered medications for this visit.    OBJECTIVE: Vitals:   08/06/20 1438  BP: 118/86  Pulse: 94  Resp: 18  Temp: 97.7 F (36.5 C)     Body mass index is 45.43 kg/m.    ECOG FS:0 - Asymptomatic  General: Well-developed, well-nourished, no acute distress. Eyes: Pink conjunctiva, anicteric sclera. HEENT: Normocephalic, moist mucous membranes. Lungs: No audible wheezing or coughing. Heart: Regular rate and rhythm. Abdomen: Soft, nontender, no obvious distention. Musculoskeletal: No edema, cyanosis, or clubbing. Neuro: Alert, answering all questions appropriately. Cranial nerves grossly intact. Skin: No rashes or petechiae noted. Psych: Normal affect.  LAB RESULTS:  Lab Results  Component Value Date   NA 136 02/25/2020   K 4.7 02/25/2020   CL 101 02/25/2020   CO2 26 02/25/2020   GLUCOSE 95 02/25/2020   BUN 25 (H) 02/25/2020   CREATININE 0.92 02/25/2020   CALCIUM 9.2 02/25/2020   PROT 7.0 02/25/2020   ALBUMIN 4.5 02/25/2020   AST 20 02/25/2020   ALT 24 02/25/2020   ALKPHOS 42 02/25/2020   BILITOT 1.0 02/25/2020   GFRNONAA 92 09/05/2019   GFRAA 107 09/05/2019    Lab Results  Component Value Date   WBC 7.9 08/05/2020   NEUTROABS 4.0 08/05/2020   HGB 17.7 (H) 08/05/2020   HCT 49.6 08/05/2020   MCV 96.3 08/05/2020   PLT 195 08/05/2020   Lab Results  Component Value Date   IRON 145 08/05/2020   TIBC 385  08/05/2020   IRONPCTSAT 38 08/05/2020   Lab Results  Component Value Date   FERRITIN 104 08/05/2020     STUDIES: No results found.  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  Patient's hemoglobin and ferritin have trended up today. Previously, the remainder of his laboratory work is either negative or within normal limits.  It appears patient will require phlebotomy approximately every 3-6 months.  Proceed with 500 mL phlebotomy today.  Return to clinic in 3 months for laboratory work and phlebotomy only and then in  6 months with repeat laboratory, further evaluation, and continuation of treatment.  Of note, because patient is on Eliquis he cannot receive his phlebotomy through [facility].   2.  Weight loss: Patient now has a slight weight gain. 3.  Polycythemia: Likely secondary to elevated iron stores.  Phlebotomy as above.   I spent a total of 20 minutes reviewing chart data, face-to-face evaluation with the patient, counseling and coordination of care as detailed above.  Patient expressed understanding and was in agreement with this plan. He also understands that He can call clinic at any time with any questions, concerns, or complaints.    Lloyd Huger, MD   08/08/2020 7:44 AM

## 2020-08-05 ENCOUNTER — Inpatient Hospital Stay: Payer: Medicare HMO | Attending: Oncology

## 2020-08-05 ENCOUNTER — Other Ambulatory Visit: Payer: Self-pay

## 2020-08-05 DIAGNOSIS — Z86711 Personal history of pulmonary embolism: Secondary | ICD-10-CM | POA: Insufficient documentation

## 2020-08-05 DIAGNOSIS — Z8249 Family history of ischemic heart disease and other diseases of the circulatory system: Secondary | ICD-10-CM | POA: Diagnosis not present

## 2020-08-05 DIAGNOSIS — D751 Secondary polycythemia: Secondary | ICD-10-CM | POA: Insufficient documentation

## 2020-08-05 DIAGNOSIS — R634 Abnormal weight loss: Secondary | ICD-10-CM | POA: Diagnosis not present

## 2020-08-05 DIAGNOSIS — I5022 Chronic systolic (congestive) heart failure: Secondary | ICD-10-CM | POA: Diagnosis not present

## 2020-08-05 DIAGNOSIS — Z801 Family history of malignant neoplasm of trachea, bronchus and lung: Secondary | ICD-10-CM | POA: Diagnosis not present

## 2020-08-05 DIAGNOSIS — Z79899 Other long term (current) drug therapy: Secondary | ICD-10-CM | POA: Insufficient documentation

## 2020-08-05 DIAGNOSIS — Z8349 Family history of other endocrine, nutritional and metabolic diseases: Secondary | ICD-10-CM | POA: Insufficient documentation

## 2020-08-05 DIAGNOSIS — Z7901 Long term (current) use of anticoagulants: Secondary | ICD-10-CM | POA: Insufficient documentation

## 2020-08-05 DIAGNOSIS — I110 Hypertensive heart disease with heart failure: Secondary | ICD-10-CM | POA: Diagnosis not present

## 2020-08-05 LAB — IRON AND TIBC
Iron: 145 ug/dL (ref 45–182)
Saturation Ratios: 38 % (ref 17.9–39.5)
TIBC: 385 ug/dL (ref 250–450)
UIBC: 240 ug/dL

## 2020-08-05 LAB — CBC WITH DIFFERENTIAL/PLATELET
Abs Immature Granulocytes: 0.04 10*3/uL (ref 0.00–0.07)
Basophils Absolute: 0.1 10*3/uL (ref 0.0–0.1)
Basophils Relative: 1 %
Eosinophils Absolute: 0.2 10*3/uL (ref 0.0–0.5)
Eosinophils Relative: 3 %
HCT: 49.6 % (ref 39.0–52.0)
Hemoglobin: 17.7 g/dL — ABNORMAL HIGH (ref 13.0–17.0)
Immature Granulocytes: 1 %
Lymphocytes Relative: 37 %
Lymphs Abs: 2.9 10*3/uL (ref 0.7–4.0)
MCH: 34.4 pg — ABNORMAL HIGH (ref 26.0–34.0)
MCHC: 35.7 g/dL (ref 30.0–36.0)
MCV: 96.3 fL (ref 80.0–100.0)
Monocytes Absolute: 0.7 10*3/uL (ref 0.1–1.0)
Monocytes Relative: 8 %
Neutro Abs: 4 10*3/uL (ref 1.7–7.7)
Neutrophils Relative %: 50 %
Platelets: 195 10*3/uL (ref 150–400)
RBC: 5.15 MIL/uL (ref 4.22–5.81)
RDW: 12.6 % (ref 11.5–15.5)
WBC: 7.9 10*3/uL (ref 4.0–10.5)
nRBC: 0 % (ref 0.0–0.2)

## 2020-08-05 LAB — FERRITIN: Ferritin: 104 ng/mL (ref 24–336)

## 2020-08-06 ENCOUNTER — Encounter: Payer: Self-pay | Admitting: Oncology

## 2020-08-06 ENCOUNTER — Ambulatory Visit (INDEPENDENT_AMBULATORY_CARE_PROVIDER_SITE_OTHER): Payer: Medicare HMO

## 2020-08-06 ENCOUNTER — Inpatient Hospital Stay (HOSPITAL_BASED_OUTPATIENT_CLINIC_OR_DEPARTMENT_OTHER): Payer: Medicare HMO | Admitting: Oncology

## 2020-08-06 ENCOUNTER — Inpatient Hospital Stay: Payer: Medicare HMO

## 2020-08-06 ENCOUNTER — Other Ambulatory Visit: Payer: Self-pay

## 2020-08-06 VITALS — Ht 71.0 in | Wt 305.0 lb

## 2020-08-06 DIAGNOSIS — Z801 Family history of malignant neoplasm of trachea, bronchus and lung: Secondary | ICD-10-CM | POA: Diagnosis not present

## 2020-08-06 DIAGNOSIS — I5022 Chronic systolic (congestive) heart failure: Secondary | ICD-10-CM | POA: Diagnosis not present

## 2020-08-06 DIAGNOSIS — R634 Abnormal weight loss: Secondary | ICD-10-CM | POA: Diagnosis not present

## 2020-08-06 DIAGNOSIS — Z86711 Personal history of pulmonary embolism: Secondary | ICD-10-CM | POA: Diagnosis not present

## 2020-08-06 DIAGNOSIS — Z0000 Encounter for general adult medical examination without abnormal findings: Secondary | ICD-10-CM | POA: Diagnosis not present

## 2020-08-06 DIAGNOSIS — I110 Hypertensive heart disease with heart failure: Secondary | ICD-10-CM | POA: Diagnosis not present

## 2020-08-06 DIAGNOSIS — Z79899 Other long term (current) drug therapy: Secondary | ICD-10-CM | POA: Diagnosis not present

## 2020-08-06 DIAGNOSIS — Z7901 Long term (current) use of anticoagulants: Secondary | ICD-10-CM | POA: Diagnosis not present

## 2020-08-06 DIAGNOSIS — D751 Secondary polycythemia: Secondary | ICD-10-CM | POA: Diagnosis not present

## 2020-08-06 NOTE — Progress Notes (Signed)
Subjective:   Carlos Crosby is a 65 y.o. male who presents for Medicare Annual/Subsequent preventive examination.  Review of Systems    No ROS.  Medicare Wellness Virtual Visit.    Cardiac Risk Factors include: advanced age (>61mn, >>82women)     Objective:    Today'Crosby Vitals   08/06/20 0839  Weight: (!) 305 lb (138.3 kg)  Height: '5\' 11"'  (1.803 m)   Body mass index is 42.54 kg/m.  Advanced Directives 08/06/2020 01/22/2020 08/06/2019 03/21/2019 11/16/2018 08/09/2018 07/24/2018  Does Patient Have a Medical Advance Directive? No No No No No No No  Does patient want to make changes to medical advance directive? No - Patient declined - - - - - -  Would patient like information on creating a medical advance directive? - No - Patient declined No - Patient declined - - No - Patient declined No - Patient declined    Current Medications (verified) Outpatient Encounter Medications as of 08/06/2020  Medication Sig  . acetaminophen (TYLENOL) 500 MG tablet Take 1,000 mg by mouth every 6 (six) hours as needed.  . Cholecalciferol (VITAMIN D) 2000 units CAPS daily.   . digoxin (LANOXIN) 0.125 MG tablet TAKE 1 TABLET SIX DAYS A WEEK AS DIRECTED  . ELIQUIS 5 MG TABS tablet TAKE 1 TABLET TWICE DAILY  . furosemide (LASIX) 40 MG tablet Take 40 mg by mouth daily as needed.   .Marland Kitchenlisinopril (ZESTRIL) 2.5 MG tablet TAKE 1 TABLET EVERY DAY  . loratadine (CLARITIN) 10 MG tablet Take 10 mg by mouth daily.  . metoprolol succinate (TOPROL-XL) 100 MG 24 hr tablet TAKE 2 TABLETS EVERY MORNING  AND TAKE 1 TABLET EVERY EVENING  . Multiple Vitamins-Minerals (CENTRUM SILVER ADULT 50+) TABS Take 1 tablet by mouth daily.  . Potassium 95 MG TABS Take 1 tablet by mouth.  . pravastatin (PRAVACHOL) 80 MG tablet Take 1 tablet (80 mg total) by mouth every evening.  . traMADol (ULTRAM) 50 MG tablet TAKE 1 TABLET(50 MG) BY MOUTH EVERY 6 HOURS AS NEEDED FOR PAIN  . verapamil (CALAN-SR) 120 MG CR tablet TAKE 1 TABLET EVERY DAY    No facility-administered encounter medications on file as of 08/06/2020.    Allergies (verified) Allopurinol, Diltiazem, Ondansetron, and Penicillins   History: Past Medical History:  Diagnosis Date  . [place]   . [place]   . Depression   . Hemochromatosis   . History of kidney stones   . History of pulmonary embolism   . Hyperlipidemia   . Hypertension   . Migraine   . Obesity   . OSA (obstructive sleep apnea)    on CPAP  . Osteoarthritis    bilateral knees  . Rocky Mountain spotted fever 2011   Past Surgical History:  Procedure Laterality Date  . CARDIAC CATHETERIZATION  2012   [facility]  . KNEE SURGERY     right knee   . LITHOTRIPSY  07/31/2014  . URETERAL STENT PLACEMENT  2016  . URETEROSCOPY     Family History  Problem Relation Age of Onset  . Thyroid disease Mother   . Cancer Father        lung  . Arthritis Father   . Hypertension Father   . Cancer Paternal Grandfather        Throat cancer   Social History   Socioeconomic History  . Marital status: Married    Spouse name: Not on file  . Number of  children: Not on file  . Years of education: Not on file  . Highest education level: Not on file  Occupational History  . Not on file  Tobacco Use  . Smoking status: Never Smoker  . Smokeless tobacco: Never Used  Vaping Use  . Vaping Use: Never used  Substance and Sexual Activity  . Alcohol use: No    Comment: occasional; 3 times a year  . Drug use: No  . Sexual activity: Not Currently  Other Topics Concern  . Not on file  Social History Narrative  . Not on file   Social Determinants of Health   Financial Resource Strain: Low Risk   . Difficulty of Paying Living Expenses: Not hard at all  Food Insecurity: No Food Insecurity  . Worried About Charity fundraiser in the Last Year: Never true  . Ran Out of Food in the Last Year: Never true  Transportation Needs: No Transportation Needs  . Lack  of Transportation (Medical): No  . Lack of Transportation (Non-Medical): No  Physical Activity: Sufficiently Active  . Days of Exercise per Week: 3 days  . Minutes of Exercise per Session: 90 min  Stress: No Stress Concern Present  . Feeling of Stress : Not at all  Social Connections: Unknown  . Frequency of Communication with Friends and Family: More than three times a week  . Frequency of Social Gatherings with Friends and Family: More than three times a week  . Attends Religious Services: Not on file  . Active Member of Clubs or Organizations: Yes  . Attends Archivist Meetings: More than 4 times per year  . Marital Status: Not on file    Tobacco Counseling Counseling given: Not Answered   Clinical Intake:  Pre-visit preparation completed: Yes        Diabetes: No  How often do you need to have someone help you when you read instructions, pamphlets, or other written materials from your doctor or pharmacy?: 1 - Never    Interpreter Needed?: No      Activities of Daily Living In your present state of health, do you have any difficulty performing the following activities: 08/06/2020  Hearing? N  Vision? N  Difficulty concentrating or making decisions? N  Walking or climbing stairs? Y  Comment Chronic knee pain  Dressing or bathing? N  Doing errands, shopping? N  Preparing Food and eating ? N  Using the Toilet? N  In the past six months, have you accidently leaked urine? N  Do you have problems with loss of bowel control? N  Managing your Medications? N  Managing your Finances? N  Housekeeping or managing your Housekeeping? N  Some recent data might be hidden    Patient Care Team: Leone Haven, MD as PCP - General (Family Medicine) Rockey Situ Kathlene November, MD as Consulting Physician (Cardiology)  Indicate any recent Medical Services you may have received from other than [facility] providers in the past year (date may be approximate).     Assessment:    This is a routine wellness examination for Carlos Crosby.  I connected with Carlos Crosby today by telephone and verified that I am speaking with the correct person using two identifiers. Location patient: home Location provider: work Persons participating in the virtual visit: patient, Carlos scientist.    I discussed the limitations, risks, security and privacy concerns of performing an evaluation and management service by telephone and the availability of in person appointments. The patient expressed understanding and verbally consented  to this telephonic visit.    Interactive audio and video telecommunications were attempted between this provider and patient, however failed, due to patient having technical difficulties OR patient did not have access to video capability.  We continued and completed visit with audio only.  Some vital signs may be absent or patient reported.   Hearing/Vision screen  Hearing Screening   '125Hz'  '250Hz'  '500Hz'  '1000Hz'  '2000Hz'  '3000Hz'  '4000Hz'  '6000Hz'  '8000Hz'   Right ear:           Left ear:           Comments: Patient is able to hear conversational tones without difficulty.  No issues reported.  Vision Screening Comments: Wears corrective lenses  Visual acuity not assessed, virtual visit. They have seen their ophthalmologist.   Dietary issues and exercise activities discussed: Current Exercise Habits: Home exercise routine, Intensity: Mild  Healthy diet Good water intake  Goals      Patient Stated   .  Weight (lb) < 220 lb (99.8 kg) (pt-stated)      Depression Screen PHQ 2/9 Scores 08/06/2020 02/25/2020 10/18/2019 10/08/2019 08/06/2019 06/10/2019 02/05/2019  PHQ - 2 Score 0 0 0 0 0 0 0    Fall Risk Fall Risk  08/06/2020 02/25/2020 10/18/2019 10/08/2019 08/06/2019  Falls in the past year? 0 1 0 1 0  Number falls in past yr: 0 0 0 0 -  Injury with Fall? 0 0 0 0 -  Risk for fall due to : - - - - -  Follow up Falls evaluation completed Falls evaluation completed - Falls evaluation completed Falls  evaluation completed    [place]: Handrails in use when climbing stairs? Yes Home free of loose throw rugs in walkways, pet beds, electrical cords, etc? Yes  Adequate lighting in your home to reduce risk of falls? Yes   ASSISTIVE DEVICES UTILIZED TO PREVENT FALLS: Life alert? No  Use of a cane, walker or w/c? Yes     TIMED UP AND GO: Was the test performed? No . Virtual visit.   Cognitive Function: Patient is alert and oriented x3.  Denies difficulty focusing, making decisions, memory loss. MMSE/6CIT deferred. Normal by direct communication/observation.  MMSE - Mini Mental State Exam 07/24/2018 07/21/2017 07/20/2016  Orientation to time '5 5 5  ' Orientation to Place '5 5 5  ' Registration '3 3 3  ' Attention/ Calculation '5 5 5  ' Recall '3 3 2  ' Recall-comments - - 2 out of 3 words recalled  Language- name 2 objects '2 2 2  ' Language- repeat '1 1 1  ' Language- follow 3 step command '3 3 3  ' Language- read & follow direction '1 1 1  ' Write a sentence '1 1 1  ' Copy design '1 1 1  ' Total score '30 30 29        ' Immunizations Immunization History  Administered Date(Crosby) Administered  . Influenza,inj,Quad PF,6+ Mos 03/20/2013, 02/27/2015, 01/26/2017, 02/19/2018, 02/05/2019, 02/25/2020  . Influenza-Unspecified 03/08/2014, 02/26/2016, 02/17/2018, 02/19/2018  . Moderna Sars-Covid-2 Vaccination 08/03/2019, 08/31/2019, 03/30/2020  . Pneumococcal Polysaccharide-23 05/06/2015    TDAP status: Due, Education has been provided regarding the importance of this vaccine. Advised may receive this vaccine at local pharmacy or Health Dept. Aware to provide a copy of the vaccination record if obtained from local pharmacy or Health Dept. Verbalized acceptance and understanding. Deferred. Patient notes he and pcp are checking to see if injection has been received within the last 10 years.   Health Maintenance Health Maintenance  Topic Date Due  . Fecal DNA (Cologuard)  06/22/2020  .  TETANUS/TDAP  08/06/2021 (Originally 01/19/1975)  . COVID-19 Vaccine (4 - Booster for Moderna series) 09/28/2020  . INFLUENZA VACCINE  Completed  . Hepatitis C Screening  Completed  . HPV VACCINES  Aged Out  . HIV Screening  Discontinued   Cologuard- awaiting kit to arrive in mail.   Lung Cancer Screening: (Low Dose CT Chest recommended if Age 38-80 years, 30 pack-year currently smoking OR have quit w/in 15years.) does not qualify.   Dental Screening: Recommended annual dental exams for proper oral hygiene.  Community Resource Referral / Chronic Care Management: CRR required this visit?  No   CCM required this visit?  No      Plan:   Keep all routine maintenance appointments.   Follow up 10/09/20 @ 9:00  I have personally reviewed and noted the following in the patient'Crosby chart:   . Medical and social history . Use of alcohol, tobacco or illicit drugs  . Current medications and supplements . Functional ability and status . Nutritional status . Physical activity . Advanced directives . List of other physicians . Hospitalizations, surgeries, and ER visits in previous 12 months . Vitals . Screenings to include cognitive, depression, and falls . Referrals and appointments  In addition, I have reviewed and discussed with patient certain preventive protocols, quality metrics, and best practice recommendations. A written personalized care plan for preventive services as well as general preventive health recommendations were provided to patient via mychart.     Varney Biles, LPN   07/10/4973

## 2020-08-06 NOTE — Patient Instructions (Addendum)
[name] , Thank you for taking time to come for your Medicare Wellness Visit. I appreciate your ongoing commitment to your health goals. Please review the following plan we discussed and let me know if I can assist you in the future.   These are the goals we discussed: Goals      Patient Stated   .  Weight (lb) < 220 lb (99.8 kg) (pt-stated)       This is a list of the screening recommended for you and due dates:  Health Maintenance  Topic Date Due  . Cologuard (Stool DNA test)  06/22/2020  . Tetanus Vaccine  08/06/2021*  . COVID-19 Vaccine (4 - Booster for Moderna series) 09/28/2020  . Flu Shot  Completed  .  Hepatitis C: One time screening is recommended by Center for Disease Control  (CDC) for  adults born from [birth year] through 1964.   Completed  . HPV Vaccine  Aged Out  . HIV Screening  Discontinued  *Topic was postponed. The date shown is not the original due date.   Keep all routine maintenance appointments.   Follow up 10/09/20 @ 9:00  Advanced directives: not yet completed.   Conditions/risks identified: none new.   Next appointment: Follow up in one year for your annual wellness visit   Preventive Care 40-64 Years, Male Preventive care refers to lifestyle choices and visits with your health care provider that can promote health and wellness. What does preventive care include?  A yearly physical exam. This is also called an annual well check.  Dental exams once or twice a year.  Routine eye exams. Ask your health care provider how often you should have your eyes checked.  Personal lifestyle choices, including:  Daily care of your teeth and gums.  Regular physical activity.  Eating a healthy diet.  Avoiding tobacco and drug use.  Limiting alcohol use.  Practicing safe sex.  Taking low-dose aspirin every day starting at age 12. What happens during an annual well check? The services and screenings done by your health care provider during your annual well  check will depend on your age, overall health, lifestyle risk factors, and family history of disease. Counseling  Your health care provider may ask you questions about your:  Alcohol use.  Tobacco use.  Drug use.  Emotional well-being.  Home and relationship well-being.  Sexual activity.  Eating habits.  Work and work Statistician. Screening  You may have the following tests or measurements:  Height, weight, and BMI.  Blood pressure.  Lipid and cholesterol levels. These may be checked every 5 years, or more frequently if you are over 70 years old.  Skin check.  Lung cancer screening. You may have this screening every year starting at age 12 if you have a 30-pack-year history of smoking and currently smoke or have quit within the past 15 years.  Fecal occult blood test (FOBT) of the stool. You may have this test every year starting at age 39.  Flexible sigmoidoscopy or colonoscopy. You may have a sigmoidoscopy every 5 years or a colonoscopy every 10 years starting at age 24.  Prostate cancer screening. Recommendations will vary depending on your family history and other risks.  Hepatitis C blood test.  Hepatitis B blood test.  Sexually transmitted disease (STD) testing.  Diabetes screening. This is done by checking your blood sugar (glucose) after you have not eaten for a while (fasting). You may have this done every 1-3 years. Discuss your test results, treatment  options, and if necessary, the need for more tests with your health care provider. Vaccines  Your health care provider may recommend certain vaccines, such as:  Influenza vaccine. This is recommended every year.  Tetanus, diphtheria, and acellular pertussis (Tdap, Td) vaccine. You may need a Td booster every 10 years.  Zoster vaccine. You may need this after age 35.  Pneumococcal 13-valent conjugate (PCV13) vaccine. You may need this if you have certain conditions and have not been  vaccinated.  Pneumococcal polysaccharide (PPSV23) vaccine. You may need one or two doses if you smoke cigarettes or if you have certain conditions. Talk to your health care provider about which screenings and vaccines you need and how often you need them. This information is not intended to replace advice given to you by your health care provider. Make sure you discuss any questions you have with your health care provider. Document Released: 06/19/2015 Document Revised: 02/10/2016 Document Reviewed: 03/24/2015 Elsevier Interactive Patient Education  2017 [place] Prevention in the Home Falls can cause injuries. They can happen to people of all ages. There are many things you can do to make your home safe and to help prevent falls. What can I do on the outside of my home?  Regularly fix the edges of walkways and driveways and fix any cracks.  Remove anything that might make you trip as you walk through a door, such as a raised step or threshold.  Trim any bushes or trees on the path to your home.  Use bright outdoor lighting.  Clear any walking paths of anything that might make someone trip, such as rocks or tools.  Regularly check to see if handrails are loose or broken. Make sure that both sides of any steps have handrails.  Any raised decks and porches should have guardrails on the edges.  Have any leaves, snow, or ice cleared regularly.  Use sand or salt on walking paths during winter.  Clean up any spills in your garage right away. This includes oil or grease spills. What can I do in the bathroom?  Use night lights.  Install grab bars by the toilet and in the tub and shower. Do not use towel bars as grab bars.  Use non-skid mats or decals in the tub or shower.  If you need to sit down in the shower, use a plastic, non-slip stool.  Keep the floor dry. Clean up any water that spills on the floor as soon as it happens.  Remove soap buildup in the tub or shower  regularly.  Attach bath mats securely with double-sided non-slip rug tape.  Do not have throw rugs and other things on the floor that can make you trip. What can I do in the bedroom?  Use night lights.  Make sure that you have a light by your bed that is easy to reach.  Do not use any sheets or blankets that are too big for your bed. They should not hang down onto the floor.  Have a firm chair that has side arms. You can use this for support while you get dressed.  Do not have throw rugs and other things on the floor that can make you trip. What can I do in the kitchen?  Clean up any spills right away.  Avoid walking on wet floors.  Keep items that you use a lot in easy-to-reach places.  If you need to reach something above you, use a strong step stool that has a  grab bar.  Keep electrical cords out of the way.  Do not use floor polish or wax that makes floors slippery. If you must use wax, use non-skid floor wax.  Do not have throw rugs and other things on the floor that can make you trip. What can I do with my stairs?  Do not leave any items on the stairs.  Make sure that there are handrails on both sides of the stairs and use them. Fix handrails that are broken or loose. Make sure that handrails are as long as the stairways.  Check any carpeting to make sure that it is firmly attached to the stairs. Fix any carpet that is loose or worn.  Avoid having throw rugs at the top or bottom of the stairs. If you do have throw rugs, attach them to the floor with carpet tape.  Make sure that you have a light switch at the top of the stairs and the bottom of the stairs. If you do not have them, ask someone to add them for you. What else can I do to help prevent falls?  Wear shoes that:  Do not have high heels.  Have rubber bottoms.  Are comfortable and fit you well.  Are closed at the toe. Do not wear sandals.  If you use a stepladder:  Make sure that it is fully  opened. Do not climb a closed stepladder.  Make sure that both sides of the stepladder are locked into place.  Ask someone to hold it for you, if possible.  Clearly mark and make sure that you can see:  Any grab bars or handrails.  First and last steps.  Where the edge of each step is.  Use tools that help you move around (mobility aids) if they are needed. These include:  Canes.  Walkers.  Scooters.  Crutches.  Turn on the lights when you go into a dark area. Replace any light bulbs as soon as they burn out.  Set up your furniture so you have a clear path. Avoid moving your furniture around.  If any of your floors are uneven, fix them.  If there are any pets around you, be aware of where they are.  Review your medicines with your doctor. Some medicines can make you feel dizzy. This can increase your chance of falling. Ask your doctor what other things that you can do to help prevent falls. This information is not intended to replace advice given to you by your health care provider. Make sure you discuss any questions you have with your health care provider. Document Released: 03/19/2009 Document Revised: 10/29/2015 Document Reviewed: 06/27/2014 Elsevier Interactive Patient Education  2017 [employer].

## 2020-08-06 NOTE — Progress Notes (Signed)
Patient denies any concerns today.  

## 2020-08-07 ENCOUNTER — Other Ambulatory Visit: Payer: Self-pay | Admitting: Family Medicine

## 2020-08-10 ENCOUNTER — Encounter: Payer: Self-pay | Admitting: Family Medicine

## 2020-08-10 ENCOUNTER — Other Ambulatory Visit: Payer: Self-pay

## 2020-08-20 DIAGNOSIS — Z1211 Encounter for screening for malignant neoplasm of colon: Secondary | ICD-10-CM | POA: Diagnosis not present

## 2020-08-20 LAB — COLOGUARD: Cologuard: NEGATIVE

## 2020-08-26 LAB — EXTERNAL GENERIC LAB PROCEDURE: COLOGUARD: NEGATIVE

## 2020-08-26 LAB — COLOGUARD: COLOGUARD: NEGATIVE

## 2020-08-27 ENCOUNTER — Encounter: Payer: Self-pay | Admitting: Family Medicine

## 2020-08-28 ENCOUNTER — Telehealth: Payer: Self-pay

## 2020-08-28 NOTE — Telephone Encounter (Signed)
I called the patient and informed him of his negative cologuard results and he understood.  Nina,cma

## 2020-09-01 ENCOUNTER — Encounter: Payer: Self-pay | Admitting: Orthopaedic Surgery

## 2020-09-02 ENCOUNTER — Encounter: Payer: Self-pay | Admitting: Family Medicine

## 2020-09-07 DIAGNOSIS — G4730 Sleep apnea, unspecified: Secondary | ICD-10-CM | POA: Diagnosis not present

## 2020-09-07 DIAGNOSIS — G4733 Obstructive sleep apnea (adult) (pediatric): Secondary | ICD-10-CM | POA: Diagnosis not present

## 2020-09-13 ENCOUNTER — Other Ambulatory Visit: Payer: Self-pay | Admitting: Family Medicine

## 2020-09-14 ENCOUNTER — Other Ambulatory Visit: Payer: Self-pay | Admitting: Internal Medicine

## 2020-09-14 MED ORDER — TRAMADOL HCL 50 MG PO TABS: ORAL_TABLET | ORAL | 0 refills | Status: DC

## 2020-09-14 NOTE — Telephone Encounter (Signed)
Rx request sent to pharmacy.  

## 2020-09-14 NOTE — Telephone Encounter (Signed)
This is a [facility] pt 

## 2020-09-21 ENCOUNTER — Other Ambulatory Visit: Payer: Self-pay | Admitting: Cardiovascular Disease

## 2020-09-21 DIAGNOSIS — I4891 Unspecified atrial fibrillation: Secondary | ICD-10-CM

## 2020-09-21 NOTE — Telephone Encounter (Signed)
64,. 147.7kg, scr 0.92 02/25/20, lovw/gollan 03/02/20

## 2020-09-25 ENCOUNTER — Other Ambulatory Visit: Payer: Self-pay | Admitting: Cardiovascular Disease

## 2020-09-25 NOTE — Telephone Encounter (Signed)
Rx request sent to pharmacy.  

## 2020-09-29 ENCOUNTER — Ambulatory Visit: Payer: Medicare HMO | Admitting: Orthopaedic Surgery

## 2020-09-29 ENCOUNTER — Encounter: Payer: Self-pay | Admitting: Orthopaedic Surgery

## 2020-09-29 ENCOUNTER — Other Ambulatory Visit: Payer: Self-pay

## 2020-09-29 VITALS — Ht 71.0 in | Wt 325.0 lb

## 2020-09-29 DIAGNOSIS — M170 Bilateral primary osteoarthritis of knee: Secondary | ICD-10-CM | POA: Diagnosis not present

## 2020-09-29 MED ORDER — METHYLPREDNISOLONE ACETATE 40 MG/ML IJ SUSP
80.0000 mg | INTRAMUSCULAR | Status: AC | PRN
  Administered 2020-09-29: 80 mg via INTRA_ARTICULAR

## 2020-09-29 MED ORDER — BUPIVACAINE HCL 0.25 % IJ SOLN
2.0000 mL | INTRAMUSCULAR | Status: AC | PRN
  Administered 2020-09-29: 2 mL via INTRA_ARTICULAR

## 2020-09-29 MED ORDER — LIDOCAINE HCL 1 % IJ SOLN
2.0000 mL | INTRAMUSCULAR | Status: AC | PRN
  Administered 2020-09-29: 2 mL

## 2020-09-29 NOTE — Progress Notes (Signed)
Office Visit Note   Patient: Carlos Crosby           Date of Birth: 12-07-1955           MRN: [id number] Visit Date: 09/29/2020              Requested by: Leone Haven, MD [address] [place],   [address] PCP: Leone Haven, MD   Assessment & Plan: Visit Diagnoses:  1. Primary osteoarthritis of both knees   2. Morbid (severe) obesity due to excess calories [facility])     Plan: Mr. Massenburg has advanced osteoarthritis of both knees.  He finished a course of Orthovisc in February and is having recurrent pain.  He is overweight with a BMI of approximately 42 and has multiple medical comorbidities.  He  tries to lose weight but notes he has not been particularly successful.  He like to have bilateral cortisone injections of his knees today.  This will be performed in his course monitored  Follow-Up Instructions: Return if symptoms worsen or fail to improve.   Orders:  Orders Placed This Encounter  Procedures  . Large Joint Inj: bilateral knee   No orders of the defined types were placed in this encounter.     Procedures: Large Joint Inj: bilateral knee on 09/29/2020 1:26 PM Indications: diagnostic evaluation Details: 25 G 1.5 in needle, anteromedial approach  Arthrogram: No  Medications (Right): 2 mL lidocaine 1 %; 80 mg methylPREDNISolone acetate 40 MG/ML; 2 mL bupivacaine 0.25 % Medications (Left): 2 mL lidocaine 1 %; 80 mg methylPREDNISolone acetate 40 MG/ML; 2 mL bupivacaine 0.25 % Consent was given by the patient. Immediately prior to procedure a time out was called to verify the correct patient, procedure, equipment, support staff and site/side marked as required. Patient was prepped and draped in the usual sterile fashion.       Clinical Data: No additional findings.   Subjective: Chief Complaint  Patient presents with  . Left Knee - Pain  . Right Knee - Pain  Patient presents today for recurrent chronic bilateral knee pain. He is  wanting to have both knees injected with cortisone. He finished a series of orthovisc bilaterally two months ago. He states that the gel injections helped, but just don't last every long.   HPI  Review of Systems   Objective: Vital Signs: Ht 5\' 11"  (1.803 m)   Wt (!) 325 lb (147.4 kg)   BMI 45.33 kg/m   Physical Exam Constitutional:      Appearance: He is well-developed.  Eyes:     Pupils: Pupils are equal, round, and reactive to light.  Pulmonary:     Effort: Pulmonary effort is normal.  Skin:    General: Skin is warm and dry.  Neurological:     Mental Status: He is alert and oriented to person, place, and time.  Psychiatric:        Behavior: Behavior normal.     Ortho Exam awake alert and oriented x3.  Does have an antalgic gait related to his knees.  Bilateral knee varus.  Predominant medial joint pain with some patella crepitation.  Lacks a few degrees to full extension bilaterally and flexed little over 100 degrees.  No instability.  No popliteal pain or mass.  No calf pain  Specialty Comments:  No specialty comments available.  Imaging: No results found.   PMFS History: Patient Active Problem List   Diagnosis Date Noted  . Morbid (severe) obesity  due to excess calories ([place]) 09/29/2020  . Trigger finger, left ring finger 05/07/2020  . Chronic pain of left hand 04/15/2020  . Routine general medical examination at a health care facility 02/25/2020  . Rectal pain 10/18/2019  . Rash 10/08/2019  . Fall 02/05/2019  . Neuropathy 11/16/2017  . Prediabetes 11/16/2017  . Vitamin D deficiency 11/16/2017  . Hemochromatosis, hereditary ([place]) 07/23/2017  . Bleeding nose 05/18/2017  . Allergic rhinitis 01/26/2017  . Blue nevus 05/16/2016  . Hair loss 05/16/2016  . Abdominal pain 05/16/2016  . Umbilical hernia without obstruction and without gangrene 05/16/2016  . Atrial fibrillation ([place]) 09/26/2014  . Chronic diastolic congestive heart failure ([place]) 09/26/2014  .  Anxiety 09/26/2014  . Obstructive sleep apnea on CPAP 09/26/2014  . Essential hypertension 09/26/2014  . Hyperlipidemia 09/26/2014  . Osteoarthritis of both knees 09/26/2014  . Kidney stone 09/26/2014   Past Medical History:  Diagnosis Date  . Chronic a-fib ([place])   . Chronic systolic CHF (congestive heart failure) ([place])   . Depression   . Hemochromatosis   . History of kidney stones   . History of pulmonary embolism   . Hyperlipidemia   . Hypertension   . Migraine   . Obesity   . OSA (obstructive sleep apnea)    on CPAP  . Osteoarthritis    bilateral knees  . Rocky Mountain spotted fever 2011    Family History  Problem Relation Age of Onset  . Thyroid disease Mother   . Cancer Father        lung  . Arthritis Father   . Hypertension Father   . Cancer Paternal Grandfather        Throat cancer    Past Surgical History:  Procedure Laterality Date  . CARDIAC CATHETERIZATION  2012   [facility]  . KNEE SURGERY     right knee   . LITHOTRIPSY  07/31/2014  . URETERAL STENT PLACEMENT  2016  . URETEROSCOPY     Social History   Occupational History  . Not on file  Tobacco Use  . Smoking status: Never Smoker  . Smokeless tobacco: Never Used  Vaping Use  . Vaping Use: Never used  Substance and Sexual Activity  . Alcohol use: No    Comment: occasional; 3 times a year  . Drug use: No  . Sexual activity: Not Currently

## 2020-10-02 ENCOUNTER — Other Ambulatory Visit: Payer: Self-pay

## 2020-10-09 ENCOUNTER — Other Ambulatory Visit: Payer: Self-pay

## 2020-10-09 ENCOUNTER — Encounter: Payer: Self-pay | Admitting: Family Medicine

## 2020-10-09 ENCOUNTER — Telehealth (INDEPENDENT_AMBULATORY_CARE_PROVIDER_SITE_OTHER): Payer: Medicare HMO | Admitting: Family Medicine

## 2020-10-09 DIAGNOSIS — R059 Cough, unspecified: Secondary | ICD-10-CM | POA: Diagnosis not present

## 2020-10-09 DIAGNOSIS — E782 Mixed hyperlipidemia: Secondary | ICD-10-CM | POA: Diagnosis not present

## 2020-10-09 DIAGNOSIS — I4821 Permanent atrial fibrillation: Secondary | ICD-10-CM | POA: Diagnosis not present

## 2020-10-09 NOTE — Progress Notes (Signed)
Virtual Visit via video Note  This visit type was conducted due to national recommendations for restrictions regarding the COVID-19 pandemic (e.g. social distancing).  This format is felt to be most appropriate for this patient at this time.  All issues noted in this document were discussed and addressed.  No physical exam was performed (except for noted visual exam findings with Video Visits).   I connected with Carlos Crosby today at  9:00 AM EDT by a video enabled telemedicine application and verified that I am speaking with the correct person using two identifiers. Location patient: car Location provider: work Persons participating in the virtual visit: patient, provider  I discussed the limitations, risks, security and privacy concerns of performing an evaluation and management service by telephone and the availability of in person appointments. I also discussed with the patient that there may be a patient responsible charge related to this service. The patient expressed understanding and agreed to proceed.  Reason for visit: f/u  HPI: A. fib: Patient remains on digoxin, Eliquis, metoprolol, and verapamil.  He denies palpitations and bleeding issues.  Hyperlipidemia: Continues on pravastatin.  No chest pain, claudication, right upper quadrant pain, or myalgias.  Cough: Patient notes symptoms started 5 days ago.  He went to [place] this past weekend for a fraternity reunion.  He woke up feeling clogged up on Monday and had a mild cough.  He took a COVID test that day which was negative at home.  Yesterday he was not feeling great and he took another COVID test which was negative.  He feels much better today.  He coughs about once every 1/2 hour.  He notes no fevers or shortness of breath.  He is fully vaccinated and has had both boosters.   ROS: See pertinent positives and negatives per HPI.  Past Medical History:  Diagnosis Date  . Chronic a-fib ([place])   . Chronic systolic CHF  (congestive heart failure) ([place])   . Depression   . Hemochromatosis   . History of kidney stones   . History of pulmonary embolism   . Hyperlipidemia   . Hypertension   . Migraine   . Obesity   . OSA (obstructive sleep apnea)    on CPAP  . Osteoarthritis    bilateral knees  . Rocky Mountain spotted fever 2011    Past Surgical History:  Procedure Laterality Date  . CARDIAC CATHETERIZATION  2012   [facility]  . KNEE SURGERY     right knee   . LITHOTRIPSY  07/31/2014  . URETERAL STENT PLACEMENT  2016  . URETEROSCOPY      Family History  Problem Relation Age of Onset  . Thyroid disease Mother   . Cancer Father        lung  . Arthritis Father   . Hypertension Father   . Cancer Paternal Grandfather        Throat cancer    SOCIAL HX: Non-smoker   Current Outpatient Medications:  .  acetaminophen (TYLENOL) 500 MG tablet, Take 1,000 mg by mouth every 6 (six) hours as needed., Disp: , Rfl:  .  Cholecalciferol (VITAMIN D) 2000 units CAPS, daily. , Disp: , Rfl:  .  digoxin (LANOXIN) 0.125 MG tablet, TAKE 1 TABLET SIX DAYS A WEEK AS DIRECTED, Disp: 78 tablet, Rfl: 2 .  ELIQUIS 5 MG TABS tablet, TAKE 1 TABLET TWICE DAILY, Disp: 180 tablet, Rfl: 1 .  furosemide (LASIX) 40 MG tablet, Take 40 mg by mouth daily as needed. ,  Disp: , Rfl:  .  lisinopril (ZESTRIL) 2.5 MG tablet, Take 1 tablet (2.5 mg total) by mouth daily., Disp: 90 tablet, Rfl: 1 .  loratadine (CLARITIN) 10 MG tablet, Take 10 mg by mouth daily., Disp: , Rfl:  .  metoprolol succinate (TOPROL-XL) 100 MG 24 hr tablet, TAKE 2 TABLETS EVERY MORNING  AND TAKE 1 TABLET EVERY EVENING, Disp: 270 tablet, Rfl: 3 .  Multiple Vitamins-Minerals (CENTRUM SILVER ADULT 50+) TABS, Take 1 tablet by mouth daily., Disp: , Rfl:  .  Potassium 95 MG TABS, Take 1 tablet by mouth., Disp: , Rfl:  .  pravastatin (PRAVACHOL) 80 MG tablet, Take 1 tablet (80 mg total) by mouth every evening., Disp: 90 tablet, Rfl: 3 .  traMADol (ULTRAM) 50 MG tablet, TAKE  1 TABLET(50 MG) BY MOUTH EVERY 6 HOURS AS NEEDED FOR PAIN, Disp: 90 tablet, Rfl: 0 .  verapamil (CALAN-SR) 120 MG CR tablet, TAKE 1 TABLET EVERY DAY, Disp: 90 tablet, Rfl: 1  EXAM:  VITALS per patient if applicable:  GENERAL: alert, oriented, appears well and in no acute distress  HEENT: atraumatic, conjunttiva clear, no obvious abnormalities on inspection of external nose and ears  NECK: normal movements of the head and neck  LUNGS: on inspection no signs of respiratory distress, breathing rate appears normal, no obvious gross SOB, gasping or wheezing  CV: no obvious cyanosis  MS: moves all visible extremities without noticeable abnormality  PSYCH/NEURO: pleasant and cooperative, no obvious depression or anxiety, speech and thought processing grossly intact  ASSESSMENT AND PLAN:  Discussed the following assessment and plan:  Problem List Items Addressed This Visit    Atrial fibrillation ([place])    Asymptomatic.  He continues on digoxin, Eliquis 5 mg twice daily, metoprolol 200 mg in the morning and 100 mg at night, and verapamil 120 mg daily.  Medications are managed by his cardiologist.      Hyperlipidemia    Tolerating medication.  He will continue on pravastatin 80 mg once daily.  Plan for lab work at his next visit.      Cough    I suspect this is related to a viral URI.  His two negative home COVID tests make COVID less likely.  He is feeling better today and he can trial Mucinex to help with any congestion.  Discussed staying adequately hydrated for this medication to work.  If he starts to feel worse he will let us know.  We did offer him a COVID PCR test though he declines this today.          I discussed the assessment and treatment plan with the patient. The patient was provided an opportunity to ask questions and all were answered. The patient agreed with the plan and demonstrated an understanding of the instructions.   The patient was advised to call back or seek  an in-person evaluation if the symptoms worsen or if the condition fails to improve as anticipated.   [name], MD

## 2020-10-09 NOTE — Assessment & Plan Note (Signed)
Tolerating medication.  He will continue on pravastatin 80 mg once daily.  Plan for lab work at his next visit.

## 2020-10-09 NOTE — Assessment & Plan Note (Signed)
Asymptomatic.  He continues on digoxin, Eliquis 5 mg twice daily, metoprolol 200 mg in the morning and 100 mg at night, and verapamil 120 mg daily.  Medications are managed by his cardiologist.

## 2020-10-09 NOTE — Assessment & Plan Note (Addendum)
I suspect this is related to a viral URI.  His two negative home COVID tests make COVID less likely.  He is feeling better today and he can trial Mucinex to help with any congestion.  Discussed staying adequately hydrated for this medication to work.  If he starts to feel worse he will let us know.  We did offer him a COVID PCR test though he declines this today.

## 2020-10-11 ENCOUNTER — Emergency Department: Payer: Medicare HMO

## 2020-10-11 ENCOUNTER — Other Ambulatory Visit: Payer: Self-pay

## 2020-10-11 ENCOUNTER — Emergency Department
Admission: EM | Admit: 2020-10-11 | Discharge: 2020-10-11 | Disposition: A | Discharge status: Home / Self Care | Payer: Medicare HMO | Attending: Student in an Organized Health Care Education/Training Program | Admitting: Student in an Organized Health Care Education/Training Program

## 2020-10-11 ENCOUNTER — Encounter: Payer: Self-pay | Admitting: Family Medicine

## 2020-10-11 DIAGNOSIS — Z79899 Other long term (current) drug therapy: Secondary | ICD-10-CM | POA: Insufficient documentation

## 2020-10-11 DIAGNOSIS — I110 Hypertensive heart disease with heart failure: Secondary | ICD-10-CM | POA: Diagnosis not present

## 2020-10-11 DIAGNOSIS — Z7901 Long term (current) use of anticoagulants: Secondary | ICD-10-CM | POA: Insufficient documentation

## 2020-10-11 DIAGNOSIS — Z20822 Contact with and (suspected) exposure to covid-19: Secondary | ICD-10-CM | POA: Insufficient documentation

## 2020-10-11 DIAGNOSIS — R059 Cough, unspecified: Secondary | ICD-10-CM | POA: Diagnosis not present

## 2020-10-11 DIAGNOSIS — I5042 Chronic combined systolic (congestive) and diastolic (congestive) heart failure: Secondary | ICD-10-CM | POA: Diagnosis not present

## 2020-10-11 LAB — CBC WITH DIFFERENTIAL/PLATELET
Abs Immature Granulocytes: 0.11 10*3/uL — ABNORMAL HIGH (ref 0.00–0.07)
Basophils Absolute: 0.1 10*3/uL (ref 0.0–0.1)
Basophils Relative: 1 %
Eosinophils Absolute: 0.2 10*3/uL (ref 0.0–0.5)
Eosinophils Relative: 1 %
HCT: 51.5 % (ref 39.0–52.0)
Hemoglobin: 18 g/dL — ABNORMAL HIGH (ref 13.0–17.0)
Immature Granulocytes: 1 %
Lymphocytes Relative: 19 %
Lymphs Abs: 2.5 10*3/uL (ref 0.7–4.0)
MCH: 33.6 pg (ref 26.0–34.0)
MCHC: 35 g/dL (ref 30.0–36.0)
MCV: 96.3 fL (ref 80.0–100.0)
Monocytes Absolute: 0.9 10*3/uL (ref 0.1–1.0)
Monocytes Relative: 7 %
Neutro Abs: 9.4 10*3/uL — ABNORMAL HIGH (ref 1.7–7.7)
Neutrophils Relative %: 71 %
Platelets: 203 10*3/uL (ref 150–400)
RBC: 5.35 MIL/uL (ref 4.22–5.81)
RDW: 12.7 % (ref 11.5–15.5)
WBC: 13.1 10*3/uL — ABNORMAL HIGH (ref 4.0–10.5)
nRBC: 0 % (ref 0.0–0.2)

## 2020-10-11 LAB — COMPREHENSIVE METABOLIC PANEL
ALT: 26 U/L (ref 0–44)
AST: 21 U/L (ref 15–41)
Albumin: 4.4 g/dL (ref 3.5–5.0)
Alkaline Phosphatase: 51 U/L (ref 38–126)
Anion gap: 11 (ref 5–15)
BUN: 28 mg/dL — ABNORMAL HIGH (ref 8–23)
CO2: 23 mmol/L (ref 22–32)
Calcium: 9.3 mg/dL (ref 8.9–10.3)
Chloride: 102 mmol/L (ref 98–111)
Creatinine, Ser: 0.97 mg/dL (ref 0.61–1.24)
GFR, Estimated: >60 mL/min (ref >60)
Glucose, Bld: 123 mg/dL — ABNORMAL HIGH (ref 70–99)
Potassium: 4.3 mmol/L (ref 3.5–5.1)
Sodium: 136 mmol/L (ref 135–145)
Total Bilirubin: 1.3 mg/dL — ABNORMAL HIGH (ref 0.3–1.2)
Total Protein: 7.8 g/dL (ref 6.5–8.1)

## 2020-10-11 LAB — RESP PANEL BY RT-PCR (FLU A&B, COVID) ARPGX2
Influenza A by PCR: NEGATIVE
Influenza B by PCR: NEGATIVE
SARS Coronavirus 2 by RT PCR: NEGATIVE

## 2020-10-11 MED ORDER — BENZONATATE 100 MG PO CAPS: 100.0000 mg | ORAL_CAPSULE | Freq: Three times a day (TID) | ORAL | 0 refills | Status: DC | PRN

## 2020-10-11 MED ORDER — BENZONATATE 100 MG PO CAPS
100.0000 mg | ORAL_CAPSULE | Freq: Once | ORAL | Status: AC
  Administered 2020-10-11: 100 mg via ORAL
  Filled 2020-10-11: qty 1

## 2020-10-11 MED ORDER — DOXYCYCLINE HYCLATE 100 MG PO TABS: 100.0000 mg | ORAL_TABLET | Freq: Two times a day (BID) | ORAL | 0 refills | Status: DC

## 2020-10-11 NOTE — ED Provider Notes (Signed)
[facility] Emergency Department Provider Note    Event Date/Time   First MD Initiated Contact with Patient 10/11/20 [phone]     (approximate)  I have reviewed the triage vital signs and the nursing notes.   HISTORY  Chief Complaint URI    HPI Carlos Crosby is a 65 y.o. male below listed past medical history presents to the ER for cough.  States his yellowish appearing phlegm.  No measured fevers but was recently at reunion in [place].  Took 2 home rapid test both of which were negative.  States he was having coughing fits this evening so he came to the ER as he could not sleep.  Does have a history of A. fib he is on anticoagulation.  Denies any chest pain or pressure.  No lower extremity swelling.    Past Medical History:  Diagnosis Date  . Chronic a-fib ([place])   . Chronic systolic CHF (congestive heart failure) ([place])   . Depression   . Hemochromatosis   . History of kidney stones   . History of pulmonary embolism   . Hyperlipidemia   . Hypertension   . Migraine   . Obesity   . OSA (obstructive sleep apnea)    on CPAP  . Osteoarthritis    bilateral knees  . Rocky Mountain spotted fever 2011   Family History  Problem Relation Age of Onset  . Thyroid disease Mother   . Cancer Father        lung  . Arthritis Father   . Hypertension Father   . Cancer Paternal Grandfather        Throat cancer   Past Surgical History:  Procedure Laterality Date  . CARDIAC CATHETERIZATION  2012   [facility]  . KNEE SURGERY     right knee   . LITHOTRIPSY  07/31/2014  . URETERAL STENT PLACEMENT  2016  . URETEROSCOPY     Patient Active Problem List   Diagnosis Date Noted  . Cough 10/09/2020  . Morbid (severe) obesity due to excess calories ([place]) 09/29/2020  . Trigger finger, left ring finger 05/07/2020  . Chronic pain of left hand 04/15/2020  . Routine general medical examination at a health care facility 02/25/2020  . Rectal pain 10/18/2019  . Rash 10/08/2019  .  Fall 02/05/2019  . Neuropathy 11/16/2017  . Prediabetes 11/16/2017  . Vitamin D deficiency 11/16/2017  . Hemochromatosis, hereditary ([place]) 07/23/2017  . Bleeding nose 05/18/2017  . Allergic rhinitis 01/26/2017  . Blue nevus 05/16/2016  . Hair loss 05/16/2016  . Abdominal pain 05/16/2016  . Umbilical hernia without obstruction and without gangrene 05/16/2016  . Atrial fibrillation ([place]) 09/26/2014  . Chronic diastolic congestive heart failure ([place]) 09/26/2014  . Anxiety 09/26/2014  . Obstructive sleep apnea on CPAP 09/26/2014  . Essential hypertension 09/26/2014  . Hyperlipidemia 09/26/2014  . Osteoarthritis of both knees 09/26/2014  . Kidney stone 09/26/2014      Prior to Admission medications   Medication Sig Start Date End Date Taking? Authorizing Provider  acetaminophen (TYLENOL) 500 MG tablet Take 1,000 mg by mouth every 6 (six) hours as needed.    [provider]  Cholecalciferol (VITAMIN D) 2000 units CAPS daily.     [provider]  digoxin (LANOXIN) 0.125 MG tablet TAKE 1 TABLET SIX DAYS A WEEK AS DIRECTED 06/08/20   Minna Merritts, MD  ELIQUIS 5 MG TABS tablet TAKE 1 TABLET TWICE DAILY 09/21/20   Minna Merritts, MD  furosemide (LASIX) 40 MG tablet Take 40 mg by mouth daily as needed.     [provider]  lisinopril (ZESTRIL) 2.5 MG tablet Take 1 tablet (2.5 mg total) by mouth daily. 09/14/20   Deboraha Sprang, MD  loratadine (CLARITIN) 10 MG tablet Take 10 mg by mouth daily.    [provider]  metoprolol succinate (TOPROL-XL) 100 MG 24 hr tablet TAKE 2 TABLETS EVERY MORNING  AND TAKE 1 TABLET EVERY EVENING 06/24/20   Minna Merritts, MD  Multiple Vitamins-Minerals (CENTRUM SILVER ADULT 50+) TABS Take 1 tablet by mouth daily. 09/04/14   [provider]  Potassium 95 MG TABS Take 1 tablet by mouth.    [provider]  pravastatin (PRAVACHOL) 80 MG tablet Take 1 tablet (80 mg total) by mouth every evening. 03/31/20    Minna Merritts, MD  traMADol (ULTRAM) 50 MG tablet TAKE 1 TABLET(50 MG) BY MOUTH EVERY 6 HOURS AS NEEDED FOR PAIN 09/14/20   Leone Haven, MD  verapamil (CALAN-SR) 120 MG CR tablet TAKE 1 TABLET EVERY DAY 09/25/20   Minna Merritts, MD    Allergies Allopurinol, Diltiazem, Ondansetron, and Penicillins    Social History Social History   Tobacco Use  . Smoking status: Never Smoker  . Smokeless tobacco: Never Used  Vaping Use  . Vaping Use: Never used  Substance Use Topics  . Alcohol use: No    Comment: occasional; 3 times a year  . Drug use: No    Review of Systems Patient denies headaches, rhinorrhea, blurry vision, numbness, shortness of breath, chest pain, edema, cough, abdominal pain, nausea, vomiting, diarrhea, dysuria, fevers, rashes or hallucinations unless otherwise stated above in HPI. ____________________________________________   PHYSICAL EXAM:  VITAL SIGNS: Vitals:   10/11/20 0245  BP: (!) 142/104  Pulse: 81  Resp: 18  Temp: 98.5 F (36.9 C)  SpO2: 98%    Constitutional: Alert and oriented.  Eyes: Conjunctivae are normal.  Head: Atraumatic. Nose: No congestion/rhinnorhea. Mouth/Throat: Mucous membranes are moist.   Neck: No stridor. Painless ROM.  Cardiovascular: Normal rate, regular rhythm. Grossly normal heart sounds.  Good peripheral circulation. Respiratory: Normal respiratory effort.  No retractions. Lungs CTAB. Gastrointestinal: Soft and nontender. No distention. No abdominal bruits. No CVA tenderness. Genitourinary:  Musculoskeletal: No lower extremity tenderness nor edema.  No joint effusions. Neurologic:  Normal speech and language. No gross focal neurologic deficits are appreciated. No facial droop Skin:  Skin is warm, dry and intact. No rash noted. Psychiatric: Mood and affect are normal. Speech and behavior are normal.  ____________________________________________   LABS (all labs ordered are listed, but only abnormal results  are displayed)  Results for orders placed or performed during the hospital encounter of 10/11/20 (from the past 24 hour(s))  CBC with Differential/Platelet     Status: Abnormal   Collection Time: 10/11/20  3:55 AM  Result Value Ref Range   WBC 13.1 (H) 4.0 - 10.5 K/uL   RBC 5.35 4.22 - 5.81 MIL/uL   Hemoglobin 18.0 (H) 13.0 - 17.0 g/dL   HCT 51.5 39.0 - 52.0 %   MCV 96.3 80.0 - 100.0 fL   MCH 33.6 26.0 - 34.0 pg   MCHC 35.0 30.0 - 36.0 g/dL   RDW 12.7 11.5 - 15.5 %   Platelets 203 150 - 400 K/uL   nRBC 0.0 0.0 - 0.2 %   Neutrophils Relative % 71 %   Neutro Abs 9.4 (H) 1.7 - 7.7 K/uL  Lymphocytes Relative 19 %   Lymphs Abs 2.5 0.7 - 4.0 K/uL   Monocytes Relative 7 %   Monocytes Absolute 0.9 0.1 - 1.0 K/uL   Eosinophils Relative 1 %   Eosinophils Absolute 0.2 0.0 - 0.5 K/uL   Basophils Relative 1 %   Basophils Absolute 0.1 0.0 - 0.1 K/uL   Immature Granulocytes 1 %   Abs Immature Granulocytes 0.11 (H) 0.00 - 0.07 K/uL  Comprehensive metabolic panel     Status: Abnormal   Collection Time: 10/11/20  3:55 AM  Result Value Ref Range   Sodium 136 135 - 145 mmol/L   Potassium 4.3 3.5 - 5.1 mmol/L   Chloride 102 98 - 111 mmol/L   CO2 23 22 - 32 mmol/L   Glucose, Bld 123 (H) 70 - 99 mg/dL   BUN 28 (H) 8 - 23 mg/dL   Creatinine, Ser 0.97 0.61 - 1.24 mg/dL   Calcium 9.3 8.9 - 10.3 mg/dL   Total Protein 7.8 6.5 - 8.1 g/dL   Albumin 4.4 3.5 - 5.0 g/dL   AST 21 15 - 41 U/L   ALT 26 0 - 44 U/L   Alkaline Phosphatase 51 38 - 126 U/L   Total Bilirubin 1.3 (H) 0.3 - 1.2 mg/dL   GFR, Estimated >60 >60 mL/min   Anion gap 11 5 - 15   ____________________________________________  ____________________________________________  RADIOLOGY  I personally reviewed all radiographic images ordered to evaluate for the above acute complaints and reviewed radiology reports and findings.  These findings were personally discussed with the patient.  Please see medical record for radiology  report.  ____________________________________________   PROCEDURES  Procedure(s) performed:  Procedures    Critical Care performed: no ____________________________________________   INITIAL IMPRESSION / ASSESSMENT AND PLAN / ED COURSE  Pertinent labs & imaging results that were available during my care of the patient were reviewed by me and considered in my medical decision making (see chart for details).   DDX: covid, pna, chf, copd, asthma, uri  DKARI DORNFELD is a 65 y.o. who presents to the ED with presentation as described above.  Patient nontoxic-appearing not hypoxic.  Ambulating in the ER no distress but with persistent cough with complex past medical history.  Blood work and x-ray ordered for the above differential.  Mild leukocytosis but no infiltrate.  He is on Eliquis not consistent with PE.  COVID and flu were negative.  No appreciable wheezing on exam.  No crackles.  Has had occasional cough while here in the ER.  Given recent travel mild white count will cover for commune acquired pneumonia given productive cough.  Discussed signs and symptoms which she should return to the ER.  Patient agreeable to plan.     The patient was evaluated in Emergency Department today for the symptoms described in the history of present illness. He/she was evaluated in the context of the global COVID-19 pandemic, which necessitated consideration that the patient might be at risk for infection with the SARS-CoV-2 virus that causes COVID-19. Institutional protocols and algorithms that pertain to the evaluation of patients at risk for COVID-19 are in a state of rapid change based on information released by regulatory bodies including the CDC and federal and state organizations. These policies and algorithms were followed during the patient's care in the ED.  As part of my medical decision making, I reviewed the following data within the [place] notes reviewed and  incorporated, Labs reviewed, notes from  prior ED visits and [place] Controlled Substance Database   ____________________________________________   FINAL CLINICAL IMPRESSION(S) / ED DIAGNOSES  Final diagnoses:  Cough      NEW MEDICATIONS STARTED DURING THIS VISIT:  New Prescriptions   No medications on file     Note:  This document was prepared using Dragon voice recognition software and may include unintentional dictation errors.    Merlyn Lot, MD 10/11/20 [phone]

## 2020-10-11 NOTE — ED Triage Notes (Signed)
past weekend in [place] for reunion around 200people, mon: head stuffiness, runny nose, cough with clear-green phlegm, neg home test x2 (mon, wed). Had virtual exam on wed w/o new prescriptions, uses cpap and was having more coughing spells bringing pt to ed tonight. Has been taking mucinex. Has had booster x2

## 2020-10-14 ENCOUNTER — Encounter: Payer: Self-pay | Admitting: Family Medicine

## 2020-10-14 MED ORDER — DOXYCYCLINE HYCLATE 100 MG PO TABS: 100.0000 mg | ORAL_TABLET | Freq: Two times a day (BID) | ORAL | 0 refills | Status: AC

## 2020-10-15 ENCOUNTER — Telehealth: Payer: Self-pay | Admitting: Family Medicine

## 2020-10-15 ENCOUNTER — Telehealth: Payer: Self-pay

## 2020-10-15 ENCOUNTER — Ambulatory Visit: Payer: Medicare HMO | Admitting: Internal Medicine

## 2020-10-15 NOTE — Telephone Encounter (Signed)
[facility] in [place] called, they need to know how many pills to dispense for patient's doxycycline (VIBRA-TABS) 100 MG tablet, I currently says to dispense 4 pills.

## 2020-10-15 NOTE — Telephone Encounter (Signed)
The doxycycline quantity is correct.  This is an add-on to the 5-day course that he has already been taking.

## 2020-10-15 NOTE — Telephone Encounter (Signed)
Please see the other phone note.

## 2020-10-15 NOTE — Telephone Encounter (Signed)
I called Amy at [facility] and explained that the quantity was correct and they stated they would fic fix the instruction part.  Aissa Lisowski,cma

## 2020-10-16 ENCOUNTER — Encounter: Payer: Self-pay | Admitting: Family Medicine

## 2020-10-16 NOTE — Telephone Encounter (Signed)
PT called in stating that he is coughing like crazy and white phlem is coming up. He took some allergy meds and it seems to be helping but he would like to know what else he can take to get to feeling better.

## 2020-11-06 ENCOUNTER — Other Ambulatory Visit: Payer: Self-pay | Admitting: Family Medicine

## 2020-11-06 NOTE — Telephone Encounter (Signed)
RX Refill:tramadol Last Seen:10-09-20 Last ordered:09-14-20

## 2020-11-09 ENCOUNTER — Encounter: Payer: Self-pay | Admitting: Oncology

## 2020-11-09 ENCOUNTER — Other Ambulatory Visit: Payer: Self-pay

## 2020-11-09 ENCOUNTER — Inpatient Hospital Stay: Payer: Medicare HMO

## 2020-11-09 ENCOUNTER — Inpatient Hospital Stay: Payer: Medicare HMO | Attending: Oncology

## 2020-11-09 LAB — CBC WITH DIFFERENTIAL/PLATELET
Abs Immature Granulocytes: 0.04 10*3/uL (ref 0.00–0.07)
Basophils Absolute: 0.1 10*3/uL (ref 0.0–0.1)
Basophils Relative: 1 %
Eosinophils Absolute: 0.2 10*3/uL (ref 0.0–0.5)
Eosinophils Relative: 4 %
HCT: 49 % (ref 39.0–52.0)
Hemoglobin: 17.4 g/dL — ABNORMAL HIGH (ref 13.0–17.0)
Immature Granulocytes: 1 %
Lymphocytes Relative: 39 %
Lymphs Abs: 2.5 10*3/uL (ref 0.7–4.0)
MCH: 34.1 pg — ABNORMAL HIGH (ref 26.0–34.0)
MCHC: 35.5 g/dL (ref 30.0–36.0)
MCV: 95.9 fL (ref 80.0–100.0)
Monocytes Absolute: 0.6 10*3/uL (ref 0.1–1.0)
Monocytes Relative: 9 %
Neutro Abs: 3 10*3/uL (ref 1.7–7.7)
Neutrophils Relative %: 46 %
Platelets: 170 10*3/uL (ref 150–400)
RBC: 5.11 MIL/uL (ref 4.22–5.81)
RDW: 12.8 % (ref 11.5–15.5)
WBC: 6.4 10*3/uL (ref 4.0–10.5)
nRBC: 0 % (ref 0.0–0.2)

## 2020-11-09 LAB — IRON AND TIBC
Iron: 212 ug/dL — ABNORMAL HIGH (ref 45–182)
Saturation Ratios: 55 % — ABNORMAL HIGH (ref 17.9–39.5)
TIBC: 386 ug/dL (ref 250–450)
UIBC: 174 ug/dL

## 2020-11-09 LAB — FERRITIN: Ferritin: 125 ng/mL (ref 24–336)

## 2020-11-09 NOTE — Patient Instructions (Signed)

## 2020-11-09 NOTE — Progress Notes (Signed)
Hgb 17.4.  400 ml therapeutic phlebotomy performed. Pt tolerated procedure well. VSS. Discharged to home feeling well.

## 2020-11-16 ENCOUNTER — Encounter: Payer: Self-pay | Admitting: Orthopaedic Surgery

## 2020-11-18 ENCOUNTER — Telehealth: Payer: Self-pay

## 2020-11-18 NOTE — Telephone Encounter (Signed)
Carlos Crosby From [employer] called regarding forms from a knee brace Carlos Crosby is requesting a update on the for, she wants to know if the forms have been filled out and signed call back:[phone]

## 2020-11-20 NOTE — Telephone Encounter (Signed)
Called and spoke with Carlos Crosby. I made sure she was aware that forms have been faxed.

## 2020-11-24 DIAGNOSIS — Z9989 Dependence on other enabling machines and devices: Secondary | ICD-10-CM | POA: Diagnosis not present

## 2020-11-24 DIAGNOSIS — G4733 Obstructive sleep apnea (adult) (pediatric): Secondary | ICD-10-CM | POA: Diagnosis not present

## 2020-11-25 ENCOUNTER — Telehealth: Payer: Self-pay | Admitting: Orthopaedic Surgery

## 2020-11-25 NOTE — Telephone Encounter (Signed)
Katie From [employer] called regarding forms for a knee brace. Fax number is [phone]

## 2020-11-26 NOTE — Telephone Encounter (Signed)
Called and spoke with someone at [employer]. I have refaxed paperwork to number she provided. Fax [phone].

## 2020-11-30 ENCOUNTER — Other Ambulatory Visit: Payer: Self-pay

## 2020-11-30 DIAGNOSIS — I4891 Unspecified atrial fibrillation: Secondary | ICD-10-CM

## 2020-11-30 NOTE — Telephone Encounter (Signed)
Eliquis 5 mg BID script faxed to  [email or web address] [address] [place]  R3C 2H6 [place]  Pt reports can get this cheaper from [place]

## 2020-12-08 DIAGNOSIS — G4730 Sleep apnea, unspecified: Secondary | ICD-10-CM | POA: Diagnosis not present

## 2020-12-08 DIAGNOSIS — G4733 Obstructive sleep apnea (adult) (pediatric): Secondary | ICD-10-CM | POA: Diagnosis not present

## 2020-12-10 ENCOUNTER — Other Ambulatory Visit
Admission: RE | Admit: 2020-12-10 | Discharge: 2020-12-10 | Disposition: A | Discharge status: Home / Self Care | Payer: Medicare HMO | Source: Ambulatory Visit | Attending: Internal Medicine | Admitting: Internal Medicine

## 2020-12-10 ENCOUNTER — Ambulatory Visit: Payer: Medicare HMO | Admitting: Internal Medicine

## 2020-12-10 ENCOUNTER — Other Ambulatory Visit: Payer: Self-pay

## 2020-12-10 ENCOUNTER — Encounter: Payer: Self-pay | Admitting: Internal Medicine

## 2020-12-10 VITALS — BP 110/80 | HR 87 | Ht 71.0 in | Wt 339.0 lb

## 2020-12-10 DIAGNOSIS — I4821 Permanent atrial fibrillation: Secondary | ICD-10-CM | POA: Insufficient documentation

## 2020-12-10 DIAGNOSIS — I5032 Chronic diastolic (congestive) heart failure: Secondary | ICD-10-CM

## 2020-12-10 DIAGNOSIS — Z79899 Other long term (current) drug therapy: Secondary | ICD-10-CM

## 2020-12-10 LAB — DIGOXIN LEVEL: Digoxin Level: 0.4 ng/mL — ABNORMAL LOW (ref 0.8–2.0)

## 2020-12-10 NOTE — Patient Instructions (Signed)
Medication Instructions:  - Your physician recommends that you continue on your current medications as directed. Please refer to the Current Medication list given to you today.  *If you need a refill on your cardiac medications before your next appointment, please call your pharmacy*   Lab Work: - Your physician recommends that you have lab work today: [place] [facility] at [facility] 1st desk on the right to check in, past the screening table Lab hours: Monday- Friday (7:30 am- 5:30 pm)   If you have labs (blood work) drawn today and your tests are completely normal, you will receive your results only by: MyChart Message (if you have MyChart) OR A paper copy in the mail If you have any lab test that is abnormal or we need to change your treatment, we will call you to review the results.   Testing/Procedures: - none ordered   Follow-Up: At [facility], you and your health needs are our priority.  As part of our continuing mission to provide you with exceptional heart care, we have created designated Provider Care Teams.  These Care Teams include your primary Cardiologist (physician) and Advanced Practice Providers (APPs -  Physician Assistants and Nurse Practitioners) who all work together to provide you with the care you need, when you need it.  We recommend signing up for the patient portal called "MyChart".  Sign up information is provided on this After Visit Summary.  MyChart is used to connect with patients for Virtual Visits (Telemedicine).  Patients are able to view lab/test results, encounter notes, upcoming appointments, etc.  Non-urgent messages can be sent to your provider as well.   To learn more about what you can do with MyChart, go to [email or web address].    Your next appointment:   1 year(s)  The format for your next appointment:   In Person  Provider:   Virl Axe, MD   Other Instructions N/a

## 2020-12-10 NOTE — Progress Notes (Signed)
Patient ID: Carlos Crosby, male   DOB: 28-Aug-1955, 65 y.o.   MRN: [id number]      Patient Care Team: Leone Haven, MD as PCP - General (Family Medicine) Rockey Situ Kathlene November, MD as Consulting Physician (Cardiology)   HPI  Carlos Crosby is a 65 y.o. male Seen in follow-up because of permanent atrial fibrillation dating back to 2014. He declined catheter ablation at [facility] at that time. A strategy of rate control had been pursued; using a combination of verapamil, metoprolol and digoxin Previous cardiomyopathy with interval improvement   Has new dog, macey, walks him 2-3 d/week.    On Anticoagulation;  without bleeding  getting now from [place]   AFib HR by Kardia 80's almost always at rest     [place] patient denies chest pain, shortness of breath, nocturnal dyspnea, orthopnea or peripheral edema.  There have been no palpitations, lightheadedness or syncope.     He has Knee unloaders on to help with pain and so he is able to be physical   He notes he know he have gain the weight back that he lost due to the pandemic  He notes he will began his weight loss regime again with his wife's help   He swim 2-3 times a week to stay active    DATE TEST EF   12/16    Echo   30-35 %   6/17    Echo  45-50 %   7/17 Myoview  42% No ischemia  10/18 Echo   40-45%   5/19 Echo  50-55%         DATE TEST Mean HR   2/16    Holter 93 (52-185)      6/17    Holter 75(38-132)         Date Cr K Hgb Dig  1/17     1.1  8/18 0.99 4.6  0.5 (10/18)   1/19 0.90 4.6 16.7   12/19 0.87 4.4 15.8 1.6  9/20 0.85 4.5 17.1 0.6 (6/20)  5/22 0.97 4.3 18.0/(6/22)17.4 0.5 (9/21)   Past Medical History:  Diagnosis Date   Chronic a-fib (HCC)    Chronic systolic CHF (congestive heart failure) (HCC)    Depression    Hemochromatosis    History of kidney stones    History of pulmonary embolism    Hyperlipidemia    Hypertension    Migraine    Obesity    OSA (obstructive sleep apnea)    on CPAP    Osteoarthritis    bilateral knees   Rocky Mountain spotted fever 2011    Past Surgical History:  Procedure Laterality Date   CARDIAC CATHETERIZATION  2012   [facility]   KNEE SURGERY     right knee    LITHOTRIPSY  07/31/2014   URETERAL STENT PLACEMENT  2016   URETEROSCOPY      Current Outpatient Medications  Medication Sig Dispense Refill   traMADol (ULTRAM) 50 MG tablet TAKE 1 TABLET(50 MG) BY MOUTH EVERY 6 HOURS AS NEEDED FOR PAIN 90 tablet 0   acetaminophen (TYLENOL) 500 MG tablet Take 1,000 mg by mouth every 6 (six) hours as needed.     benzonatate (TESSALON PERLES) 100 MG capsule Take 1 capsule (100 mg total) by mouth 3 (three) times daily as needed for cough. 20 capsule 0   Cholecalciferol (VITAMIN D) 2000 units CAPS daily.      digoxin (LANOXIN) 0.125 MG tablet TAKE 1 TABLET SIX DAYS A WEEK AS  DIRECTED 78 tablet 2   ELIQUIS 5 MG TABS tablet TAKE 1 TABLET TWICE DAILY 180 tablet 1   furosemide (LASIX) 40 MG tablet Take 40 mg by mouth daily as needed.      lisinopril (ZESTRIL) 2.5 MG tablet Take 1 tablet (2.5 mg total) by mouth daily. 90 tablet 1   loratadine (CLARITIN) 10 MG tablet Take 10 mg by mouth daily.     metoprolol succinate (TOPROL-XL) 100 MG 24 hr tablet TAKE 2 TABLETS EVERY MORNING  AND TAKE 1 TABLET EVERY EVENING 270 tablet 3   Multiple Vitamins-Minerals (CENTRUM SILVER ADULT 50+) TABS Take 1 tablet by mouth daily.     Potassium 95 MG TABS Take 1 tablet by mouth.     pravastatin (PRAVACHOL) 80 MG tablet Take 1 tablet (80 mg total) by mouth every evening. 90 tablet 3   verapamil (CALAN-SR) 120 MG CR tablet TAKE 1 TABLET EVERY DAY 90 tablet 1   No current facility-administered medications for this visit.    Allergies  Allergen Reactions   Allopurinol Hives and Swelling   Diltiazem Itching and Rash   Ondansetron Rash   Penicillins Other (See Comments) and Nausea Only    Pt unsure of rxn; showed up on allergy test Pt unsure of rxn; showed up on allergy test Pt unsure  of rxn; showed up on allergy test And dirrhea    Review of Systems negative except from HPI and PMH  Physical Exam: BP 110/80 (BP Location: Right Arm, Patient Position: Sitting, Cuff Size: Large)   Pulse 87   Ht 5\' 11"  (1.803 m)   Wt (!) 339 lb (153.8 kg)   SpO2 97%   BMI 47.28 kg/m  Well developed and Morbidly obese in no acute distress HENT normal Neck supple with JVP-flat Lungs Clear Irregular rate and rhythm, No gallop No / murmur Abd-soft with active BS No Clubbing cyanosis   No edema Skin-warm and dry A & Oriented  Grossly normal sensory and motor function  ECG: afib    Assessment and  Plan:   Afib  now controlled ventricular response  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 Continue lasix 40 mg.  Electrolytes are stable.     I,Stephanie Williams,acting as a Education administrator for Virl Axe, MD.,have documented all relevant documentation on the behalf of Virl Axe, MD,as directed by  Virl Axe, MD while in the presence of Virl Axe, MD.  I, Virl Axe, MD, have reviewed all documentation for this visit. The documentation on 12/10/20 for the exam, diagnosis, procedures, and orders are all accurate and complete.

## 2020-12-13 ENCOUNTER — Other Ambulatory Visit: Payer: Self-pay | Admitting: Family Medicine

## 2020-12-14 NOTE — Telephone Encounter (Signed)
RX Refill:tramadol Last Seen:10-09-20 Last ordered:11-06-20

## 2020-12-22 ENCOUNTER — Encounter: Payer: Self-pay | Admitting: Orthopaedic Surgery

## 2020-12-22 ENCOUNTER — Telehealth: Payer: Self-pay

## 2020-12-22 NOTE — Telephone Encounter (Signed)
Please precert for bilateral visco injections.  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 Thanks!

## 2020-12-24 NOTE — Telephone Encounter (Signed)
Noted  

## 2020-12-28 ENCOUNTER — Encounter: Payer: Self-pay | Admitting: Family Medicine

## 2021-01-04 DIAGNOSIS — G4730 Sleep apnea, unspecified: Secondary | ICD-10-CM | POA: Diagnosis not present

## 2021-01-04 DIAGNOSIS — G4733 Obstructive sleep apnea (adult) (pediatric): Secondary | ICD-10-CM | POA: Diagnosis not present

## 2021-01-15 ENCOUNTER — Telehealth: Payer: Self-pay

## 2021-01-15 NOTE — Telephone Encounter (Signed)
Submitted VOB for Orthovisc, bilateral knee. Pending BV.  Next available gel injection is after 01/25/2021

## 2021-01-19 ENCOUNTER — Other Ambulatory Visit: Payer: Self-pay | Admitting: Family Medicine

## 2021-01-19 NOTE — Telephone Encounter (Signed)
Refill sent to pharmacy.  He is due for follow-up in the next month or so.  Please contact him to get him scheduled.

## 2021-02-01 ENCOUNTER — Encounter: Payer: Self-pay | Admitting: Nurse Practitioner

## 2021-02-01 ENCOUNTER — Telehealth: Payer: Self-pay

## 2021-02-01 NOTE — Telephone Encounter (Signed)
Patient will CB to schedule for series of 3 for Orthovisc, bilateral knee with Dr. Durward Fortes.  Approved for Orthovisc, bilateral knee. [place] Patient will be responsible for 20% OOP. No Co-pay PA Approval# [id number] Valid 01/29/2021- 08/01/2020

## 2021-02-03 ENCOUNTER — Other Ambulatory Visit: Payer: Self-pay | Admitting: Internal Medicine

## 2021-02-04 DIAGNOSIS — G4730 Sleep apnea, unspecified: Secondary | ICD-10-CM | POA: Diagnosis not present

## 2021-02-04 DIAGNOSIS — G4733 Obstructive sleep apnea (adult) (pediatric): Secondary | ICD-10-CM | POA: Diagnosis not present

## 2021-02-08 ENCOUNTER — Encounter: Payer: Self-pay | Admitting: Family Medicine

## 2021-02-09 ENCOUNTER — Encounter: Payer: Self-pay | Admitting: Orthopaedic Surgery

## 2021-02-09 ENCOUNTER — Other Ambulatory Visit: Payer: Self-pay

## 2021-02-09 ENCOUNTER — Ambulatory Visit (INDEPENDENT_AMBULATORY_CARE_PROVIDER_SITE_OTHER): Payer: Medicare HMO | Admitting: Orthopaedic Surgery

## 2021-02-09 DIAGNOSIS — M170 Bilateral primary osteoarthritis of knee: Secondary | ICD-10-CM | POA: Diagnosis not present

## 2021-02-09 MED ORDER — HYALURONAN 30 MG/2ML IX SOSY
30.0000 mg | PREFILLED_SYRINGE | INTRA_ARTICULAR | Status: AC | PRN
  Administered 2021-02-09: 30 mg via INTRA_ARTICULAR

## 2021-02-09 NOTE — Progress Notes (Signed)
Office Visit Note   Patient: Carlos Crosby           Date of Birth: 1955-12-31           MRN: [id number] Visit Date: 02/09/2021              Requested by: Leone Haven, MD [address] [place],  [place] [address] PCP: Leone Haven, MD   Assessment & Plan: Visit Diagnoses:  1. Morbid (severe) obesity due to excess calories ([place])   2. Primary osteoarthritis of both knees     Plan: First Orthovisc injection both knees.  Return weekly for the next 2 weeks to complete the series.  Has had similar injections of viscosupplementation in the past with good results.  Working on his weight which certainly should help his knees. Feels like he might have a loose body in his left knee.  I reviewed his films from 2020 and there is a large superior pouch loose body.  At some point he might be a candidate to have it removed simply because of the mechanical implications  Follow-Up Instructions: Return in about 1 week (around 02/16/2021).   Orders:  No orders of the defined types were placed in this encounter.  No orders of the defined types were placed in this encounter.     Procedures: Large Joint Inj: bilateral knee on 02/09/2021 2:33 PM Indications: pain and joint swelling Details: 25 G 1.5 in needle, anteromedial approach  Arthrogram: No  Medications (Right): 30 mg Hyaluronan 30 MG/2ML Medications (Left): 30 mg Hyaluronan 30 MG/2ML Outcome: tolerated well, no immediate complications Procedure, treatment alternatives, risks and benefits explained, specific risks discussed. Consent was given by the patient. Immediately prior to procedure a time out was called to verify the correct patient, procedure, equipment, support staff and site/side marked as required. Patient was prepped and draped in the usual sterile fashion.      Clinical Data: No additional findings.   Subjective: Chief Complaint  Patient presents with   Right Knee - Follow-up    Bilateral  orthovisc started 02/09/2021   Left Knee - Follow-up  Patient presents today for the first Orthovisc bilaterally.   HPI  Review of Systems   Objective: Vital Signs: Ht '5\' 11"'$  (1.803 m)   Wt (!) 339 lb (153.8 kg)   BMI 47.28 kg/m   Physical Exam  Ortho Exam very minimal effusion both knees.  Increased varus with weightbearing.  Lacks some degrees of full extension of flexed about 100 degrees without instability.  Had some medial and lateral joint pain.  Positive crepitation patient thinks he might have a loose body in the left knee but I could not palpate this  Specialty Comments:  No specialty comments available.  Imaging: No results found.   PMFS History: Patient Active Problem List   Diagnosis Date Noted   Cough 10/09/2020   Morbid (severe) obesity due to excess calories ([place]) 09/29/2020   Trigger finger, left ring finger 05/07/2020   Chronic pain of left hand 04/15/2020   Routine general medical examination at a health care facility 02/25/2020   Rectal pain 10/18/2019   Rash 10/08/2019   Fall 02/05/2019   Neuropathy 11/16/2017   Prediabetes 11/16/2017   Vitamin D deficiency 11/16/2017   Hemochromatosis, hereditary ([place]) 07/23/2017   Bleeding nose 05/18/2017   Allergic rhinitis 01/26/2017   Blue nevus 05/16/2016   Hair loss 05/16/2016   Abdominal pain [id number]   Umbilical hernia without obstruction and without  gangrene 05/16/2016   Atrial fibrillation ([place]) 09/26/2014   Chronic diastolic congestive heart failure ([place]) 09/26/2014   Anxiety 09/26/2014   Obstructive sleep apnea on CPAP 09/26/2014   Essential hypertension 09/26/2014   Hyperlipidemia 09/26/2014   Osteoarthritis of both knees 09/26/2014   Kidney stone 09/26/2014   Past Medical History:  Diagnosis Date   Chronic a-fib (HCC)    Chronic systolic CHF (congestive heart failure) (HCC)    Depression    Hemochromatosis    History of kidney stones    History of pulmonary embolism    Hyperlipidemia     Hypertension    Migraine    Obesity    OSA (obstructive sleep apnea)    on CPAP   Osteoarthritis    bilateral knees   Rocky [place] spotted fever 2011    Family History  Problem Relation Age of Onset   Thyroid disease Mother    Cancer Father        lung   Arthritis Father    Hypertension Father    Cancer Paternal Grandfather        Throat cancer    Past Surgical History:  Procedure Laterality Date   CARDIAC CATHETERIZATION  2012   [facility]   KNEE SURGERY     right knee    LITHOTRIPSY  07/31/2014   URETERAL STENT PLACEMENT  2016   URETEROSCOPY     Social History   Occupational History   Not on file  Tobacco Use   Smoking status: Never   Smokeless tobacco: Never  Vaping Use   Vaping Use: Never used  Substance and Sexual Activity   Alcohol use: No    Comment: occasional; 3 times a year   Drug use: No   Sexual activity: Not Currently

## 2021-02-10 ENCOUNTER — Inpatient Hospital Stay: Payer: Medicare HMO | Attending: Nurse Practitioner

## 2021-02-10 DIAGNOSIS — Z7901 Long term (current) use of anticoagulants: Secondary | ICD-10-CM | POA: Insufficient documentation

## 2021-02-10 DIAGNOSIS — I110 Hypertensive heart disease with heart failure: Secondary | ICD-10-CM | POA: Diagnosis not present

## 2021-02-10 DIAGNOSIS — Z79899 Other long term (current) drug therapy: Secondary | ICD-10-CM | POA: Diagnosis not present

## 2021-02-10 DIAGNOSIS — G4733 Obstructive sleep apnea (adult) (pediatric): Secondary | ICD-10-CM | POA: Insufficient documentation

## 2021-02-10 DIAGNOSIS — Z86711 Personal history of pulmonary embolism: Secondary | ICD-10-CM | POA: Diagnosis not present

## 2021-02-10 DIAGNOSIS — I5022 Chronic systolic (congestive) heart failure: Secondary | ICD-10-CM | POA: Diagnosis not present

## 2021-02-10 DIAGNOSIS — D751 Secondary polycythemia: Secondary | ICD-10-CM | POA: Diagnosis not present

## 2021-02-10 LAB — CBC WITH DIFFERENTIAL/PLATELET
Abs Immature Granulocytes: 0.03 10*3/uL (ref 0.00–0.07)
Basophils Absolute: 0.1 10*3/uL (ref 0.0–0.1)
Basophils Relative: 1 %
Eosinophils Absolute: 0.2 10*3/uL (ref 0.0–0.5)
Eosinophils Relative: 2 %
HCT: 51.3 % (ref 39.0–52.0)
Hemoglobin: 18.2 g/dL — ABNORMAL HIGH (ref 13.0–17.0)
Immature Granulocytes: 0 %
Lymphocytes Relative: 36 %
Lymphs Abs: 2.5 10*3/uL (ref 0.7–4.0)
MCH: 33.9 pg (ref 26.0–34.0)
MCHC: 35.5 g/dL (ref 30.0–36.0)
MCV: 95.5 fL (ref 80.0–100.0)
Monocytes Absolute: 0.5 10*3/uL (ref 0.1–1.0)
Monocytes Relative: 8 %
Neutro Abs: 3.6 10*3/uL (ref 1.7–7.7)
Neutrophils Relative %: 53 %
Platelets: 202 10*3/uL (ref 150–400)
RBC: 5.37 MIL/uL (ref 4.22–5.81)
RDW: 12.2 % (ref 11.5–15.5)
WBC: 6.9 10*3/uL (ref 4.0–10.5)
nRBC: 0 % (ref 0.0–0.2)

## 2021-02-10 LAB — IRON AND TIBC
Iron: 182 ug/dL (ref 45–182)
Saturation Ratios: 44 % — ABNORMAL HIGH (ref 17.9–39.5)
TIBC: 416 ug/dL (ref 250–450)
UIBC: 234 ug/dL

## 2021-02-10 LAB — FERRITIN: Ferritin: 94 ng/mL (ref 24–336)

## 2021-02-11 ENCOUNTER — Encounter: Payer: Self-pay | Admitting: Nurse Practitioner

## 2021-02-11 ENCOUNTER — Inpatient Hospital Stay: Payer: Medicare HMO

## 2021-02-11 ENCOUNTER — Other Ambulatory Visit: Payer: Medicare HMO

## 2021-02-11 ENCOUNTER — Inpatient Hospital Stay (HOSPITAL_BASED_OUTPATIENT_CLINIC_OR_DEPARTMENT_OTHER): Payer: Medicare HMO | Admitting: Nurse Practitioner

## 2021-02-11 DIAGNOSIS — G4733 Obstructive sleep apnea (adult) (pediatric): Secondary | ICD-10-CM | POA: Diagnosis not present

## 2021-02-11 DIAGNOSIS — Z79899 Other long term (current) drug therapy: Secondary | ICD-10-CM | POA: Diagnosis not present

## 2021-02-11 DIAGNOSIS — I110 Hypertensive heart disease with heart failure: Secondary | ICD-10-CM | POA: Diagnosis not present

## 2021-02-11 DIAGNOSIS — I5022 Chronic systolic (congestive) heart failure: Secondary | ICD-10-CM | POA: Diagnosis not present

## 2021-02-11 DIAGNOSIS — Z86711 Personal history of pulmonary embolism: Secondary | ICD-10-CM | POA: Diagnosis not present

## 2021-02-11 DIAGNOSIS — D751 Secondary polycythemia: Secondary | ICD-10-CM | POA: Diagnosis not present

## 2021-02-11 DIAGNOSIS — Z7901 Long term (current) use of anticoagulants: Secondary | ICD-10-CM | POA: Diagnosis not present

## 2021-02-11 NOTE — Progress Notes (Signed)
Pt states he feels well over all. Has "bad knees". Denies unusual fatigue. Here today for possible phlebotomy. Pt drinks 100 ml water daily. This has prevented gout and kidney stones for him.

## 2021-02-11 NOTE — Progress Notes (Signed)
Therapeutic phlebotomy performed; removed 400 ml blood per orders from Beckey Rutter NP. Pt tolerated procedure well. Accepted a beverage . VSS. Discharged to home after an observation period.

## 2021-02-11 NOTE — Progress Notes (Signed)
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  INTERVAL HISTORY: Patient is 65 year old male who returns to clinic for repeat labs, further evaluation, and consideration of additional phlebotomy.  He feels well and is asymptomatic.  He has no neurologic complaints.  No recent fevers or illness.  He denies chest pain, shortness of breath, cough, hemoptysis.  He denies nausea, vomiting, constipation, diarrhea.  He denies urinary complaints.  No further specific complaints today.  REVIEW OF SYSTEMS:   Review of Systems  Constitutional: Negative.  Negative for fever, malaise/fatigue and weight loss.  Respiratory: Negative.  Negative for cough, hemoptysis and shortness of breath.   Cardiovascular: Negative.  Negative for chest pain and leg swelling.  Gastrointestinal: Negative.  Negative for abdominal pain, blood in stool and melena.  Genitourinary: Negative.  Negative for hematuria.  Musculoskeletal: Negative.  Negative for back pain.  Skin: Negative.  Negative for rash.  Neurological: Negative.  Negative for sensory change, focal weakness, weakness and headaches.  Psychiatric/Behavioral: Negative.  The patient is not nervous/anxious.   As per HPI. Otherwise, a complete review of systems is negative.  PAST MEDICAL HISTORY: Past Medical History:  Diagnosis Date   Chronic a-fib (HCC)    Chronic systolic CHF (congestive heart failure) (HCC)    Depression    Hemochromatosis    History of kidney stones    History of pulmonary embolism    Hyperlipidemia    Hypertension    Migraine    Obesity    OSA (obstructive sleep apnea)    on CPAP   Osteoarthritis    bilateral  knees   Rocky [place] spotted fever 2011    PAST SURGICAL HISTORY: Past Surgical History:  Procedure Laterality Date   CARDIAC CATHETERIZATION  2012   [facility]   KNEE SURGERY     right knee    LITHOTRIPSY  07/31/2014   URETERAL STENT PLACEMENT  2016   URETEROSCOPY      FAMILY HISTORY: Family History  Problem Relation Age of Onset   Thyroid disease Mother    Cancer Father        lung   Arthritis Father    Hypertension Father    Cancer Paternal Grandfather        Throat cancer    ADVANCED DIRECTIVES (Y/N):  N  HEALTH MAINTENANCE: Social History   Tobacco Use   Smoking status: Never   Smokeless tobacco: Never  Vaping Use   Vaping Use: Never used  Substance Use Topics   Alcohol use: No    Comment: occasional; 3 times a year   Drug use: No     Colonoscopy:  PAP:  Bone density:  Lipid panel:  Allergies  Allergen Reactions   Allopurinol Hives and Swelling   Diltiazem Itching and Rash   Ondansetron Rash   Penicillins Other (See Comments) and Nausea Only    Pt unsure of rxn; showed up on allergy test Pt unsure of rxn; showed up on allergy test Pt unsure of rxn; showed up on allergy test And dirrhea     Current Outpatient Medications  Medication Sig Dispense Refill   acetaminophen (TYLENOL) 500 MG tablet Take 1,000 mg by mouth every 6 (six) hours as needed.  Cholecalciferol (VITAMIN D) 2000 units CAPS daily.      digoxin (LANOXIN) 0.125 MG tablet TAKE 1 TABLET SIX DAYS A WEEK AS DIRECTED 78 tablet 2   ELIQUIS 5 MG TABS tablet TAKE 1 TABLET TWICE DAILY 180 tablet 1   lisinopril (ZESTRIL) 2.5 MG tablet TAKE 1 TABLET EVERY DAY 90 tablet 3   loratadine (CLARITIN) 10 MG tablet Take 10 mg by mouth daily.     metoprolol succinate (TOPROL-XL) 100 MG 24 hr tablet TAKE 2 TABLETS EVERY MORNING  AND TAKE 1 TABLET EVERY EVENING 270 tablet 3   Multiple Vitamins-Minerals (CENTRUM SILVER ADULT 50+) TABS Take 1 tablet by mouth daily.     Potassium 95 MG TABS Take 1 tablet  by mouth.     pravastatin (PRAVACHOL) 80 MG tablet Take 1 tablet (80 mg total) by mouth every evening. 90 tablet 3   traMADol (ULTRAM) 50 MG tablet TAKE 1 TABLET(50 MG) BY MOUTH EVERY 6 HOURS AS NEEDED FOR PAIN 90 tablet 0   verapamil (CALAN-SR) 120 MG CR tablet TAKE 1 TABLET EVERY DAY 90 tablet 1   No current facility-administered medications for this visit.    OBJECTIVE: Vitals:   02/11/21 1334  BP: 127/88  Pulse: 83  Resp: 18  Temp: (!) 97.3 F (36.3 C)  SpO2: 97%     Body mass index is 47.14 kg/m.    ECOG FS:0 - Asymptomatic  General: Well-developed, well-nourished, no acute distress. Eyes: Pink conjunctiva, anicteric sclera. Lungs: Clear to auscultation bilaterally.  No audible wheezing or coughing Heart: Regular rate and rhythm.  Abdomen: Soft, nontender, nondistended.  Musculoskeletal: No edema, cyanosis, or clubbing. Neuro: Alert, answering all questions appropriately. Cranial nerves grossly intact. Skin: No rashes or petechiae noted. Psych: Normal affect.   LAB RESULTS:  Lab Results  Component Value Date   NA 136 10/11/2020   K 4.3 10/11/2020   CL 102 10/11/2020   CO2 23 10/11/2020   GLUCOSE 123 (H) 10/11/2020   BUN 28 (H) 10/11/2020   CREATININE 0.97 10/11/2020   CALCIUM 9.3 10/11/2020   PROT 7.8 10/11/2020   ALBUMIN 4.4 10/11/2020   AST 21 10/11/2020   ALT 26 10/11/2020   ALKPHOS 51 10/11/2020   BILITOT 1.3 (H) 10/11/2020   GFRNONAA >60 10/11/2020   GFRAA 107 09/05/2019    Lab Results  Component Value Date   WBC 6.9 02/10/2021   NEUTROABS 3.6 02/10/2021   HGB 18.2 (H) 02/10/2021   HCT 51.3 02/10/2021   MCV 95.5 02/10/2021   PLT 202 02/10/2021   Lab Results  Component Value Date   IRON 182 02/10/2021   TIBC 416 02/10/2021   IRONPCTSAT 44 (H) 02/10/2021   Lab Results  Component Value Date   FERRITIN 94 02/10/2021    STUDIES: No results found.  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  Patient's hemoglobin and ferritin up trended though improved compared to previous. Today, hemoglobin 18.2, hematocrit 51.3.  Ferritin 94.  High iron saturation at 44%.  Recommend phlebotomy today 500 ml.   Return to clinic in 3 months for labs and phlebotomy only then in 6 months with repeat labs, further evaluation, and continuation of treatment.  Ideally patient will have labs today before his visit.  Patient is unable to get his phlebotomies performed through one blood due to continuation of Eliquis.  2.  Weight loss: Patient  now has a slight weight gain.  3.  Polycythemia: Likely secondary to elevated iron stores.  Phlebotomy as above.  I spent a total of 20 minutes reviewing chart data, face-to-face evaluation with the patient, counseling and coordination of care as detailed above.  Patient expressed understanding and was in agreement with this plan. He also understands that He can call clinic at any time with any questions, concerns, or complaints.    Verlon Au, NP   02/11/2021 3:38 PM

## 2021-02-12 ENCOUNTER — Other Ambulatory Visit: Payer: Medicare HMO

## 2021-02-12 ENCOUNTER — Ambulatory Visit: Payer: Medicare HMO | Admitting: Oncology

## 2021-02-12 ENCOUNTER — Encounter: Payer: Self-pay | Admitting: Nurse Practitioner

## 2021-02-16 ENCOUNTER — Encounter: Payer: Self-pay | Admitting: Orthopaedic Surgery

## 2021-02-16 ENCOUNTER — Other Ambulatory Visit: Payer: Self-pay

## 2021-02-16 ENCOUNTER — Ambulatory Visit (INDEPENDENT_AMBULATORY_CARE_PROVIDER_SITE_OTHER): Payer: Medicare HMO | Admitting: Orthopaedic Surgery

## 2021-02-16 DIAGNOSIS — M170 Bilateral primary osteoarthritis of knee: Secondary | ICD-10-CM

## 2021-02-16 MED ORDER — HYALURONAN 30 MG/2ML IX SOSY
30.0000 mg | PREFILLED_SYRINGE | INTRA_ARTICULAR | Status: AC | PRN
  Administered 2021-02-16: 30 mg via INTRA_ARTICULAR

## 2021-02-16 NOTE — Progress Notes (Signed)
Office Visit Note   Patient: Carlos Crosby           Date of Birth: Jun 30, 1955           MRN: [id number] Visit Date: 02/16/2021              Requested by: Leone Haven, MD [address] [place],  [place] [address] PCP: Leone Haven, MD   Assessment & Plan: Visit Diagnoses:  1. Primary osteoarthritis of both knees     Plan: Second Orthovisc injection both knees.  No problems with the first.  Return next week to complete the series of 3.  He does have a large loose body in his left knee and we might want to consider removing it.  He is also in the midst of a weight loss program  Follow-Up Instructions: Return in about 1 week (around 02/23/2021).   Orders:  No orders of the defined types were placed in this encounter.  No orders of the defined types were placed in this encounter.     Procedures: Large Joint Inj: bilateral knee on 02/16/2021 2:48 PM Indications: pain and joint swelling Details: 25 G 1.5 in needle, anteromedial approach  Arthrogram: No  Medications (Right): 30 mg Hyaluronan 30 MG/2ML Medications (Left): 30 mg Hyaluronan 30 MG/2ML Outcome: tolerated well, no immediate complications Procedure, treatment alternatives, risks and benefits explained, specific risks discussed. Consent was given by the patient. Immediately prior to procedure a time out was called to verify the correct patient, procedure, equipment, support staff and site/side marked as required. Patient was prepped and draped in the usual sterile fashion.      Clinical Data: No additional findings.   Subjective: Chief Complaint  Patient presents with   Right Knee - Follow-up    Bilateral Orthovisc #2   Left Knee - Follow-up  Patient presents today for the second orthovisc bilaterally.   HPI  Review of Systems   Objective: Vital Signs: There were no vitals taken for this visit.  Physical Exam  Ortho Exam neither knee was hot red warm or swollen.  He might have a  small effusion in both knees.  Mostly medial joint pain  Specialty Comments:  No specialty comments available.  Imaging: No results found.   PMFS History: Patient Active Problem List   Diagnosis Date Noted   Cough 10/09/2020   Morbid (severe) obesity due to excess calories ([place]) 09/29/2020   Trigger finger, left ring finger 05/07/2020   Chronic pain of left hand 04/15/2020   Routine general medical examination at a health care facility 02/25/2020   Rectal pain 10/18/2019   Rash 10/08/2019   Fall 02/05/2019   Neuropathy 11/16/2017   Prediabetes 11/16/2017   Vitamin D deficiency 11/16/2017   Hemochromatosis, hereditary ([place]) 07/23/2017   Bleeding nose 05/18/2017   Allergic rhinitis 01/26/2017   Blue nevus 05/16/2016   Hair loss 05/16/2016   Abdominal pain [id number]   Umbilical hernia without obstruction and without gangrene 05/16/2016   Atrial fibrillation ([place]) 09/26/2014   Chronic diastolic congestive heart failure ([place]) 09/26/2014   Anxiety 09/26/2014   Obstructive sleep apnea on CPAP 09/26/2014   Essential hypertension 09/26/2014   Hyperlipidemia 09/26/2014   Osteoarthritis of both knees 09/26/2014   Kidney stone 09/26/2014   Past Medical History:  Diagnosis Date   Chronic a-fib (HCC)    Chronic systolic CHF (congestive heart failure) (HCC)    Depression    Hemochromatosis    History of kidney stones  History of pulmonary embolism    Hyperlipidemia    Hypertension    Migraine    Obesity    OSA (obstructive sleep apnea)    on CPAP   Osteoarthritis    bilateral knees   Rocky Mountain spotted fever 2011    Family History  Problem Relation Age of Onset   Thyroid disease Mother    Cancer Father        lung   Arthritis Father    Hypertension Father    Cancer Paternal Grandfather        Throat cancer    Past Surgical History:  Procedure Laterality Date   CARDIAC CATHETERIZATION  2012   [facility]   KNEE SURGERY     right knee    LITHOTRIPSY  07/31/2014    URETERAL STENT PLACEMENT  2016   URETEROSCOPY     Social History   Occupational History   Not on file  Tobacco Use   Smoking status: Never   Smokeless tobacco: Never  Vaping Use   Vaping Use: Never used  Substance and Sexual Activity   Alcohol use: No    Comment: occasional; 3 times a year   Drug use: No   Sexual activity: Not Currently

## 2021-02-18 ENCOUNTER — Encounter: Payer: Self-pay | Admitting: Family Medicine

## 2021-02-22 ENCOUNTER — Other Ambulatory Visit: Payer: Self-pay | Admitting: Family Medicine

## 2021-02-23 ENCOUNTER — Encounter: Payer: Self-pay | Admitting: Orthopaedic Surgery

## 2021-02-23 ENCOUNTER — Other Ambulatory Visit: Payer: Self-pay

## 2021-02-23 ENCOUNTER — Ambulatory Visit (INDEPENDENT_AMBULATORY_CARE_PROVIDER_SITE_OTHER): Payer: Medicare HMO | Admitting: Orthopaedic Surgery

## 2021-02-23 DIAGNOSIS — M170 Bilateral primary osteoarthritis of knee: Secondary | ICD-10-CM | POA: Diagnosis not present

## 2021-02-23 MED ORDER — HYALURONAN 30 MG/2ML IX SOSY
30.0000 mg | PREFILLED_SYRINGE | INTRA_ARTICULAR | Status: AC | PRN
  Administered 2021-02-23: 30 mg via INTRA_ARTICULAR

## 2021-02-23 NOTE — Progress Notes (Signed)
Office Visit Note   Patient: Carlos Crosby           Date of Birth: 1956-05-04           MRN: [id number] Visit Date: 02/23/2021              Requested by: Leone Haven, MD [address] [place],  [place] [address] PCP: Leone Haven, MD   Assessment & Plan: Visit Diagnoses:  1. Primary osteoarthritis of both knees     Plan: Third and final Orthovisc injection both knees.  Doing well.  Feels like he has had less swelling.  Also has lost weight which I am sure will help.  We have discussed a loose body in his left knee and he like to think about having that removed at some point.  Asked him to wait at least a month  Follow-Up Instructions: Return if symptoms worsen or fail to improve.   Orders:  No orders of the defined types were placed in this encounter.  No orders of the defined types were placed in this encounter.     Procedures: Large Joint Inj: bilateral knee on 02/23/2021 3:29 PM Indications: pain and joint swelling Details: 25 G 1.5 in needle, anteromedial approach  Arthrogram: No  Medications (Right): 30 mg Hyaluronan 30 MG/2ML Medications (Left): 30 mg Hyaluronan 30 MG/2ML Outcome: tolerated well, no immediate complications Procedure, treatment alternatives, risks and benefits explained, specific risks discussed. Consent was given by the patient. Immediately prior to procedure a time out was called to verify the correct patient, procedure, equipment, support staff and site/side marked as required. Patient was prepped and draped in the usual sterile fashion.      Clinical Data: No additional findings.   Subjective: Chief Complaint  Patient presents with   Right Knee - Follow-up    Bilateral orthovisc started 02/09/2021   Left Knee - Follow-up  Patient presents today for the third Orthovisc bilaterally. He started the series on 02/09/2021.  HPI  Review of Systems   Objective: Vital Signs: There were no vitals taken for this  visit.  Physical Exam  Ortho Exam very minimal swelling of the knees.  The knees were not hot warm or red and minimal joint tenderness  Specialty Comments:  No specialty comments available.  Imaging: No results found.   PMFS History: Patient Active Problem List   Diagnosis Date Noted   Cough 10/09/2020   Morbid (severe) obesity due to excess calories ([place]) 09/29/2020   Trigger finger, left ring finger 05/07/2020   Chronic pain of left hand 04/15/2020   Routine general medical examination at a health care facility 02/25/2020   Rectal pain 10/18/2019   Rash 10/08/2019   Fall 02/05/2019   Neuropathy 11/16/2017   Prediabetes 11/16/2017   Vitamin D deficiency 11/16/2017   Hemochromatosis, hereditary ([place]) 07/23/2017   Bleeding nose 05/18/2017   Allergic rhinitis 01/26/2017   Blue nevus 05/16/2016   Hair loss 05/16/2016   Abdominal pain 25/95/6387   Umbilical hernia without obstruction and without gangrene 05/16/2016   Atrial fibrillation ([place]) 09/26/2014   Chronic diastolic congestive heart failure ([place]) 09/26/2014   Anxiety 09/26/2014   Obstructive sleep apnea on CPAP 09/26/2014   Essential hypertension 09/26/2014   Hyperlipidemia 09/26/2014   Osteoarthritis of both knees 09/26/2014   Kidney stone 09/26/2014   Past Medical History:  Diagnosis Date   Chronic a-fib (HCC)    Chronic systolic CHF (congestive heart failure) ([place])    Depression  Hemochromatosis    History of kidney stones    History of pulmonary embolism    Hyperlipidemia    Hypertension    Migraine    Obesity    OSA (obstructive sleep apnea)    on CPAP   Osteoarthritis    bilateral knees   Rocky [place] spotted fever 2011    Family History  Problem Relation Age of Onset   Thyroid disease Mother    Cancer Father        lung   Arthritis Father    Hypertension Father    Cancer Paternal Grandfather        Throat cancer    Past Surgical History:  Procedure Laterality Date   CARDIAC  CATHETERIZATION  2012   [facility]   KNEE SURGERY     right knee    LITHOTRIPSY  07/31/2014   URETERAL STENT PLACEMENT  2016   URETEROSCOPY     Social History   Occupational History   Not on file  Tobacco Use   Smoking status: Never   Smokeless tobacco: Never  Vaping Use   Vaping Use: Never used  Substance and Sexual Activity   Alcohol use: No    Comment: occasional; 3 times a year   Drug use: No   Sexual activity: Not Currently

## 2021-03-03 ENCOUNTER — Other Ambulatory Visit: Payer: Self-pay | Admitting: *Deleted

## 2021-03-03 DIAGNOSIS — N200 Calculus of kidney: Secondary | ICD-10-CM

## 2021-03-06 DIAGNOSIS — G4733 Obstructive sleep apnea (adult) (pediatric): Secondary | ICD-10-CM | POA: Diagnosis not present

## 2021-03-06 DIAGNOSIS — G4730 Sleep apnea, unspecified: Secondary | ICD-10-CM | POA: Diagnosis not present

## 2021-03-08 DIAGNOSIS — G4733 Obstructive sleep apnea (adult) (pediatric): Secondary | ICD-10-CM | POA: Diagnosis not present

## 2021-03-15 DIAGNOSIS — G4730 Sleep apnea, unspecified: Secondary | ICD-10-CM | POA: Diagnosis not present

## 2021-03-15 DIAGNOSIS — G4733 Obstructive sleep apnea (adult) (pediatric): Secondary | ICD-10-CM | POA: Diagnosis not present

## 2021-03-16 ENCOUNTER — Ambulatory Visit
Admission: RE | Admit: 2021-03-16 | Discharge: 2021-03-16 | Disposition: A | Discharge status: Home / Self Care | Payer: Medicare HMO | Attending: Urology | Admitting: Urology

## 2021-03-16 ENCOUNTER — Other Ambulatory Visit: Payer: Self-pay

## 2021-03-16 ENCOUNTER — Ambulatory Visit
Admission: RE | Admit: 2021-03-16 | Discharge: 2021-03-16 | Disposition: A | Discharge status: Home / Self Care | Payer: Medicare HMO | Source: Ambulatory Visit | Attending: Urology | Admitting: Urology

## 2021-03-16 DIAGNOSIS — N200 Calculus of kidney: Secondary | ICD-10-CM | POA: Diagnosis not present

## 2021-03-22 NOTE — Progress Notes (Signed)
03/23/21 9:23 AM   Carlos Crosby 01/17/1956 [id number]  Referring provider:  Leone Haven, MD [address] [place],  [place] [address] Chief Complaint  Patient presents with   Nephrolithiasis     HPI: Carlos Crosby is a 65 y.o.male with a personal history of nephrolithiasis who returns today for follow-up with KUB.   He was last seen in clinic on 03/19/2019.   Previous stone analysis from 2016 showed 79% calcium oxalate monohydrate, 10% calcium oxalate dihydrate, 15% calcium phosphate.   He underwent 59-FMBW urine metabolic work-up which was fairly unremarkable.   He has underwent multiple stone procedures in the past including PCNL, ureteroscopy and ESWL.   03/14/2021 KUB revealed a few small calculi in the inferior pole of the left kidney measuring up to 4 mm with no definite right renal calculi.   Recent KUB on 03/18/2021 revealed left nephrolithiasis with stones either more confluent or overlapping compared with prior KUB, they currently measure 9 mm.   He reports today that he has experienced pain that felt like a kidney stone in the past 2 years but only twice on isolated occations. He reports that he has increased his water intake and has seen improvement on his health, he is congratulated on this and encouraged to continue.      PMH: Past Medical History:  Diagnosis Date   Chronic a-fib (HCC)    Chronic systolic CHF (congestive heart failure) (HCC)    Depression    Hemochromatosis    History of kidney stones    History of pulmonary embolism    Hyperlipidemia    Hypertension    Migraine    Obesity    OSA (obstructive sleep apnea)    on CPAP   Osteoarthritis    bilateral knees   [place] spotted fever 2011    Surgical History: Past Surgical History:  Procedure Laterality Date   CARDIAC CATHETERIZATION  2012   [facility]   KNEE SURGERY     right knee    LITHOTRIPSY  07/31/2014   URETERAL STENT PLACEMENT  2016   URETEROSCOPY       Home Medications:  Allergies as of 03/23/2021       Reactions   Allopurinol Hives, Swelling   Diltiazem Itching, Rash   Ondansetron Rash   Penicillins Other (See Comments), Nausea Only   Pt unsure of rxn; showed up on allergy test Pt unsure of rxn; showed up on allergy test Pt unsure of rxn; showed up on allergy test And dirrhea        Medication List        Accurate as of March 23, 2021  9:23 AM. If you have any questions, ask your nurse or doctor.          acetaminophen 500 MG tablet Commonly known as: TYLENOL Take 1,000 mg by mouth every 6 (six) hours as needed.   Centrum Silver Adult 50+ Tabs Take 1 tablet by mouth daily.   digoxin 0.25 MG tablet Commonly known as: LANOXIN Take by mouth. What changed: Another medication with the same name was removed. Continue taking this medication, and follow the directions you see here. Changed by: Hollice Espy, MD   Eliquis 5 MG Tabs tablet Generic drug: apixaban TAKE 1 TABLET TWICE DAILY   lisinopril 2.5 MG tablet Commonly known as: ZESTRIL TAKE 1 TABLET EVERY DAY   loratadine 10 MG tablet Commonly known as: CLARITIN Take 10 mg by mouth daily.   metoprolol  succinate 100 MG 24 hr tablet Commonly known as: TOPROL-XL TAKE 2 TABLETS EVERY MORNING  AND TAKE 1 TABLET EVERY EVENING   Potassium 95 MG Tabs Take 1 tablet by mouth.   pravastatin 80 MG tablet Commonly known as: PRAVACHOL Take 1 tablet (80 mg total) by mouth every evening.   traMADol 50 MG tablet Commonly known as: ULTRAM TAKE 1 TABLET(50 MG) BY MOUTH EVERY 6 HOURS AS NEEDED FOR PAIN   verapamil 120 MG CR tablet Commonly known as: CALAN-SR TAKE 1 TABLET EVERY DAY   Vitamin D 50 MCG (2000 UT) Caps daily.        Allergies:  Allergies  Allergen Reactions   Allopurinol Hives and Swelling   Diltiazem Itching and Rash   Ondansetron Rash   Penicillins Other (See Comments) and Nausea Only    Pt unsure of rxn; showed up on allergy  test Pt unsure of rxn; showed up on allergy test Pt unsure of rxn; showed up on allergy test And dirrhea     Family History: Family History  Problem Relation Age of Onset   Thyroid disease Mother    Cancer Father        lung   Arthritis Father    Hypertension Father    Cancer Paternal Grandfather        Throat cancer    Social History:  reports that he has never smoked. He has never used smokeless tobacco. He reports that he does not drink alcohol and does not use drugs.   Physical Exam: BP 133/86   Pulse 90   Ht 5\' 11"  (1.803 m)   Wt (!) 320 lb (145.2 kg)   BMI 44.63 kg/m   Constitutional:  Alert and oriented, No acute distress. HEENT: [place] AT, moist mucus membranes.  Trachea midline, no masses. Cardiovascular: No clubbing, cyanosis, or edema. Respiratory: Normal respiratory effort, no increased work of breathing. Skin: No rashes, bruises or suspicious lesions. Neurologic: Grossly intact, no focal deficits, moving all 4 extremities. Psychiatric: Normal mood and affect.  Laboratory Data:  Lab Results  Component Value Date   CREATININE 0.97 10/11/2020    Pertinent Imaging: CLINICAL DATA:  Left-sided kidney stone.   EXAM: ABDOMEN - 1 VIEW   COMPARISON:  Radiograph 03/15/2019.  CT 07/24/2014   FINDINGS: Calcifications projecting over the left renal shadow, more confluent than on prior exam spanning approximately 9 mm. There is a small air adjacent stone in the lower left kidney. No visualized right urolithiasis. No evidence of ureteral stone. Calcifications in the pelvis are stable from prior exam and typical of phleboliths. Normal bowel gas pattern with small to moderate colonic stool burden. No concerning intraabdominal mass effect. Soft tissue attenuation from habitus limits detailed assessment   IMPRESSION: Left nephrolithiasis, stones either more confluent or overlapping compared with prior, currently measuring 9 mm.     Electronically Signed   By:  Keith Rake M.D.   On: 03/18/2021 18:23   KUB personally retrieved, agree with radiologic interpretation.  This was compared to his previous KUB.  Overall stone burden is similar but now the stones appear to be confluent/conglomerated into 1 single stone rather than individual stones.  Assessment & Plan:    Left kidney stone  - Stone is stable over stone burden but the stones are more confluenced   -We discussed various treatment options for urolithiasis including observation with or without medical expulsive therapy, shockwave lithotripsy (SWL), ureteroscopy and laser lithotripsy with stent placement, and percutaneous nephrolithotomy. We discussed  that management is based on stone size, location, density, patient co-morbidities, and patient preference. It is resonalble to consider age   - He has decided on conservative management with a KUB in a year.   -We discussed general stone prevention techniques including drinking plenty water with goal of producing 2.5 L urine daily, increased citric acid intake, avoidance of high oxalate containing foods, and decreased salt intake.  Information about dietary recommendations given today.    Follow-up with KUB in 1 year   I,Kailey Littlejohn,acting as a scribe for Hollice Espy, MD.,have documented all relevant documentation on the behalf of Hollice Espy, MD,as directed by  Hollice Espy, MD while in the presence of Hollice Espy, MD.  I have reviewed the above documentation for accuracy and completeness, and I agree with the above.   Hollice Espy, MD   [facility] [address] [place], [place] [address] [phone]

## 2021-03-23 ENCOUNTER — Ambulatory Visit: Payer: Medicare HMO | Admitting: Orthopaedic Surgery

## 2021-03-23 ENCOUNTER — Other Ambulatory Visit: Payer: Self-pay

## 2021-03-23 ENCOUNTER — Ambulatory Visit: Payer: Medicare HMO | Admitting: Urology

## 2021-03-23 VITALS — BP 133/86 | HR 90 | Ht 71.0 in | Wt 320.0 lb

## 2021-03-23 DIAGNOSIS — N200 Calculus of kidney: Secondary | ICD-10-CM | POA: Diagnosis not present

## 2021-03-29 ENCOUNTER — Other Ambulatory Visit: Payer: Self-pay

## 2021-03-29 ENCOUNTER — Ambulatory Visit (INDEPENDENT_AMBULATORY_CARE_PROVIDER_SITE_OTHER): Payer: Medicare HMO | Admitting: Family Medicine

## 2021-03-29 ENCOUNTER — Encounter: Payer: Self-pay | Admitting: Family Medicine

## 2021-03-29 VITALS — BP 130/80 | HR 84 | Temp 98.1°F | Ht 71.0 in | Wt 309.6 lb

## 2021-03-29 DIAGNOSIS — I4821 Permanent atrial fibrillation: Secondary | ICD-10-CM

## 2021-03-29 DIAGNOSIS — Z8739 Personal history of other diseases of the musculoskeletal system and connective tissue: Secondary | ICD-10-CM

## 2021-03-29 DIAGNOSIS — I10 Essential (primary) hypertension: Secondary | ICD-10-CM | POA: Diagnosis not present

## 2021-03-29 DIAGNOSIS — M170 Bilateral primary osteoarthritis of knee: Secondary | ICD-10-CM | POA: Diagnosis not present

## 2021-03-29 DIAGNOSIS — Z125 Encounter for screening for malignant neoplasm of prostate: Secondary | ICD-10-CM | POA: Diagnosis not present

## 2021-03-29 LAB — COMPREHENSIVE METABOLIC PANEL
ALT: 24 U/L (ref 0–53)
AST: 21 U/L (ref 0–37)
Albumin: 4.4 g/dL (ref 3.5–5.2)
Alkaline Phosphatase: 45 U/L (ref 39–117)
BUN: 16 mg/dL (ref 6–23)
CO2: 26 mEq/L (ref 19–32)
Calcium: 9.6 mg/dL (ref 8.4–10.5)
Chloride: 102 mEq/L (ref 96–112)
Creatinine, Ser: 0.93 mg/dL (ref 0.40–1.50)
GFR: 86.97 mL/min (ref >60.00)
Glucose, Bld: 96 mg/dL (ref 70–99)
Potassium: 4.6 mEq/L (ref 3.5–5.1)
Sodium: 139 mEq/L (ref 135–145)
Total Bilirubin: 1.2 mg/dL (ref 0.2–1.2)
Total Protein: 6.6 g/dL (ref 6.0–8.3)

## 2021-03-29 LAB — HEMOGLOBIN A1C: Hgb A1c MFr Bld: 5.4 % (ref 4.6–6.5)

## 2021-03-29 LAB — LDL CHOLESTEROL, DIRECT: Direct LDL: 102 mg/dL

## 2021-03-29 LAB — LIPID PANEL
Cholesterol: 155 mg/dL (ref 0–200)
HDL: 41.1 mg/dL (ref >39.00)
NonHDL: 114.39
Total CHOL/HDL Ratio: 4
Triglycerides: 201 mg/dL — ABNORMAL HIGH (ref 0.0–149.0)
VLDL: 40.2 mg/dL — ABNORMAL HIGH (ref 0.0–40.0)

## 2021-03-29 LAB — PSA, MEDICARE: PSA: 0.15 ng/ml (ref 0.10–4.00)

## 2021-03-29 LAB — URIC ACID: Uric Acid, Serum: 6.8 mg/dL (ref 4.0–7.8)

## 2021-03-29 MED ORDER — TRAMADOL HCL 50 MG PO TABS: ORAL_TABLET | ORAL | 0 refills | Status: DC

## 2021-03-29 NOTE — Assessment & Plan Note (Signed)
He will see orthopedics as planned.  We will continue tramadol 1 tablet every 6 hours as needed for pain.  He will monitor for drowsiness.  He will not drive if he is drowsy.

## 2021-03-29 NOTE — Patient Instructions (Signed)
Nice to see you. We will get lab work today and contact you with the results. Please continue with your diet and exercise changes. Please do not drive if you are drowsy while taking your tramadol.

## 2021-03-29 NOTE — Assessment & Plan Note (Signed)
No flares in the last 9 to 10 years.  We will check a uric acid.

## 2021-03-29 NOTE — Assessment & Plan Note (Signed)
Adequate control.  He will continue lisinopril and metoprolol and verapamil as outlined.

## 2021-03-29 NOTE — Progress Notes (Signed)
[name], MD Phone: [phone]  Carlos Crosby is a 65 y.o. male who presents today for f/u.  HYPERTENSION/AFIB Disease Monitoring Chest pain- no    Dyspnea- no    Palpitations- no Medications Compliance-  taking digoxin, eliquis, metoprolol, verapamil, and lisinopril.   Edema- no     Bleeding- no BMET    Component Value Date/Time   NA 136 10/11/2020 0355   NA 138 09/05/2019 0940   NA 141 10/01/2014 1200   K 4.3 10/11/2020 0355   K 4.2 10/01/2014 1200   CL 102 10/11/2020 0355   CL 108 10/01/2014 1200   CO2 23 10/11/2020 0355   CO2 22 10/01/2014 1200   GLUCOSE 123 (H) 10/11/2020 0355   GLUCOSE 112 (H) 10/01/2014 1200   BUN 28 (H) 10/11/2020 0355   BUN 17 09/05/2019 0940   BUN 28 (H) 10/01/2014 1200   CREATININE 0.97 10/11/2020 0355   CREATININE 1.10 10/01/2014 1200   CALCIUM 9.3 10/11/2020 0355   CALCIUM 9.5 10/01/2014 1200   GFRNONAA >60 10/11/2020 0355   GFRNONAA >60 10/01/2014 1200   GFRAA 107 09/05/2019 0940   GFRAA >60 10/01/2014 1200   Chronic knee pain: Patient continues with tramadol.  He does note it is helpful.  He only gets drowsy when he takes it in combination with his metoprolol.  He has chronic decreased left knee range of motion.  He is following with orthopedic surgery and sees them in the near future to discuss the surgery for his left knee.  History of gout: Patient reports no recent gout flares.  Notes his last flare was in 2013 and these seem to resolve when he went to drinking just water.  Morbid obesity: Patient is swimming 3 times weekly for exercise.  He is doing 1500 cal/day.  He is typically eating Atkins meals and using portion control.  He gave up soda in 2012.  Notes he has lost some weight since going back to the 1500 calorie/day diet.  Social History   Tobacco Use  Smoking Status Never  Smokeless Tobacco Never    Current Outpatient Medications on File Prior to Visit  Medication Sig Dispense Refill   acetaminophen (TYLENOL)  500 MG tablet Take 1,000 mg by mouth every 6 (six) hours as needed.     Cholecalciferol (VITAMIN D) 2000 units CAPS daily.      digoxin (LANOXIN) 0.25 MG tablet Take by mouth.     ELIQUIS 5 MG TABS tablet TAKE 1 TABLET TWICE DAILY 180 tablet 1   lisinopril (ZESTRIL) 2.5 MG tablet TAKE 1 TABLET EVERY DAY 90 tablet 3   loratadine (CLARITIN) 10 MG tablet Take 10 mg by mouth daily.     metoprolol succinate (TOPROL-XL) 100 MG 24 hr tablet TAKE 2 TABLETS EVERY MORNING  AND TAKE 1 TABLET EVERY EVENING 270 tablet 3   Multiple Vitamins-Minerals (CENTRUM SILVER ADULT 50+) TABS Take 1 tablet by mouth daily.     Potassium 95 MG TABS Take 1 tablet by mouth.     pravastatin (PRAVACHOL) 80 MG tablet Take 1 tablet (80 mg total) by mouth every evening. 90 tablet 3   verapamil (CALAN-SR) 120 MG CR tablet TAKE 1 TABLET EVERY DAY 90 tablet 1   No current facility-administered medications on file prior to visit.     ROS see history of present illness  Objective  Physical Exam Vitals:   03/29/21 0942  BP: 130/80  Pulse: 84  Temp: 98.1 F (36.7 C)  SpO2: 98%  BP Readings from Last 3 Encounters:  03/29/21 130/80  03/23/21 133/86  02/11/21 103/81   Wt Readings from Last 3 Encounters:  03/29/21 (!) 309 lb 9.6 oz (140.4 kg)  03/23/21 (!) 320 lb (145.2 kg)  02/11/21 (!) 338 lb (153.3 kg)    Physical Exam Constitutional:      General: He is not in acute distress.    Appearance: He is not diaphoretic.  Cardiovascular:     Rate and Rhythm: Normal rate. Rhythm irregularly irregular.     Heart sounds: Normal heart sounds.  Pulmonary:     Effort: Pulmonary effort is normal.     Breath sounds: Normal breath sounds.  Musculoskeletal:     Right lower leg: No edema.     Left lower leg: No edema.  Skin:    General: Skin is warm and dry.  Neurological:     Mental Status: He is alert.     Assessment/Plan: Please see individual problem list.  Problem List Items Addressed This Visit      Atrial fibrillation ([place]) - Primary    Rate controlled.  He will continue to his digoxin through cardiology.  He will continue Eliquis 5 mg twice daily, metoprolol 200 mg in the morning and 100 mg in the evening, and verapamil 120 mg daily.      Relevant Orders   Comp Met (CMET)   Essential hypertension    Adequate control.  He will continue lisinopril and metoprolol and verapamil as outlined.      Relevant Orders   Comp Met (CMET)   Lipid panel   History of gout    No flares in the last 9 to 10 years.  We will check a uric acid.      Relevant Orders   Uric acid   Morbid (severe) obesity due to excess calories ([place])    Patient's weight has been trending down.  I encouraged continued exercise.  Discussed continuing with his current dietary changes.  We will continue to monitor.      Relevant Orders   HgB A1c   Osteoarthritis of both knees    He will see orthopedics as planned.  We will continue tramadol 1 tablet every 6 hours as needed for pain.  He will monitor for drowsiness.  He will not drive if he is drowsy.      Relevant Medications   traMADol (ULTRAM) 50 MG tablet   Other Visit Diagnoses     Prostate cancer screening       Relevant Orders   PSA, Medicare ( [place] only)        Health Maintenance: reports he will get his shingles vaccine next year due to reduction in cost through insurance.   Return in about 3 months (around 06/29/2021) for chronic knee pain/tramadol follow-up.  This visit occurred during the SARS-CoV-2 public health emergency.  Safety protocols were in place, including screening questions prior to the visit, additional usage of staff PPE, and extensive cleaning of exam room while observing appropriate contact time as indicated for disinfecting solutions.    [name], MD [place]

## 2021-03-29 NOTE — Assessment & Plan Note (Signed)
Rate controlled.  He will continue to his digoxin through cardiology.  He will continue Eliquis 5 mg twice daily, metoprolol 200 mg in the morning and 100 mg in the evening, and verapamil 120 mg daily.

## 2021-03-29 NOTE — Assessment & Plan Note (Signed)
Patient's weight has been trending down.  I encouraged continued exercise.  Discussed continuing with his current dietary changes.  We will continue to monitor.

## 2021-03-30 ENCOUNTER — Ambulatory Visit: Payer: Medicare HMO | Admitting: Orthopaedic Surgery

## 2021-04-06 DIAGNOSIS — G4730 Sleep apnea, unspecified: Secondary | ICD-10-CM | POA: Diagnosis not present

## 2021-04-06 DIAGNOSIS — G4733 Obstructive sleep apnea (adult) (pediatric): Secondary | ICD-10-CM | POA: Diagnosis not present

## 2021-04-28 ENCOUNTER — Other Ambulatory Visit: Payer: Self-pay | Admitting: Cardiovascular Disease

## 2021-04-28 NOTE — Telephone Encounter (Signed)
Please contact pt for future appointment. Pt overdue for 12 month f/u. 

## 2021-05-05 ENCOUNTER — Ambulatory Visit: Payer: Medicare HMO | Admitting: Physician Assistant

## 2021-05-05 ENCOUNTER — Encounter: Payer: Self-pay | Admitting: Physician Assistant

## 2021-05-05 ENCOUNTER — Other Ambulatory Visit: Payer: Self-pay

## 2021-05-05 VITALS — BP 124/84 | HR 92 | Ht 71.0 in | Wt 321.0 lb

## 2021-05-05 DIAGNOSIS — I428 Other cardiomyopathies: Secondary | ICD-10-CM | POA: Diagnosis not present

## 2021-05-05 DIAGNOSIS — I5042 Chronic combined systolic (congestive) and diastolic (congestive) heart failure: Secondary | ICD-10-CM | POA: Diagnosis not present

## 2021-05-05 DIAGNOSIS — G4733 Obstructive sleep apnea (adult) (pediatric): Secondary | ICD-10-CM | POA: Diagnosis not present

## 2021-05-05 DIAGNOSIS — Z9989 Dependence on other enabling machines and devices: Secondary | ICD-10-CM

## 2021-05-05 DIAGNOSIS — I4821 Permanent atrial fibrillation: Secondary | ICD-10-CM | POA: Diagnosis not present

## 2021-05-05 DIAGNOSIS — E782 Mixed hyperlipidemia: Secondary | ICD-10-CM | POA: Diagnosis not present

## 2021-05-05 DIAGNOSIS — I10 Essential (primary) hypertension: Secondary | ICD-10-CM

## 2021-05-05 NOTE — Patient Instructions (Signed)
Medication Instructions:  Your physician recommends that you continue on your current medications as directed. Please refer to the Current Medication list given to you today.  *If you need a refill on your cardiac medications before your next appointment, please call your pharmacy*   Lab Work: None ordered If you have labs (blood work) drawn today and your tests are completely normal, you will receive your results only by: [place] (if you have MyChart) OR A paper copy in the mail If you have any lab test that is abnormal or we need to change your treatment, we will call you to review the results.   Testing/Procedures: Your physician has requested that you have an echocardiogram. Echocardiography is a painless test that uses sound waves to create images of your heart. It provides your doctor with information about the size and shape of your heart and how well your heart's chambers and valves are working. This procedure takes approximately one hour. There are no restrictions for this procedure.    Follow-Up: At [facility], you and your health needs are our priority.  As part of our continuing mission to provide you with exceptional heart care, we have created designated Provider Care Teams.  These Care Teams include your primary Cardiologist (physician) and Advanced Practice Providers (APPs -  Physician Assistants and Nurse Practitioners) who all work together to provide you with the care you need, when you need it.  We recommend signing up for the patient portal called "MyChart".  Sign up information is provided on this After Visit Summary.  MyChart is used to connect with patients for Virtual Visits (Telemedicine).  Patients are able to view lab/test results, encounter notes, upcoming appointments, etc.  Non-urgent messages can be sent to your provider as well.   To learn more about what you can do with MyChart, go to [email or web address].    Your next appointment:   6  month(s)  The format for your next appointment:   In Person  Provider:   You may see Ida Rogue, MD or one of the following Advanced Practice Providers on your designated Care Team:   Murray Hodgkins, NP Christell Faith, PA-C Cadence Kathlen Mody, PA-C :1}    Other Instructions N/A

## 2021-05-05 NOTE — Progress Notes (Signed)
Cardiology Office Note    Date:  05/05/2021   ID:  Carlos Crosby, DOB [birth year], MRN [id number]  PCP:  Leone Haven, MD  Cardiologist:  Ida Rogue, MD  Electrophysiologist:  Virl Axe, MD   Chief Complaint: Follow-up  History of Present Illness:   Carlos Crosby is a 65 y.o. male with history of nonobstructive CAD by [facility] in 2012 at [facility], chronic combined systolic and diastolic CHF with subsequent improvement in LV systolic function, nonischemic cardiomyopathy, permanent A. fib dating back to 2014 previously on Tikosyn, HTN, HLD, morbid obesity, OSA on CPAP, nephrolithiasis, and osteoarthritis of the bilateral knees who presents for follow-up of his cardiomyopathy and A. fib.  He previously underwent cardiac cath at [facility] in 2012 which showed nonobstructive disease.  Further details are unavailable.  Prior echo from 05/2015 demonstrated an EF of 30 to 35%.  Echo in 11/2015 showed a slight improvement in LV systolic function with an EF of 45 to 50%.  Most recent ischemic evaluation in 12/2015, via [place] MPI demonstrated nonreversible medium defect of moderate severity present in the basal inferior, basal inferior lateral, mid inferior, and mid inferolateral location as well as a small defect of mild severity present in the basal anteroseptal location.  No ischemia was noted.  Overall, this was an intermediate risk rate due to reduced LVEF.  Most recent echo from 10/2017 showed interval improvement in his LV systolic function with an EF being low normal at 50 to 55%, severely dilated left atrium, moderately dilated right atrium, normal RV systolic function, moderate tricuspid regurgitation, and mildly elevated PASP estimated at 40 mmHg.  With regards to his A. fib, this dates back to 2014, and declined ablation at [facility] at that time.  EP note indicates he has been receiving anticoagulation from [place].  He was last seen by EP in 12/2020 and was doing well from a cardiac perspective, swimming  2-3 times per week.  He comes in doing well from a cardiac perspective.  No chest pain, dyspnea, palpitations, dizziness, presyncope, or syncope.  No lower extremity swelling.  At times, he reports his dog does not come down, though he denies any falls otherwise.  No melena.  He does occasionally note some mild streaking of blood on the external surface of his stool which has been attributed to hemorrhoids which have previously required repair.  He is tolerating all cardiac medications without issues.  Through improved diet, and regular exercise, prepandemic, he has lost approximately 80 pounds.  Throughout the pandemic, some of this weight was gained back.  With continued lifestyle modification, his weight is down 18 pounds today when compared to his last clinic visit.  Overall, he feels like he is doing very well.   Labs independently reviewed: 03/2021 - direct LDL 102, TC 155, TG 201, HDL 41, A1c 5.4, potassium 4.6, BUN 16, serum creatinine 0.93, albumin 4.4, AST/ALT normal 02/2021 - Hgb 18.2, PLT 202 12/2020 - digoxin 0.4 11/2017 - TSH normal  Past Medical History:  Diagnosis Date   Chronic a-fib (HCC)    Chronic systolic CHF (congestive heart failure) (HCC)    Depression    Hemochromatosis    History of kidney stones    History of pulmonary embolism    Hyperlipidemia    Hypertension    Migraine    Obesity    OSA (obstructive sleep apnea)    on CPAP   Osteoarthritis    bilateral knees   [facility] spotted fever 2011  Past Surgical History:  Procedure Laterality Date   CARDIAC CATHETERIZATION  2012   [facility]   KNEE SURGERY     right knee    LITHOTRIPSY  07/31/2014   URETERAL STENT PLACEMENT  2016   URETEROSCOPY      Current Medications: Current Meds  Medication Sig   acetaminophen (TYLENOL) 500 MG tablet Take 1,000 mg by mouth every 6 (six) hours as needed.   Cholecalciferol (VITAMIN D) 2000 units CAPS daily.    digoxin (LANOXIN) 0.25 MG tablet Take by mouth.   ELIQUIS  5 MG TABS tablet TAKE 1 TABLET TWICE DAILY   lisinopril (ZESTRIL) 2.5 MG tablet TAKE 1 TABLET EVERY DAY   loratadine (CLARITIN) 10 MG tablet Take 10 mg by mouth daily.   metoprolol succinate (TOPROL-XL) 100 MG 24 hr tablet TAKE 2 TABLETS EVERY MORNING  AND TAKE 1 TABLET EVERY EVENING   Multiple Vitamins-Minerals (CENTRUM SILVER ADULT 50+) TABS Take 1 tablet by mouth daily.   Potassium 95 MG TABS Take 1 tablet by mouth.   pravastatin (PRAVACHOL) 80 MG tablet Take 1 tablet (80 mg total) by mouth every evening. PLEASE SCHEDULE APPOINTMENT WITH DR. Rockey Situ OFFICE TO CONTINUE GETTING REFILLS ON MEDICATION.   traMADol (ULTRAM) 50 MG tablet TAKE 1 TABLET(50 MG) BY MOUTH EVERY 6 HOURS AS NEEDED FOR PAIN   verapamil (CALAN-SR) 120 MG CR tablet TAKE 1 TABLET EVERY DAY    Allergies:   Allopurinol, Diltiazem, Ondansetron, and Penicillins   Social History   Socioeconomic History   Marital status: Married    Spouse name: Not on file   Number of children: Not on file   Years of education: Not on file   Highest education level: Not on file  Occupational History   Not on file  Tobacco Use   Smoking status: Never   Smokeless tobacco: Never  Vaping Use   Vaping Use: Never used  Substance and Sexual Activity   Alcohol use: No    Comment: occasional; 3 times a year   Drug use: No   Sexual activity: Not Currently  Other Topics Concern   Not on file  Social History Narrative   Not on file   Social Determinants of Health   Financial Resource Strain: Low Risk    Difficulty of Paying Living Expenses: Not hard at all  Food Insecurity: No Food Insecurity   Worried About Charity fundraiser in the Last Year: Never true   Ran Out of Food in the Last Year: Never true  Transportation Needs: No Transportation Needs   Lack of Transportation (Medical): No   Lack of Transportation (Non-Medical): No  Physical Activity: Sufficiently Active   Days of Exercise per Week: 3 days   Minutes of Exercise per  Session: 90 min  Stress: No Stress Concern Present   Feeling of Stress : Not at all  Social Connections: Unknown   Frequency of Communication with Friends and Family: More than three times a week   Frequency of Social Gatherings with Friends and Family: More than three times a week   Attends Religious Services: Not on Electrical engineer or Organizations: Yes   Attends Music therapist: More than 4 times per year   Marital Status: Not on file     Family History:  The patient's family history includes Arthritis in his father; Cancer in his father and paternal grandfather; Hypertension in his father; Thyroid disease in his mother.  ROS:   Review  of Systems  Constitutional:  Negative for chills, diaphoresis, fever, malaise/fatigue and weight loss.  HENT:  Negative for congestion.   Eyes:  Negative for discharge and redness.  Respiratory:  Negative for cough, sputum production, shortness of breath and wheezing.   Cardiovascular:  Negative for chest pain, palpitations, orthopnea, claudication, leg swelling and PND.  Gastrointestinal:  Positive for blood in stool. Negative for abdominal pain, heartburn, melena, nausea and vomiting.  Musculoskeletal:  Positive for falls and joint pain. Negative for myalgias.       Occasional fall secondary to his dog jumping on him  Skin:  Negative for rash.  Neurological:  Negative for dizziness, tingling, tremors, sensory change, speech change, focal weakness, loss of consciousness and weakness.  Endo/Heme/Allergies:  Does not bruise/bleed easily.  Psychiatric/Behavioral:  Negative for substance abuse. The patient is not nervous/anxious.   All other systems reviewed and are negative.   EKGs/Labs/Other Studies Reviewed:    Studies reviewed were summarized above. The additional studies were reviewed today:  2D echo 10/2017: - Left ventricle: The cavity size was mildly dilated. Systolic    function was normal. The estimated  ejection fraction was in the    range of 50% to 55%. Regional wall motion abnormalities cannot be    excluded. The study is not technically sufficient to allow    evaluation of LV diastolic function.  - Left atrium: The atrium was severely dilated.  - Right ventricle: Systolic function was normal.  - Right atrium: The atrium was moderately dilated.  - Tricuspid valve: There was moderate regurgitation.  - Pulmonary arteries: Systolic pressure was mildly elevated. PA    peak pressure: 40 mm Hg (S).   Impressions:   - Rhythm is atrial fibrillation. __________  2D echo 03/2017: - Left ventricle: The cavity size was moderately dilated. Systolic    function was mildly to moderately reduced. The estimated ejection    fraction was in the range of 40% to 45% (EF appears 45 to 50% in    select images) Regional wall motion abnormalities cannot be    excluded. The study is not technically sufficient to allow    evaluation of LV diastolic function.  - Mitral valve: There was mild regurgitation.  - Left atrium: The atrium was moderately dilated.  - Right ventricle: Systolic function was normal.  - Right atrium: The atrium was mildly dilated.  - Pulmonary arteries: Systolic pressure was within the normal    range.   Impressions:   - Rhythm is atrial fibrillation. Challenging image quality. __________  [facility] MPI 12/2015: Nuclear stress EF: 42%. Nonspecific ST abnormality in the inferolateral leads at baseline persisted during lexiscan infusion. No changes from baseline EKG There is a medium defect of moderate severity present in the basal inferior, basal inferolateral, mid inferior and mid inferolateral location as well as a small defect of mild severity present in the basal anteroseptal location. The defect are non-reversible. No ischemia noted. Image quality affected by soft tissue attenuation and body habitus. This is an intermediate risk study due to reduced LVF. The left ventricular  ejection fraction is moderately decreased (30-44%). __________  2D echo 11/2015: - Left ventricle: The cavity size was moderately dilated. Systolic    function was mildly reduced. The estimated ejection fraction was    in the range of 45% to 50%. Images were inadequate for LV wall    motion assessment.  - Left atrium: The atrium was moderately dilated.  - Right ventricle: Systolic function was normal.  -  Pulmonary arteries: Systolic pressure was within the normal    range.   Impressions:   - Rhythm is atrial fibrillation. Challenging image quality.    Assessment of wall motion abnormality challenging secondary to    underlying arrhythmia.  __________  24-hour Holter 10/2015: Dominant Rhythm    Atrial Fib Mean HR 77 Range HR 38-128   Total beats 108K PVC 240 PAC 0 Tachycardia none   Pauses none   Symptoms noted none    Rate control  reasonable __________  2D echo 08/2015: - Left ventricle: The cavity size was moderately dilated. Wall    thickness was normal. Systolic function was moderately to    severely reduced. The estimated ejection fraction was in the    range of 30% to 35%. The study was not technically sufficient to    allow evaluation of LV diastolic dysfunction due to atrial    fibrillation.  - Mitral valve: There was mild regurgitation.  - Left atrium: The atrium was severely dilated.  - Right ventricle: The cavity size was moderately dilated. Wall    thickness was normal.  - Right atrium: The atrium was severely dilated.  - Pulmonary arteries: Systolic pressure was mildly increased. PA    peak pressure: 40 mm Hg (S).  __________  2D echo 05/2015: - Procedure narrative: Transthoracic echocardiography. Image    quality was poor. The study was technically difficult, as a    result of body habitus.  - Left ventricle: The cavity size was mildly dilated. There was    mild concentric hypertrophy. Systolic function was moderately to    severely reduced. The  estimated ejection fraction was in the    range of 30% to 35%. The study is not technically sufficient to    allow evaluation of LV diastolic function.  - Mitral valve: There was mild regurgitation.  - Left atrium: The atrium was severely dilated.  - Right atrium: The atrium was severely dilated.     EKG:  EKG is ordered today.  The EKG ordered today demonstrates A. fib, 92 bpm, no acute ST-T changes  Recent Labs: 02/10/2021: Hemoglobin 18.2; Platelets 202 03/29/2021: ALT 24; BUN 16; Creatinine, Ser 0.93; Potassium 4.6; Sodium 139  Recent Lipid Panel    Component Value Date/Time   CHOL 155 03/29/2021 1002   TRIG 201.0 (H) 03/29/2021 1002   HDL 41.10 03/29/2021 1002   CHOLHDL 4 03/29/2021 1002   VLDL 40.2 (H) 03/29/2021 1002   LDLCALC 77 02/25/2020 0854   LDLDIRECT 102.0 03/29/2021 1002    PHYSICAL EXAM:    VS:  BP 124/84 (BP Location: Left Arm, Patient Position: Sitting, Cuff Size: Large)   Pulse 92   Ht 5\' 11"  (1.803 m)   Wt (!) 321 lb (145.6 kg)   BMI 44.77 kg/m   BMI: Body mass index is 44.77 kg/m.  Physical Exam Constitutional:      Appearance: He is well-developed.  HENT:     Head: Normocephalic and atraumatic.  Eyes:     General:        Right eye: No discharge.        Left eye: No discharge.  Neck:     Vascular: No JVD.  Cardiovascular:     Rate and Rhythm: Normal rate. Rhythm irregularly irregular.     Pulses:          Posterior tibial pulses are 2+ on the right side and 2+ on the left side.     Heart sounds: Normal  heart sounds, S1 normal and S2 normal. Heart sounds not distant. No midsystolic click and no opening snap. No murmur heard.   No friction rub.  Pulmonary:     Effort: Pulmonary effort is normal. No respiratory distress.     Breath sounds: Normal breath sounds. No decreased breath sounds, wheezing or rales.  Chest:     Chest wall: No tenderness.  Abdominal:     General: There is no distension.     Palpations: Abdomen is soft.      Tenderness: There is no abdominal tenderness.  Musculoskeletal:     Cervical back: Normal range of motion.     Right lower leg: No edema.     Left lower leg: No edema.  Skin:    General: Skin is warm and dry.     Nails: There is no clubbing.  Neurological:     Mental Status: He is alert and oriented to person, place, and time.  Psychiatric:        Speech: Speech normal.        Behavior: Behavior normal.        Thought Content: Thought content normal.        Judgment: Judgment normal.    Wt Readings from Last 3 Encounters:  05/05/21 (!) 321 lb (145.6 kg)  03/29/21 (!) 309 lb 9.6 oz (140.4 kg)  03/23/21 (!) 320 lb (145.2 kg)     ASSESSMENT & PLAN:   Chronic combined systolic and diastolic CHF with subsequent improvement in LV systolic function/NICM: He appears euvolemic and well compensated with NYHA class II symptoms.  He remains on Toprol-XL, lisinopril, and digoxin.  We will obtain an echo to trend his cardiomyopathy.  If his LV dysfunction persists would recommend initiation of SGLT2i.  Would also consider transition from ACE inhibitor to [facility], following washout, as well as possible addition of MRA to further optimize GDMT.  CHF education.  Permanent A. fib: Ventricular rate is reasonably controlled.  He remains on Toprol-XL, digoxin, and verapamil for rate control.  Recent digoxin level in 12/2020 okay.  Declines updating this at this time.  Given a CHA2DS2-VASc of at least 3 (CHF, HTN, vascular disease), he remains on apixaban with stable CBC noted last month.  HTN: Blood pressure is well controlled in the office today.  Continue current medications as outlined above.  HLD: LDL 102 from 03/2021.  He remains on pravastatin.  Followed by PCP.  Morbid obesity with OSA: By our scale, his weight is down 18 pounds since his last visit.  This is intentional with improved diet and regular exercise.  Continue CPAP use under the direction of [facility] neurology.   Disposition: F/u with Dr.  Rockey Situ or an APP in 6 months and EP as directed.   Medication Adjustments/Labs and Tests Ordered: Current medicines are reviewed at length with the patient today.  Concerns regarding medicines are outlined above. Medication changes, Labs and Tests ordered today are summarized above and listed in the Patient Instructions accessible in Encounters.   Signed, Christell Faith, PA-C 05/05/2021 9:29 AM     [facility] [address] [place], [place] [address] [phone]

## 2021-05-10 ENCOUNTER — Encounter: Payer: Self-pay | Admitting: Orthopaedic Surgery

## 2021-05-12 ENCOUNTER — Other Ambulatory Visit: Payer: Self-pay

## 2021-05-12 ENCOUNTER — Encounter: Payer: Self-pay | Admitting: Nurse Practitioner

## 2021-05-12 ENCOUNTER — Other Ambulatory Visit: Payer: Self-pay | Admitting: Family Medicine

## 2021-05-12 ENCOUNTER — Inpatient Hospital Stay: Payer: Medicare HMO | Attending: Oncology

## 2021-05-12 ENCOUNTER — Ambulatory Visit: Payer: Medicare HMO | Admitting: Orthopaedic Surgery

## 2021-05-12 DIAGNOSIS — M170 Bilateral primary osteoarthritis of knee: Secondary | ICD-10-CM

## 2021-05-12 LAB — CBC WITH DIFFERENTIAL/PLATELET
Abs Immature Granulocytes: 0.05 10*3/uL (ref 0.00–0.07)
Basophils Absolute: 0.1 10*3/uL (ref 0.0–0.1)
Basophils Relative: 1 %
Eosinophils Absolute: 0.3 10*3/uL (ref 0.0–0.5)
Eosinophils Relative: 4 %
HCT: 48.1 % (ref 39.0–52.0)
Hemoglobin: 17.1 g/dL — ABNORMAL HIGH (ref 13.0–17.0)
Immature Granulocytes: 1 %
Lymphocytes Relative: 39 %
Lymphs Abs: 2.8 10*3/uL (ref 0.7–4.0)
MCH: 34.4 pg — ABNORMAL HIGH (ref 26.0–34.0)
MCHC: 35.6 g/dL (ref 30.0–36.0)
MCV: 96.8 fL (ref 80.0–100.0)
Monocytes Absolute: 0.7 10*3/uL (ref 0.1–1.0)
Monocytes Relative: 10 %
Neutro Abs: 3.3 10*3/uL (ref 1.7–7.7)
Neutrophils Relative %: 45 %
Platelets: 191 10*3/uL (ref 150–400)
RBC: 4.97 MIL/uL (ref 4.22–5.81)
RDW: 13 % (ref 11.5–15.5)
WBC: 7.1 10*3/uL (ref 4.0–10.5)
nRBC: 0 % (ref 0.0–0.2)

## 2021-05-12 LAB — IRON AND TIBC
Iron: 172 ug/dL (ref 45–182)
Saturation Ratios: 46 % — ABNORMAL HIGH (ref 17.9–39.5)
TIBC: 378 ug/dL (ref 250–450)
UIBC: 206 ug/dL

## 2021-05-12 LAB — FERRITIN: Ferritin: 96 ng/mL (ref 24–336)

## 2021-05-12 MED ORDER — TRAMADOL HCL 50 MG PO TABS: ORAL_TABLET | ORAL | 0 refills | Status: DC

## 2021-05-12 NOTE — Telephone Encounter (Signed)
RX Refill: tramadol Last Seen: 03-19-21 Last Ordered: 03-29-21 Next Appt: 07-19-21

## 2021-05-13 ENCOUNTER — Other Ambulatory Visit: Payer: Medicare HMO

## 2021-05-13 ENCOUNTER — Inpatient Hospital Stay: Payer: Medicare HMO

## 2021-05-18 ENCOUNTER — Ambulatory Visit: Payer: Medicare HMO | Admitting: Orthopaedic Surgery

## 2021-05-18 ENCOUNTER — Other Ambulatory Visit: Payer: Self-pay

## 2021-05-18 ENCOUNTER — Encounter: Payer: Self-pay | Admitting: Orthopaedic Surgery

## 2021-05-18 DIAGNOSIS — M170 Bilateral primary osteoarthritis of knee: Secondary | ICD-10-CM

## 2021-05-18 MED ORDER — METHYLPREDNISOLONE ACETATE 40 MG/ML IJ SUSP
80.0000 mg | INTRAMUSCULAR | Status: AC | PRN
  Administered 2021-05-18: 80 mg via INTRA_ARTICULAR

## 2021-05-18 MED ORDER — BUPIVACAINE HCL 0.25 % IJ SOLN
2.0000 mL | INTRAMUSCULAR | Status: AC | PRN
  Administered 2021-05-18: 2 mL via INTRA_ARTICULAR

## 2021-05-18 MED ORDER — LIDOCAINE HCL 1 % IJ SOLN
2.0000 mL | INTRAMUSCULAR | Status: AC | PRN
Start: 2021-05-18 — End: 2021-05-18
  Administered 2021-05-18: 2 mL

## 2021-05-18 MED ORDER — LIDOCAINE HCL 1 % IJ SOLN
2.0000 mL | INTRAMUSCULAR | Status: AC | PRN
  Administered 2021-05-18: 2 mL

## 2021-05-18 NOTE — Progress Notes (Signed)
Office Visit Note   Patient: Carlos Crosby           Date of Birth: 15-Nov-1955           MRN: [id number] Visit Date: 05/18/2021              Requested by: Leone Haven, MD [address] [place],  [place] [address] PCP: Leone Haven, MD   Assessment & Plan: Visit Diagnoses:  1. Primary osteoarthritis of both knees     Plan: Mr. Shawn has osteoarthritis of both knees and essentially end-stage.  He also has a BMI of 43 and is trying to lose weight.  We have been injecting his knees with cortisone and viscosupplementation with some temporary relief.  He is having recurrent symptoms and I have injected both of his knees with cortisone today.  His BMI today is 43 and I think he needs to lose about 40 pounds to have a BMI of about 38.  Have discussed this as well  Follow-Up Instructions: Return if symptoms worsen or fail to improve.   Orders:  No orders of the defined types were placed in this encounter.  No orders of the defined types were placed in this encounter.     Procedures: Large Joint Inj: bilateral knee on 05/18/2021 3:01 PM Indications: diagnostic evaluation Details: 25 G 1.5 in needle, anteromedial approach  Arthrogram: No  Medications (Right): 2 mL lidocaine 1 %; 80 mg methylPREDNISolone acetate 40 MG/ML; 2 mL bupivacaine 0.25 % Medications (Left): 2 mL lidocaine 1 %; 80 mg methylPREDNISolone acetate 40 MG/ML; 2 mL bupivacaine 0.25 % Consent was given by the patient. Immediately prior to procedure a time out was called to verify the correct patient, procedure, equipment, support staff and site/side marked as required. Patient was prepped and draped in the usual sterile fashion.      Clinical Data: No additional findings.   Subjective: Chief Complaint  Patient presents with   Left Knee - Pain   Right Knee - Pain  Patient presents today for recurrent bilateral knee pain. He has finished orthovisc in September. He is wanting to get  bilateral cortisone injections.  Has a history of A. fib and is on Eliquis  HPI  Review of Systems   Objective: Vital Signs: There were no vitals taken for this visit.  Physical Exam Constitutional:      Appearance: He is well-developed.  Pulmonary:     Effort: Pulmonary effort is normal.  Skin:    General: Skin is warm and dry.  Neurological:     Mental Status: He is alert and oriented to person, place, and time.  Psychiatric:        Behavior: Behavior normal.    Ortho Exam prior surgery on both of his knees.  Is along medial parapatellar incision on the right with increased varus consistent with his bone-on-bone in the medial compartment.  May be small effusion but the knees were not hot warm or warm or red.  Lacks a few degrees to full extension on the right and near full extension on the left and flexes about 95 degrees to 100 bilaterally.  No instability.  Mostly medial joint pain bilaterally  Specialty Comments:  No specialty comments available.  Imaging: No results found.   PMFS History: Patient Active Problem List   Diagnosis Date Noted   History of gout 03/29/2021   Cough 10/09/2020   Morbid (severe) obesity due to excess calories ([place]) 09/29/2020  Trigger finger, left ring finger 05/07/2020   Chronic pain of left hand 04/15/2020   Routine general medical examination at a health care facility 02/25/2020   Rectal pain 10/18/2019   Rash 10/08/2019   Fall 02/05/2019   Neuropathy 11/16/2017   Prediabetes 11/16/2017   Vitamin D deficiency 11/16/2017   Hemochromatosis, hereditary ([place]) 07/23/2017   Bleeding nose 05/18/2017   Allergic rhinitis 01/26/2017   Blue nevus 05/16/2016   Hair loss 05/16/2016   Abdominal pain 67/54/4920   Umbilical hernia without obstruction and without gangrene 05/16/2016   Atrial fibrillation ([place]) 09/26/2014   Chronic diastolic congestive heart failure ([place]) 09/26/2014   Anxiety 09/26/2014   Obstructive sleep apnea on CPAP  09/26/2014   Essential hypertension 09/26/2014   Hyperlipidemia 09/26/2014   Osteoarthritis of both knees 09/26/2014   Kidney stone 09/26/2014   Past Medical History:  Diagnosis Date   Chronic a-fib (HCC)    Chronic systolic CHF (congestive heart failure) (HCC)    Depression    Hemochromatosis    History of kidney stones    History of pulmonary embolism    Hyperlipidemia    Hypertension    Migraine    Obesity    OSA (obstructive sleep apnea)    on CPAP   Osteoarthritis    bilateral knees   [place] spotted fever 2011    Family History  Problem Relation Age of Onset   Thyroid disease Mother    Cancer Father        lung   Arthritis Father    Hypertension Father    Cancer Paternal Grandfather        Throat cancer    Past Surgical History:  Procedure Laterality Date   CARDIAC CATHETERIZATION  2012   [facility]   KNEE SURGERY     right knee    LITHOTRIPSY  07/31/2014   URETERAL STENT PLACEMENT  2016   URETEROSCOPY     Social History   Occupational History   Not on file  Tobacco Use   Smoking status: Never   Smokeless tobacco: Never  Vaping Use   Vaping Use: Never used  Substance and Sexual Activity   Alcohol use: No    Comment: occasional; 3 times a year   Drug use: No   Sexual activity: Not Currently

## 2021-05-19 ENCOUNTER — Encounter: Payer: Self-pay | Admitting: Cardiovascular Disease

## 2021-05-27 ENCOUNTER — Encounter: Payer: Self-pay | Admitting: Family Medicine

## 2021-05-27 DIAGNOSIS — R2689 Other abnormalities of gait and mobility: Secondary | ICD-10-CM

## 2021-06-06 ENCOUNTER — Encounter: Payer: Self-pay | Admitting: Oncology

## 2021-06-08 ENCOUNTER — Other Ambulatory Visit: Payer: Self-pay | Admitting: Family

## 2021-06-08 ENCOUNTER — Other Ambulatory Visit: Payer: Self-pay | Admitting: Family Medicine

## 2021-06-08 ENCOUNTER — Telehealth: Payer: Self-pay | Admitting: Family

## 2021-06-08 DIAGNOSIS — M170 Bilateral primary osteoarthritis of knee: Secondary | ICD-10-CM

## 2021-06-08 MED ORDER — TRAMADOL HCL 50 MG PO TABS: ORAL_TABLET | ORAL | 0 refills | Status: DC

## 2021-06-08 NOTE — Telephone Encounter (Signed)
I called and was on hold for over 20 minutes trying to cancel. Since both within the [facility] system this should be okay.

## 2021-06-08 NOTE — Telephone Encounter (Signed)
Call [facility] [place] I sent tramadol there for pt and then realized he wanted to [facility] [place] so I resent there  Thank you!

## 2021-06-08 NOTE — Telephone Encounter (Signed)
Refilled: 05/12/2021 Last OV: 03/29/2021 Next OV: 07/19/2021

## 2021-06-10 ENCOUNTER — Other Ambulatory Visit: Payer: Self-pay

## 2021-06-10 ENCOUNTER — Encounter: Payer: Self-pay | Admitting: Oncology

## 2021-06-10 ENCOUNTER — Ambulatory Visit (INDEPENDENT_AMBULATORY_CARE_PROVIDER_SITE_OTHER): Payer: Medicare (Managed Care)

## 2021-06-10 DIAGNOSIS — I428 Other cardiomyopathies: Secondary | ICD-10-CM

## 2021-06-10 LAB — ECHOCARDIOGRAM COMPLETE
AR max vel: 2.71 cm2
AV Area VTI: 2.84 cm2
AV Area mean vel: 2.41 cm2
AV Mean grad: 4 mmHg
AV Peak grad: 7.1 mmHg
AV Vena cont: 0.6 cm
Ao pk vel: 1.33 m/s
MV M vel: 4.44 m/s
MV Peak grad: 78.9 mmHg
P 1/2 time: 776 msec
S' Lateral: 4.3 cm
Single Plane A4C EF: 54.2 %

## 2021-06-10 MED ORDER — PERFLUTREN LIPID MICROSPHERE
1.0000 mL | INTRAVENOUS | Status: AC | PRN
  Administered 2021-06-10: 4 mL via INTRAVENOUS

## 2021-06-14 MED ORDER — ENTRESTO 24-26 MG PO TABS: 1.0000 | ORAL_TABLET | Freq: Two times a day (BID) | ORAL | 3 refills | Status: DC

## 2021-06-14 NOTE — Telephone Encounter (Signed)
Application for assistance completed and placed in my "Pending Applications". Also placed page that patient needs to sign when he brings in the income documents. He will need to sign that one page and leave here in our office. Once signed place on my cart so that I can complete the other portion of his application. Patient aware of documents and signature needed to send in application. Advised once we hear back he should not start until he calls [place] first. This is because he "must have a wash out of 36 hours from ACE inhibitor prior to starting Entresto". He verbalized understanding of our conversation with no further questions at this time.

## 2021-06-14 NOTE — Telephone Encounter (Signed)
-----   Message from Rise Mu, PA-C sent at 06/14/2021 11:00 AM EST ----- Regarding: RE: Truett Perna 24/26 mg bid (must have a wash out of 36 hours from ACE inhibitor prior to starting Delene Loll) ----- Message ----- From: Valora Corporal, RN Sent: 06/14/2021  10:56 AM EST To: Rise Mu, PA-C Subject: Delene Loll                                       Patient sent in message regarding Entresto. He wants to fill out patient assistance application to see if they will assist with medication cost before starting. For application purposes, what dose would you start so I can complete forms.   Thanks, Olin Hauser

## 2021-06-18 ENCOUNTER — Telehealth: Payer: Self-pay

## 2021-06-18 NOTE — Telephone Encounter (Signed)
Application sent to [employer] and filed in [employer].

## 2021-06-18 NOTE — Telephone Encounter (Signed)
Notified patient per [employer] Patient [place] that he has been approved to receive Entresto until 06/05/2022 at no cost.

## 2021-06-22 ENCOUNTER — Other Ambulatory Visit: Payer: Self-pay

## 2021-06-22 ENCOUNTER — Encounter: Payer: Self-pay | Admitting: Cardiovascular Disease

## 2021-06-22 MED ORDER — DIGOXIN 250 MCG PO TABS: 0.2500 mg | ORAL_TABLET | Freq: Every day | ORAL | 0 refills | Status: DC

## 2021-06-27 ENCOUNTER — Encounter: Payer: Self-pay | Admitting: Cardiovascular Disease

## 2021-06-28 ENCOUNTER — Telehealth: Payer: Self-pay | Admitting: *Deleted

## 2021-06-28 DIAGNOSIS — I428 Other cardiomyopathies: Secondary | ICD-10-CM

## 2021-06-28 NOTE — Telephone Encounter (Signed)
[facility] Triage (supporting Rockey Situ, Kathlene November, MD) 20 hours ago (10:59 AM)   dr Rene Paci always trusted you with my heart care you have never led me wrong.Im a little bit nervous about taking ernesto. I did get approved  to get it for free .Before it gets here and I start it after  stopping  linsopril I have some questions and concerns I would like to talk to you about.i know your very busy but if you could call me when you get a chance I would appreciate  it

## 2021-06-28 NOTE — Telephone Encounter (Signed)
Reviewed provider recommendations. Patient has not yet started medication and is waiting for it to be shipped from company. Advised to call when he receives the medication so we can get him scheduled for these one week labs. He verbalized understanding of our conversation with no further questions at this time.    Rise Mu, PA-C  1 minute ago (3:50 PM)   Please inform the patient there are no known iron complications.  With regards to his renal function, he has tolerated ACE inhibitor without issues, therefore the transition from this medication to an ARB should be uneventful.  As previously mentioned, we will trend his renal function 1 week after initiating therapy to ensure this remains the case.  No known issues with nephrolithiasis.  This medication has been found to be protective of kidneys in some patients.

## 2021-06-28 NOTE — Telephone Encounter (Signed)
Spoke with patient in regards to his My Chart message. He inquired if there are any side effects in relation to his iron or hemachromatosis. Patient states that he also wondered if his kidneys should be affected. He reports having kidney stones that have been there for a long time. He has been reading about Entresto and the reviews online and wanted to just verify if this could be a problem. Advised I would pass on to provider for his review and would call back. Provided reassurance on medication and answered all questions for now. He was appreciative for call with no further needs.

## 2021-07-07 ENCOUNTER — Other Ambulatory Visit: Payer: Self-pay | Admitting: *Deleted

## 2021-07-07 DIAGNOSIS — I4821 Permanent atrial fibrillation: Secondary | ICD-10-CM

## 2021-07-12 ENCOUNTER — Other Ambulatory Visit: Payer: Self-pay

## 2021-07-12 ENCOUNTER — Other Ambulatory Visit (INDEPENDENT_AMBULATORY_CARE_PROVIDER_SITE_OTHER): Payer: Medicare (Managed Care)

## 2021-07-12 DIAGNOSIS — I4821 Permanent atrial fibrillation: Secondary | ICD-10-CM

## 2021-07-12 DIAGNOSIS — I428 Other cardiomyopathies: Secondary | ICD-10-CM

## 2021-07-12 NOTE — Telephone Encounter (Signed)
Dental form completed, faxed, and placed in my "Faxed" folder on my cart.

## 2021-07-13 ENCOUNTER — Other Ambulatory Visit: Payer: Self-pay | Admitting: Family

## 2021-07-13 ENCOUNTER — Telehealth: Payer: Self-pay | Admitting: *Deleted

## 2021-07-13 DIAGNOSIS — I4821 Permanent atrial fibrillation: Secondary | ICD-10-CM

## 2021-07-13 DIAGNOSIS — M170 Bilateral primary osteoarthritis of knee: Secondary | ICD-10-CM

## 2021-07-13 DIAGNOSIS — I428 Other cardiomyopathies: Secondary | ICD-10-CM

## 2021-07-13 DIAGNOSIS — I5042 Chronic combined systolic (congestive) and diastolic (congestive) heart failure: Secondary | ICD-10-CM

## 2021-07-13 LAB — COMPREHENSIVE METABOLIC PANEL
ALT: 15 IU/L (ref 0–44)
AST: 18 IU/L (ref 0–40)
Albumin/Globulin Ratio: 2 (ref 1.2–2.2)
Albumin: 4.7 g/dL (ref 3.8–4.8)
Alkaline Phosphatase: 57 IU/L (ref 44–121)
BUN/Creatinine Ratio: 17 (ref 10–24)
BUN: 23 mg/dL (ref 8–27)
Bilirubin Total: 0.8 mg/dL (ref 0.0–1.2)
CO2: 23 mmol/L (ref 20–29)
Calcium: 9.7 mg/dL (ref 8.6–10.2)
Chloride: 102 mmol/L (ref 96–106)
Creatinine, Ser: 1.37 mg/dL — ABNORMAL HIGH (ref 0.76–1.27)
Globulin, Total: 2.4 g/dL (ref 1.5–4.5)
Glucose: 96 mg/dL (ref 70–99)
Potassium: 5.1 mmol/L (ref 3.5–5.2)
Sodium: 143 mmol/L (ref 134–144)
Total Protein: 7.1 g/dL (ref 6.0–8.5)
eGFR: 58 mL/min/{1.73_m2} — ABNORMAL LOW (ref >59)

## 2021-07-13 LAB — DIGOXIN LEVEL: Digoxin, Serum: 0.5 ng/mL (ref 0.5–0.9)

## 2021-07-13 NOTE — Telephone Encounter (Signed)
Reviewed results and recommendations with patient. He verbalized understanding and was agreeable with plan. Orders entered and scheduled repeat labs. He verbalized understanding of our conversation, all questions were answered, and no further needs at this time.

## 2021-07-13 NOTE — Telephone Encounter (Signed)
-----   Message from Rise Mu, PA-C sent at 07/13/2021  7:33 AM EST ----- Glucose normal Renal function mildly elevated Liver function normal Potassium high normal Digoxin level okay  Recommendations: Please increase water intake If he is taking OTC potassium supplement, please have him stop Since he is no longer on lisinopril, please remove this from his medication list Follow-up BMET in 1 week to ensure renal function has returned back to baseline and he has a stable potassium

## 2021-07-15 ENCOUNTER — Telehealth: Payer: Self-pay | Admitting: Family Medicine

## 2021-07-15 NOTE — Telephone Encounter (Signed)
[name] from [facility], [phone] Ext #[phone]., fax [phone]. Looking at the 11 page paper work; On the face to face note, must have patient's height and weight, Reason for visit in part on the note it must say power wheel chair valuation. The paper work that says PPD sample, those are not sample answers those are the patient's answers. Transfers patient's answers from the PPD to PMD paper and fill in 5 blanks. Any questions call [facility], paper work  prefer to be received within 3 days of visit.

## 2021-07-19 ENCOUNTER — Encounter: Payer: Self-pay | Admitting: Family Medicine

## 2021-07-19 ENCOUNTER — Other Ambulatory Visit: Payer: Self-pay

## 2021-07-19 ENCOUNTER — Ambulatory Visit (INDEPENDENT_AMBULATORY_CARE_PROVIDER_SITE_OTHER): Payer: Medicare (Managed Care) | Admitting: Family Medicine

## 2021-07-19 DIAGNOSIS — M170 Bilateral primary osteoarthritis of knee: Secondary | ICD-10-CM | POA: Diagnosis not present

## 2021-07-19 DIAGNOSIS — I5032 Chronic diastolic (congestive) heart failure: Secondary | ICD-10-CM | POA: Diagnosis not present

## 2021-07-19 NOTE — Assessment & Plan Note (Signed)
Discussed that the increase in his creatinine was likely related to the [name].  Discussed that they may have him discontinue this medication if his kidney function does not return closer to his baseline when they recheck it tomorrow.

## 2021-07-19 NOTE — Progress Notes (Signed)
Carlos Rumps, MD Phone: [phone]  Carlos Crosby is a 66 y.o. male who presents today for wheel chair evaluation and follow-up.  Wheelchair evaluation: Patient presents for a wheelchair evaluation.  He reports he currently has a motorized wheelchair though it is 65 years old and reports they will replace this.  He has an appointment for physical therapy evaluation for his wheelchair in April.  He reports he is not chair or bedbound.  He has chronic knee pain for which he takes tramadol and this is generally beneficial.  This does affect his ability to get around and influences his ability to go to the bathroom to bathe on his own.  He treats his knee pain with topical treatments and Tylenol if those do not work he will take his tramadol.  He does not typically get drowsy with the tramadol.  CHF: The patient reports he recently started on Entresto.  His creatinine increased and they are planning on rechecking that tomorrow.  He notes no orthopnea, PND, or edema.  Social History   Tobacco Use  Smoking Status Never  Smokeless Tobacco Never    Current Outpatient Medications on File Prior to Visit  Medication Sig Dispense Refill   acetaminophen (TYLENOL) 500 MG tablet Take 1,000 mg by mouth every 6 (six) hours as needed.     Cholecalciferol (VITAMIN D) 2000 units CAPS daily.      digoxin (LANOXIN) 0.25 MG tablet Take 1 tablet (0.25 mg total) by mouth daily. 90 tablet 0   ELIQUIS 5 MG TABS tablet TAKE 1 TABLET TWICE DAILY 180 tablet 1   loratadine (CLARITIN) 10 MG tablet Take 10 mg by mouth daily.     metoprolol succinate (TOPROL-XL) 100 MG 24 hr tablet TAKE 2 TABLETS EVERY MORNING  AND TAKE 1 TABLET EVERY EVENING 270 tablet 3   Multiple Vitamins-Minerals (CENTRUM SILVER ADULT 50+) TABS Take 1 tablet by mouth daily.     pravastatin (PRAVACHOL) 80 MG tablet Take 1 tablet (80 mg total) by mouth every evening. PLEASE SCHEDULE APPOINTMENT WITH DR. Rockey Situ OFFICE TO CONTINUE GETTING REFILLS ON  MEDICATION. 30 tablet 0   sacubitril-valsartan (ENTRESTO) 24-26 MG Take 1 tablet by mouth 2 (two) times daily. 180 tablet 3   traMADol (ULTRAM) 50 MG tablet TAKE 1 TABLET(50 MG) BY MOUTH EVERY 6 HOURS AS NEEDED FOR PAIN 90 tablet 0   verapamil (CALAN-SR) 120 MG CR tablet TAKE 1 TABLET EVERY DAY 90 tablet 1   No current facility-administered medications on file prior to visit.     ROS see history of present illness  Objective  Physical Exam Vitals:   07/19/21 0941  BP: 120/80  Pulse: 87  Temp: 97.9 F (36.6 C)  SpO2: 98%    BP Readings from Last 3 Encounters:  07/19/21 120/80  05/05/21 124/84  03/29/21 130/80   Wt Readings from Last 3 Encounters:  07/19/21 (!) 314 lb (142.4 kg)  05/05/21 (!) 321 lb (145.6 kg)  03/29/21 (!) 309 lb 9.6 oz (140.4 kg)    Physical Exam Constitutional:      General: He is not in acute distress.    Appearance: He is not diaphoretic.  Cardiovascular:     Rate and Rhythm: Normal rate and regular rhythm.     Heart sounds: Normal heart sounds.  Pulmonary:     Effort: Pulmonary effort is normal.     Breath sounds: Normal breath sounds.  Musculoskeletal:     Comments: The patient has reduced knee flexion range of  motion left slightly greater than right, he has reduced hip flexion range of motion, he does lean forward while standing, the patient has intact range of motion on knee extension, plantarflexion, and dorsiflexion, at his shoulders, elbows, and wrists  Skin:    General: Skin is warm and dry.  Neurological:     Mental Status: He is alert.     Comments: 5/5 strength in bilateral biceps, triceps, grip, and 5/5 strength in the bilateral quads, hamstrings (range of motion is limited on knee flexion), plantar and dorsiflexion, sensation to light touch intact in bilateral UE and LE, normal gait, 2+ patellar reflexes     Assessment/Plan: Please see individual problem list.  Problem List Items Addressed This Visit     Chronic diastolic  congestive heart failure (HCC)    Discussed that the increase in his creatinine was likely related to the [facility].  Discussed that they may have him discontinue this medication if his kidney function does not return closer to his baseline when they recheck it tomorrow.      Osteoarthritis of both knees    Wheelchair evaluation completed.  Discussed his examination portion would need to be completed by physical therapy.  We will communicate this with his DME company.  He can continue as needed tramadol.  He will continue with over-the-counter Tylenol and topical pain treatments.        Return in about 3 months (around 10/16/2021) for chronic pain.  This visit occurred during the SARS-CoV-2 public health emergency.  Safety protocols were in place, including screening questions prior to the visit, additional usage of staff PPE, and extensive cleaning of exam room while observing appropriate contact time as indicated for disinfecting solutions.    Carlos Rumps, MD [place]

## 2021-07-19 NOTE — Patient Instructions (Signed)
Nice to see you. Please complete the physical therapy evaluation for your wheelchair.

## 2021-07-19 NOTE — Telephone Encounter (Signed)
Noted.  I understand what they are requesting.  I have all my patients go see a physical therapist for completion of a motorized wheelchair exam.  The patient reported he has an appointment in April for this.  I will need to see what that evaluation says prior to fully completing his paperwork.  Please relay this to the requesting company.  Thanks.

## 2021-07-19 NOTE — Assessment & Plan Note (Addendum)
Wheelchair evaluation completed.  Discussed his examination portion would need to be completed by physical therapy.  We will communicate this with his DME company.  He can continue as needed tramadol.  He will continue with over-the-counter Tylenol and topical pain treatments.

## 2021-07-20 ENCOUNTER — Other Ambulatory Visit (INDEPENDENT_AMBULATORY_CARE_PROVIDER_SITE_OTHER): Payer: Medicare (Managed Care)

## 2021-07-20 DIAGNOSIS — I4821 Permanent atrial fibrillation: Secondary | ICD-10-CM

## 2021-07-20 DIAGNOSIS — I428 Other cardiomyopathies: Secondary | ICD-10-CM | POA: Diagnosis not present

## 2021-07-20 DIAGNOSIS — I5042 Chronic combined systolic (congestive) and diastolic (congestive) heart failure: Secondary | ICD-10-CM

## 2021-07-20 NOTE — Telephone Encounter (Signed)
I called and LVM for [facility] to call back.  Ephrem Carrick,cma

## 2021-07-21 LAB — BASIC METABOLIC PANEL
BUN/Creatinine Ratio: 17 (ref 10–24)
BUN: 17 mg/dL (ref 8–27)
CO2: 24 mmol/L (ref 20–29)
Calcium: 9.5 mg/dL (ref 8.6–10.2)
Chloride: 97 mmol/L (ref 96–106)
Creatinine, Ser: 1.01 mg/dL (ref 0.76–1.27)
Glucose: 109 mg/dL — ABNORMAL HIGH (ref 70–99)
Potassium: 4.7 mmol/L (ref 3.5–5.2)
Sodium: 137 mmol/L (ref 134–144)
eGFR: 83 mL/min/{1.73_m2} (ref >59)

## 2021-07-22 ENCOUNTER — Encounter: Payer: Self-pay | Admitting: Family Medicine

## 2021-07-22 NOTE — Telephone Encounter (Signed)
I talked to [facility] medical and she asked me to send the note of the patent's last visit by fax and I sent the last visit note and confirmation was given. Nevaeh Korte,cma

## 2021-07-22 NOTE — Telephone Encounter (Signed)
I called and LVM for [facility] to return my call about the patients form.  Kamyah Wilhelmsen,cma

## 2021-08-01 ENCOUNTER — Encounter: Payer: Self-pay | Admitting: Orthopaedic Surgery

## 2021-08-04 ENCOUNTER — Other Ambulatory Visit: Payer: Self-pay | Admitting: *Deleted

## 2021-08-04 DIAGNOSIS — G4733 Obstructive sleep apnea (adult) (pediatric): Secondary | ICD-10-CM | POA: Diagnosis not present

## 2021-08-04 DIAGNOSIS — G4730 Sleep apnea, unspecified: Secondary | ICD-10-CM | POA: Diagnosis not present

## 2021-08-05 NOTE — Progress Notes (Signed)
?[facility]  ?[phone] [phone] ? ?ID: [name] OB: 05/12/1956  MR#: [id number]  DJM#:[id number] ? ?Patient Care Team: ?Leone Haven, MD as PCP - General (Family Medicine) ?Minna Merritts, MD as PCP - Cardiology (Cardiology) ?Deboraha Sprang, MD as PCP - Electrophysiology (Cardiology) ?Minna Merritts, MD as Consulting Physician (Cardiology) ? ? 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 ? ?INTERVAL HISTORY: Patient returns to clinic today for repeat laboratory work, further evaluation, and continuation of phlebotomy.  He currently feels well and is asymptomatic.  He has no neurologic complaints.  He denies any recent fevers or illnesses.  He denies any chest pain, shortness of breath, cough, or hemoptysis.  He denies any nausea, vomiting, constipation, or diarrhea.  He has no urinary complaints.  Patient offers no specific complaints today. ? ?REVIEW OF SYSTEMS:   ?Review of Systems  ?Constitutional: Negative.  Negative for fever, malaise/fatigue and weight loss.  ?Respiratory: Negative.  Negative for cough, hemoptysis and shortness of breath.   ?Cardiovascular: Negative.  Negative for chest pain and leg swelling.  ?Gastrointestinal: Negative.  Negative for abdominal pain, blood in stool and melena.  ?Genitourinary: Negative.  Negative for hematuria.  ?Musculoskeletal: Negative.  Negative for back pain.  ?Skin: Negative.  Negative for rash.  ?Neurological: Negative.  Negative for sensory change, focal weakness, weakness and headaches.  ?Psychiatric/Behavioral: Negative.  The patient is not nervous/anxious.   ? ?As per HPI. Otherwise, a complete review of systems is negative. ? ?PAST MEDICAL HISTORY: ?Past Medical History:  ?Diagnosis Date  ? Chronic a-fib ([place])   ? Chronic systolic CHF (congestive heart failure) ([place])   ? Depression   ? Hemochromatosis   ? History of kidney stones   ? History of pulmonary  embolism   ? Hyperlipidemia   ? Hypertension   ? Migraine   ? Obesity   ? OSA (obstructive sleep apnea)   ? on CPAP  ? Osteoarthritis   ? bilateral knees  ? Rocky [place] spotted fever 2011  ? ? ?PAST SURGICAL HISTORY: ?Past Surgical History:  ?Procedure Laterality Date  ? CARDIAC CATHETERIZATION  2012  ? [facility]  ? KNEE SURGERY    ? right knee   ? LITHOTRIPSY  07/31/2014  ? URETERAL STENT PLACEMENT  2016  ? URETEROSCOPY    ? ? ?FAMILY HISTORY: ?Family History  ?Problem Relation Age of Onset  ? Thyroid disease Mother   ? Cancer Father   ?     lung  ? Arthritis Father   ? Hypertension Father   ? Cancer Paternal Grandfather   ?     Throat cancer  ? ? ?ADVANCED DIRECTIVES (Y/N):  N ? ?HEALTH MAINTENANCE: ?Social History  ? ?Tobacco Use  ? Smoking status: Never  ? Smokeless tobacco: Never  ?Vaping Use  ? Vaping Use: Never used  ?Substance Use Topics  ? Alcohol use: No  ?  Comment: occasional; 3 times a year  ? Drug use: No  ? ? ? Colonoscopy: ? PAP: ? Bone density: ? Lipid panel: ? ?Allergies  ?Allergen Reactions  ? Allopurinol Hives and Swelling  ? Diltiazem Itching and Rash  ? Ondansetron Rash  ? Penicillins Other (See Comments) and Nausea Only  ?  Pt unsure of rxn; showed up on allergy test ?Pt unsure of rxn; showed up on allergy test ?Pt unsure of rxn; showed up on allergy test ?And dirrhea ?  ? ? ?Current Outpatient Medications  ?  Medication Sig Dispense Refill  ? acetaminophen (TYLENOL) 500 MG tablet Take 1,000 mg by mouth every 6 (six) hours as needed.    ? Cholecalciferol (VITAMIN D) 2000 units CAPS daily.     ? digoxin (LANOXIN) 0.25 MG tablet Take 1 tablet (0.25 mg total) by mouth daily. 90 tablet 0  ? ELIQUIS 5 MG TABS tablet TAKE 1 TABLET TWICE DAILY 180 tablet 1  ? loratadine (CLARITIN) 10 MG tablet Take 10 mg by mouth daily.    ? metoprolol succinate (TOPROL-XL) 100 MG 24 hr tablet TAKE 2 TABLETS EVERY MORNING  AND TAKE 1 TABLET EVERY EVENING 270 tablet 3  ? Multiple Vitamins-Minerals (CENTRUM SILVER ADULT 50+)  TABS Take 1 tablet by mouth daily.    ? pravastatin (PRAVACHOL) 80 MG tablet Take 1 tablet (80 mg total) by mouth every evening. PLEASE SCHEDULE APPOINTMENT WITH DR. Rockey Situ OFFICE TO CONTINUE GETTING REFILLS ON MEDICATION. 30 tablet 0  ? sacubitril-valsartan (ENTRESTO) 24-26 MG Take 1 tablet by mouth 2 (two) times daily. 180 tablet 3  ? traMADol (ULTRAM) 50 MG tablet TAKE 1 TABLET(50 MG) BY MOUTH EVERY 6 HOURS AS NEEDED FOR PAIN 90 tablet 0  ? verapamil (CALAN-SR) 120 MG CR tablet TAKE 1 TABLET EVERY DAY 90 tablet 1  ? ?No current facility-administered medications for this visit.  ? ? ?OBJECTIVE: ?Vitals:  ? 08/12/21 1402  ?BP: 118/79  ?Pulse: 88  ?Temp: 98.7 ?F (37.1 ?C)  ?   Body mass index is 43.36 kg/m?Marland Kitchen    ECOG FS:0 - Asymptomatic ? ?General: Well-developed, well-nourished, no acute distress. ?Eyes: Pink conjunctiva, anicteric sclera. ?HEENT: Normocephalic, moist mucous membranes. ?Lungs: No audible wheezing or coughing. ?Heart: Regular rate and rhythm. ?Abdomen: Soft, nontender, no obvious distention. ?Musculoskeletal: No edema, cyanosis, or clubbing. ?Neuro: Alert, answering all questions appropriately. Cranial nerves grossly intact. ?Skin: No rashes or petechiae noted. ?Psych: Normal affect. ? ?LAB RESULTS: ? ?Lab Results  ?Component Value Date  ? NA 137 07/20/2021  ? K 4.7 07/20/2021  ? CL 97 07/20/2021  ? CO2 24 07/20/2021  ? GLUCOSE 109 (H) 07/20/2021  ? BUN 17 07/20/2021  ? CREATININE 1.01 07/20/2021  ? CALCIUM 9.5 07/20/2021  ? PROT 7.1 07/12/2021  ? ALBUMIN 4.7 07/12/2021  ? AST 18 07/12/2021  ? ALT 15 07/12/2021  ? ALKPHOS 57 07/12/2021  ? BILITOT 0.8 07/12/2021  ? GFRNONAA >60 10/11/2020  ? GFRAA 107 09/05/2019  ? ? ?Lab Results  ?Component Value Date  ? WBC 6.8 08/10/2021  ? NEUTROABS 3.2 08/10/2021  ? HGB 17.5 (H) 08/10/2021  ? HCT 49.4 08/10/2021  ? MCV 96.7 08/10/2021  ? PLT 188 08/10/2021  ? ?Lab Results  ?Component Value Date  ? IRON 158 08/10/2021  ? TIBC 365 08/10/2021  ? IRONPCTSAT 43 (H)  08/10/2021  ? ?Lab Results  ?Component Value Date  ? FERRITIN 112 08/10/2021  ? ? ? ?STUDIES: ?No results found. ? 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 ?PLAN:   ? ?1. 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  Patient's hemoglobin and ferritin have trended up. Previously, the remainder of his laboratory work is either negative or within normal limits.  It appears patient requires phlebotomy approximately every 3 months.  Proceed with 500 mL phlebotomy today.  Return to clinic in 3 months for laboratory work and phlebotomy only.  Patient will then return to clinic in 6 months for laboratory work, further evaluation, and continuation of treatment  if needed.  Of note, because patient is on Eliquis he cannot receive his phlebotomy through [facility].   ?2.  Polycythemia: Likely secondary to elevated iron stores.  Phlebotomy as above. ? ?I spent a total of 20 minutes reviewing chart data, face-to-face evaluation with the patient, counseling and coordination of care as detailed above. ? ? ?Patient expressed understanding and was in agreement with this plan. He also understands that He can call clinic at any time with any questions, concerns, or complaints.  ? ? ?Lloyd Huger, MD   08/12/2021 3:13 PM ? ? ? ? ?

## 2021-08-09 ENCOUNTER — Other Ambulatory Visit: Payer: Self-pay

## 2021-08-09 ENCOUNTER — Ambulatory Visit (INDEPENDENT_AMBULATORY_CARE_PROVIDER_SITE_OTHER): Payer: Medicare (Managed Care)

## 2021-08-09 VITALS — Ht 71.0 in | Wt 314.0 lb

## 2021-08-09 DIAGNOSIS — Z0000 Encounter for general adult medical examination without abnormal findings: Secondary | ICD-10-CM

## 2021-08-09 NOTE — Progress Notes (Signed)
Subjective:   Carlos Crosby is a 66 y.o. male who presents for Medicare Annual/Subsequent preventive examination.  Review of Systems    No ROS.  Medicare Wellness Virtual Visit.  Visual/audio telehealth visit, UTA vital signs.   See social history for additional risk factors.   Cardiac Risk Factors include: advanced age (>7mn, >>43women);male gender     Objective:    Today's Vitals   08/09/21 0827  Weight: (!) 314 lb (142.4 kg)  Height: '5\' 11"'$  (1.803 m)   Body mass index is 43.79 kg/m.  Advanced Directives 08/09/2021 02/11/2021 10/11/2020 08/06/2020 08/06/2020 01/22/2020 08/06/2019  Does Patient Have a Medical Advance Directive? Yes No No No No No No  Type of Advance Directive Living will - - - - - -  Does patient want to make changes to medical advance directive? No - Patient declined - - No - Patient declined No - Patient declined - -  Copy of HFanshawein Chart? No - copy requested - - - - - -  Would patient like information on creating a medical advance directive? - No - Patient declined - - - No - Patient declined No - Patient declined   Current Medications (verified) Outpatient Encounter Medications as of 08/09/2021  Medication Sig   acetaminophen (TYLENOL) 500 MG tablet Take 1,000 mg by mouth every 6 (six) hours as needed.   Cholecalciferol (VITAMIN D) 2000 units CAPS daily.    digoxin (LANOXIN) 0.25 MG tablet Take 1 tablet (0.25 mg total) by mouth daily.   ELIQUIS 5 MG TABS tablet TAKE 1 TABLET TWICE DAILY   loratadine (CLARITIN) 10 MG tablet Take 10 mg by mouth daily.   metoprolol succinate (TOPROL-XL) 100 MG 24 hr tablet TAKE 2 TABLETS EVERY MORNING  AND TAKE 1 TABLET EVERY EVENING   Multiple Vitamins-Minerals (CENTRUM SILVER ADULT 50+) TABS Take 1 tablet by mouth daily.   pravastatin (PRAVACHOL) 80 MG tablet Take 1 tablet (80 mg total) by mouth every evening. PLEASE SCHEDULE APPOINTMENT WITH DR. GRockey [facility] TO CONTINUE GETTING REFILLS ON MEDICATION.    sacubitril-valsartan (ENTRESTO) 24-26 MG Take 1 tablet by mouth 2 (two) times daily.   traMADol (ULTRAM) 50 MG tablet TAKE 1 TABLET(50 MG) BY MOUTH EVERY 6 HOURS AS NEEDED FOR PAIN   verapamil (CALAN-SR) 120 MG CR tablet TAKE 1 TABLET EVERY DAY   No facility-administered encounter medications on file as of 08/09/2021.   Allergies (verified) Allopurinol, Diltiazem, Ondansetron, and Penicillins   History: Past Medical History:  Diagnosis Date   Chronic a-fib (HCC)    Chronic systolic CHF (congestive heart failure) (HCC)    Depression    Hemochromatosis    History of kidney stones    History of pulmonary embolism    Hyperlipidemia    Hypertension    Migraine    Obesity    OSA (obstructive sleep apnea)    on CPAP   Osteoarthritis    bilateral knees   [place] spotted fever 2011   Past Surgical History:  Procedure Laterality Date   [facility]  KN[facility] SURGERY     right knee    LITHOTRIPSY  07/31/2014   URETERAL STENT PLACEMENT  2016   URETEROSCOPY     Family History  Problem Relation Age of Onset   Thyroid disease Mother    Cancer Father        lung   Arthritis Father    Hypertension Father  Cancer Paternal Grandfather        Throat cancer   Social History   Socioeconomic History   Marital status: Married    Spouse name: Not on file   Number of children: Not on file   Years of education: Not on file   Highest education level: Not on file  Occupational History   Not on file  Tobacco Use   Smoking status: Never   Smokeless tobacco: Never  Vaping Use   Vaping Use: Never used  Substance and Sexual Activity   Alcohol use: No    Comment: occasional; 3 times a year   Drug use: No   Sexual activity: Not Currently  Other Topics Concern   Not on file  Social History Narrative   Not on file   Social Determinants of Health   Financial Resource Strain: Low Risk    Difficulty of Paying Living Expenses: Not hard at all  Food  Insecurity: No Food Insecurity   Worried About Charity fundraiser in the Last Year: Never true   [employer] in the Last Year: Never true  Transportation Needs: No Transportation Needs   Lack of Transportation (Medical): No   Lack of Transportation (Non-Medical): No  Physical Activity: Sufficiently Active   Days of Exercise per Week: 3 days   Minutes of Exercise per Session: 90 min  Stress: No Stress Concern Present   Feeling of Stress : Not at all  Social Connections: Unknown   Frequency of Communication with Friends and Family: More than three times a week   Frequency of Social Gatherings with Friends and Family: More than three times a week   Attends Religious Services: Not on Electrical engineer or Organizations: Yes   Attends Music therapist: More than 4 times per year   Marital Status: Not on file    Tobacco Counseling Counseling given: Not Answered  Clinical Intake:  Pre-visit preparation completed: Yes        Diabetes: No  How often do you need to have someone help you when you read instructions, pamphlets, or other written materials from your doctor or pharmacy?: 1 - Never    Interpreter Needed?: No    Activities of Daily Living In your present state of health, do you have any difficulty performing the following activities: 08/09/2021  Hearing? N  Vision? N  Difficulty concentrating or making decisions? N  Walking or climbing stairs? Y  Comment Unsteady gait. Cane/wheelchair in use when ambulating.  Dressing or bathing? N  Doing errands, shopping? N  Preparing Food and eating ? N  Using the Toilet? N  In the past six months, have you accidently leaked urine? N  Do you have problems with loss of bowel control? N  Managing your Medications? N  Managing your Finances? N  Some recent data might be hidden   Patient Care Team: Leone Haven, MD as PCP - General (Family Medicine) Minna Merritts, MD as PCP - Cardiology  (Cardiology) Deboraha Sprang, MD as PCP - Electrophysiology (Cardiology) Minna Merritts, MD as Consulting Physician (Cardiology)  Indicate any recent Medical Services you may have received from other than [facility] providers in the past year (date may be approximate).     Assessment:   This is a routine wellness examination for Carlos Crosby.  Virtual Visit via Telephone Note  I connected with  Carlos Crosby on 08/09/21 at  8:15 AM EST by telephone and  verified that I am speaking with the correct person using two identifiers.  Persons participating in the virtual visit: patient/Nurse Health Advisor   I discussed the limitations, risks, security and privacy concerns of performing an evaluation and management service by telephone and the availability of in person appointments. The patient expressed understanding and agreed to proceed.  Interactive audio and video telecommunications were attempted between this nurse and patient, however failed, due to patient having technical difficulties OR patient did not have access to video capability.  We continued and completed visit with audio only.  Some vital signs may be absent or patient reported.   Hearing/Vision screen Hearing Screening - Comments:: Patient is able to hear conversational tones without difficulty.  No issues reported. Vision Screening - Comments:: Followed by [facility]  Wears corrective lenses when driving  They have regular follow up with the ophthalmologist  Dietary issues and exercise activities discussed: Current Exercise Habits: Home exercise routine, Type of exercise: strength training/weights (Swimming/water aerobics), Intensity: Mild   Goals Addressed               This Visit's Progress     Patient Stated     Weight (lb) < 220 lb (99.8 kg) (pt-stated)   314 lb (142.4 kg)     Stay active Portion control meals Stay hydrated with water       Depression Screen PHQ 2/9 Scores 08/09/2021 07/19/2021 08/06/2020  02/25/2020 10/18/2019 10/08/2019 08/06/2019  PHQ - 2 Score 0 0 0 0 0 0 0    Fall Risk Fall Risk  08/09/2021 07/19/2021 03/29/2021 08/06/2020 02/25/2020  Falls in the past year? 0 0 0 0 1  Number falls in past yr: 0 0 0 0 0  Injury with Fall? - 0 - 0 0  Risk for fall due to : - No Fall Risks - - -  Follow up Falls evaluation completed Falls evaluation completed Falls evaluation completed Falls evaluation completed Falls evaluation completed    [place]: Home free of loose throw rugs in walkways, pet beds, electrical cords, etc? Yes  Adequate lighting in your home to reduce risk of falls? Yes   ASSISTIVE DEVICES UTILIZED TO PREVENT FALLS: Use of a cane, walker or w/c? Yes   TIMED UP AND GO: Was the test performed? No .   Cognitive Function: Patient is alert and oriented x3.  MMSE - Mini Mental State Exam 07/24/2018 07/21/2017 07/20/2016  Orientation to time '5 5 5  '$ Orientation to Place '5 5 5  '$ Registration '3 3 3  '$ Attention/ Calculation '5 5 5  '$ Recall '3 3 2  '$ Recall-comments - - 2 out of 3 words recalled  Language- name 2 objects '2 2 2  '$ Language- repeat '1 1 1  '$ Language- follow 3 step command '3 3 3  '$ Language- read & follow direction '1 1 1  '$ Write a sentence '1 1 1  '$ Copy design '1 1 1  '$ Total score '30 30 29       '$ Immunizations Immunization History  Administered Date(s) Administered   Influenza,inj,Quad PF,6+ Mos 03/20/2013, 02/27/2015, 01/26/2017, 02/19/2018, 02/05/2019, 02/25/2020, 02/19/2021   Influenza-Unspecified 03/08/2014, 02/26/2016, 02/17/2018, 02/19/2018   Moderna SARS-COV2 Booster Vaccination 09/01/2020, 02/19/2021   Moderna Sars-Covid-2 Vaccination 08/03/2019, 08/31/2019, 03/30/2020   Pneumococcal Polysaccharide-23 05/06/2015   TDAP status: Due, Education has been provided regarding the importance of this vaccine. Advised may receive this vaccine at local pharmacy or Health Dept. Aware to provide a copy of the vaccination record  if obtained from  local pharmacy or Health Dept. Verbalized acceptance and understanding. Deferred.   Shingrix Completed?: No.    Education has been provided regarding the importance of this vaccine. Patient has been advised to call insurance company to determine out of pocket expense if they have not yet received this vaccine. Advised may also receive vaccine at local pharmacy or Health Dept. Verbalized acceptance and understanding.  Screening Tests Health Maintenance  Topic Date Due   COVID-19 Vaccine (4 - Booster for Moderna series) 08/25/2021 (Originally 04/16/2021)   Zoster Vaccines- Shingrix (1 of 2) 11/09/2021 (Originally 01/19/1975)   TETANUS/TDAP  08/10/2022 (Originally 01/19/1975)   Fecal DNA (Cologuard)  08/21/2023   INFLUENZA VACCINE  Completed   Hepatitis C Screening  Completed   HPV VACCINES  Aged Out   HIV Screening  Discontinued   Health Maintenance There are no preventive care reminders to display for this patient.  Lung Cancer Screening: (Low Dose CT Chest recommended if Age 21-80 years, 30 pack-year currently smoking OR have quit w/in 15years.) does not qualify.   Vision Screening: Recommended annual ophthalmology exams for early detection of glaucoma and other disorders of the eye.  Dental Screening: Recommended annual dental exams for proper oral hygiene  Community Resource Referral / Chronic Care Management: CRR required this visit?  No   CCM required this visit?  No      Plan:   Keep all routine maintenance appointments.   I have personally reviewed and noted the following in the patients chart:   Medical and social history Use of alcohol, tobacco or illicit drugs  Current medications and supplements including opioid prescriptions. Patient is currently taking opioid prescriptions. Information provided to patient regarding non-opioid alternatives. Patient advised to discuss non-opioid treatment plan with their provider. Taking Tramadol. Followed by PCP.  Functional ability  and status Nutritional status Physical activity Advanced directives List of other physicians Hospitalizations, surgeries, and ER visits in previous 12 months Vitals Screenings to include cognitive, depression, and falls Referrals and appointments  In addition, I have reviewed and discussed with patient certain preventive protocols, quality metrics, and best practice recommendations. A written personalized care plan for preventive services as well as general preventive health recommendations were provided to patient via mychart.     Varney Biles, LPN   06/11/9448

## 2021-08-09 NOTE — Patient Instructions (Addendum)
Carlos Crosby , Thank you for taking time to come for your Medicare Wellness Visit. I appreciate your ongoing commitment to your health goals. Please review the following plan we discussed and let me know if I can assist you in the future.   These are the goals we discussed:  Goals       Patient Stated     Weight (lb) < 220 lb (99.8 kg) (pt-stated)      Stay active Portion control meals Stay hydrated with water        This is a list of the screening recommended for you and due dates:  Health Maintenance  Topic Date Due   COVID-19 Vaccine (4 - Booster for Moderna series) 08/25/2021*   Zoster (Shingles) Vaccine (1 of 2) 11/09/2021*   Tetanus Vaccine  08/10/2022*   Cologuard (Stool DNA test)  08/21/2023   Flu Shot  Completed   Hepatitis C Screening: USPSTF Recommendation to screen - Ages 18-79 yo.  Completed   HPV Vaccine  Aged Out   HIV Screening  Discontinued  *Topic was postponed. The date shown is not the original due date.    Advanced directives: End of life planning; Advance aging; Advanced directives discussed.  Copy of current HCPOA/Living Will requested.    Conditions/risks identified: none new.  Follow up in one year for your annual wellness visit   Preventive Care 40-64 Years, Male Preventive care refers to lifestyle choices and visits with your health care provider that can promote health and wellness. What does preventive care include? A yearly physical exam. This is also called an annual well check. Dental exams once or twice a year. Routine eye exams. Ask your health care provider how often you should have your eyes checked. Personal lifestyle choices, including: Daily care of your teeth and gums. Regular physical activity. Eating a healthy diet. Avoiding tobacco and drug use. Limiting alcohol use. Practicing safe sex. Taking low-dose aspirin every day starting at age 38. What happens during an annual well check? The services and screenings done by your  health care provider during your annual well check will depend on your age, overall health, lifestyle risk factors, and family history of disease. Counseling  Your health care provider may ask you questions about your: Alcohol use. Tobacco use. Drug use. Emotional well-being. Home and relationship well-being. Sexual activity. Eating habits. Work and work Statistician. Screening  You may have the following tests or measurements: Height, weight, and BMI. Blood pressure. Lipid and cholesterol levels. These may be checked every 5 years, or more frequently if you are over 58 years old. Skin check. Lung cancer screening. You may have this screening every year starting at age 17 if you have a 30-pack-year history of smoking and currently smoke or have quit within the past 15 years. Fecal occult blood test (FOBT) of the stool. You may have this test every year starting at age 55. Flexible sigmoidoscopy or colonoscopy. You may have a sigmoidoscopy every 5 years or a colonoscopy every 10 years starting at age 57. Prostate cancer screening. Recommendations will vary depending on your family history and other risks. Hepatitis C blood test. Hepatitis B blood test. Sexually transmitted disease (STD) testing. Diabetes screening. This is done by checking your blood sugar (glucose) after you have not eaten for a while (fasting). You may have this done every 1-3 years. Discuss your test results, treatment options, and if necessary, the need for more tests with your health care provider. Vaccines  Your health care  provider may recommend certain vaccines, such as: Influenza vaccine. This is recommended every year. Tetanus, diphtheria, and acellular pertussis (Tdap, Td) vaccine. You may need a Td booster every 10 years. Zoster vaccine. You may need this after age 31. Pneumococcal 13-valent conjugate (PCV13) vaccine. You may need this if you have certain conditions and have not been vaccinated. Pneumococcal  polysaccharide (PPSV23) vaccine. You may need one or two doses if you smoke cigarettes or if you have certain conditions. Talk to your health care provider about which screenings and vaccines you need and how often you need them. This information is not intended to replace advice given to you by your health care provider. Make sure you discuss any questions you have with your health care provider. Document Released: 06/19/2015 Document Revised: 02/10/2016 Document Reviewed: 03/24/2015 Elsevier Interactive Patient Education  2017 [place] Prevention in the Home Falls can cause injuries. They can happen to people of all ages. There are many things you can do to make your home safe and to help prevent falls. What can I do on the outside of my home? Regularly fix the edges of walkways and driveways and fix any cracks. Remove anything that might make you trip as you walk through a door, such as a raised step or threshold. Trim any bushes or trees on the path to your home. Use bright outdoor lighting. Clear any walking paths of anything that might make someone trip, such as rocks or tools. Regularly check to see if handrails are loose or broken. Make sure that both sides of any steps have handrails. Any raised decks and porches should have guardrails on the edges. Have any leaves, snow, or ice cleared regularly. Use sand or salt on walking paths during winter. Clean up any spills in your garage right away. This includes oil or grease spills. What can I do in the bathroom? Use night lights. Install grab bars by the toilet and in the tub and shower. Do not use towel bars as grab bars. Use non-skid mats or decals in the tub or shower. If you need to sit down in the shower, use a plastic, non-slip stool. Keep the floor dry. Clean up any water that spills on the floor as soon as it happens. Remove soap buildup in the tub or shower regularly. Attach bath mats securely with double-sided  non-slip rug tape. Do not have throw rugs and other things on the floor that can make you trip. What can I do in the bedroom? Use night lights. Make sure that you have a light by your bed that is easy to reach. Do not use any sheets or blankets that are too big for your bed. They should not hang down onto the floor. Have a firm chair that has side arms. You can use this for support while you get dressed. Do not have throw rugs and other things on the floor that can make you trip. What can I do in the kitchen? Clean up any spills right away. Avoid walking on wet floors. Keep items that you use a lot in easy-to-reach places. If you need to reach something above you, use a strong step stool that has a grab bar. Keep electrical cords out of the way. Do not use floor polish or wax that makes floors slippery. If you must use wax, use non-skid floor wax. Do not have throw rugs and other things on the floor that can make you trip. What can I do with my  stairs? Do not leave any items on the stairs. Make sure that there are handrails on both sides of the stairs and use them. Fix handrails that are broken or loose. Make sure that handrails are as long as the stairways. Check any carpeting to make sure that it is firmly attached to the stairs. Fix any carpet that is loose or worn. Avoid having throw rugs at the top or bottom of the stairs. If you do have throw rugs, attach them to the floor with carpet tape. Make sure that you have a light switch at the top of the stairs and the bottom of the stairs. If you do not have them, ask someone to add them for you. What else can I do to help prevent falls? Wear shoes that: Do not have high heels. Have rubber bottoms. Are comfortable and fit you well. Are closed at the toe. Do not wear sandals. If you use a stepladder: Make sure that it is fully opened. Do not climb a closed stepladder. Make sure that both sides of the stepladder are locked into place. Ask  someone to hold it for you, if possible. Clearly mark and make sure that you can see: Any grab bars or handrails. First and last steps. Where the edge of each step is. Use tools that help you move around (mobility aids) if they are needed. These include: Canes. Walkers. Scooters. Crutches. Turn on the lights when you go into a dark area. Replace any light bulbs as soon as they burn out. Set up your furniture so you have a clear path. Avoid moving your furniture around. If any of your floors are uneven, fix them. If there are any pets around you, be aware of where they are. Review your medicines with your doctor. Some medicines can make you feel dizzy. This can increase your chance of falling. Ask your doctor what other things that you can do to help prevent falls. This information is not intended to replace advice given to you by your health care provider. Make sure you discuss any questions you have with your health care provider. Document Released: 03/19/2009 Document Revised: 10/29/2015 Document Reviewed: 06/27/2014 Elsevier Interactive Patient Education  2017 [place].  Opioid Pain Medicine Management Opioids are powerful medicines that are used to treat moderate to severe pain. When used for short periods of time, they can help you to: Sleep better. Do better in physical or occupational therapy. Feel better in the first few days after an injury. Recover from surgery. Opioids should be taken with the supervision of a trained health care provider. They should be taken for the shortest period of time possible. This is because opioids can be addictive, and the longer you take opioids, the greater your risk of addiction. This addiction can also be called opioid use disorder. What are the risks? Using opioid pain medicines for longer than 3 days increases your risk of side effects. Side effects include: Constipation. Nausea and vomiting. Breathing difficulties (respiratory  depression). Drowsiness. Confusion. Opioid use disorder. Itching. Taking opioid pain medicine for a long period of time can affect your ability to do daily tasks. It also puts you at risk for: Motor vehicle crashes. Depression. Suicide. Heart attack. Overdose, which can be life-threatening. What is a pain treatment plan? A pain treatment plan is an agreement between you and your health care provider. Pain is unique to each person, and treatments vary depending on your condition. To manage your pain, you and your health care provider  need to work together. To help you do this: Discuss the goals of your treatment, including how much pain you might expect to have and how you will manage the pain. Review the risks and benefits of taking opioid medicines. Remember that a good treatment plan uses more than one approach and minimizes the chance of side effects. Be honest about the amount of medicines you take and about any drug or alcohol use. Get pain medicine prescriptions from only one health care provider. Pain can be managed with many types of alternative treatments. Ask your health care provider to refer you to one or more specialists who can help you manage pain through: Physical or occupational therapy. Counseling (cognitive behavioral therapy). Good nutrition. Biofeedback. Massage. Meditation. Non-opioid medicine. Following a gentle exercise program. How to use opioid pain medicine Taking medicine Take your pain medicine exactly as told by your health care provider. Take it only when you need it. If your pain gets less severe, you may take less than your prescribed dose if your health care provider approves. If you are not having pain, do nottake pain medicine unless your health care provider tells you to take it. If your pain is severe, do nottry to treat it yourself by taking more pills than instructed on your prescription. Contact your health care provider for help. Write down  the times when you take your pain medicine. It is easy to become confused while on pain medicine. Writing the time can help you avoid overdose. Take other over-the-counter or prescription medicines only as told by your health care provider. Keeping yourself and others safe  While you are taking opioid pain medicine: Do not drive, use machinery, or power tools. Do not sign legal documents. Do not drink alcohol. Do not take sleeping pills. Do not supervise children by yourself. Do not do activities that require climbing or being in high places. Do not go to a lake, river, ocean, spa, or swimming pool. Do not share your pain medicine with anyone. Keep pain medicine in a locked cabinet or in a secure area where pets and children cannot reach it. Stopping your use of opioids If you have been taking opioid medicine for more than a few weeks, you may need to slowly decrease (taper) how much you take until you stop completely. Tapering your use of opioids can decrease your risk of symptoms of withdrawal, such as: Pain and cramping in the abdomen. Nausea. Sweating. Sleepiness. Restlessness. Uncontrollable shaking (tremors). Cravings for the medicine. Do not attempt to taper your use of opioids on your own. Talk with your health care provider about how to do this. Your health care provider may prescribe a step-down schedule based on how much medicine you are taking and how long you have been taking it. Getting rid of leftover pills Do not save any leftover pills. Get rid of leftover pills safely by: Taking the medicine to a prescription take-back program. This is usually offered by the county or law enforcement. Bringing them to a pharmacy that has a drug disposal container. Flushing them down the toilet. Check the label or package insert of your medicine to see whether this is safe to do. Throwing them out in the trash. Check the label or package insert of your medicine to see whether this is  safe to do. If it is safe to throw it out, remove the medicine from the original container, put it into a sealable bag or container, and mix it with used coffee grounds, food  scraps, dirt, or cat litter before putting it in the trash. Follow these instructions at home: Activity Do exercises as told by your health care provider. Avoid activities that make your pain worse. Return to your normal activities as told by your health care provider. Ask your health care provider what activities are safe for you. General instructions You may need to take these actions to prevent or treat constipation: Drink enough fluid to keep your urine pale yellow. Take over-the-counter or prescription medicines. Eat foods that are high in fiber, such as beans, whole grains, and fresh fruits and vegetables. Limit foods that are high in fat and processed sugars, such as fried or sweet foods. Keep all follow-up visits. This is important. Where to find support If you have been taking opioids for a long time, you may benefit from receiving support for quitting from a local support group or counselor. Ask your health care provider for a referral to these resources in your area. Where to find more information Centers for Disease Control and Prevention (CDC): http://www.wolf.info/ U.[place]. Food and Drug Administration (FDA): [place] Get help right away if: You may have taken too much of an opioid (overdosed). Common symptoms of an overdose: Your breathing is slower or more shallow than normal. You have a very slow heartbeat (pulse). You have slurred speech. You have nausea and vomiting. Your pupils become very small. You have other potential symptoms: You are very confused. You faint or feel like you will faint. You have cold, clammy skin. You have blue lips or fingernails. You have thoughts of harming yourself or harming others. These symptoms may represent a serious problem that is an emergency. Do not wait to see if the  symptoms will go away. Get medical help right away. Call your local emergency services (911 in the [place] Do not drive yourself to the hospital.  If you ever feel like you may hurt yourself or others, or have thoughts about taking your own life, get help right away. Go to your nearest emergency department or: Call your local emergency services (911 in the [place] Call the [facility] (434) 546-4395 in the [place].). Call a suicide crisis helpline, such as the [facility] at (413)075-6052 or 988 in the [place]. This is open 24 hours a day in the [place] Text the Crisis Text Line at 779-336-6271 (in the [place].). Summary Opioid medicines can help you manage moderate to severe pain for a short period of time. A pain treatment plan is an agreement between you and your health care provider. Discuss the goals of your treatment, including how much pain you might expect to have and how you will manage the pain. If you think that you or someone else may have taken too much of an opioid, get medical help right away. This information is not intended to replace advice given to you by your health care provider. Make sure you discuss any questions you have with your health care provider. Document Revised: 12/16/2020 Document Reviewed: 09/02/2020 Elsevier Patient Education  [place].

## 2021-08-10 ENCOUNTER — Inpatient Hospital Stay: Payer: Medicare (Managed Care) | Attending: Oncology

## 2021-08-10 ENCOUNTER — Encounter: Payer: Self-pay | Admitting: Cardiovascular Disease

## 2021-08-10 DIAGNOSIS — D751 Secondary polycythemia: Secondary | ICD-10-CM | POA: Diagnosis not present

## 2021-08-10 DIAGNOSIS — Z7901 Long term (current) use of anticoagulants: Secondary | ICD-10-CM | POA: Diagnosis not present

## 2021-08-10 DIAGNOSIS — Z79899 Other long term (current) drug therapy: Secondary | ICD-10-CM | POA: Diagnosis not present

## 2021-08-10 DIAGNOSIS — Z86711 Personal history of pulmonary embolism: Secondary | ICD-10-CM | POA: Diagnosis not present

## 2021-08-10 LAB — CBC WITH DIFFERENTIAL/PLATELET
Abs Immature Granulocytes: 0.03 10*3/uL (ref 0.00–0.07)
Basophils Absolute: 0.1 10*3/uL (ref 0.0–0.1)
Basophils Relative: 1 %
Eosinophils Absolute: 0.2 10*3/uL (ref 0.0–0.5)
Eosinophils Relative: 3 %
HCT: 49.4 % (ref 39.0–52.0)
Hemoglobin: 17.5 g/dL — ABNORMAL HIGH (ref 13.0–17.0)
Immature Granulocytes: 0 %
Lymphocytes Relative: 39 %
Lymphs Abs: 2.6 10*3/uL (ref 0.7–4.0)
MCH: 34.2 pg — ABNORMAL HIGH (ref 26.0–34.0)
MCHC: 35.4 g/dL (ref 30.0–36.0)
MCV: 96.7 fL (ref 80.0–100.0)
Monocytes Absolute: 0.7 10*3/uL (ref 0.1–1.0)
Monocytes Relative: 10 %
Neutro Abs: 3.2 10*3/uL (ref 1.7–7.7)
Neutrophils Relative %: 47 %
Platelets: 188 10*3/uL (ref 150–400)
RBC: 5.11 MIL/uL (ref 4.22–5.81)
RDW: 12.6 % (ref 11.5–15.5)
WBC: 6.8 10*3/uL (ref 4.0–10.5)
nRBC: 0 % (ref 0.0–0.2)

## 2021-08-10 LAB — IRON AND TIBC
Iron: 158 ug/dL (ref 45–182)
Saturation Ratios: 43 % — ABNORMAL HIGH (ref 17.9–39.5)
TIBC: 365 ug/dL (ref 250–450)
UIBC: 207 ug/dL

## 2021-08-10 LAB — FERRITIN: Ferritin: 112 ng/mL (ref 24–336)

## 2021-08-11 ENCOUNTER — Other Ambulatory Visit: Payer: Self-pay

## 2021-08-11 MED ORDER — METOPROLOL SUCCINATE ER 100 MG PO TB24: ORAL_TABLET | ORAL | 3 refills | Status: DC

## 2021-08-12 ENCOUNTER — Other Ambulatory Visit: Payer: Self-pay

## 2021-08-12 ENCOUNTER — Inpatient Hospital Stay (HOSPITAL_BASED_OUTPATIENT_CLINIC_OR_DEPARTMENT_OTHER): Payer: Medicare (Managed Care) | Admitting: Oncology

## 2021-08-12 ENCOUNTER — Inpatient Hospital Stay: Payer: Medicare (Managed Care)

## 2021-08-12 ENCOUNTER — Encounter: Payer: Self-pay | Admitting: Oncology

## 2021-08-12 NOTE — Patient Instructions (Signed)

## 2021-08-12 NOTE — Progress Notes (Signed)
Performed phlebotomy today; Removed 400 ml of blood. Tolerated procedure well. Pt reports feeling well at his baseline. Discharged to home. ?

## 2021-08-17 ENCOUNTER — Other Ambulatory Visit: Payer: Self-pay | Admitting: Family

## 2021-08-17 ENCOUNTER — Encounter: Payer: Self-pay | Admitting: Family Medicine

## 2021-08-17 DIAGNOSIS — M170 Bilateral primary osteoarthritis of knee: Secondary | ICD-10-CM

## 2021-08-17 MED ORDER — TRAMADOL HCL 50 MG PO TABS: ORAL_TABLET | ORAL | 0 refills | Status: DC

## 2021-08-26 ENCOUNTER — Telehealth: Payer: Self-pay

## 2021-08-26 ENCOUNTER — Ambulatory Visit (INDEPENDENT_AMBULATORY_CARE_PROVIDER_SITE_OTHER): Payer: Medicare (Managed Care) | Admitting: Orthopaedic Surgery

## 2021-08-26 ENCOUNTER — Encounter: Payer: Self-pay | Admitting: Orthopaedic Surgery

## 2021-08-26 ENCOUNTER — Other Ambulatory Visit: Payer: Self-pay

## 2021-08-26 DIAGNOSIS — M170 Bilateral primary osteoarthritis of knee: Secondary | ICD-10-CM

## 2021-08-26 DIAGNOSIS — M25561 Pain in right knee: Secondary | ICD-10-CM | POA: Diagnosis not present

## 2021-08-26 DIAGNOSIS — M25562 Pain in left knee: Secondary | ICD-10-CM | POA: Diagnosis not present

## 2021-08-26 MED ORDER — BUPIVACAINE HCL 0.25 % IJ SOLN
2.0000 mL | INTRAMUSCULAR | Status: AC | PRN
  Administered 2021-08-26: 2 mL via INTRA_ARTICULAR

## 2021-08-26 MED ORDER — LIDOCAINE HCL 1 % IJ SOLN
2.0000 mL | INTRAMUSCULAR | Status: AC | PRN
  Administered 2021-08-26: 2 mL

## 2021-08-26 NOTE — Progress Notes (Signed)
? ?Office Visit Note ?  ?Patient: Carlos Crosby           ?Date of Birth: [birth year]           ?MRN: [id number] ?Visit Date: 08/26/2021 ?             ?Requested by: Carlos Haven, MD ?Carlos Crosby ?[address] ?[place],   [id number] ?PCP: Carlos Haven, MD ? ? ?Assessment & Plan: ?Visit Diagnoses:  ?1. Primary osteoarthritis of both knees   ?2. Morbid (severe) obesity due to excess calories ([place])   ? ? ?Plan: Carlos Crosby has advanced end-stage osteoarthritis of both of his knees with varus deformities.  He does use a cane for ambulation.  I have seen him on numerous occasions for cortisone injections which give him some temporary relief.  He is also in the midst of a weight loss program.  His top weight was 310 pounds and he is lost quite a bit but still has a BMI over 38.  At some point I helped we will consider having his knees replaced.  He does have a number of medical comorbidities ? ?Follow-Up Instructions: Return if symptoms worsen or fail to improve.  ? ?Orders:  ?No orders of the defined types were placed in this encounter. ? ?No orders of the defined types were placed in this encounter. ? ? ? ? Procedures: ?Large Joint Inj: bilateral knee on 08/26/2021 2:56 PM ?Indications: diagnostic evaluation ?Details: 25 G 1.5 in needle, anteromedial approach ? ?Arthrogram: No ? ?Medications (Right): 2 mL lidocaine 1 %; 2 mL bupivacaine 0.25 % ?Medications (Left): 2 mL lidocaine 1 %; 2 mL bupivacaine 0.25 % ? ?12 mg of betamethasone injected into both knees in the medial compartment with Marcaine and Xylocaine without problem ?Consent was given by the patient. Immediately prior to procedure a time out was called to verify the correct patient, procedure, equipment, support staff and site/side marked as required. Patient was prepped and draped in the usual sterile fashion.  ? ? ? ? ?Clinical Data: ?No additional findings. ? ? ?Subjective: ?Chief Complaint  ?Patient presents with  ? Right Knee - Pain  ? Left Knee  - Pain  ?Patient presents today for recurrent chronic bilateral knee pain. Its been three months since his last injections. He is wanting to get them injected again today.  Has been working on his weight and notes that he is lost some more ? ?HPI ? ?Review of Systems ? ? ?Objective: ?Vital Signs: There were no vitals taken for this visit. ? ?Physical Exam ?Constitutional:   ?   Appearance: He is well-developed.  ?Pulmonary:  ?   Effort: Pulmonary effort is normal.  ?Skin: ?   General: Skin is warm and dry.  ?Neurological:  ?   Mental Status: He is alert and oriented to person, place, and time.  ?Psychiatric:     ?   Behavior: Behavior normal.  ? ? ?Ortho Exam no change in knee exams from prior evaluations he does lack a few degrees to full extension bilaterally might have a small effusion in both of his knees and mostly medial joint pain with hypertrophic changes and increased varus.  Flexed over 100 degrees without instability.  ? ?Specialty Comments:  ?No specialty comments available. ? ?Imaging: ?No results found. ? ? ?PMFS History: ?Patient Active Problem List  ? Diagnosis Date Noted  ? History of gout 03/29/2021  ? Cough 10/09/2020  ? Morbid (severe) obesity due to excess calories (  [place]) 09/29/2020  ? Trigger finger, left ring finger 05/07/2020  ? Chronic pain of left hand 04/15/2020  ? Routine general medical examination at a health care facility 02/25/2020  ? Rectal pain 10/18/2019  ? Rash 10/08/2019  ? Fall 02/05/2019  ? Neuropathy 11/16/2017  ? Prediabetes 11/16/2017  ? Vitamin D deficiency 11/16/2017  ? 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 Bleeding nose 05/18/2017  ? Allergic rhinitis 01/26/2017  ? Blue nevus 05/16/2016  ? Hair loss 05/16/2016  ? Abdominal pain 05/16/2016  ? Umbilical hernia without obstruction and without gangrene 05/16/2016  ? Atrial fibrillation ([place]) 09/26/2014  ? Chronic diastolic congestive heart failure ([place]) 09/26/2014  ? Anxiety 09/26/2014  ? Obstructive sleep apnea on  CPAP 09/26/2014  ? Essential hypertension 09/26/2014  ? Hyperlipidemia 09/26/2014  ? Osteoarthritis of both knees 09/26/2014  ? Kidney stone 09/26/2014  ? ?Past Medical History:  ?Diagnosis Date  ? Chronic a-fib ([place])   ? Chronic systolic CHF (congestive heart failure) ([place])   ? Depression   ? Hemochromatosis   ? History of kidney stones   ? History of pulmonary embolism   ? Hyperlipidemia   ? Hypertension   ? Migraine   ? Obesity   ? OSA (obstructive sleep apnea)   ? on CPAP  ? Osteoarthritis   ? bilateral knees  ? [place] spotted fever 2011  ?  ?Family History  ?Problem Relation Age of Onset  ? Thyroid disease Mother   ? Cancer Father   ?     lung  ? Arthritis Father   ? Hypertension Father   ? Cancer Paternal Grandfather   ?     Throat cancer  ?  ?Past Surgical History:  ?Procedure Laterality Date  ? CARDIAC CATHETERIZATION  2012  ? [facility]  ? KNEE SURGERY    ? right knee   ? LITHOTRIPSY  07/31/2014  ? URETERAL STENT PLACEMENT  2016  ? URETEROSCOPY    ? ?Social History  ? ?Occupational History  ? Not on file  ?Tobacco Use  ? Smoking status: Never  ? Smokeless tobacco: Never  ?Vaping Use  ? Vaping Use: Never used  ?Substance and Sexual Activity  ? Alcohol use: No  ?  Comment: occasional; 3 times a year  ? Drug use: No  ? Sexual activity: Not Currently  ? ? ? ? ? ? ?

## 2021-08-26 NOTE — Telephone Encounter (Signed)
I received a email from [place] [facility] about the patient having a wheelchair evaluation with Sande Brothers at [facility] rehab, I emailed the form that was attached to Carlos Crosby and I called and informed his nurse to be on the lookout for the form to be filled out at his evaluation and she understood and stated she would let him know to check his email.  [name],cma  ?

## 2021-08-27 ENCOUNTER — Other Ambulatory Visit: Payer: Self-pay | Admitting: *Deleted

## 2021-08-27 ENCOUNTER — Encounter: Payer: Self-pay | Admitting: Cardiovascular Disease

## 2021-08-27 DIAGNOSIS — G4730 Sleep apnea, unspecified: Secondary | ICD-10-CM | POA: Diagnosis not present

## 2021-08-27 DIAGNOSIS — G4733 Obstructive sleep apnea (adult) (pediatric): Secondary | ICD-10-CM | POA: Diagnosis not present

## 2021-08-27 MED ORDER — VERAPAMIL HCL ER 120 MG PO TBCR: 120.0000 mg | EXTENDED_RELEASE_TABLET | Freq: Every day | ORAL | 2 refills | Status: DC

## 2021-09-03 ENCOUNTER — Other Ambulatory Visit: Payer: Self-pay | Admitting: Cardiovascular Disease

## 2021-09-03 MED ORDER — PRAVASTATIN SODIUM 80 MG PO TABS
80.0000 mg | ORAL_TABLET | Freq: Every evening | ORAL | 2 refills | Status: DC
Start: 2021-09-03 — End: 2021-09-20

## 2021-09-09 ENCOUNTER — Ambulatory Visit: Payer: Medicare (Managed Care) | Attending: Family Medicine

## 2021-09-09 DIAGNOSIS — R262 Difficulty in walking, not elsewhere classified: Secondary | ICD-10-CM | POA: Insufficient documentation

## 2021-09-09 DIAGNOSIS — M25562 Pain in left knee: Secondary | ICD-10-CM | POA: Insufficient documentation

## 2021-09-09 DIAGNOSIS — G8929 Other chronic pain: Secondary | ICD-10-CM | POA: Insufficient documentation

## 2021-09-09 DIAGNOSIS — R269 Unspecified abnormalities of gait and mobility: Secondary | ICD-10-CM | POA: Diagnosis not present

## 2021-09-09 DIAGNOSIS — M25561 Pain in right knee: Secondary | ICD-10-CM | POA: Diagnosis not present

## 2021-09-09 DIAGNOSIS — R2689 Other abnormalities of gait and mobility: Secondary | ICD-10-CM | POA: Diagnosis not present

## 2021-09-09 DIAGNOSIS — M6281 Muscle weakness (generalized): Secondary | ICD-10-CM | POA: Insufficient documentation

## 2021-09-09 NOTE — Therapy (Signed)
[place] [facility] ?[place], [place], [address] ?Phone: [phone]   Fax:  [phone] ? ?Physical Therapy Evaluation/Wheelchair Evaluation ? ?Patient Details  ?Name: Carlos Crosby ?MRN: [id number] ?Date of Birth: 1955/12/26 ?Referring Provider (PT): Caryl Bis ? ? ?Encounter Date: 09/09/2021 ? ? PT End of Session - 09/09/21 1424   ? ? Visit Number 1   ? Number of Visits 1   ? Date for PT Re-Evaluation 09/09/21   ? PT Start Time 1300   ? PT Stop Time 1355   ? PT Time Calculation (min) 55 min   ? Activity Tolerance Patient tolerated treatment well   ? Behavior During Therapy [facility] for tasks assessed/performed   ? ?  ?  ? ?  ? ? ?Past Medical History:  ?Diagnosis Date  ? Chronic a-fib ([place])   ? Chronic systolic CHF (congestive heart failure) ([place])   ? Depression   ? Hemochromatosis   ? History of kidney stones   ? History of pulmonary embolism   ? Hyperlipidemia   ? Hypertension   ? Migraine   ? Obesity   ? OSA (obstructive sleep apnea)   ? on CPAP  ? Osteoarthritis   ? bilateral knees  ? Rocky [place] spotted fever 2011  ? ? ?Past Surgical History:  ?Procedure Laterality Date  ? CARDIAC CATHETERIZATION  2012  ? [facility]  ? KNEE SURGERY    ? right knee   ? LITHOTRIPSY  07/31/2014  ? URETERAL STENT PLACEMENT  2016  ? URETEROSCOPY    ? ? ?There were no vitals filed for this visit. ? ? ? Subjective Assessment - 09/09/21 1408   ? ? Subjective Patient reports he is here for a power wheelchair evaluation as he is need for a new chair to replace his current power wheelchair that is 65 years old with issues with wheel and battery- Current model has been discontinued.   ? Pertinent History Patient is a 66 year old male present today for power wheelchair evaluation with diagnosis of impaired gait and mobility. He reports currently only able to ambulate very minimal distances at home due to severe Osteoarthritis and up to 10/10 bilateral knee pain with standing/walking. He  has past medical history including OA- Bilateral Knees, A-fib,Hemochromatosis, hereditary, Chronic systolic CHF, Rocky mountain spotted fever. Patient reports depedent on his power mobility device in home to assist with ADL's, some cooking from chair, and in community.   ? Limitations Lifting;Standing;Walking;Writing   ? How long can you sit comfortably? No issues   ? How long can you stand comfortably? < 5 min   ? How long can you walk comfortably? Barely able to walk per patient report using single point cane   ? Patient Stated Goals To obtain a new power wheelchair   ? Currently in Pain? Yes   ? Pain Score 3    3/10 current; up to 10/10 at worst - bilateral knee pain  ? Pain Location Knee   ? Pain Orientation Right;Left;Anterior   ? Pain Descriptors / Indicators Aching;Sore;Tightness   ? Pain Type Chronic pain   ? Pain Onset More than a month ago   ? Pain Frequency Constant   ? Aggravating Factors  prolonged standing/walking   ? Pain Relieving Factors Rest   ? Effect of Pain on Daily Activities Difficulty with transfers/walking/standing performing ADL's   ? ?  ?  ? ?  ? ?PATIENT INFORMATION: ?This Evaluation form will serve as  the LMN for the following suppliers: ? ?Supplier: [employer] ?Contact Person: [place] [employer] ?Phone: [phone] ? ? ?Reason for Referral: Power wheelchair evaluation ?Patient/caregiver Goals:  To obtain a new wheelchair to continue to be independent with mobility in the home.  ?Patient was seen for face-to-face evaluation for new power wheelchair.  Also present was    Deberah Pelton  to discuss recommendations and wheelchair options.  Further paperwork was completed and sent to vendor.  Patient appears to qualify for power mobility device at this time per objective findings.   ?MEDICAL HISTORY:  Patient is a 66 year old male present today for power wheelchair evaluation with diagnosis of impaired gait and mobility. He reports currently only able to ambulate  very minimal distances at home due to severe Osteoarthritis and up to 10/10 bilateral knee pain with standing/walking. He has past medical history including OA- Bilateral Knees, A-fib,Hemochromatosis, hereditary, Chronic systolic CHF, Rocky mountain spotted fever. Patient reports depedent on his power mobility device in home to assist with ADL's, some cooking from chair, and in community ? ?Diagnosis: Impaired  gait and mobility ?Primary Diagnosis Onset: ?'[]'$ Progressive Disease ?Relevant Past and Future Surgeries: ?Height: '5\' 11"'$  ?Weight: 315 lb ?Explain and recent changes or trends in weight: Stable with no changes ? ?Relevant History including falls:  Patient reports several episodes of losing balance and reaching for furniture/walls. He reports using mostly power wheelchair in his home due to his severe arthritis in his knees  with pain with standing and all walking.  ? ? ? ?  ?HOME ENVIRONMENT: ?'[x]'$ House  '[]'$ Condo/town home  '[]'$ Apartment  '[]'$ Assisted Living    ?'[]'$ Lives Alone '[x]'$  Lives with Others                                                    ?Hours with caregiver:   ?'[x]'$ Home is accessible to patient            ?Stairs  '[x]'$ Yes '[]'$  No     ?Ramp '[]'$ Yes '[x]'$ No ?Comments:  Patient reports needs a ramp but does not have resources.   ? ?COMMUNITY ADL: ?TRANSPORTATION: ?'[x]'$ Car    ?'[]'$ Lucianne Lei    ?'[]'$ Public Transportation    ?'[]'$ Adapted w/c Lift   ?'[]'$ Ambulance   ?'[]'$ Other:       ?'[]'$ Sits in wheelchair during transport  ?Employment/School:     ?Specific requirements pertaining to mobility                                                     ?Other:      On SS diability - on Medicare since 2012- secondary to painful knees-OA                            ?  ? ? ? ?FUNCTIONAL/SENSORY PROCESSING SKILLS:  ?Handedness:   '[x]'$ Right     '[]'$ Left    '[]'$ NA  Comments:                                 ?Functional Processing Skills for Wheeled Mobility ?'[x]'$ Processing Skills are adequate for safe  wheelchair operation  ?Areas of concern than may interfere  with safe operation of wheelchair Description of problem   ?'[]'$  Attention to environment     ?'[]'$ Judgment     ?'[]'$  Hearing  ?'[]'$  Vision or visual processing    ?'[]'$ Motor Planning  ?'[]'$  Fluctuations in Behavior                                                ?  ? ?VERBAL COMMUNICATION: ?'[x]'$ WFL receptive ?'[x]'$  WFL expressive ?'[x]'$ Understandable  ?'[]'$ Difficult to understand  ?'[]'$ non-communicative ?'[]'$  Uses an augmented communication device  ? ? ?CURRENT SEATING / MOBILITY: ?Current Mobility Base:  ? '[]'$ None ? '[]'$ Dependent ? '[]'$ Manual ? '[]'$ Scooter ? '[x]'$ Power   ?Type of Control:                       ?Manufacturer:     Dahlia Bailiff   [id number]                Size:                         Age:     patient reports w/c is 67 years old                      ?Current Condition of Mobility Base:        missing a wheel; need a battery and parts are now discontinued.                                                                                                              ?Current Wheelchair components:                                                                                                                                   ?Describe posture in present seating system:                                                                           ? ?SENSATION and  SKIN ISSUES: ?Sensation ?'[x]'$ Intact ?'[]'$ Impaired ?'[]'$ Absent  ? ?Level of sensation:    ? ?                      Pressure Relief: ?Able to perform effective pressure relief :   ?'[x]'$ Yes  '[]'$  No ?Method:                                                                              ?If not, Why?:                                                                          ?Skin Issues/Skin Integrity ?Current Skin Issues  ? '[]'$ Yes '[x]'$ No ? ?'[x]'$ Intact ?'[]'$  Red area ?'[]'$  Open Area  ?'[]'$ Scar Tissue ?'[]'$ At risk from prolonged sitting ? ?Where                              ?History of Skin Issues  ? '[]'$ Yes '[x]'$ No ? ?Where                                         ?When                                               ?Hx  of skin flap surgeries ?'[]'$ Yes '[x]'$ No ? ?Where                  When                                                  ?Limited sitting tolerance '[]'$ Yes '[x]'$ No Hours spent sitting in wheelchair daily:

## 2021-09-17 ENCOUNTER — Encounter: Payer: Self-pay | Admitting: Cardiovascular Disease

## 2021-09-20 ENCOUNTER — Other Ambulatory Visit: Payer: Self-pay | Admitting: Cardiovascular Disease

## 2021-09-20 ENCOUNTER — Other Ambulatory Visit: Payer: Self-pay

## 2021-09-20 MED ORDER — PRAVASTATIN SODIUM 80 MG PO TABS: 80.0000 mg | ORAL_TABLET | Freq: Every evening | ORAL | 0 refills | Status: DC

## 2021-09-27 ENCOUNTER — Other Ambulatory Visit: Payer: Self-pay | Admitting: Family Medicine

## 2021-09-27 DIAGNOSIS — M170 Bilateral primary osteoarthritis of knee: Secondary | ICD-10-CM

## 2021-09-28 ENCOUNTER — Ambulatory Visit (INDEPENDENT_AMBULATORY_CARE_PROVIDER_SITE_OTHER): Payer: Medicare (Managed Care) | Admitting: Family Medicine

## 2021-09-28 ENCOUNTER — Encounter: Payer: Self-pay | Admitting: Family Medicine

## 2021-09-28 DIAGNOSIS — R220 Localized swelling, mass and lump, head: Secondary | ICD-10-CM | POA: Diagnosis not present

## 2021-09-28 DIAGNOSIS — R221 Localized swelling, mass and lump, neck: Secondary | ICD-10-CM

## 2021-09-28 LAB — CBC WITH DIFFERENTIAL/PLATELET
Basophils Absolute: 0.1 10*3/uL (ref 0.0–0.1)
Basophils Relative: 1 % (ref 0.0–3.0)
Eosinophils Absolute: 0.1 10*3/uL (ref 0.0–0.7)
Eosinophils Relative: 2.3 % (ref 0.0–5.0)
HCT: 49.6 % (ref 39.0–52.0)
Hemoglobin: 17.2 g/dL — ABNORMAL HIGH (ref 13.0–17.0)
Lymphocytes Relative: 34.1 % (ref 12.0–46.0)
Lymphs Abs: 2.2 10*3/uL (ref 0.7–4.0)
MCHC: 34.7 g/dL (ref 30.0–36.0)
MCV: 100.6 fl — ABNORMAL HIGH (ref 78.0–100.0)
Monocytes Absolute: 0.5 10*3/uL (ref 0.1–1.0)
Monocytes Relative: 7.6 % (ref 3.0–12.0)
Neutro Abs: 3.5 10*3/uL (ref 1.4–7.7)
Neutrophils Relative %: 55 % (ref 43.0–77.0)
Platelets: 206 10*3/uL (ref 150.0–400.0)
RBC: 4.93 Mil/uL (ref 4.22–5.81)
RDW: 13.5 % (ref 11.5–15.5)
WBC: 6.4 10*3/uL (ref 4.0–10.5)

## 2021-09-28 NOTE — Patient Instructions (Signed)
Nice to see you. ?Somebody should contact you to schedule the ultrasound. ?We will contact you with your CBC results. ?If the lesion rapidly increases in size, becomes painful, has overlying redness, or you develop fevers you should seek medical attention immediately. ?

## 2021-09-28 NOTE — Progress Notes (Signed)
?[name], MD ?Phone: [phone] ? ?Carlos Crosby is a 66 y.o. male who presents today for same-day visit. ? ?Left neck lesion: Patient reports noting a left neck enlargement earlier today when he was shaving.  He notes there is no pain.  He has had no sore throat, cough, congestion, fevers.  He does have allergies and notes his ears been bothering him a little bit. ? ?Social History  ? ?Tobacco Use  ?Smoking Status Never  ?Smokeless Tobacco Never  ? ? ?Current Outpatient Medications on File Prior to Visit  ?Medication Sig Dispense Refill  ? acetaminophen (TYLENOL) 500 MG tablet Take 1,000 mg by mouth every 6 (six) hours as needed.    ? Cholecalciferol (VITAMIN D) 2000 units CAPS daily.     ? digoxin (LANOXIN) 0.25 MG tablet Take 1 tablet (0.25 mg total) by mouth daily. 90 tablet 0  ? ELIQUIS 5 MG TABS tablet TAKE 1 TABLET TWICE DAILY 180 tablet 1  ? loratadine (CLARITIN) 10 MG tablet Take 10 mg by mouth daily.    ? metoprolol succinate (TOPROL-XL) 100 MG 24 hr tablet TAKE 2 TABLETS EVERY MORNING  AND TAKE 1 TABLET EVERY EVENING 270 tablet 3  ? Multiple Vitamins-Minerals (CENTRUM SILVER ADULT 50+) TABS Take 1 tablet by mouth daily.    ? pravastatin (PRAVACHOL) 80 MG tablet Take 1 tablet (80 mg total) by mouth every evening. PLEASE CALL TO SCHEDULE OFFICE VISIT FOR FURTHER REFILLS. THANK YOU! 90 tablet 0  ? sacubitril-valsartan (ENTRESTO) 24-26 MG Take 1 tablet by mouth 2 (two) times daily. 180 tablet 3  ? traMADol (ULTRAM) 50 MG tablet TAKE 1 TABLET(50 MG) BY MOUTH EVERY 6 HOURS AS NEEDED FOR PAIN 90 tablet 0  ? verapamil (CALAN-SR) 120 MG CR tablet Take 1 tablet (120 mg total) by mouth daily. 90 tablet 2  ? chlorhexidine (PERIDEX) 0.12 % solution SMARTSIG:By Mouth    ? ?No current facility-administered medications on file prior to visit.  ? ? ? ?ROS see history of present illness ? ?Objective ? ?Physical Exam ?Vitals:  ? 09/28/21 1304  ?BP: 120/80  ?Pulse: (!) 104  ?Temp: 98.5 ?F (36.9 ?C)  ?SpO2: 98%   ? ? ?BP Readings from Last 3 Encounters:  ?09/28/21 120/80  ?08/12/21 100/76  ?08/12/21 118/79  ? ?Wt Readings from Last 3 Encounters:  ?09/28/21 (!) 322 lb 6.4 oz (146.2 kg)  ?08/12/21 (!) 310 lb 14.4 oz (141 kg)  ?08/09/21 (!) 314 lb (142.4 kg)  ? ? ?Physical Exam ?Neck:  ? ? ? ? ?Assessment/Plan: Please see individual problem list. ? ?Problem List Items Addressed This Visit   ? ? Submandibular swelling  ?  Discussed that this could be an enlarged salivary gland versus an enlarged lymph node.  His exam is not indicative of an infection.  He has no other symptoms to point towards a reactive process.  Discussed that this could be a malignant process versus a benign process.  We will get an ultrasound and CBC with differential to evaluate further.  Discussed potential need for a CT scan if the after mentioned work-up is not revealing of a cause.  He was advised to seek medical attention for rapidly enlarging lesion, pain, overlying skin changes, or fevers. Discussed he could try sour hard candies to see if that would be helpful if it is related to a salivary stone. He is also using chlorhexadine from his dentist and this carries a risk of sialadenitis.  I encouraged to try stopping the  chlorhexidine to see if that would be beneficial. ? ?  ?  ? Relevant Orders  ? CBC w/Diff  ? US Soft Tissue Head/Neck (NON-THYROID)  ? ? ?Return in about 1 month (around 10/28/2021) for Recheck neck lesion. ? ?This visit occurred during the SARS-CoV-2 public health emergency.  Safety protocols were in place, including screening questions prior to the visit, additional usage of staff PPE, and extensive cleaning of exam room while observing appropriate contact time as indicated for disinfecting solutions.  ? ? ?[name], MD ?[place] ? ?

## 2021-09-28 NOTE — Assessment & Plan Note (Addendum)
Discussed that this could be an enlarged salivary gland versus an enlarged lymph node.  His exam is not indicative of an infection.  He has no other symptoms to point towards a reactive process.  Discussed that this could be a malignant process versus a benign process.  We will get an ultrasound and CBC with differential to evaluate further.  Discussed potential need for a CT scan if the after mentioned work-up is not revealing of a cause.  He was advised to seek medical attention for rapidly enlarging lesion, pain, overlying skin changes, or fevers. Discussed he could try sour hard candies to see if that would be helpful if it is related to a salivary stone. He is also using chlorhexadine from his dentist and this carries a risk of sialadenitis.  I encouraged to try stopping the chlorhexidine to see if that would be beneficial. ?

## 2021-10-04 ENCOUNTER — Ambulatory Visit
Admission: RE | Admit: 2021-10-04 | Discharge: 2021-10-04 | Disposition: A | Discharge status: Home / Self Care | Payer: Medicare (Managed Care) | Source: Ambulatory Visit | Attending: Family Medicine | Admitting: Family Medicine

## 2021-10-04 ENCOUNTER — Encounter: Payer: Self-pay | Admitting: Family Medicine

## 2021-10-04 DIAGNOSIS — R220 Localized swelling, mass and lump, head: Secondary | ICD-10-CM | POA: Diagnosis not present

## 2021-10-04 DIAGNOSIS — R221 Localized swelling, mass and lump, neck: Secondary | ICD-10-CM | POA: Diagnosis not present

## 2021-10-05 ENCOUNTER — Encounter: Payer: Self-pay | Admitting: Cardiovascular Disease

## 2021-10-05 ENCOUNTER — Ambulatory Visit (INDEPENDENT_AMBULATORY_CARE_PROVIDER_SITE_OTHER): Payer: Medicare (Managed Care) | Admitting: Cardiovascular Disease

## 2021-10-05 ENCOUNTER — Telehealth: Payer: Self-pay | Admitting: *Deleted

## 2021-10-05 VITALS — BP 120/82 | HR 76 | Ht 71.0 in | Wt 359.2 lb

## 2021-10-05 DIAGNOSIS — I10 Essential (primary) hypertension: Secondary | ICD-10-CM

## 2021-10-05 DIAGNOSIS — I5032 Chronic diastolic (congestive) heart failure: Secondary | ICD-10-CM | POA: Diagnosis not present

## 2021-10-05 DIAGNOSIS — I4821 Permanent atrial fibrillation: Secondary | ICD-10-CM | POA: Diagnosis not present

## 2021-10-05 MED ORDER — FUROSEMIDE 20 MG PO TABS: 20.0000 mg | ORAL_TABLET | Freq: Every day | ORAL | 3 refills | Status: DC | PRN

## 2021-10-05 NOTE — Progress Notes (Signed)
Cardiology Office Note ? ?Date:  10/05/2021  ? ?ID:  Carlos Crosby, DOB [birth year], MRN [id number] ? ?PCP:  Carlos Haven, MD  ? ?Chief Complaint  ?Patient presents with  ? Edema  ?  Patient c/o shortness of breath and LE edema. Medications reviewed by the patient verbally.   ? ? ?HPI:  ?[name] is a 66 year-old gentleman with  ?morbid obesity,  ?chronic atrial fibrillation (previously on tikosyn),  ?chronic systolic CHF with ejection fraction 25% in 2012,  ?Improved up to 45% - 50% ?cardiac cath: no CAD,   ?obstructive sleep apnea on CPAP, ? hypertension,  ?Hyperlipidemia, ? recurrent renal stones,  ?osteoarthritis of the knees bilaterally, ?Previously noted to have elevated ventricular rate in the setting of anxiety, such as before procedures ? 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 ?who presents for routine follow-up of his atrial fibrillation ? ?Last seen in clinic by myself September 2021 ? ?Did not go through with gastric bypass surgery, on prior clinic visit was losing weight by watching  goal weight was 220 ? ?Since his last clinic visit weight has been trending back up ?Recently drove to [place] the area and back in several days  ?Legs swollen now ?Does not have lasix ?Reports drinking 100 ounces of water per day ?Denies significant shortness of breath or abdominal distention ? ?Weight higher 359 up from 322 in April, 310 pounds in March ?Off the diet ? ?Still swimming ?Started on entresto: Visit to our office 11/22 ?Cr up to 1.37, recovered on follow-up lab work 07/20/20 ? ?Other Labs reviewed ?HGB 17.2 ? ?EKG personally reviewed by myself on todays visit ?Atrial fibrillation rate 76 bpm  old inferior MI ?Poor rave progression ? ?Past medical history reviewed ?periodic phlebotomy for hemochromatosis ?Also with some polycythemia secondary to elevated iron stores ? ?Seen by EP Dr. Caryl Comes April 2021 ?Permanent atrial  fibrillation, rate in the 80s ?Walking with a dog 2 to 3 days a week ?Dig level check ? ?ECHO 11/24/2015: EF 45 to 50% ?Stress test : 12/21/2015 No ischemia, EF 42% ? echo in 05/2015: EF 30 to 35% ? ?outpatient left lithotripsy 08/28/2014  found to have atrial fibrillation with RVR with heart rates up to 170s prior to the procedure and this was canceled. ?He was kept overnight in the hospital and heart rate returned to normal on his regular medication regiment, metoprolol succinate 100 mg twice a day ? ? ?PMH:   has a past medical history of Chronic a-fib ([place]), Chronic systolic CHF (congestive heart failure) ([place]), Depression, Hemochromatosis, History of kidney stones, History of pulmonary embolism, Hyperlipidemia, Hypertension, Migraine, Obesity, OSA (obstructive sleep apnea), Osteoarthritis, and Rocky Mountain spotted fever (2011). ? ?PSH:    ?Past Surgical History:  ?Procedure Laterality Date  ? CARDIAC CATHETERIZATION  2012  ? [facility]  ? KNEE SURGERY    ? right knee   ? LITHOTRIPSY  07/31/2014  ? URETERAL STENT PLACEMENT  2016  ? URETEROSCOPY    ? ? ?Current Outpatient Medications  ?Medication Sig Dispense Refill  ? acetaminophen (TYLENOL) 500 MG tablet Take 1,000 mg by mouth every 6 (six) hours as needed.    ? Cholecalciferol (VITAMIN D) 2000 units CAPS daily.     ? digoxin (LANOXIN) 0.25 MG tablet Take 1 tablet (0.25 mg total) by mouth daily. 90 tablet 0  ? ELIQUIS 5 MG TABS tablet TAKE 1 TABLET TWICE DAILY 180 tablet 1  ? loratadine (  CLARITIN) 10 MG tablet Take 10 mg by mouth daily.    ? metoprolol succinate (TOPROL-XL) 100 MG 24 hr tablet TAKE 2 TABLETS EVERY MORNING  AND TAKE 1 TABLET EVERY EVENING 270 tablet 3  ? Multiple Vitamins-Minerals (CENTRUM SILVER ADULT 50+) TABS Take 1 tablet by mouth daily.    ? pravastatin (PRAVACHOL) 80 MG tablet Take 1 tablet (80 mg total) by mouth every evening. PLEASE CALL TO SCHEDULE OFFICE VISIT FOR FURTHER REFILLS. THANK YOU! 90 tablet 0  ? sacubitril-valsartan (ENTRESTO)  24-26 MG Take 1 tablet by mouth 2 (two) times daily. 180 tablet 3  ? traMADol (ULTRAM) 50 MG tablet TAKE 1 TABLET(50 MG) BY MOUTH EVERY 6 HOURS AS NEEDED FOR PAIN 90 tablet 0  ? verapamil (CALAN-SR) 120 MG CR tablet Take 1 tablet (120 mg total) by mouth daily. 90 tablet 2  ? chlorhexidine (PERIDEX) 0.12 % solution SMARTSIG:By Mouth (Patient not taking: Reported on 10/05/2021)    ? ?No current facility-administered medications for this visit.  ? ? ? ?Allergies:   Allopurinol, Diltiazem, Chlorhexidine, Ondansetron, and Penicillins  ? ?Social History:  The patient  reports that he has never smoked. He has never used smokeless tobacco. He reports that he does not drink alcohol and does not use drugs.  ? ?Family History:   family history includes Arthritis in his father; Cancer in his father and paternal grandfather; Hypertension in his father; Thyroid disease in his mother.  ? ? ?Review of Systems: ?Review of Systems  ?Constitutional: Negative.   ?HENT: Negative.    ?Respiratory: Negative.    ?Cardiovascular: Negative.   ?Gastrointestinal: Negative.   ?Musculoskeletal:  Positive for joint pain.  ?Neurological: Negative.   ?Psychiatric/Behavioral: Negative.    ?All other systems reviewed and are negative. ? ?PHYSICAL EXAM: ?VS:  BP 120/82 (BP Location: Left Arm, Patient Position: Sitting, Cuff Size: Large)   Pulse 76   Ht '5\' 11"'$  (1.803 m)   Wt (!) 359 lb 4 oz (163 kg)   SpO2 98%   BMI 50.11 kg/m?  , BMI Body mass index is 50.11 kg/m?[place] Kitchen ?Constitutional:  oriented to person, place, and time. No distress.  Obese ?HENT:  ?Head: Grossly normal ?Eyes:  no discharge. No scleral icterus.  ?Neck: No JVD, no carotid bruits  ?Cardiovascular: Irregularly irregular no murmurs appreciated ?Trace pitting lower extremity edema ?Pulmonary/Chest: Clear to auscultation bilaterally, no wheezes or rails ?Abdominal: Soft.  no distension.  no tenderness.  ?Musculoskeletal: Normal range of motion ?Neurological:  normal muscle tone.  Coordination normal. No atrophy ?Skin: Skin warm and dry ?Psychiatric: normal affect, pleasant ? ?Recent Labs: ?07/12/2021: ALT 15 ?07/20/2021: BUN 17; Creatinine, Ser 1.01; Potassium 4.7; Sodium 137 ?09/28/2021: Hemoglobin 17.2; Platelets 206.0  ? ? ?Lipid Panel ?Lab Results  ?Component Value Date  ? CHOL 155 03/29/2021  ? HDL 41.10 03/29/2021  ? [place] 77 02/25/2020  ? TRIG 201.0 (H) 03/29/2021  ? ?  ? ?Wt Readings from Last 3 Encounters:  ?10/05/21 (!) 359 lb 4 oz (163 kg)  ?09/28/21 (!) 322 lb 6.4 oz (146.2 kg)  ?08/12/21 (!) 310 lb 14.4 oz (141 kg)  ?  ? ? ? ?ASSESSMENT AND PLAN: ? ? ?Permanent atrial fibrillation ([place]) ?Rate controlled, on eliquis ?No changes made ? ?Essential hypertension ?Blood pressure is well controlled on today's visit. No changes made to the medications. ? ?Diastolic CHF ?Very high water intake 100 ounces per daily, recommend he decrease his fluid intake ?Recent trip to [place] likely with component of dependent  edema, sat in the car for many hours both ways.  In addition weight up tremendously ?Recommend he take Lasix 20 mg as needed for leg swelling, cut back on his water intake ? ?Pure hypercholesterolemia ?Cholesterol is at goal on the current lipid regimen. No changes to the medications were made. ? ?Morbid obesity ([place]) ?Weight has trended higher in the past 6 months, off his diet ?Recommend he restart his exercise and strict diet program ?Previously declined gastric sleeve ? ?Anxiety ?Needs Xanax before procedures ? ?Obstructive sleep apnea on CPAP ? compliant on his CPAP ? ?Chronic diastolic congestive heart failure ([place]) ?We have restarted Lasix as needed ?EF 50 to 55% ?On Entresto ?Blood pressure stable ? ? ? Total encounter time more than 30 minutes ? Greater than 50% was spent in counseling and coordination of care with the patient ? ? ? ? ?Signed, ?Esmond Plants, M.D., Ph.D. ?10/05/2021  ?[place] ?[phone] ? ?

## 2021-10-05 NOTE — Telephone Encounter (Signed)
Spoke with patient and he reports increased swelling since recent road trip to [place]. We discussed possible causes such as riding in the car for extended amounts of time and/or diet during that trip. He was last seen 05/05/22 and due for follow up as well. Scheduled him to come in today to see provider to review concerns and provide further recommendations. He was appreciative for the call with no further questions at this time.  ? ? ? ?[name]  P Cv Div [place] Triage (supporting Gollan, Kathlene November, MD) 1 hour ago (6:30 AM)  ? ?I'went on a trip to [place] for the weekend. when I got there pants  I just bought and tried on didnt  fit .and my feet and ankles were a little swollen.now my feet and ankles  are really swollen. I have no Lasix as I haven't had swelling in a few years and have no lasix. I have no idea why I'm swelling  ?

## 2021-10-05 NOTE — Patient Instructions (Addendum)
Watch fluid intake ? ?Get compression hose ? ?Medication Instructions:  ?Start Lasix/furosemide 20 mg daily as needed, once swelling goes away, stop taking  ? ? ?If you need a refill on your cardiac medications before your next appointment, please call your pharmacy.  ? ?Lab work: ?No new labs needed ? ?Testing/Procedures: ?No new testing needed ? ?Follow-Up: ?At [facility], you and your health needs are our priority.  As part of our continuing mission to provide you with exceptional heart care, we have created designated Provider Care Teams.  These Care Teams include your primary Cardiologist (physician) and Advanced Practice Providers (APPs -  Physician Assistants and Nurse Practitioners) who all work together to provide you with the care you need, when you need it. ? ?You will need a follow up appointment in 6 months, APP ok ? ?Providers on your designated Care Team:   ?Murray Hodgkins, NP ?Christell Faith, PA-C ?Cadence Kathlen Mody, PA-C ? ?COVID-19 Vaccine Information can be found at: [email or web address] For questions related to vaccine distribution or appointments, please email [email or web address] or call [phone].  ? ?

## 2021-10-12 ENCOUNTER — Encounter: Payer: Self-pay | Admitting: Family Medicine

## 2021-10-19 ENCOUNTER — Ambulatory Visit (INDEPENDENT_AMBULATORY_CARE_PROVIDER_SITE_OTHER): Payer: Medicare (Managed Care) | Admitting: Family Medicine

## 2021-10-19 ENCOUNTER — Encounter: Payer: Self-pay | Admitting: Family Medicine

## 2021-10-19 VITALS — BP 120/80 | HR 97 | Temp 97.7°F | Ht 71.0 in | Wt 323.4 lb

## 2021-10-19 DIAGNOSIS — R718 Other abnormality of red blood cells: Secondary | ICD-10-CM | POA: Insufficient documentation

## 2021-10-19 DIAGNOSIS — R221 Localized swelling, mass and lump, neck: Secondary | ICD-10-CM

## 2021-10-19 DIAGNOSIS — Z23 Encounter for immunization: Secondary | ICD-10-CM

## 2021-10-19 DIAGNOSIS — I5032 Chronic diastolic (congestive) heart failure: Secondary | ICD-10-CM

## 2021-10-19 DIAGNOSIS — I4821 Permanent atrial fibrillation: Secondary | ICD-10-CM

## 2021-10-19 DIAGNOSIS — R220 Localized swelling, mass and lump, head: Secondary | ICD-10-CM

## 2021-10-19 DIAGNOSIS — M170 Bilateral primary osteoarthritis of knee: Secondary | ICD-10-CM | POA: Diagnosis not present

## 2021-10-19 LAB — CBC WITH DIFFERENTIAL/PLATELET
Basophils Absolute: 0.1 10*3/uL (ref 0.0–0.1)
Basophils Relative: 1.2 % (ref 0.0–3.0)
Eosinophils Absolute: 0.2 10*3/uL (ref 0.0–0.7)
Eosinophils Relative: 3.5 % (ref 0.0–5.0)
HCT: 50.4 % (ref 39.0–52.0)
Hemoglobin: 17 g/dL (ref 13.0–17.0)
Lymphocytes Relative: 29 % (ref 12.0–46.0)
Lymphs Abs: 1.7 10*3/uL (ref 0.7–4.0)
MCHC: 33.6 g/dL (ref 30.0–36.0)
MCV: 101.9 fl — ABNORMAL HIGH (ref 78.0–100.0)
Monocytes Absolute: 0.5 10*3/uL (ref 0.1–1.0)
Monocytes Relative: 8.1 % (ref 3.0–12.0)
Neutro Abs: 3.5 10*3/uL (ref 1.4–7.7)
Neutrophils Relative %: 58.2 % (ref 43.0–77.0)
Platelets: 193 10*3/uL (ref 150.0–400.0)
RBC: 4.94 Mil/uL (ref 4.22–5.81)
RDW: 13.6 % (ref 11.5–15.5)
WBC: 6 10*3/uL (ref 4.0–10.5)

## 2021-10-19 LAB — FOLATE: Folate: 22.2 ng/mL (ref >5.9)

## 2021-10-19 LAB — VITAMIN B12: Vitamin B-12: 747 pg/mL (ref 211–911)

## 2021-10-19 NOTE — Progress Notes (Signed)
?[name], MD ?Phone: [phone] ? ?Carlos Crosby is a 66 y.o. male who presents today for follow-up. ? ?Neck swelling: Patient reports this has completely resolved.  He stopped using a mouth rinse and notes that helped significantly.  His ultrasound revealed 3 adjacent mildly prominent though nonpathologically enlarged and benign appearing submandibular chain lymph nodes that were felt to be reactive. ? ?Chronic pain: Patient notes the tramadol works well.  He does get drowsy with it only if he takes his metoprolol first.  He has not heard anything about the wheelchair. ? ?Atrial fibrillation: No palpitations or bleeding issues.  He takes digoxin, verapamil, metoprolol, and Eliquis. ? ?Leg swelling: Patient reports bilateral leg swelling after going on a trip to [place].  He saw the cardiologist for dependent edema.  They advised to take Lasix 20 mg daily as needed for swelling and cut back on his water intake.  He notes the swelling completely resolved with taking the Lasix for a few days.  He notes no orthopnea or PND. ? ?Social History  ? ?Tobacco Use  ?Smoking Status Never  ?Smokeless Tobacco Never  ? ? ?Current Outpatient Medications on File Prior to Visit  ?Medication Sig Dispense Refill  ? acetaminophen (TYLENOL) 500 MG tablet Take 1,000 mg by mouth every 6 (six) hours as needed.    ? chlorhexidine (PERIDEX) 0.12 % solution     ? Cholecalciferol (VITAMIN D) 2000 units CAPS daily.     ? digoxin (LANOXIN) 0.25 MG tablet Take 1 tablet (0.25 mg total) by mouth daily. 90 tablet 0  ? ELIQUIS 5 MG TABS tablet TAKE 1 TABLET TWICE DAILY 180 tablet 1  ? furosemide (LASIX) 20 MG tablet Take 1 tablet (20 mg total) by mouth daily as needed for edema. 90 tablet 3  ? loratadine (CLARITIN) 10 MG tablet Take 10 mg by mouth daily.    ? metoprolol succinate (TOPROL-XL) 100 MG 24 hr tablet TAKE 2 TABLETS EVERY MORNING  AND TAKE 1 TABLET EVERY EVENING 270 tablet 3  ? Multiple Vitamins-Minerals (CENTRUM SILVER ADULT 50+)  TABS Take 1 tablet by mouth daily.    ? pravastatin (PRAVACHOL) 80 MG tablet Take 1 tablet (80 mg total) by mouth every evening. PLEASE CALL TO SCHEDULE OFFICE VISIT FOR FURTHER REFILLS. THANK YOU! 90 tablet 0  ? sacubitril-valsartan (ENTRESTO) 24-26 MG Take 1 tablet by mouth 2 (two) times daily. 180 tablet 3  ? traMADol (ULTRAM) 50 MG tablet TAKE 1 TABLET(50 MG) BY MOUTH EVERY 6 HOURS AS NEEDED FOR PAIN 90 tablet 0  ? verapamil (CALAN-SR) 120 MG CR tablet Take 1 tablet (120 mg total) by mouth daily. 90 tablet 2  ? ?No current facility-administered medications on file prior to visit.  ? ? ? ?ROS see history of present illness ? ?Objective ? ?Physical Exam ?Vitals:  ? 10/19/21 0901  ?BP: 120/80  ?Pulse: 97  ?Temp: 97.7 ?F (36.5 ?C)  ?SpO2: 99%  ? ? ?BP Readings from Last 3 Encounters:  ?10/19/21 120/80  ?10/05/21 120/82  ?09/28/21 120/80  ? ?Wt Readings from Last 3 Encounters:  ?10/19/21 (!) 323 lb 6.4 oz (146.7 kg)  ?10/05/21 (!) 359 lb 4 oz (163 kg)  ?09/28/21 (!) 322 lb 6.4 oz (146.2 kg)  ? ? ?Physical Exam ?Constitutional:   ?   General: He is not in acute distress. ?   Appearance: He is not diaphoretic.  ?Neck:  ?   Comments: No neck swelling noted ?Cardiovascular:  ?   Rate and  Rhythm: Normal rate. Rhythm irregularly irregular.  ?   Heart sounds: Normal heart sounds.  ?Pulmonary:  ?   Effort: Pulmonary effort is normal.  ?   Breath sounds: Normal breath sounds.  ?Musculoskeletal:  ?   Comments: 1+ pitting edema bilateral lower extremities to the mid shin  ?Lymphadenopathy:  ?   Head:  ?   Left side of head: No submandibular adenopathy.  ?   Cervical: No cervical adenopathy.  ?Skin: ?   General: Skin is warm and dry.  ?Neurological:  ?   Mental Status: He is alert.  ? ? ? ?Assessment/Plan: Please see individual problem list. ? ?Problem List Items Addressed This Visit   ? ? Atrial fibrillation ([place]) - Primary (Chronic)  ?  Rate controlled.  He will continue digoxin managed by cardiology, verapamil 120 mg daily,  metoprolol 200 mg daily, and Eliquis 5 mg twice daily. ? ?  ?  ? Chronic diastolic congestive heart failure (HCC) (Chronic)  ?  Continues to have some dependent edema.  Discussed this likely would be related to venous insufficiency.  Encouraged him to elevate his legs.  Discussed he could use the Lasix as needed for swelling as well. ? ?  ?  ? 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  He will continue to see hematology.  We are checking a CBC today. ? ?  ?  ? Osteoarthritis of both knees (Chronic)  ?  Chronic pain related to this issue.  He can continue tramadol 50 mg by mouth every 6 hours as needed for pain.  He will monitor for drowsiness. ? ?  ?  ? Elevated MCV  ?  Elevated on recent labs.  Follow-up lab work as outlined below. ? ?  ?  ? Relevant Orders  ? CBC w/Diff  ? B12  ? Folate  ? Submandibular swelling  ?  Resolved.  Presumably reactive to a mouthwash she was using. ? ?  ?  ? ?Other Visit Diagnoses   ? ? Need for pneumococcal 20-valent conjugate vaccination      ? Relevant Orders  ? Pneumococcal conjugate vaccine 20-valent (Prevnar 20) (Completed)  ? ?  ? ? ?Return in about 3 months (around 01/19/2022) for Pain follow-up. ? ? ?[name], MD ?[place] ? ?

## 2021-10-19 NOTE — Assessment & Plan Note (Signed)
He will continue to see hematology.  We are checking a CBC today. ?

## 2021-10-19 NOTE — Patient Instructions (Signed)
Nice to see you. ?We will check lab work today and contact you with the results. ?Please get the shingles vaccine at your pharmacy. ?Please monitor your leg swelling.  Please prop your legs up as much as you can.  If needed you can use the Lasix 20 mg daily as needed for the swelling. ?

## 2021-10-19 NOTE — Assessment & Plan Note (Signed)
Elevated on recent labs.  Follow-up lab work as outlined below. ?

## 2021-10-19 NOTE — Assessment & Plan Note (Signed)
Continues to have some dependent edema.  Discussed this likely would be related to venous insufficiency.  Encouraged him to elevate his legs.  Discussed he could use the Lasix as needed for swelling as well. ?

## 2021-10-19 NOTE — Assessment & Plan Note (Signed)
Chronic pain related to this issue.  He can continue tramadol 50 mg by mouth every 6 hours as needed for pain.  He will monitor for drowsiness. ?

## 2021-10-19 NOTE — Assessment & Plan Note (Signed)
Resolved.  Presumably reactive to a mouthwash she was using. ?

## 2021-10-19 NOTE — Assessment & Plan Note (Signed)
Rate controlled.  He will continue digoxin managed by cardiology, verapamil 120 mg daily, metoprolol 200 mg daily, and Eliquis 5 mg twice daily. ?

## 2021-10-21 ENCOUNTER — Telehealth: Payer: Self-pay

## 2021-10-21 NOTE — Telephone Encounter (Signed)
Alled and spoke with the patient and gave lab results.  Kojo Liby,cma

## 2021-10-21 NOTE — Telephone Encounter (Signed)
Patient states he is returning Carlos Crosby, CMA's, call regarding the results of his blood work.  Please call.

## 2021-11-08 ENCOUNTER — Other Ambulatory Visit: Payer: Self-pay | Admitting: Family Medicine

## 2021-11-08 DIAGNOSIS — I4891 Unspecified atrial fibrillation: Secondary | ICD-10-CM | POA: Diagnosis not present

## 2021-11-08 DIAGNOSIS — R2689 Other abnormalities of gait and mobility: Secondary | ICD-10-CM | POA: Diagnosis not present

## 2021-11-08 DIAGNOSIS — M170 Bilateral primary osteoarthritis of knee: Secondary | ICD-10-CM | POA: Diagnosis not present

## 2021-11-09 ENCOUNTER — Other Ambulatory Visit: Payer: Self-pay | Admitting: Family Medicine

## 2021-11-09 DIAGNOSIS — M170 Bilateral primary osteoarthritis of knee: Secondary | ICD-10-CM

## 2021-11-09 MED ORDER — TRAMADOL HCL 50 MG PO TABS: ORAL_TABLET | ORAL | 0 refills | Status: DC

## 2021-11-11 ENCOUNTER — Inpatient Hospital Stay: Payer: Medicare (Managed Care) | Attending: Nurse Practitioner

## 2021-11-11 ENCOUNTER — Inpatient Hospital Stay: Payer: Medicare (Managed Care)

## 2021-11-13 ENCOUNTER — Encounter: Payer: Self-pay | Admitting: Oncology

## 2021-11-16 ENCOUNTER — Encounter: Payer: Self-pay | Admitting: *Deleted

## 2021-11-16 ENCOUNTER — Other Ambulatory Visit: Payer: Self-pay

## 2021-11-17 ENCOUNTER — Inpatient Hospital Stay: Payer: Medicare (Managed Care)

## 2021-11-17 ENCOUNTER — Other Ambulatory Visit: Payer: Self-pay

## 2021-11-17 LAB — CBC WITH DIFFERENTIAL/PLATELET
Abs Immature Granulocytes: 0.03 10*3/uL (ref 0.00–0.07)
Basophils Absolute: 0.1 10*3/uL (ref 0.0–0.1)
Basophils Relative: 1 %
Eosinophils Absolute: 0.2 10*3/uL (ref 0.0–0.5)
Eosinophils Relative: 3 %
HCT: 50.4 % (ref 39.0–52.0)
Hemoglobin: 17.7 g/dL — ABNORMAL HIGH (ref 13.0–17.0)
Immature Granulocytes: 0 %
Lymphocytes Relative: 34 %
Lymphs Abs: 2.5 10*3/uL (ref 0.7–4.0)
MCH: 34.1 pg — ABNORMAL HIGH (ref 26.0–34.0)
MCHC: 35.1 g/dL (ref 30.0–36.0)
MCV: 97.1 fL (ref 80.0–100.0)
Monocytes Absolute: 0.6 10*3/uL (ref 0.1–1.0)
Monocytes Relative: 8 %
Neutro Abs: 3.9 10*3/uL (ref 1.7–7.7)
Neutrophils Relative %: 54 %
Platelets: 205 10*3/uL (ref 150–400)
RBC: 5.19 MIL/uL (ref 4.22–5.81)
RDW: 12.8 % (ref 11.5–15.5)
WBC: 7.3 10*3/uL (ref 4.0–10.5)
nRBC: 0 % (ref 0.0–0.2)

## 2021-11-17 LAB — FERRITIN: Ferritin: 90 ng/mL (ref 24–336)

## 2021-11-17 NOTE — Progress Notes (Signed)
Performed therapeutic phlebotomy after discussion with Dr Grayland Ormond. Hgb is 17.7, ferritin was drawn today so results aren't available. 436m of blood removed. VSS post procedure. Drank a bottle of water. Discharged to home.

## 2021-11-17 NOTE — Patient Instructions (Signed)

## 2021-11-19 ENCOUNTER — Encounter: Payer: Self-pay | Admitting: Cardiovascular Disease

## 2021-11-19 ENCOUNTER — Telehealth: Payer: Self-pay

## 2021-11-19 ENCOUNTER — Other Ambulatory Visit: Payer: Self-pay

## 2021-11-19 MED ORDER — DIGOXIN 250 MCG PO TABS: 0.2500 mg | ORAL_TABLET | Freq: Every day | ORAL | 2 refills | Status: DC

## 2021-11-19 MED ORDER — PRAVASTATIN SODIUM 80 MG PO TABS: 80.0000 mg | ORAL_TABLET | Freq: Every evening | ORAL | 3 refills | Status: DC

## 2021-11-19 NOTE — Telephone Encounter (Signed)
Error

## 2021-11-19 NOTE — Telephone Encounter (Signed)
Refills have been requested for the following medications:       pravastatin (PRAVACHOL) 80 MG tablet [Ryan Dunn]   Preferred pharmacy: [facility] Delivery method: Mail  Refilled Pravastin 80 mg qd #90, RF:3 to [facility]

## 2021-11-25 ENCOUNTER — Telehealth: Payer: Self-pay

## 2021-11-25 NOTE — Telephone Encounter (Signed)
Merrilee Seashore called from [place] to follow-up on two forms they faxed to [place], a standard written form and a physical therapy form.  Merrilee Seashore states they have not received the completed forms back from Dr. [name].  Merrilee Seashore asked that we please fax the completed forms to him at [phone].  Also, Merrilee Seashore states they were told that patient already has a power wheelchair from another company and he would like to verify whether or not this is correct.

## 2021-11-30 ENCOUNTER — Ambulatory Visit: Payer: Medicare (Managed Care) | Admitting: Internal Medicine

## 2021-12-08 ENCOUNTER — Encounter: Payer: Self-pay | Admitting: Oncology

## 2021-12-21 ENCOUNTER — Encounter: Payer: Self-pay | Admitting: *Deleted

## 2021-12-21 DIAGNOSIS — Z006 Encounter for examination for normal comparison and control in clinical research program: Secondary | ICD-10-CM

## 2021-12-21 NOTE — Research (Signed)
Message left for Mr Coble about [employer]. Encouraged him to call back with any questions.

## 2021-12-22 ENCOUNTER — Other Ambulatory Visit: Payer: Self-pay | Admitting: Family Medicine

## 2021-12-22 DIAGNOSIS — M170 Bilateral primary osteoarthritis of knee: Secondary | ICD-10-CM

## 2021-12-23 ENCOUNTER — Encounter: Payer: Self-pay | Admitting: *Deleted

## 2021-12-23 DIAGNOSIS — Z006 Encounter for examination for normal comparison and control in clinical research program: Secondary | ICD-10-CM

## 2021-12-23 MED ORDER — TRAMADOL HCL 50 MG PO TABS: ORAL_TABLET | ORAL | 0 refills | Status: DC

## 2021-12-23 NOTE — Research (Signed)
Spoke with Carlos Crosby states he would like information about essence. The consent sent via email for him to review.

## 2021-12-27 ENCOUNTER — Other Ambulatory Visit: Payer: Self-pay | Admitting: Family Medicine

## 2021-12-27 ENCOUNTER — Encounter: Payer: Self-pay | Admitting: Family Medicine

## 2021-12-27 DIAGNOSIS — M170 Bilateral primary osteoarthritis of knee: Secondary | ICD-10-CM

## 2021-12-27 NOTE — Telephone Encounter (Signed)
Possible duplicate: Hover to review recent actions on this medication

## 2021-12-30 ENCOUNTER — Encounter: Payer: Self-pay | Admitting: *Deleted

## 2021-12-30 DIAGNOSIS — Z006 Encounter for examination for normal comparison and control in clinical research program: Secondary | ICD-10-CM

## 2021-12-30 NOTE — Research (Signed)
Spoke with Mr Carlos Crosby about [employer]. He states that he would like to come in for a visit.  Scheduled for Aug 3 at 0830.

## 2022-01-05 ENCOUNTER — Encounter: Payer: Self-pay | Admitting: *Deleted

## 2022-01-05 DIAGNOSIS — Z006 Encounter for examination for normal comparison and control in clinical research program: Secondary | ICD-10-CM

## 2022-01-05 NOTE — Research (Signed)
Spoke with Carlos Crosby to remind him of his appointment tomorrow at 0830. Gave him the parking code and reminded him to be NPO except water.Ok to take meds with water. Voices understanding.

## 2022-01-06 ENCOUNTER — Other Ambulatory Visit: Payer: Self-pay

## 2022-01-06 ENCOUNTER — Encounter: Payer: Medicare (Managed Care) | Admitting: *Deleted

## 2022-01-06 VITALS — BP 106/54 | HR 62 | Temp 97.9°F | Resp 16 | Ht 71.0 in | Wt 352.8 lb

## 2022-01-06 DIAGNOSIS — Z006 Encounter for examination for normal comparison and control in clinical research program: Secondary | ICD-10-CM

## 2022-01-06 NOTE — Research (Signed)
[name] Consent     Subject Name: Carlos A.  Carlos Crosby   Subject met inclusion and exclusion criteria.  The informed consent form, study requirements and expectations were reviewed with the subject and questions and concerns were addressed prior to the signing of the consent form.  The subject verbalized understanding of the trial requirements.  The subject agreed to participate in the [name]  trial and signed the informed consent at [place] on 06-Jan-2022.  The informed consent was obtained prior to performance of any protocol-specific procedures for the subject.  A copy of the signed informed consent was given to the subject and a copy was placed in the subject's medical record.   Rockell Faulks Ward  Protocol number 1 Consent version  2   [name]  [phone]    Site [id number]  SUBJECT ID:  [id number]                    DATE:   06-Jan-2022      '[x]'  MALE                            '[]'  MALE AGE: ETHINICITY:   '[]'  HISPANIC/LATINO      '[x]'  NON- HISPANIC/LATINO RACE:           '[x]'   WHITE             '[]'  BLACK/AFRICAN AMERICAN                         '[]'   ASIAN              '[]'  AMERICAN INDIAN/[place] NATIVE                         '[]'   NATIVE [place]/OTHER PACIFIC ISLANDER                         '[]'   OTHER  FUTURE RESEARCH '[x]'  USE OF SAMPLES FOR FUTURE RESEARCH '[x]'  Consented for Sub study CTA  INCLUSION CRITERIA Consent      '[x]'    Pregnancy authorization '[]'   AGE 65 or greater '[x]'   Triglycerides fasting 150 or greater with either: '[x]'   Dx of ASCVD (CAD, CVA, PAD) OR '[]'   Increased risk for ASCVD as below '[]'   Type 2 DM OR 2 or more below '[]'   Men 51 or greater Woman 57 or greater '[x]'  '[]'    Woman with Hx of preeclampsia or premature menopause (before 55) '[]'    Family Hx of premature ASCDD (Before 14 for males, or before 37 for females  '[]'    Current Tobacco use  '[]'    Metabolic syndrome '[]'    Hypertension with Treatment  '[x]'    CKD stage 3 Or (GFR 30-59)  '[]'    LDL-C 160 or greater LDL-C 100 or  greater on therapy to lower  '[]'    Elevated high-sensitivity C Reactive protein (>2.0)  '[]'   Elevated lipoprotein (a) (>24m/dL or 124nmol/L) OR  '[]'   Triglycerides fasting 500 or greater  '[]'   Lipid-lowering med (for at least 4 weeks) [place] to comply with diet and lifestyle recommendations  '[x]'   Females must be non-pregnant and non-lactating and EITHER  '[]'   Surgically sterile, post-menopausal, abstinent OR Use highly effective contraceptive at time of consent until at least 30 weeks after last dose of study drug  '[]'   Males must be surgical sterile, abstinent  or using a highly effective contraceptive at time of consent until at least 30 weeks after the last dose of study drug   '[x]'     EXCLUSION CRITERIA           N/A                                            '[x]'  Major surgery, peripheral revascularization, or non-urgent PCI within 3 months prior to screening, or planned major surgery or major procedure during the study '[]'   Active pancreatitis within 4 weeks prior to screening '[]'   Acute coronary syndrome or CVA/TIA within 3 months of screening '[]'   Screening labs: ALT or AST >3.0 x ULN Total bilirubin >1.5 ULN unless due to Gilbert's syndrome GFR >30 Urine Protein/creatine ratio >500 Uncontrolled HTN (BP>180/100 despite Treatment Uncontrolled hypothyroidism TSH>1.5 and T4 < LLN, or Hormone therapy not stable for 4 weeks or greater  '[]'  '[]'  '[]'  '[]'  '[]'  '[]'   DM newly dx within 12 weeks of screening A1c > 9.5 at screening  '[]'  '[]'   Change in basal insulin >20% within 3 months prior to screening  '[]'   Type 1 Dm: episode of DKA or > 3 episodes of severe hypo glycerides with on 6 months prior to screening   Active infections, HIV, Hep C, Hep B '[]'   Active infection requiring systemic antiviral or antimicrobial tx that will not be complete prior to study day 1 or active Covid 19 infection not resolved by study day 1 '[]'   Malignancy within 5 years (except for non-melanoma skin ca, cervical in  situ ca, breast ductal ca in situ or stage 1 prostate Ca that has been tx '[]'   Hypersensitivity to the active substance (olezarsen or placebo) '[]'   Tx with another investigational drug or devise within 1 month or screening  '[]'   Previous tx with an oligonucleotide within 4 months of screening '[]'   Con meds/ procedure restrictions:   Systemic corticosteroids of anabolic steroids within 6 weeks prior to screening and during the study unless approved   '[]'   Use of bile acids resins (colestipol or Colesevelam) within 4 weeks prior to screening or planned during the study  '[]'   Plasma apheresis within 4 weeks prior to screening or planned during the study  '[]'   Change in meds known to exacerbate hypertriglyceridemia (beta blockers, thiazides, isotretinoin, oral antidiabetic meds, tamoxifen, estrogens or progestins within 4 weeks prior to screening  '[]'    Change or expected need for significant change in titration of therapies known to significantly reduce TG (GLP-1 agonists, other incretin mimetics, Phentermine/topiramate, naltrexone/bupropion, Xenical, or bariatric surgery within 3 months prior to screening  '[]'    Change in antipsychotic meds within 30 days of screening or >414m within 60 days of Screening  '[]'    Blood or plasma donation of 50-4980mwithin 30 days of screening or>499 within 60 days of screening '[]'   Unwilling to comply with procedures, following up, or unwillingness to cooperate fully with the investigator '[]'   ETOH abuse or recent (<1 year) or other substance abuse '[]'        Screening Run-In Clinic Visit    Date of Visit: 06-Jan-2022   Subject #: [id number]    During this visit the following activities were completed:  '[x]' Reading, Signing and Understanding the informed Consent   '[x]' Review Inclusion/Exclusion Criteria  '[x]' Vital Signs, Height, & Weight:  - Blood  pressure: 106/54 (Subject sat supine for at least 5 minutes before blood pressure was performed) - Heart  rate:62 - Temperature:97.9 - Respiratory Rate:16 - Oxygen Saturation:97% - Weight:352.8 lbs - Height: 5 ft 11 in  '[x]' Physical Exam done by PI or Sub-I  '[x]' Review Subjects Medical History & Concomitant Medications  '[x]' Review Any Adverse Events/ Serious Adverse Events  '[x]' Review of any ER Visits, Hospitalizations and Inpatient Days  '[x]' 12-Lead ECG (Subject sat supine for at least 5 minutes before this was performed)  *All ECG's completed will be available in subjects binder   '[x]'  Subject fasting   '[x]'  Blood and Urine specimens collected per protocol   '[x]' Extended Urinalysis  '[x]' Diet/Lifestyle/Alcohol Counseling with Subject  '[x]' Education/teaching subject on importance of completing the daily diary   '[x]' Education on the importance of complying with contraception precautions during study with subject agreement   Mr Vanderloop here for [name] Screening run in visit. Consent signed after giving pt time to review and ask questions. VS taken at 0835, Blood drawn at 0855, urine obtained at 0917. Dr Lia Foyer here to complete exam and review EKG. Mr Neisen reports no ED or urgent care visits, no abd pain, or other pain at present. He reports he can hold his breath for more than 6 sec, No allergy to contrast, no problems with NTG, and no prior CABG. Medications reviewed states he no longer uses Peridex, but no other changes in meds noted. Scheduled next visit for Aug 18 th at 0830.  Current Outpatient Medications:    acetaminophen (TYLENOL) 500 MG tablet, Take 1,000 mg by mouth every 6 (six) hours as needed., Disp: , Rfl:    Cholecalciferol (VITAMIN D) 2000 units CAPS, daily. , Disp: , Rfl:    digoxin (LANOXIN) 0.25 MG tablet, Take 1 tablet (0.25 mg total) by mouth daily., Disp: 90 tablet, Rfl: 2   ELIQUIS 5 MG TABS tablet, TAKE 1 TABLET TWICE DAILY, Disp: 180 tablet, Rfl: 1   furosemide (LASIX) 20 MG tablet, Take 1 tablet (20 mg total) by mouth daily as needed for edema., Disp: 90 tablet, Rfl:  3   loratadine (CLARITIN) 10 MG tablet, Take 10 mg by mouth daily., Disp: , Rfl:    metoprolol succinate (TOPROL-XL) 100 MG 24 hr tablet, TAKE 2 TABLETS EVERY MORNING  AND TAKE 1 TABLET EVERY EVENING, Disp: 270 tablet, Rfl: 3   Multiple Vitamins-Minerals (CENTRUM SILVER ADULT 50+) TABS, Take 1 tablet by mouth daily., Disp: , Rfl:    pravastatin (PRAVACHOL) 80 MG tablet, Take 1 tablet (80 mg total) by mouth every evening. PLEASE CALL TO SCHEDULE OFFICE VISIT FOR FURTHER REFILLS. THANK YOU!, Disp: 90 tablet, Rfl: 3   sacubitril-valsartan (ENTRESTO) 24-26 MG, Take 1 tablet by mouth 2 (two) times daily., Disp: 180 tablet, Rfl: 3   traMADol (ULTRAM) 50 MG tablet, TAKE 1 TABLET(50 MG) BY MOUTH EVERY 6 HOURS AS NEEDED FOR PAIN, Disp: 90 tablet, Rfl: 0   verapamil (CALAN-SR) 120 MG CR tablet, Take 1 tablet (120 mg total) by mouth daily., Disp: 90 tablet, Rfl: 2   chlorhexidine (PERIDEX) 0.12 % solution, , Disp: , Rfl:

## 2022-01-10 NOTE — Research (Addendum)
Carlos Crosby [name] Screening run in 06-Jan-2022         Chemistry: Hs-C-Reactive Protein 3.1 mg/L       '[]'$ Clinically Significant  '[x]'$ Not Clinically Significant    Hematology: MCV 99.8   fL                       '[]'$ Clinically Significant  '[x]'$ Not Clinically Significant   Urine Chemistry        Urine Albumin 3.24 mg/dL               '[]'$ Clinically Significant  '[x]'$ Not Clinically Significant Albumin Creatinine Ratio 64 mg/g   '[]'$ Clinically Significant  '[x]'$ Not Clinically Significant  Lipids:  Triglyceride  178    mg/dL                 '[]'$ Clinically Significant  '[x]'$ Not Clinically Significant HDL-Cholesterol 33 mg/dL               '[]'$ Clinically Significant  '[x]'$ Not Clinically Significant   Any further action needed to be taken per the PI?  No  Pixie Casino, MD, [facility], [place] Director of the Advanced Lipid Disorders &  Cardiovascular Risk Reduction Clinic Diplomate of the American Board of Clinical Lipidology Attending Cardiologist  Direct Dial: [phone]  Fax: [phone]  Website:  [email or web address]

## 2022-01-10 NOTE — Progress Notes (Signed)
Mr. Bones is a very delightful 65 year old gentleman with hemochromatosis, chronic atrial fibrillation, and reduced ejection fraction with prior EF improved to 45-50%.  He is on CPAP for sleep apnea.  He had a cath at [facility] in 2012 and apparently no significant CAD.  Patient has chronic elevation of triglycerides.  He gets periodic phlebotomy, and sees Dr. Rockey Situ for chronic atrial fibrillation.  He is on a statin, and chronic anticoagulation with Eliquis.  We have discussed the trial in great detail with the patient including the objectives, and what is known about the IONIS ASO.    Alert, oriented male in NAD Wt 352.8 lbs, P62 irregularly irregular, T 97.8, BP106/54 O2 97% Lungs clear Cor irregularly irreglar without murmur Abd - mod obese, no masses or tenderness, but limited Ext  moderate non pitting edema Neuro non focal  Patient is entering screening at this point, and we will assess once laboratories have been reviewed.    Patient meets inclusion and exclusion for screening.  He expresses a desire to participate.    Loretha Brasil. Lia Foyer, MD, [facility], [place] Director, [facility]

## 2022-01-11 NOTE — Research (Addendum)
Loney Loh [name] Screening Run in 06-Jan-2022                Lipoprotein (a) Molar  204.1 nmol/L  '[x]'$ Clinically Significant  '[]'$ Not Clinically Significant        Any further action needed to be taken per the PI?  No  Pixie Casino, MD, [facility], [place] Director of the Advanced Lipid Disorders &  Cardiovascular Risk Reduction Clinic Diplomate of the American Board of Clinical Lipidology Attending Cardiologist  Direct Dial: [phone]  Fax: [phone]  Website:  [email or web address]

## 2022-01-14 NOTE — Research (Addendum)
[name] [name] Screening Run in 06-Jan-2022   Apolipoprotein B48 1.45 mg/dL           [] Clinically Significant  [x] Not Clinically Significant    Chrystie Nose, MD, [facility], FACP  [place]  [facility]  Medical Director of the Advanced Lipid Disorders &  Cardiovascular Risk Reduction Clinic Diplomate of the American Board of Clinical Lipidology Attending Cardiologist  Direct Dial: [phone]  Fax: [phone]  Website:  [email or web address]

## 2022-01-21 ENCOUNTER — Other Ambulatory Visit: Payer: Self-pay

## 2022-01-21 ENCOUNTER — Encounter: Payer: Medicare (Managed Care) | Admitting: *Deleted

## 2022-01-21 DIAGNOSIS — Z006 Encounter for examination for normal comparison and control in clinical research program: Secondary | ICD-10-CM

## 2022-01-21 NOTE — Research (Signed)
     Screening Qualification Visit   Subject Number: [id number]                         Date:21-Jan-2022    '[x]'$ Inclusion/Exclusion Criteria   '[x]'$ Collection of labs per protocol   '[x]'$ Assessment of ER Visits, Hospitalization and Inpatient Days  '[x]'$ Adverse Events and Concomitant Medications  Mr Carlos Crosby here for [name] Qualification visit. He reports no abd pain,or other pain. No visits to the Ed or Urgent care since last visit. Medications reviewed- no changes noted. Scheduled next visit for Aug 31 at 0800.     Current Outpatient Medications:    acetaminophen (TYLENOL) 500 MG tablet, Take 1,000 mg by mouth every 6 (six) hours as needed., Disp: , Rfl:    Cholecalciferol (VITAMIN D) 2000 units CAPS, daily. , Disp: , Rfl:    digoxin (LANOXIN) 0.25 MG tablet, Take 1 tablet (0.25 mg total) by mouth daily., Disp: 90 tablet, Rfl: 2   ELIQUIS 5 MG TABS tablet, TAKE 1 TABLET TWICE DAILY, Disp: 180 tablet, Rfl: 1   furosemide (LASIX) 20 MG tablet, Take 1 tablet (20 mg total) by mouth daily as needed for edema., Disp: 90 tablet, Rfl: 3   loratadine (CLARITIN) 10 MG tablet, Take 10 mg by mouth daily., Disp: , Rfl:    metoprolol succinate (TOPROL-XL) 100 MG 24 hr tablet, TAKE 2 TABLETS EVERY MORNING  AND TAKE 1 TABLET EVERY EVENING, Disp: 270 tablet, Rfl: 3   Multiple Vitamins-Minerals (CENTRUM SILVER ADULT 50+) TABS, Take 1 tablet by mouth daily., Disp: , Rfl:    pravastatin (PRAVACHOL) 80 MG tablet, Take 1 tablet (80 mg total) by mouth every evening. PLEASE CALL TO SCHEDULE OFFICE VISIT FOR FURTHER REFILLS. THANK YOU!, Disp: 90 tablet, Rfl: 3   sacubitril-valsartan (ENTRESTO) 24-26 MG, Take 1 tablet by mouth 2 (two) times daily., Disp: 180 tablet, Rfl: 3   traMADol (ULTRAM) 50 MG tablet, TAKE 1 TABLET(50 MG) BY MOUTH EVERY 6 HOURS AS NEEDED FOR PAIN, Disp: 90 tablet, Rfl: 0   verapamil (CALAN-SR) 120 MG CR tablet, Take 1 tablet (120 mg total) by mouth daily., Disp: 90 tablet, Rfl: 2    chlorhexidine (PERIDEX) 0.12 % solution, , Disp: , Rfl:

## 2022-01-24 NOTE — Research (Cosign Needed Addendum)
Loney Loh [name] Qualification 21-Jan-2022        Hematology: MCV 99.6 fl                             '[]'$ Clinically Significant  '[x]'$ Not Clinically Significant    Lipids:  Triglyceride  224  mg/dL                  '[]'$ Clinically Significant  '[x]'$ Not Clinically Significant HDL -Cholesterol (ppt) 32 mg/dL     '[]'$ Clinically Significant  '[x]'$ Not Clinically Significant   Any further action needed to be taken per the PI? No  Pixie Casino, MD, [facility], [place] Director of the Advanced Lipid Disorders &  Cardiovascular Risk Reduction Clinic Diplomate of the American Board of Clinical Lipidology Attending Cardiologist  Direct Dial: [phone]  Fax: [phone]  Website:  [email or web address]

## 2022-01-25 NOTE — Research (Cosign Needed)
Carlos Crosby [name]  21-Jan-2022  Lipoprotein (a) Molar  202.4 nmol/L '[x]'$ Clinically Significant  '[]'$ Not Clinically Significant   Any further action needed to be taken per the PI?  Yes  LP(a) is significantly elevated.  Pixie Casino, MD, [facility], [place] Director of the Advanced Lipid Disorders &  Cardiovascular Risk Reduction Clinic Diplomate of the American Board of Clinical Lipidology Attending Cardiologist  Direct Dial: [phone]  Fax: [phone]  Website:  [email or web address]

## 2022-01-26 ENCOUNTER — Ambulatory Visit: Payer: Medicare (Managed Care) | Admitting: Family Medicine

## 2022-01-26 ENCOUNTER — Telehealth: Payer: Self-pay | Admitting: Cardiology

## 2022-01-26 NOTE — Telephone Encounter (Signed)
Thanks I have let Clarise Cruz wallace know

## 2022-01-31 NOTE — Research (Addendum)
Carlos Crosby Qualification 21-Jan-2022    Apolipoprotein B48 1.53 mg/dL       [] Clinically Significant  [x] Not Clinically Significant  Any further action needed to be taken per the PI?  No  Carlos Nose, MD, [facility], FACP  [place]  [facility]  Medical Director of the Advanced Lipid Disorders &  Cardiovascular Risk Reduction Clinic Diplomate of the American Board of Clinical Lipidology Attending Cardiologist  Direct Dial: [phone]  Fax: [phone]  Website:  [email or web address]

## 2022-02-02 ENCOUNTER — Encounter: Payer: Self-pay | Admitting: *Deleted

## 2022-02-02 DIAGNOSIS — Z006 Encounter for examination for normal comparison and control in clinical research program: Secondary | ICD-10-CM

## 2022-02-02 NOTE — Research (Signed)
Spoke with Carlos Crosby to remind him of his appointment with research at 0800. Gave him the parking code and reminded to be NPO. Voices understanding.

## 2022-02-03 ENCOUNTER — Other Ambulatory Visit: Payer: Self-pay

## 2022-02-03 ENCOUNTER — Other Ambulatory Visit: Payer: Self-pay | Admitting: Family Medicine

## 2022-02-03 ENCOUNTER — Encounter: Payer: Medicare (Managed Care) | Admitting: *Deleted

## 2022-02-03 VITALS — BP 120/83 | HR 62 | Temp 97.2°F | Resp 18 | Ht 71.0 in | Wt 352.0 lb

## 2022-02-03 DIAGNOSIS — M170 Bilateral primary osteoarthritis of knee: Secondary | ICD-10-CM

## 2022-02-03 DIAGNOSIS — Z006 Encounter for examination for normal comparison and control in clinical research program: Secondary | ICD-10-CM

## 2022-02-03 MED ORDER — STUDY - ESSENCE - OLEZARSEN 50 MG, 80 MG OR PLACEBO SQ INJECTION (PI-HILTY)
50.0000 mg | INJECTION | Freq: Once | SUBCUTANEOUS | Status: DC
  Administered 2022-03-03: 50 mg via SUBCUTANEOUS
  Filled 2022-02-03: qty 0.5

## 2022-02-03 MED ORDER — TRAMADOL HCL 50 MG PO TABS: ORAL_TABLET | ORAL | 0 refills | Status: DC

## 2022-02-03 NOTE — Research (Signed)
     TREATMENT DAY 1- STUDY WEEK 1    Subject Number: [id number]              Randomization Number:[id number]            Date:03-Feb-2022      '[x]'$ Vital Signs Collected - Blood Pressure:120/83 - Height: 5 ft 11 in - Weight:352.0 - Heart Rate:62 - Respiratory Rate:18 - Temperature:97.2 - Oxygen Saturation:98%  '[x]'$  Physical Exam Completed by PI or SUB-I  '[x]'$  12-lead ECG  '[x]'$  Extended Urinalysis   '[x]'$  Lab collection per protocol  '[x]'$   (abdominal pain only) since last visit  '[x]'$  Assessment of ER Visits, Hospitalizations, and Inpatient Days  '[x]'$  Adverse Events and Concomitant Medications  '[x]'$  Diet, Lifestyle, and Alcohol Counseling   '[x]'$  Study Drug: [place] Injection    Carlos Crosby here for Week 1 day 1 [name] visit. He repots no abd pain, or other pain. No visits to the ED or urgent care since last seen. VS taken at [facility], Blood drawn at 0824, and Urine obtained at 0850. Voices no changes in meds. Dr Lia Foyer did exam and reviewed EKG. Injection was given in right lower abd. Tol well.  Monitored for 30 mins. No problems noted. Next visit scheduled for Sept 28 at 0800.   Current Outpatient Medications:    acetaminophen (TYLENOL) 500 MG tablet, Take 1,000 mg by mouth every 6 (six) hours as needed., Disp: , Rfl:    Cholecalciferol (VITAMIN D) 2000 units CAPS, daily. , Disp: , Rfl:    digoxin (LANOXIN) 0.25 MG tablet, Take 1 tablet (0.25 mg total) by mouth daily., Disp: 90 tablet, Rfl: 2   ELIQUIS 5 MG TABS tablet, TAKE 1 TABLET TWICE DAILY, Disp: 180 tablet, Rfl: 1   furosemide (LASIX) 20 MG tablet, Take 1 tablet (20 mg total) by mouth daily as needed for edema., Disp: 90 tablet, Rfl: 3   loratadine (CLARITIN) 10 MG tablet, Take 10 mg by mouth daily., Disp: , Rfl:    metoprolol succinate (TOPROL-XL) 100 MG 24 hr tablet, TAKE 2 TABLETS EVERY MORNING  AND TAKE 1 TABLET EVERY EVENING, Disp: 270 tablet, Rfl: 3   Multiple Vitamins-Minerals (CENTRUM SILVER ADULT 50+) TABS, Take 1 tablet by mouth  daily., Disp: , Rfl:    pravastatin (PRAVACHOL) 80 MG tablet, Take 1 tablet (80 mg total) by mouth every evening. PLEASE CALL TO SCHEDULE OFFICE VISIT FOR FURTHER REFILLS. THANK YOU!, Disp: 90 tablet, Rfl: 3   sacubitril-valsartan (ENTRESTO) 24-26 MG, Take 1 tablet by mouth 2 (two) times daily., Disp: 180 tablet, Rfl: 3   Study - [name] - olezarsen 50 mg, 80 mg or placebo SQ injection (PI-Hilty), Inject 50 mg into the skin every 28 (twenty-eight) days. For Investigational Use Only. Injection subcutaneously in protocol approved injection sites (abdomen, thigh or outer area of upper arm) every 4 weeks., Disp: , Rfl:    traMADol (ULTRAM) 50 MG tablet, TAKE 1 TABLET(50 MG) BY MOUTH EVERY 6 HOURS AS NEEDED FOR PAIN, Disp: 90 tablet, Rfl: 0   verapamil (CALAN-SR) 120 MG CR tablet, Take 1 tablet (120 mg total) by mouth daily., Disp: 90 tablet, Rfl: 2   chlorhexidine (PERIDEX) 0.12 % solution, , Disp: , Rfl:   Current Facility-Administered Medications:    Study - [name] - olezarsen 50 mg, 80 mg or placebo SQ injection (PI-Hilty), 50 mg, Subcutaneous, Once, Hilty, Nadean Corwin, MD

## 2022-02-07 NOTE — Progress Notes (Signed)
Patient here for Day 1 Treatment 1.  He qualifies for [name].  He has underlying hemachromatosis, chronic atrial fib on Eliquis, and reduced overall EF.  He had cath in 2012 with no obvious disease.  He also had prior PE.  Triglycerides chronically elevated.    Alert, oriented, No acute distress. Excellent conversation in detail.  BP 120/83 R 18 P62 T 97.2 Overweight Lungs clear Cor irreg irregular Abdomen - no obvious massses Ext - non pitting edema  Prior ECG consistent with low voltage, atrial fib. Unchanged.   Patient received first dose of therapy today for elevated triglycerides.   Loretha Brasil. Lia Foyer, MD Medical Director, [facility]

## 2022-02-08 NOTE — Research (Cosign Needed)
Loney Loh [name] Week 1 Day 1 03-Feb-2022          Chemistry:  Uric Acid 9.0  mg/dL                        '[]'$ Clinically Significant  '[x]'$ Not Clinically Significant Hs-C-Reactive Protein 4.3 mg/L       '[]'$ Clinically Significant  '[x]'$ Not Clinically Significant    Hematology: MCV 99.2 fL                          '[]'$ Clinically Significant  '[x]'$ Not Clinically Significant MCH  35 pg                           '[]'$ Clinically Significant  '[x]'$ Not Clinically Significant                  Urine Chemistry: Urine Albumin 4.75 mg/dL                '[]'$ Clinically Significant  '[x]'$ Not Clinically Significant Albumin Creatinine ratio 49 mg/g      '[]'$ Clinically Significant  '[x]'$ Not Clinically Significant  Lipids:  Triglyceride     170   mg/dL               '[]'$ Clinically Significant  '[x]'$ Not Clinically Significant HDL- Cholesterol  33 mg/dL             '[]'$ Clinically Significant  '[x]'$ Not Clinically Significant   Any further action needed to be taken per the PI? No  Pixie Casino, MD, [facility], [place] Director of the Advanced Lipid Disorders &  Cardiovascular Risk Reduction Clinic Diplomate of the American Board of Clinical Lipidology Attending Cardiologist  Direct Dial: [phone]  Fax: [phone]  Website:  [email or web address]

## 2022-02-15 ENCOUNTER — Inpatient Hospital Stay: Payer: Medicare (Managed Care) | Attending: Nurse Practitioner

## 2022-02-15 ENCOUNTER — Encounter: Payer: Self-pay | Admitting: Oncology

## 2022-02-15 ENCOUNTER — Other Ambulatory Visit: Payer: Self-pay

## 2022-02-15 DIAGNOSIS — Z0000 Encounter for general adult medical examination without abnormal findings: Secondary | ICD-10-CM | POA: Diagnosis not present

## 2022-02-15 LAB — CBC WITH DIFFERENTIAL/PLATELET
Abs Immature Granulocytes: 0.03 10*3/uL (ref 0.00–0.07)
Basophils Absolute: 0.1 10*3/uL (ref 0.0–0.1)
Basophils Relative: 1 %
Eosinophils Absolute: 0.2 10*3/uL (ref 0.0–0.5)
Eosinophils Relative: 2 %
HCT: 49.7 % (ref 39.0–52.0)
Hemoglobin: 17.3 g/dL — ABNORMAL HIGH (ref 13.0–17.0)
Immature Granulocytes: 0 %
Lymphocytes Relative: 33 %
Lymphs Abs: 2.4 10*3/uL (ref 0.7–4.0)
MCH: 33.7 pg (ref 26.0–34.0)
MCHC: 34.8 g/dL (ref 30.0–36.0)
MCV: 96.7 fL (ref 80.0–100.0)
Monocytes Absolute: 0.7 10*3/uL (ref 0.1–1.0)
Monocytes Relative: 9 %
Neutro Abs: 3.9 10*3/uL (ref 1.7–7.7)
Neutrophils Relative %: 55 %
Platelets: 199 10*3/uL (ref 150–400)
RBC: 5.14 MIL/uL (ref 4.22–5.81)
RDW: 12.7 % (ref 11.5–15.5)
WBC: 7.3 10*3/uL (ref 4.0–10.5)
nRBC: 0 % (ref 0.0–0.2)

## 2022-02-15 LAB — FERRITIN: Ferritin: 75 ng/mL (ref 24–336)

## 2022-02-15 NOTE — Research (Addendum)
[name] [name] Week 1 Day 1 03-Feb-2022             Apolipoprotein B48  0.90 mg/dL              [] Clinically Significant  [x] Not Clinically Significant     Any further action needed to be taken per the PI?  No  Chrystie Nose, MD, [facility], FACP  [place]  [facility]  Medical Director of the Advanced Lipid Disorders &  Cardiovascular Risk Reduction Clinic Diplomate of the American Board of Clinical Lipidology Attending Cardiologist  Direct Dial: [phone]  Fax: [phone]  Website:  [name]

## 2022-02-17 ENCOUNTER — Inpatient Hospital Stay (HOSPITAL_BASED_OUTPATIENT_CLINIC_OR_DEPARTMENT_OTHER): Payer: Medicare (Managed Care) | Admitting: Medical Oncology

## 2022-02-17 ENCOUNTER — Telehealth: Payer: Self-pay | Admitting: *Deleted

## 2022-02-17 DIAGNOSIS — D582 Other hemoglobinopathies: Secondary | ICD-10-CM

## 2022-02-17 NOTE — Progress Notes (Signed)
Virtual Visit Progress Note  Mr. Mateus,you are scheduled for a virtual visit with your provider today.    Just as we do with appointments in the office, we must obtain your consent to participate.  Your consent will be active for this visit and any virtual visit you may have with one of our providers in the next 365 days.    If you have a MyChart account, I can also send a copy of this consent to you electronically.  All virtual visits are billed to your insurance company just like a traditional visit in the office.  As this is a virtual visit, video technology does not allow for your provider to perform a traditional examination.  This may limit your provider's ability to fully assess your condition.  If your provider identifies any concerns that need to be evaluated in person or the need to arrange testing such as labs, EKG, etc, we will make arrangements to do so.    Although advances in technology are sophisticated, we cannot ensure that it will always work on either your end or our end.  If the connection with a video visit is poor, we may have to switch to a telephone visit.  With either a video or telephone visit, we are not always able to ensure that we have a secure connection.   I need to obtain your verbal consent now.   Are you willing to proceed with your visit today?   [name] has provided verbal consent on 02/17/2022 for a virtual visit (video or telephone).   Hughie Closs, PA-C 02/17/2022  1:57 PM    I connected with [name] on 02/17/22 at  1:30 PM EDT by video enabled telemedicine visit and verified that I am speaking with the correct person using two identifiers.   I discussed the limitations, risks, security and privacy concerns of performing an evaluation and management service by telemedicine and the availability of in-person appointments. I also discussed with the patient that there may be a patient responsible charge related to this service. The patient  expressed understanding and agreed to proceed.   Other persons participating in the visit and their role in the encounter: None   Patient's location: Parked car in [place]  Provider's location: Clinic   Chief Complaint: Hemochromatosis     Patient Care Team: Leone Haven, MD as PCP - General (Family Medicine) Minna Merritts, MD as PCP - Cardiology (Cardiology) Deboraha Sprang, MD as PCP - Electrophysiology (Cardiology) Minna Merritts, MD as Consulting Physician (Cardiology)   Name of the patient: Carlos Crosby  [id number]  [birth year]   Date of visit: 02/17/22  History of Presenting Illness- He is currently in a study looking at his triglycerides. Unsure if he is getting placebo or treatment- not having any known changes. In terms of his polycythemia and hemochromatosis he is asymptomatic. Has chronically Never smoker, he has sleep apnea but religiously wears his CPAP machine.   Review of systems- ROS   Allergies  Allergen Reactions   Allopurinol Hives and Swelling   Diltiazem Itching and Rash   Chlorhexidine      lump in throat    Ondansetron Rash   Penicillins Other (See Comments) and Nausea Only    Pt unsure of rxn; showed up on allergy test Pt unsure of rxn; showed up on allergy test Pt unsure of rxn; showed up on allergy test And dirrhea     Past Medical History:  Diagnosis Date   Chronic a-fib (HCC)    Chronic systolic CHF (congestive heart failure) (HCC)    Depression    Hemochromatosis    History of kidney stones    History of pulmonary embolism    Hyperlipidemia    Hypertension    Migraine    Obesity    OSA (obstructive sleep apnea)    on CPAP   Osteoarthritis    bilateral knees   [place] spotted fever 2011    Past Surgical History:  Procedure Laterality Date   CARDIAC CATHETERIZATION  2012   [facility]   KNEE SURGERY     right knee    LITHOTRIPSY  07/31/2014   URETERAL STENT PLACEMENT  2016   URETEROSCOPY      Social History    Socioeconomic History   Marital status: Married    Spouse name: Not on file   Number of children: Not on file   Years of education: Not on file   Highest education level: Not on file  Occupational History   Not on file  Tobacco Use   Smoking status: Never   Smokeless tobacco: Never  Vaping Use   Vaping Use: Never used  Substance and Sexual Activity   Alcohol use: No    Comment: occasional; 3 times a year   Drug use: No   Sexual activity: Not Currently  Other Topics Concern   Not on file  Social History Narrative   Not on file   Social Determinants of Health   Financial Resource Strain: Low Risk  (08/09/2021)   Overall Financial Resource Strain (CARDIA)    Difficulty of Paying Living Expenses: Not hard at all  Food Insecurity: No Food Insecurity (08/09/2021)   Hunger Vital Sign    Worried About Running Out of Food in the Last Year: Never true    Silt in the Last Year: Never true  Transportation Needs: No Transportation Needs (08/09/2021)   PRAPARE - Hydrologist (Medical): No    Lack of Transportation (Non-Medical): No  Physical Activity: Sufficiently Active (08/09/2021)   Exercise Vital Sign    Days of Exercise per Week: 3 days    Minutes of Exercise per Session: 90 min  Stress: No Stress Concern Present (08/09/2021)   [place]    Feeling of Stress : Not at all  Social Connections: Unknown (08/09/2021)   Social Connection and Isolation Panel [NHANES]    Frequency of Communication with Friends and Family: More than three times a week    Frequency of Social Gatherings with Friends and Family: More than three times a week    Attends Religious Services: Not on file    Active Member of Clubs or Organizations: Yes    Attends Archivist Meetings: More than 4 times per year    Marital Status: Not on file  Intimate Partner Violence: Not At Risk (08/09/2021)    Humiliation, Afraid, Rape, and Kick questionnaire    Fear of Current or Ex-Partner: No    Emotionally Abused: No    Physically Abused: No    Sexually Abused: No    Immunization History  Administered Date(s) Administered   Influenza,inj,Quad PF,6+ Mos 03/20/2013, 02/27/2015, 01/26/2017, 02/19/2018, 02/05/2019, 02/25/2020, 02/19/2021   Influenza-Unspecified 03/08/2014, 02/26/2016, 02/17/2018, 02/19/2018   Moderna SARS-COV2 Booster Vaccination 09/01/2020, 02/19/2021   Moderna Sars-Covid-2 Vaccination 08/03/2019, 08/31/2019, 03/30/2020   PNEUMOCOCCAL CONJUGATE-20 10/19/2021   Pneumococcal Polysaccharide-23 05/06/2015  Family History  Problem Relation Age of Onset   Thyroid disease Mother    Cancer Father        lung   Arthritis Father    Hypertension Father    Cancer Paternal Grandfather        Throat cancer     Current Outpatient Medications:    acetaminophen (TYLENOL) 500 MG tablet, Take 1,000 mg by mouth every 6 (six) hours as needed., Disp: , Rfl:    chlorhexidine (PERIDEX) 0.12 % solution, , Disp: , Rfl:    Cholecalciferol (VITAMIN D) 2000 units CAPS, daily. , Disp: , Rfl:    digoxin (LANOXIN) 0.25 MG tablet, Take 1 tablet (0.25 mg total) by mouth daily., Disp: 90 tablet, Rfl: 2   ELIQUIS 5 MG TABS tablet, TAKE 1 TABLET TWICE DAILY, Disp: 180 tablet, Rfl: 1   furosemide (LASIX) 20 MG tablet, Take 1 tablet (20 mg total) by mouth daily as needed for edema., Disp: 90 tablet, Rfl: 3   loratadine (CLARITIN) 10 MG tablet, Take 10 mg by mouth daily., Disp: , Rfl:    metoprolol succinate (TOPROL-XL) 100 MG 24 hr tablet, TAKE 2 TABLETS EVERY MORNING  AND TAKE 1 TABLET EVERY EVENING, Disp: 270 tablet, Rfl: 3   Multiple Vitamins-Minerals (CENTRUM SILVER ADULT 50+) TABS, Take 1 tablet by mouth daily., Disp: , Rfl:    pravastatin (PRAVACHOL) 80 MG tablet, Take 1 tablet (80 mg total) by mouth every evening. PLEASE CALL TO SCHEDULE OFFICE VISIT FOR FURTHER REFILLS. THANK YOU!, Disp: 90  tablet, Rfl: 3   sacubitril-valsartan (ENTRESTO) 24-26 MG, Take 1 tablet by mouth 2 (two) times daily., Disp: 180 tablet, Rfl: 3   Study - [name] - olezarsen 50 mg, 80 mg or placebo SQ injection (PI-Hilty), Inject 50 mg into the skin every 28 (twenty-eight) days. For Investigational Use Only. Injection subcutaneously in protocol approved injection sites (abdomen, thigh or outer area of upper arm) every 4 weeks., Disp: , Rfl:    traMADol (ULTRAM) 50 MG tablet, TAKE 1 TABLET(50 MG) BY MOUTH EVERY 6 HOURS AS NEEDED FOR PAIN, Disp: 90 tablet, Rfl: 0   verapamil (CALAN-SR) 120 MG CR tablet, Take 1 tablet (120 mg total) by mouth daily., Disp: 90 tablet, Rfl: 2  Current Facility-Administered Medications:    Study - [name] - olezarsen 50 mg, 80 mg or placebo SQ injection (PI-Hilty), 50 mg, Subcutaneous, Once, Hilty, Nadean Corwin, MD  Physical exam: Exam limited due to telemedicine Physical Exam    Assessment and plan- Patient is a 66 y.o. male    Encounter Diagnoses  Name Primary?   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 Today we reviewed his recent labs from 02/15/2022. His hemoglobin was 17.3 and his ferritin was 75. Traditionally requires phlebotomy every 3 months but today he is in rage. We discussed holding off on  his phlebotomy for tomorrow given his labs. His last phlebotomy was on 11/17/2021. Follow up in 3 months planned.  Polycythemia- Previously felt to be secondary to elevated iron stores. Continue plan of phlebotomy.   Disposition: No phlebotomy tomorrow as previously planned RTC 3 months D1 labs (CBC, ferritin) D2 virtual visit with me, D3 Phlebotomy.    Visit Diagnosis 1. Hereditary hemochromatosis ([place])   2. Elevated hemoglobin ([place])     Patient expressed understanding and was in agreement with this plan. He also  understands  that He can call clinic at any time with any questions, concerns, or complaints.   I discussed the assessment and treatment plan with the patient. The patient was provided an opportunity to ask questions and all were answered. The patient agreed with the plan and demonstrated an understanding of the instructions.   The patient was advised to call back or seek an in-person evaluation if the symptoms worsen or if the condition fails to improve as anticipated.   I spent 15 minutes face-to-face video visit time dedicated to the care of this patient on the date of this encounter to include pre-visit review of labs and trends, face-to-face time with the patient, and post visit ordering of testing/documentation.    Thank you for allowing me to participate in the care of this very pleasant patient.   [place] [facility] Vis[facility] On Demand  CC:

## 2022-02-17 NOTE — Telephone Encounter (Signed)
Patient called and said that he can't come today to visit and wanted to see if he could do it virtual. I Lorri Frederick and she said it is ok to change. I called the pt back and let him know it will change to video but I have to get the scheduler to change it. He is ok with this

## 2022-03-02 DIAGNOSIS — Z006 Encounter for examination for normal comparison and control in clinical research program: Secondary | ICD-10-CM

## 2022-03-02 NOTE — Research (Unsigned)
Called pt to be reminded of appt on 03/03/22 at 0800. Gave parking code and reminded of NPO status. Pt verbalized understanding.

## 2022-03-03 ENCOUNTER — Other Ambulatory Visit: Payer: Self-pay

## 2022-03-03 ENCOUNTER — Encounter: Payer: Medicare (Managed Care) | Admitting: *Deleted

## 2022-03-03 ENCOUNTER — Other Ambulatory Visit: Payer: Medicare (Managed Care)

## 2022-03-03 VITALS — BP 102/89 | HR 87 | Temp 97.6°F | Resp 18

## 2022-03-03 MED ORDER — STUDY - ESSENCE - OLEZARSEN 50 MG, 80 MG OR PLACEBO SQ INJECTION (PI-HILTY)
50.0000 mg | INJECTION | Freq: Once | SUBCUTANEOUS | Status: AC
  Administered 2022-03-03: 50 mg via SUBCUTANEOUS
  Filled 2022-03-03 (×2): qty 0.5

## 2022-03-03 NOTE — Research (Addendum)
TREATMENT DAY 29 - STUDY WEEK 5    Subject Number: [id number]             Randomization [id number]           Date: 28-Sept-2023     '[x]'$ Vital Signs Collected - Blood Pressure: 102/89 - Heart Rate:87 - Respiratory Rate:18 - Temperature: 97.6 - Oxygen Saturation: 97%   '[x]'$  Extended Urinalysis   '[x]'$  Lab collection per protocol  '[x]'$  (abdominal pain only) since last visit  '[x]'$  Assessment of ER Visits, Hospitalizations, and Inpatient Days  '[x]'$  Adverse Events and Concomitant Medications  '[x]'$  Diet, Lifestyle, and Alcohol Counseling   '[x]'$  Study Drug: [place] Injection    Carlos Crosby here for Week 5 day 29 visit -[name]. He reports no abd pain, no ed or urgent care visits, and no med changes. Vs taken at 0810, blood work at [facility], and urine obtained at 0850. Injection given in left lower abd tol well. Scheduled next visit for 04-01-22 at 0800. New [name] consent signed, after allowing time to ask questions, and  read over.     [name] re- Consent     Subject Name:  Carlos Crosby   Subject met inclusion and exclusion criteria.  The informed consent form, study requirements and expectations were reviewed with the subject and questions and concerns were addressed prior to the signing of the consent form.  The subject verbalized understanding of the trial requirements.  The subject agreed to participate in the [name] trial and signed the informed consent at 0855 on 03-03-22.  The informed consent was obtained prior to performance of any protocol-specific procedures for the subject.  A copy of the signed informed consent was given to the subject and a copy was placed in the subject's medical record.   Carlos Crosby  Consent dated 02-10-22   Current Outpatient Medications:    acetaminophen (TYLENOL) 500 MG tablet, Take 1,000 mg by mouth every 6 (six) hours as needed., Disp: , Rfl:    chlorhexidine (PERIDEX) 0.12 % solution, , Disp: , Rfl:    Cholecalciferol (VITAMIN D) 2000  units CAPS, daily. , Disp: , Rfl:    digoxin (LANOXIN) 0.25 MG tablet, Take 1 tablet (0.25 mg total) by mouth daily., Disp: 90 tablet, Rfl: 2   ELIQUIS 5 MG TABS tablet, TAKE 1 TABLET TWICE DAILY, Disp: 180 tablet, Rfl: 1   furosemide (LASIX) 20 MG tablet, Take 1 tablet (20 mg total) by mouth daily as needed for edema., Disp: 90 tablet, Rfl: 3   loratadine (CLARITIN) 10 MG tablet, Take 10 mg by mouth daily., Disp: , Rfl:    metoprolol succinate (TOPROL-XL) 100 MG 24 hr tablet, TAKE 2 TABLETS EVERY MORNING  AND TAKE 1 TABLET EVERY EVENING, Disp: 270 tablet, Rfl: 3   Multiple Vitamins-Minerals (CENTRUM SILVER ADULT 50+) TABS, Take 1 tablet by mouth daily., Disp: , Rfl:    pravastatin (PRAVACHOL) 80 MG tablet, Take 1 tablet (80 mg total) by mouth every evening. PLEASE CALL TO SCHEDULE OFFICE VISIT FOR FURTHER REFILLS. THANK YOU!, Disp: 90 tablet, Rfl: 3   sacubitril-valsartan (ENTRESTO) 24-26 MG, Take 1 tablet by mouth 2 (two) times daily., Disp: 180 tablet, Rfl: 3   Study - [name] - olezarsen 50 mg, 80 mg or placebo SQ injection (PI-Hilty), Inject 50 mg into the skin every 28 (twenty-eight) days. For Investigational Use Only. Injection subcutaneously in protocol approved injection sites (abdomen, thigh or outer area of upper arm) every 4 weeks., Disp: ,  Rfl:    traMADol (ULTRAM) 50 MG tablet, TAKE 1 TABLET(50 MG) BY MOUTH EVERY 6 HOURS AS NEEDED FOR PAIN, Disp: 90 tablet, Rfl: 0   verapamil (CALAN-SR) 120 MG CR tablet, Take 1 tablet (120 mg total) by mouth daily., Disp: 90 tablet, Rfl: 2  Current Facility-Administered Medications:    Study - [name] - olezarsen 50 mg, 80 mg or placebo SQ injection (PI-Hilty), 50 mg, Subcutaneous, Once, Hilty, Nadean Corwin, MD

## 2022-03-07 NOTE — Research (Addendum)
[name] Week 5 Day 29 S922        ABNORMAL LABS: TEST RESULT  Uric acid 8.8  MCV 100.1  Urine albumin 4.12  Urine albumin creatinine ratio 44   Are these clinically significant? '[]'$  YES              '[x]'$  NO   Pixie Casino, MD, [facility], [place] Director of the Advanced Lipid Disorders &  Cardiovascular Risk Reduction Clinic Diplomate of the American Board of Clinical Lipidology Attending Cardiologist  Direct Dial: [phone]  Fax: [phone]  Website:  [email or web address]

## 2022-03-08 ENCOUNTER — Other Ambulatory Visit: Payer: Self-pay | Admitting: Family Medicine

## 2022-03-08 DIAGNOSIS — M170 Bilateral primary osteoarthritis of knee: Secondary | ICD-10-CM

## 2022-03-09 MED ORDER — TRAMADOL HCL 50 MG PO TABS: ORAL_TABLET | ORAL | 0 refills | Status: DC

## 2022-03-15 ENCOUNTER — Encounter: Payer: Self-pay | Admitting: Cardiovascular Disease

## 2022-03-15 ENCOUNTER — Encounter: Payer: Self-pay | Admitting: Oncology

## 2022-03-15 ENCOUNTER — Encounter: Payer: Self-pay | Admitting: *Deleted

## 2022-03-16 DIAGNOSIS — G4730 Sleep apnea, unspecified: Secondary | ICD-10-CM | POA: Diagnosis not present

## 2022-03-16 DIAGNOSIS — G4733 Obstructive sleep apnea (adult) (pediatric): Secondary | ICD-10-CM | POA: Diagnosis not present

## 2022-03-20 NOTE — Progress Notes (Unsigned)
Cardiology Office Note  Date:  03/21/2022   ID:  Carlos Crosby, DOB 06-04-1956, MRN [id number]  PCP:  Carlos Haven, MD   Chief Complaint  Patient presents with   6 month follow up     "Doing well." Medications reviewed by the patient verbally.     HPI:  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 who presents for routine follow-up of his atrial fibrillation  Last seen in clinic by myself 5/23 Elevated heart today 100 bpm At home runs 50 to 60 bpm Takes metoprolol succinate 200 in the morning, 100 in the evening Denies significant leg swelling, shortness of breath, chest pain  Reports that he does his exercises at home, chair exercises Part of a research study for triglycerides Weight continues to run high 355 pounds Previously got down to 310 on a special diet  Continues to do his swimming 2 days a week  Labs reviewed LDL 91  EKG personally reviewed by myself on todays visit Atrial fibrillation rate 103 bpm  old inferior MI Poor rave progression  Past medical history reviewed periodic phlebotomy for hemochromatosis Also with some polycythemia secondary to elevated iron stores  Seen by EP Dr. Caryl Comes April 2021 Permanent atrial fibrillation, rate in the 80s Walking with a dog 2 to 3 days a week Dig level check  ECHO 11/24/2015: EF 45 to 50% Stress test : 12/21/2015 No ischemia, EF 42%  echo in 05/2015: EF 30 to  35%  outpatient left lithotripsy 08/28/2014  found to have atrial fibrillation with RVR with heart rates up to 170s prior to the procedure and this was canceled. He was kept overnight in the hospital and heart rate returned to normal on his regular medication regiment, metoprolol succinate 100 mg twice a day   PMH:   has a past medical history of Chronic a-fib ([place]), Chronic systolic CHF (congestive heart failure) ([place]), Depression, Edema (09/26/2014), Hemochromatosis, History of kidney stones, History of pulmonary embolism, Hyperlipidemia, Hypertension, Migraine, Obesity, OSA (obstructive sleep apnea), Osteoarthritis, and Rocky Mountain spotted fever (2011).  PSH:    Past Surgical History:  Procedure Laterality Date   CARDIAC CATHETERIZATION  2012   [facility]   KNEE SURGERY     right knee    LITHOTRIPSY  07/31/2014   URETERAL STENT PLACEMENT  2016   URETEROSCOPY      Current Outpatient Medications  Medication Sig Dispense Refill   acetaminophen (TYLENOL) 500 MG tablet Take 1,000 mg by mouth every 6 (six) hours as needed.     Cholecalciferol (VITAMIN D) 2000 units CAPS daily.      digoxin (LANOXIN) 0.25 MG tablet Take 1 tablet (0.25 mg total) by mouth daily. 90 tablet 2   ELIQUIS 5 MG TABS tablet TAKE 1 TABLET TWICE DAILY 180 tablet 1   furosemide (LASIX) 20 MG tablet Take 1 tablet (20 mg total) by mouth daily as needed for edema. 90 tablet 3   loratadine (CLARITIN) 10 MG tablet Take 10 mg by  mouth daily.     metoprolol succinate (TOPROL-XL) 100 MG 24 hr tablet TAKE 2 TABLETS EVERY MORNING  AND TAKE 1 TABLET EVERY EVENING 270 tablet 3   Multiple Vitamins-Minerals (CENTRUM SILVER ADULT 50+) TABS Take 1 tablet by mouth daily.     pravastatin (PRAVACHOL) 80 MG tablet Take 1 tablet (80 mg total) by mouth every evening. PLEASE CALL TO SCHEDULE OFFICE VISIT FOR FURTHER REFILLS. THANK YOU! 90 tablet 3   sacubitril-valsartan (ENTRESTO) 24-26 MG Take 1 tablet by mouth 2 (two) times daily. 180 tablet 3    traMADol (ULTRAM) 50 MG tablet TAKE 1 TABLET(50 MG) BY MOUTH EVERY 6 HOURS AS NEEDED FOR PAIN 90 tablet 0   verapamil (CALAN-SR) 120 MG CR tablet Take 1 tablet (120 mg total) by mouth daily. 90 tablet 2   chlorhexidine (PERIDEX) 0.12 % solution  (Patient not taking: Reported on 01/06/2022)     Study - [name] - olezarsen 50 mg, 80 mg or placebo SQ injection (PI-Hilty) Inject 50 mg into the skin every 28 (twenty-eight) days. For Investigational Use Only. Injection subcutaneously in protocol approved injection sites (abdomen, thigh or outer area of upper arm) every 4 weeks. (Patient not taking: Reported on 03/21/2022)     Current Facility-Administered Medications  Medication Dose Route Frequency Provider Last Rate Last Admin   Study - [name] - olezarsen 50 mg, 80 mg or placebo SQ injection (PI-Hilty)  50 mg Subcutaneous Once Hilty, Nadean Corwin, MD         Allergies:   Allopurinol, Diltiazem, Chlorhexidine, Peridex [chlorhexidine gluconate], Ondansetron, and Penicillins   Social History:  The patient  reports that he has never smoked. He has never used smokeless tobacco. He reports that he does not drink alcohol and does not use drugs.   Family History:   family history includes Arthritis in his father; Cancer in his father and paternal grandfather; Hypertension in his father; Thyroid disease in his mother.   Review of Systems: Review of Systems  Constitutional: Negative.   HENT: Negative.    Respiratory: Negative.    Cardiovascular: Negative.   Gastrointestinal: Negative.   Musculoskeletal:  Positive for joint pain.  Neurological: Negative.   Psychiatric/Behavioral: Negative.    All other systems reviewed and are negative.  PHYSICAL EXAM: VS:  BP 122/78 (BP Location: Left Arm, Patient Position: Sitting, Cuff Size: Large)   Pulse (!) 103   Ht '5\' 11"'$  (1.803 m)   Wt (!) 355 lb 2 oz (161.1 kg)   SpO2 97%   BMI 49.53 kg/m  , BMI Body mass index is 49.53 kg/m. Constitutional:  oriented  to person, place, and time. No distress.  HENT:  Head: Grossly normal Eyes:  no discharge. No scleral icterus.  Neck: No JVD, no carotid bruits  Cardiovascular: irreg irreg, no murmurs appreciated Pulmonary/Chest: Clear to auscultation bilaterally, no wheezes or rails Abdominal: Soft.  no distension.  no tenderness.  Musculoskeletal: Normal range of motion Neurological:  normal muscle tone. Coordination normal. No atrophy Skin: Skin warm and dry Psychiatric: normal affect, pleasant  Recent Labs: 07/12/2021: ALT 15 07/20/2021: BUN 17; Creatinine, Ser 1.01; Potassium 4.7; Sodium 137 02/15/2022: Hemoglobin 17.3; Platelets 199    Lipid Panel Lab Results  Component Value Date   CHOL 155 03/29/2021   HDL 41.10 03/29/2021   LDLCALC 77 02/25/2020   TRIG 201.0 (H) 03/29/2021    Wt Readings from Last 3 Encounters:  03/21/22 (!) 355 lb 2 oz (161.1 kg)  02/03/22 (!) 352 lb (159.7  kg)  01/06/22 (!) 352 lb 12.8 oz (160 kg)     ASSESSMENT AND PLAN:  Permanent atrial fibrillation (HCC) Elevated rate in the office today walking in, improved rate at home and on recheck at rest in the office Rate controlled, on eliquis No changes made, continue metoprolol and verapamil  Essential hypertension Blood pressure is well controlled on today's visit. No changes made to the medications.  Diastolic CHF Lasix 20 mg as needed for leg swelling, takes sparingly Appears euvolemic today  Pure hypercholesterolemia Stressed importance of exercise program, low carbohydrate diet  Morbid obesity (HCC) Weight remains elevated but stable Had lower weight through progressive diet modification in the past  Anxiety Needs Xanax before procedures  Obstructive sleep apnea on CPAP  compliant on his CPAP  Chronic diastolic congestive heart failure (HCC) EF 50 to 55% On Entresto and metoprolol succinate,  Lasix as needed (rare) Blood pressure stable    Total encounter time more than 30 minutes  Greater  than 50% was spent in counseling and coordination of care with the patient     Signed, Esmond Plants, M.D., Ph.D. 03/21/2022  [place], [place]

## 2022-03-21 ENCOUNTER — Encounter: Payer: Self-pay | Admitting: Cardiovascular Disease

## 2022-03-21 ENCOUNTER — Ambulatory Visit
Admission: RE | Admit: 2022-03-21 | Discharge: 2022-03-21 | Disposition: A | Discharge status: Home / Self Care | Payer: Medicare (Managed Care) | Source: Ambulatory Visit | Attending: Urology | Admitting: Urology

## 2022-03-21 ENCOUNTER — Telehealth: Payer: Self-pay

## 2022-03-21 ENCOUNTER — Ambulatory Visit (INDEPENDENT_AMBULATORY_CARE_PROVIDER_SITE_OTHER): Payer: Medicare (Managed Care) | Admitting: Cardiovascular Disease

## 2022-03-21 ENCOUNTER — Ambulatory Visit
Admission: RE | Admit: 2022-03-21 | Discharge: 2022-03-21 | Disposition: A | Discharge status: Home / Self Care | Payer: Medicare (Managed Care) | Attending: Urology | Admitting: Urology

## 2022-03-21 VITALS — BP 122/78 | HR 103 | Ht 71.0 in | Wt 355.1 lb

## 2022-03-21 DIAGNOSIS — I4891 Unspecified atrial fibrillation: Secondary | ICD-10-CM | POA: Insufficient documentation

## 2022-03-21 DIAGNOSIS — I10 Essential (primary) hypertension: Secondary | ICD-10-CM | POA: Insufficient documentation

## 2022-03-21 DIAGNOSIS — I428 Other cardiomyopathies: Secondary | ICD-10-CM | POA: Diagnosis not present

## 2022-03-21 DIAGNOSIS — G4733 Obstructive sleep apnea (adult) (pediatric): Secondary | ICD-10-CM | POA: Insufficient documentation

## 2022-03-21 DIAGNOSIS — I5032 Chronic diastolic (congestive) heart failure: Secondary | ICD-10-CM | POA: Insufficient documentation

## 2022-03-21 DIAGNOSIS — I5042 Chronic combined systolic (congestive) and diastolic (congestive) heart failure: Secondary | ICD-10-CM

## 2022-03-21 DIAGNOSIS — N200 Calculus of kidney: Secondary | ICD-10-CM | POA: Diagnosis not present

## 2022-03-21 DIAGNOSIS — I878 Other specified disorders of veins: Secondary | ICD-10-CM | POA: Diagnosis not present

## 2022-03-21 DIAGNOSIS — E782 Mixed hyperlipidemia: Secondary | ICD-10-CM | POA: Diagnosis not present

## 2022-03-21 DIAGNOSIS — I4821 Permanent atrial fibrillation: Secondary | ICD-10-CM | POA: Diagnosis not present

## 2022-03-21 MED ORDER — APIXABAN 5 MG PO TABS: 5.0000 mg | ORAL_TABLET | Freq: Two times a day (BID) | ORAL | 1 refills | Status: DC

## 2022-03-21 NOTE — Telephone Encounter (Signed)
Prescription refill request for Eliquis received. Indication: AF Last office visit: 03/21/22  Johnny Bridge MD Scr: 1.01 on 07/20/21 Age: 66 Weight: 161.1kg  Based on above findings Eliquis '5mg'$  twice daily is the appropriate dose.  Refill approved.

## 2022-03-21 NOTE — Patient Instructions (Signed)
Medication Instructions:  No changes  If you need a refill on your cardiac medications before your next appointment, please call your pharmacy.   Lab work: No new labs needed  Testing/Procedures: No new testing needed  Follow-Up: At [facility], you and your health needs are our priority.  As part of our continuing mission to provide you with exceptional heart care, we have created designated Provider Care Teams.  These Care Teams include your primary Cardiologist (physician) and Advanced Practice Providers (APPs -  Physician Assistants and Nurse Practitioners) who all work together to provide you with the care you need, when you need it.  You will need a follow up appointment in 12 months  Providers on your designated Care Team:   Christopher Berge, NP Ryan Dunn, PA-C Cadence Furth, PA-C  COVID-19 Vaccine Information can be found at: [email or web address] For questions related to vaccine distribution or appointments, please email [email or web address] or call [phone].   

## 2022-03-21 NOTE — Telephone Encounter (Signed)
*  STAT* If patient is at the pharmacy, call can be transferred to refill team.   1. Which medications need to be refilled? (please list name of each medication and dose if known) Eliquis  2. Which pharmacy/location (including street and city if local pharmacy) is medication to be sent to?[facility]  3. Do they need a 30 day or 90 day supply? [place]

## 2022-03-21 NOTE — Research (Addendum)
Carlos Crosby Carlos Crosby 28-Sept-2023

## 2022-03-23 ENCOUNTER — Ambulatory Visit (INDEPENDENT_AMBULATORY_CARE_PROVIDER_SITE_OTHER): Payer: Medicare (Managed Care) | Admitting: Urology

## 2022-03-23 ENCOUNTER — Encounter: Payer: Self-pay | Admitting: Urology

## 2022-03-23 VITALS — Ht 71.0 in | Wt 346.0 lb

## 2022-03-23 DIAGNOSIS — N200 Calculus of kidney: Secondary | ICD-10-CM | POA: Diagnosis not present

## 2022-03-23 NOTE — Progress Notes (Signed)
03/23/2022 9:41 AM   Carlos Crosby November 11, 1955 [id number]  Referring provider: Leone Haven, MD [place],  [place] [address]  Chief Complaint  Patient presents with   Nephrolithiasis    HPI: 66 year old male with a personal history of nephrolithiasis returns today for annual follow-up with KUB.  He has undergone multiple stone procedures in the past including PCNL, ureteroscopy and shockwave.  KUB today is unchanged from 2022, he has a complaints of 9 mm calcification in the left lower pole, otherwise no additional new or increased stone burden.  Previous stone analysis from 2016 showed 79% calcium oxalate monohydrate, 10% calcium oxalate dihydrate, 15% calcium phosphate.   He underwent 67-RFFM urine metabolic work-up which was fairly unremarkable.   He is doing well.  He denies any flank pain or interval stone episodes.  No dysuria or gross hematuria.  He continues to drink only water and a fair amount of it.  He is to balance this with fluid restrictions for his CHF/A-fib.  He prefers to avoid anesthesia as much as possible.   PMH: Past Medical History:  Diagnosis Date   Chronic a-fib (HCC)    Chronic systolic CHF (congestive heart failure) ([place])    Depression    Edema 09/26/2014   Hemochromatosis    History of kidney stones    History of pulmonary embolism    Hyperlipidemia    Hypertension    Migraine    Obesity    OSA (obstructive sleep apnea)    on CPAP   Osteoarthritis    bilateral knees   Rocky Mountain spotted fever 2011    Surgical History: Past Surgical History:  Procedure Laterality Date   CARDIAC CATHETERIZATION  2012   [facility]   KNEE SURGERY     right knee    LITHOTRIPSY  07/31/2014   URETERAL STENT PLACEMENT  2016   URETEROSCOPY      Home Medications:  Allergies as of 03/23/2022       Reactions   Allopurinol Hives, Swelling   Diltiazem Itching, Rash   Chlorhexidine     lump in throat    Peridex [chlorhexidine  Gluconate]    Mouth swelling  Blisters    Ondansetron Rash   Penicillins Other (See Comments), Nausea Only   Pt unsure of rxn; showed up on allergy test Pt unsure of rxn; showed up on allergy test Pt unsure of rxn; showed up on allergy test And dirrhea        Medication List        Accurate as of March 23, 2022  9:41 AM. If you have any questions, ask your nurse or doctor.          STOP taking these medications    chlorhexidine 0.12 % solution Commonly known as: PERIDEX Stopped by: Hollice Espy, MD   [name] olezarsen or placebo 80 mg/0.8 mL SQ injection Stopped by: Hollice Espy, MD       TAKE these medications    acetaminophen 500 MG tablet Commonly known as: TYLENOL Take 1,000 mg by mouth every 6 (six) hours as needed.   apixaban 5 MG Tabs tablet Commonly known as: Eliquis Take 1 tablet (5 mg total) by mouth 2 (two) times daily.   Centrum Silver Adult 50+ Tabs Take 1 tablet by mouth daily.   digoxin 0.25 MG tablet Commonly known as: LANOXIN Take 1 tablet (0.25 mg total) by mouth daily.   Entresto 24-26 MG Generic drug: sacubitril-valsartan Take 1 tablet  by mouth 2 (two) times daily.   furosemide 20 MG tablet Commonly known as: LASIX Take 1 tablet (20 mg total) by mouth daily as needed for edema.   loratadine 10 MG tablet Commonly known as: CLARITIN Take 10 mg by mouth daily.   metoprolol succinate 100 MG 24 hr tablet Commonly known as: TOPROL-XL TAKE 2 TABLETS EVERY MORNING  AND TAKE 1 TABLET EVERY EVENING   pravastatin 80 MG tablet Commonly known as: PRAVACHOL Take 1 tablet (80 mg total) by mouth every evening. PLEASE CALL TO SCHEDULE OFFICE VISIT FOR FURTHER REFILLS. THANK YOU!   traMADol 50 MG tablet Commonly known as: ULTRAM TAKE 1 TABLET(50 MG) BY MOUTH EVERY 6 HOURS AS NEEDED FOR PAIN   verapamil 120 MG CR tablet Commonly known as: CALAN-SR Take 1 tablet (120 mg total) by mouth daily.   Vitamin D 50 MCG (2000 UT)  Caps daily.        Allergies:  Allergies  Allergen Reactions   Allopurinol Hives and Swelling   Diltiazem Itching and Rash   Chlorhexidine      lump in throat    Peridex [Chlorhexidine Gluconate]     Mouth swelling  Blisters    Ondansetron Rash   Penicillins Other (See Comments) and Nausea Only    Pt unsure of rxn; showed up on allergy test Pt unsure of rxn; showed up on allergy test Pt unsure of rxn; showed up on allergy test And dirrhea     Family History: Family History  Problem Relation Age of Onset   Thyroid disease Mother    Cancer Father        lung   Arthritis Father    Hypertension Father    Cancer Paternal Grandfather        Throat cancer    Social History:  reports that he has never smoked. He has never used smokeless tobacco. He reports that he does not drink alcohol and does not use drugs.   Physical Exam: Ht '5\' 11"'$  (1.803 m)   Wt (!) 346 lb (156.9 kg)   BMI 48.26 kg/m   Constitutional:  Alert and oriented, No acute distress. HEENT: [place] AT, moist mucus membranes.  Trachea midline, no masses. Cardiovascular: No clubbing, cyanosis, or edema. Skin: No rashes, bruises or suspicious lesions. Neurologic: Grossly intact, no focal deficits, moving all 4 extremities. Psychiatric: Normal mood and affect.  Laboratory Data: Lab Results  Component Value Date   WBC 7.3 02/15/2022   HGB 17.3 (H) 02/15/2022   HCT 49.7 02/15/2022   MCV 96.7 02/15/2022   PLT 199 02/15/2022    Lab Results  Component Value Date   CREATININE 1.01 07/20/2021    Lab Results  Component Value Date   HGBA1C 5.4 03/29/2021   Imaging: CLINICAL DATA:  Left-sided kidney stones for 4 years   EXAM: ABDOMEN - 1 VIEW   COMPARISON:  Radiographs 03/16/2021   FINDINGS: Unchanged calcifications over the left renal shadow with the largest measuring 9 mm. No definite right urolithiasis. Unremarkable bowel gas pattern. Pelvic phleboliths. No acute osseous abnormality.    IMPRESSION: Left nephrolithiasis measuring up to 9 mm.     Electronically Signed   By: Placido Sou M.D.   On: 03/21/2022 21:38  KUB personally reviewed, compared to last year it is unchanged.    Assessment & Plan:    1. Kidney stone on left side Unchanged and asymptomatic   Recommend continued observation especially in light of his medical comorbities  Continue to  encourage hydration   Return for KUB in 2 years (please place on recall list).  Hollice Espy, MD  [facility] [address], [place], [place] [address] [phone]

## 2022-03-25 ENCOUNTER — Telehealth: Payer: Self-pay

## 2022-03-25 NOTE — Telephone Encounter (Signed)
Per fax received from [facility] RE: tier request exception to lower cost of Eliquis: After a review of your Plan's list of covered drugs, we have found no other Brand drugs, appropriate to treat your diagnosis or condition, on a lower cost-sharing tier. Since the drug you are requesting is already being provided to you at the lowest cost-share amount allowed by your plan, your request can not be approved under your Part D benefit.

## 2022-03-31 ENCOUNTER — Encounter: Payer: Self-pay | Admitting: *Deleted

## 2022-03-31 DIAGNOSIS — Z006 Encounter for examination for normal comparison and control in clinical research program: Secondary | ICD-10-CM

## 2022-03-31 NOTE — Telephone Encounter (Signed)
Spoke with patient and reviewed that the tier exception request was denied. He verbalized understanding and had no further needs at this time. Instructed him to call back if needed.

## 2022-03-31 NOTE — Research (Signed)
Message left  for Carlos Crosby to remind him of his appointment tomorrow.

## 2022-04-01 ENCOUNTER — Ambulatory Visit (INDEPENDENT_AMBULATORY_CARE_PROVIDER_SITE_OTHER): Payer: Medicare (Managed Care) | Admitting: Family Medicine

## 2022-04-01 ENCOUNTER — Encounter: Payer: Self-pay | Admitting: Family Medicine

## 2022-04-01 ENCOUNTER — Encounter: Payer: Medicare (Managed Care) | Admitting: *Deleted

## 2022-04-01 ENCOUNTER — Other Ambulatory Visit: Payer: Self-pay

## 2022-04-01 VITALS — BP 109/50 | HR 68 | Temp 97.3°F | Resp 18

## 2022-04-01 DIAGNOSIS — G4733 Obstructive sleep apnea (adult) (pediatric): Secondary | ICD-10-CM | POA: Diagnosis not present

## 2022-04-01 DIAGNOSIS — I4821 Permanent atrial fibrillation: Secondary | ICD-10-CM

## 2022-04-01 DIAGNOSIS — M170 Bilateral primary osteoarthritis of knee: Secondary | ICD-10-CM | POA: Diagnosis not present

## 2022-04-01 DIAGNOSIS — E782 Mixed hyperlipidemia: Secondary | ICD-10-CM | POA: Diagnosis not present

## 2022-04-01 DIAGNOSIS — I5032 Chronic diastolic (congestive) heart failure: Secondary | ICD-10-CM

## 2022-04-01 DIAGNOSIS — Z006 Encounter for examination for normal comparison and control in clinical research program: Secondary | ICD-10-CM

## 2022-04-01 MED ORDER — TRAMADOL HCL 50 MG PO TABS: ORAL_TABLET | ORAL | 0 refills | Status: DC

## 2022-04-01 MED ORDER — STUDY - ESSENCE - OLEZARSEN 50 MG, 80 MG OR PLACEBO SQ INJECTION (PI-HILTY)
50.0000 mg | INJECTION | SUBCUTANEOUS | Status: DC
  Administered 2022-04-01 – 2022-05-02 (×2): 50 mg via SUBCUTANEOUS
  Filled 2022-04-01: qty 0.5
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
## 2022-04-01 NOTE — Assessment & Plan Note (Signed)
He is currently enrolled in the study for his triglycerides.

## 2022-04-01 NOTE — Assessment & Plan Note (Signed)
Symptomatically well-controlled with CPAP.  He will continue his CPAP.  We will request a compliance report.

## 2022-04-01 NOTE — Assessment & Plan Note (Signed)
Appears to be euvolemic.  He can continue Lasix 20 mg daily as needed for swelling and Entresto 1 tablet twice daily.

## 2022-04-01 NOTE — Research (Addendum)
Carlos Crosby [name] Week 9 day 57 01-Apr-2022     TREATMENT DAY 57 - STUDY WEEK 9    Subject Number: [id number]           Randomization Number: [id number]         Date:01-Apr-2022      '[x]'$ Vital Signs Collected - Blood Pressure:109/50 - Heart Rate:68 - Respiratory Rate:18 - Temperature:97.3 - Oxygen Saturation:96%  '[x]'$  Extended Urinalysis   '[x]'$  Lab collection per protocol  '[x]'$  (abdominal pain only) since last visit  '[x]'$  Assessment of ER Visits, Hospitalizations, and Inpatient Days  '[x]'$  Adverse Events and Concomitant Medications  '[x]'$  Diet, Lifestyle, and Alcohol Counseling   '[x]'$  Study Drug: [place] Injection    Carlos Crosby is here for [name] Week 9 Day 57. He reports no changes in his medications, no abd pain, and no visits to the ED or Urgent care since last seen. VS taken at 0806, Blood drawn at 0815, and urine obtained at 0852. Injection given in right lower abd at [place] well. Next appointment scheduled for Nov 27 at 0900.  Current Outpatient Medications:    acetaminophen (TYLENOL) 500 MG tablet, Take 1,000 mg by mouth every 6 (six) hours as needed., Disp: , Rfl:    apixaban (ELIQUIS) 5 MG TABS tablet, Take 1 tablet (5 mg total) by mouth 2 (two) times daily., Disp: 180 tablet, Rfl: 1   Cholecalciferol (VITAMIN D) 2000 units CAPS, daily. , Disp: , Rfl:    digoxin (LANOXIN) 0.25 MG tablet, Take 1 tablet (0.25 mg total) by mouth daily., Disp: 90 tablet, Rfl: 2   furosemide (LASIX) 20 MG tablet, Take 1 tablet (20 mg total) by mouth daily as needed for edema., Disp: 90 tablet, Rfl: 3   loratadine (CLARITIN) 10 MG tablet, Take 10 mg by mouth daily., Disp: , Rfl:    metoprolol succinate (TOPROL-XL) 100 MG 24 hr tablet, TAKE 2 TABLETS EVERY MORNING  AND TAKE 1 TABLET EVERY EVENING, Disp: 270 tablet, Rfl: 3   Multiple Vitamins-Minerals (CENTRUM SILVER ADULT 50+) TABS, Take 1 tablet by mouth daily., Disp: , Rfl:    pravastatin (PRAVACHOL) 80 MG tablet, Take 1 tablet (80 mg total) by mouth  every evening. PLEASE CALL TO SCHEDULE OFFICE VISIT FOR FURTHER REFILLS. THANK YOU!, Disp: 90 tablet, Rfl: 3   sacubitril-valsartan (ENTRESTO) 24-26 MG, Take 1 tablet by mouth 2 (two) times daily., Disp: 180 tablet, Rfl: 3   traMADol (ULTRAM) 50 MG tablet, TAKE 1 TABLET(50 MG) BY MOUTH EVERY 6 HOURS AS NEEDED FOR PAIN, Disp: 90 tablet, Rfl: 0   verapamil (CALAN-SR) 120 MG CR tablet, Take 1 tablet (120 mg total) by mouth daily., Disp: 90 tablet, Rfl: 2  Current Facility-Administered Medications:    Study - [name] - olezarsen 50 mg, 80 mg or placebo SQ injection (PI-Hilty), 50 mg, Subcutaneous, Once, Hilty, Nadean Corwin, MD   Study - [name] - olezarsen 50 mg, 80 mg or placebo SQ injection (PI-Hilty), 50 mg, Subcutaneous, Q28 days, Hilty, Nadean Corwin, MD

## 2022-04-01 NOTE — Addendum Note (Signed)
Addended by: Leone Haven on: 04/01/2022 03:03 PM   Modules accepted: Orders

## 2022-04-01 NOTE — Assessment & Plan Note (Signed)
Rate controlled.  He will continue verapamil 120 mg daily, metoprolol 200 mg in the morning and 100 mg at night, digoxin managed by cardiology, and Eliquis 5 mg twice daily.

## 2022-04-04 NOTE — Research (Cosign Needed)
Loney Loh Week 9 day 57 01-Apr-2022        Chemistry: BUN 27 mg/dL                                  '[]'$ Clinically Significant  '[x]'$ Not Clinically Significant  Glucose  167 mg/dL                          '[]'$ Clinically Significant  '[x]'$ Not Clinically Significant   Hematology: MCV 99.9 fL                                      '[]'$ Clinically Significant  '[x]'$ Not Clinically Significant      Any further action needed to be taken per the PI? No  Pixie Casino, MD, [facility], [place] Director of the Advanced Lipid Disorders &  Cardiovascular Risk Reduction Clinic Diplomate of the American Board of Clinical Lipidology Attending Cardiologist  Direct Dial: [phone]  Fax: [phone]  Website:  [email or web address]

## 2022-04-05 NOTE — Research (Addendum)
Carlos Crosby 01-Apr-2022  Albumin Creatinine Ratio  21 mg/g    '[]'$ Clinically Significant  '[x]'$ Not Clinically Significant   Any further action needed to be taken per the PI?  No  Pixie Casino, MD, [facility], [place] Director of the Advanced Lipid Disorders &  Cardiovascular Risk Reduction Clinic Diplomate of the American Board of Clinical Lipidology Attending Cardiologist  Direct Dial: [phone]  Fax: [phone]  Website:  [email or web address]

## 2022-04-07 ENCOUNTER — Other Ambulatory Visit: Payer: Self-pay | Admitting: *Deleted

## 2022-04-07 MED ORDER — ENTRESTO 24-26 MG PO TABS: 1.0000 | ORAL_TABLET | Freq: Two times a day (BID) | ORAL | 3 refills | Status: DC

## 2022-04-08 ENCOUNTER — Other Ambulatory Visit: Payer: Self-pay

## 2022-04-19 ENCOUNTER — Encounter: Payer: Self-pay | Admitting: Urology

## 2022-04-19 DIAGNOSIS — M549 Dorsalgia, unspecified: Secondary | ICD-10-CM

## 2022-04-20 ENCOUNTER — Ambulatory Visit
Admission: RE | Admit: 2022-04-20 | Discharge: 2022-04-20 | Disposition: A | Discharge status: Home / Self Care | Payer: Medicare (Managed Care) | Attending: Urology | Admitting: Urology

## 2022-04-20 ENCOUNTER — Other Ambulatory Visit: Payer: Self-pay | Admitting: Cardiovascular Disease

## 2022-04-20 ENCOUNTER — Ambulatory Visit
Admission: RE | Admit: 2022-04-20 | Discharge: 2022-04-20 | Disposition: A | Discharge status: Home / Self Care | Payer: Medicare (Managed Care) | Source: Ambulatory Visit | Attending: Urology | Admitting: Urology

## 2022-04-20 DIAGNOSIS — M549 Dorsalgia, unspecified: Secondary | ICD-10-CM | POA: Diagnosis not present

## 2022-04-20 DIAGNOSIS — R109 Unspecified abdominal pain: Secondary | ICD-10-CM | POA: Diagnosis not present

## 2022-04-27 ENCOUNTER — Encounter: Payer: Self-pay | Admitting: *Deleted

## 2022-04-27 DIAGNOSIS — Z006 Encounter for examination for normal comparison and control in clinical research program: Secondary | ICD-10-CM

## 2022-04-27 NOTE — Research (Signed)
Spoke with Mr Carlos Crosby to remind him of his appointment Monday at 0900. Gave parking code. Voices understanding.

## 2022-05-02 ENCOUNTER — Other Ambulatory Visit: Payer: Self-pay

## 2022-05-02 ENCOUNTER — Encounter: Payer: Medicare (Managed Care) | Admitting: *Deleted

## 2022-05-02 DIAGNOSIS — Z006 Encounter for examination for normal comparison and control in clinical research program: Secondary | ICD-10-CM

## 2022-05-02 MED ORDER — STUDY - ESSENCE - OLEZARSEN 50 MG, 80 MG OR PLACEBO SQ INJECTION (PI-HILTY)
50.0000 mg | INJECTION | SUBCUTANEOUS | Status: DC
  Filled 2022-05-02: qty 0.5

## 2022-05-02 NOTE — Research (Addendum)
Carlos Crosby Week 13 Day 85 [name] 02-May-2022                 Hematology: MCV 98.8 fL                                         '[]'$ Clinically Significant  '[x]'$ Not Clinically Significant  Coagulation  Prothrombin Time (PT) 14.7 Sec          '[]'$ Clinically Significant  '[x]'$ Not Clinically Significant   Urine Chemistry: Protein Creatinine Ratio 230 mg/g            '[]'$ Clinically Significant  '[x]'$ Not Clinically Significant Urine Albumin 6.74 mg/dL                         '[]'$ Clinically Significant  '[x]'$ Not Clinically Significant Albumin Creatinine Ratio 141 mg/g           '[]'$ Clinically Significant  '[x]'$ Not Clinically Significant     Any further action needed to be taken per the PI?  No  Pixie Casino, MD, [facility], [place] Director of the Advanced Lipid Disorders &  Cardiovascular Risk Reduction Clinic Diplomate of the American Board of Clinical Lipidology Attending Cardiologist  Direct Dial: [phone]  Fax: [phone]  Website:  [email or web address]    TREATMENT DAY 85 - STUDY WEEK 13    Subject Number: [id number]             Randomization Number:[id number]           Date:02-May-2022      '[x]'$ Vital Signs Collected - Blood Pressure:114/87 - Heart Rate:74 - Respiratory Rate:20 - Temperature:97.1 - Oxygen Saturation:96%  '[x]'$  Extended Urinalysis   '[x]'$  Lab collection per protocol  '[x]'$   (abdominal pain only) since last visit  '[x]'$  Assessment of ER Visits, Hospitalizations, and Inpatient Days  '[x]'$  Adverse Events and Concomitant Medications  '[x]'$  Diet, Lifestyle, and Alcohol Counseling   '[x]'$  Study Drug: [place] Injection   Carlos Crosby is here for Week 13 Day 85 of [name] research. He reports he feels good, no abd pain, no visits to the ED or urgent care. Vs taken at 0850, Blood drawn at 0903, and urine obtained at 0912. Injection given in right lower abd at 0934. Tol well. Next visit scheduled for Dec 28 at 0900. No changes in his  medications.    Current Outpatient Medications:    acetaminophen (TYLENOL) 500 MG tablet, Take 1,000 mg by mouth every 6 (six) hours as needed., Disp: , Rfl:    apixaban (ELIQUIS) 5 MG TABS tablet, Take 1 tablet (5 mg total) by mouth 2 (two) times daily., Disp: 180 tablet, Rfl: 1   Cholecalciferol (VITAMIN D) 2000 units CAPS, daily. , Disp: , Rfl:    digoxin (LANOXIN) 0.25 MG tablet, Take 1 tablet (0.25 mg total) by mouth daily., Disp: 90 tablet, Rfl: 2   furosemide (LASIX) 20 MG tablet, Take 1 tablet (20 mg total) by mouth daily as needed for edema., Disp: 90 tablet, Rfl: 3   loratadine (CLARITIN) 10 MG tablet, Take 10 mg by mouth daily., Disp: , Rfl:    metoprolol succinate (TOPROL-XL) 100 MG 24 hr tablet, TAKE 2 TABLETS EVERY MORNING  AND TAKE 1 TABLET EVERY EVENING, Disp: 270 tablet, Rfl: 3   Multiple Vitamins-Minerals (CENTRUM SILVER ADULT 50+) TABS, Take 1 tablet by mouth daily., Disp: , Rfl:  pravastatin (PRAVACHOL) 80 MG tablet, Take 1 tablet (80 mg total) by mouth every evening. PLEASE CALL TO SCHEDULE OFFICE VISIT FOR FURTHER REFILLS. THANK YOU!, Disp: 90 tablet, Rfl: 3   sacubitril-valsartan (ENTRESTO) 24-26 MG, Take 1 tablet by mouth 2 (two) times daily., Disp: 180 tablet, Rfl: 3   traMADol (ULTRAM) 50 MG tablet, TAKE 1 TABLET(50 MG) BY MOUTH EVERY 6 HOURS AS NEEDED FOR PAIN, Disp: 90 tablet, Rfl: 0   verapamil (CALAN-SR) 120 MG CR tablet, TAKE 1 TABLET DAILY, Disp: 90 tablet, Rfl: 2  Current Facility-Administered Medications:    Study - [name] - olezarsen 50 mg, 80 mg or placebo SQ injection (PI-Hilty), 50 mg, Subcutaneous, Q28 days, Hilty, Nadean Corwin, MD

## 2022-05-03 ENCOUNTER — Encounter: Payer: Self-pay | Admitting: *Deleted

## 2022-05-03 DIAGNOSIS — Z006 Encounter for examination for normal comparison and control in clinical research program: Secondary | ICD-10-CM

## 2022-05-03 NOTE — Research (Signed)
Carlos Crosby called to report a red  rash like area  in a circle around at the  research drug injection site. He reports that he applied cortisone cream to the area and now its better.

## 2022-05-11 ENCOUNTER — Other Ambulatory Visit: Payer: Self-pay

## 2022-05-11 ENCOUNTER — Inpatient Hospital Stay: Payer: Medicare (Managed Care) | Attending: Nurse Practitioner

## 2022-05-11 DIAGNOSIS — D751 Secondary polycythemia: Secondary | ICD-10-CM | POA: Insufficient documentation

## 2022-05-11 LAB — CBC WITH DIFFERENTIAL/PLATELET
Abs Immature Granulocytes: 0.04 10*3/uL (ref 0.00–0.07)
Basophils Absolute: 0.1 10*3/uL (ref 0.0–0.1)
Basophils Relative: 1 %
Eosinophils Absolute: 0.2 10*3/uL (ref 0.0–0.5)
Eosinophils Relative: 3 %
HCT: 50.2 % (ref 39.0–52.0)
Hemoglobin: 17.7 g/dL — ABNORMAL HIGH (ref 13.0–17.0)
Immature Granulocytes: 1 %
Lymphocytes Relative: 34 %
Lymphs Abs: 2.7 10*3/uL (ref 0.7–4.0)
MCH: 33.7 pg (ref 26.0–34.0)
MCHC: 35.3 g/dL (ref 30.0–36.0)
MCV: 95.6 fL (ref 80.0–100.0)
Monocytes Absolute: 0.7 10*3/uL (ref 0.1–1.0)
Monocytes Relative: 9 %
Neutro Abs: 4.3 10*3/uL (ref 1.7–7.7)
Neutrophils Relative %: 52 %
Platelets: 198 10*3/uL (ref 150–400)
RBC: 5.25 MIL/uL (ref 4.22–5.81)
RDW: 13.1 % (ref 11.5–15.5)
WBC: 8 10*3/uL (ref 4.0–10.5)
nRBC: 0 % (ref 0.0–0.2)

## 2022-05-11 LAB — FERRITIN: Ferritin: 107 ng/mL (ref 24–336)

## 2022-05-12 ENCOUNTER — Encounter: Payer: Self-pay | Admitting: Nurse Practitioner

## 2022-05-12 ENCOUNTER — Inpatient Hospital Stay (HOSPITAL_BASED_OUTPATIENT_CLINIC_OR_DEPARTMENT_OTHER): Payer: Medicare (Managed Care) | Admitting: Nurse Practitioner

## 2022-05-12 DIAGNOSIS — D751 Secondary polycythemia: Secondary | ICD-10-CM | POA: Diagnosis not present

## 2022-05-12 NOTE — Progress Notes (Signed)
Virtual Visit Progress Note  I connected with Carlos Crosby on 05/12/22 at  2:30 PM EST by video enabled telemedicine visit and verified that I am speaking with the correct person using two identifiers.   I discussed the limitations, risks, security and privacy concerns of performing an evaluation and management service by telemedicine and the availability of in-person appointments. I also discussed with the patient that there may be a patient responsible charge related to this service. The patient expressed understanding and agreed to proceed.   Other persons participating in the visit and their role in the encounter: None   Patient's location: home Provider's location: Clinic   Chief Complaint: Hemochromatosis     Patient Care Team: Leone Haven, MD as PCP - General (Family Medicine) Minna Merritts, MD as PCP - Cardiology (Cardiology) Deboraha Sprang, MD as PCP - Electrophysiology (Cardiology) Minna Merritts, MD as Consulting Physician (Cardiology)   Name of the patient: Carlos Crosby  [id number]  01-26-1956   Date of visit: 05/12/22  History of Presenting Illness- Patient is 66 year old male who agrees to evaluation via telemedicine for follow up for hemochromatosis. He continues research study for his triglycerides. Otherwise feels at baseline. Has not had phlebotomy in several months. Reports compliance with cpap. Not smoker. Otherwise feels well and denies complaints.   Review of systems- Review of Systems  Constitutional:  Negative for chills, diaphoresis, fever, malaise/fatigue and weight loss.  Respiratory:  Negative for shortness of breath.   Cardiovascular:  Negative for chest pain.  Gastrointestinal:  Negative for abdominal pain, constipation, diarrhea, nausea and vomiting.  Skin:  Negative for rash.  Neurological:  Negative for dizziness, loss of consciousness, weakness and headaches.  Psychiatric/Behavioral:  Negative for depression. The patient is not  nervous/anxious.   All other systems reviewed and are negative.    Allergies  Allergen Reactions   Allopurinol Hives and Swelling   Diltiazem Itching and Rash   Chlorhexidine      lump in throat    Peridex [Chlorhexidine Gluconate]     Mouth swelling  Blisters    Ondansetron Rash   Penicillins Other (See Comments) and Nausea Only    Pt unsure of rxn; showed up on allergy test Pt unsure of rxn; showed up on allergy test Pt unsure of rxn; showed up on allergy test And dirrhea    Past Medical History:  Diagnosis Date   Chronic a-fib (HCC)    Chronic systolic CHF (congestive heart failure) ([place])    Depression    Edema 09/26/2014   Hemochromatosis    History of kidney stones    History of pulmonary embolism    Hyperlipidemia    Hypertension    Migraine    Obesity    OSA (obstructive sleep apnea)    on CPAP   Osteoarthritis    bilateral knees   [place] spotted fever 2011   Past Surgical History:  Procedure Laterality Date   CARDIAC CATHETERIZATION  2012   [facility]   KNEE SURGERY     right knee    LITHOTRIPSY  07/31/2014   URETERAL STENT PLACEMENT  2016   URETEROSCOPY     Social History   Socioeconomic History   Marital status: Married    Spouse name: Not on file   Number of children: Not on file   Years of education: Not on file   Highest education level: Not on file  Occupational History   Not on file  Tobacco Use   Smoking status: Never   Smokeless tobacco: Never  Vaping Use   Vaping Use: Never used  Substance and Sexual Activity   Alcohol use: No    Comment: occasional; 3 times a year   Drug use: No   Sexual activity: Not Currently  Other Topics Concern   Not on file  Social History Narrative   Not on file   Social Determinants of Health   Financial Resource Strain: Low Risk  (08/09/2021)   Overall Financial Resource Strain (CARDIA)    Difficulty of Paying Living Expenses: Not hard at all  Food Insecurity: No Food Insecurity (08/09/2021)    Hunger Vital Sign    Worried About Running Out of Food in the Last Year: Never true    Ran Out of Food in the Last Year: Never true  Transportation Needs: No Transportation Needs (08/09/2021)   PRAPARE - Hydrologist (Medical): No    Lack of Transportation (Non-Medical): No  Physical Activity: Sufficiently Active (08/09/2021)   Exercise Vital Sign    Days of Exercise per Week: 3 days    Minutes of Exercise per Session: 90 min  Stress: No Stress Concern Present (08/09/2021)   [place]    Feeling of Stress : Not at all  Social Connections: Unknown (08/09/2021)   Social Connection and Isolation Panel [NHANES]    Frequency of Communication with Friends and Family: More than three times a week    Frequency of Social Gatherings with Friends and Family: More than three times a week    Attends Religious Services: Not on file    Active Member of Clubs or Organizations: Yes    Attends Archivist Meetings: More than 4 times per year    Marital Status: Not on file  Intimate Partner Violence: Not At Risk (08/09/2021)   Humiliation, Afraid, Rape, and Kick questionnaire    Fear of Current or Ex-Partner: No    Emotionally Abused: No    Physically Abused: No    Sexually Abused: No   Immunization History  Administered Date(s) Administered   COVID-19, mRNA, vaccine(Comirnaty)12 years and older 03/17/2022   Fluad Quad(high Dose 65+) 03/17/2022   Influenza,inj,Quad PF,6+ Mos 03/20/2013, 02/27/2015, 01/26/2017, 02/19/2018, 02/05/2019, 02/25/2020, 02/19/2021   Influenza-Unspecified 03/08/2014, 02/26/2016, 02/17/2018, 02/19/2018   Moderna SARS-COV2 Booster Vaccination 09/01/2020, 02/19/2021, 03/17/2022   Moderna Sars-Covid-2 Vaccination 08/03/2019, 08/31/2019, 03/30/2020   PNEUMOCOCCAL CONJUGATE-20 10/19/2021   Pneumococcal Polysaccharide-23 05/06/2015   Respiratory Syncytial Virus Vaccine,Recomb  Aduvanted(Arexvy) 02/17/2022   Tdap 02/17/2022   Vaccinia,smallpox Monkeypox Vaccine Live,pf 02/17/2022   Zoster Recombinat (Shingrix) 02/17/2022   Family History  Problem Relation Age of Onset   Thyroid disease Mother    Cancer Father        lung   Arthritis Father    Hypertension Father    Cancer Paternal Grandfather        Throat cancer    Current Outpatient Medications:    acetaminophen (TYLENOL) 500 MG tablet, Take 1,000 mg by mouth every 6 (six) hours as needed., Disp: , Rfl:    apixaban (ELIQUIS) 5 MG TABS tablet, Take 1 tablet (5 mg total) by mouth 2 (two) times daily., Disp: 180 tablet, Rfl: 1   Cholecalciferol (VITAMIN D) 2000 units CAPS, daily. , Disp: , Rfl:    digoxin (LANOXIN) 0.25 MG tablet, Take 1 tablet (0.25 mg total) by mouth daily., Disp: 90 tablet, Rfl: 2  furosemide (LASIX) 20 MG tablet, Take 1 tablet (20 mg total) by mouth daily as needed for edema., Disp: 90 tablet, Rfl: 3   loratadine (CLARITIN) 10 MG tablet, Take 10 mg by mouth daily., Disp: , Rfl:    metoprolol succinate (TOPROL-XL) 100 MG 24 hr tablet, TAKE 2 TABLETS EVERY MORNING  AND TAKE 1 TABLET EVERY EVENING, Disp: 270 tablet, Rfl: 3   Multiple Vitamins-Minerals (CENTRUM SILVER ADULT 50+) TABS, Take 1 tablet by mouth daily., Disp: , Rfl:    pravastatin (PRAVACHOL) 80 MG tablet, Take 1 tablet (80 mg total) by mouth every evening. PLEASE CALL TO SCHEDULE OFFICE VISIT FOR FURTHER REFILLS. THANK YOU!, Disp: 90 tablet, Rfl: 3   sacubitril-valsartan (ENTRESTO) 24-26 MG, Take 1 tablet by mouth 2 (two) times daily., Disp: 180 tablet, Rfl: 3   traMADol (ULTRAM) 50 MG tablet, TAKE 1 TABLET(50 MG) BY MOUTH EVERY 6 HOURS AS NEEDED FOR PAIN, Disp: 90 tablet, Rfl: 0   verapamil (CALAN-SR) 120 MG CR tablet, TAKE 1 TABLET DAILY, Disp: 90 tablet, Rfl: 2  Current Facility-Administered Medications:    Study - [name] - olezarsen 50 mg, 80 mg or placebo SQ injection (PI-Hilty), 50 mg, Subcutaneous, Q28 days, Hilty,  Nadean Corwin, MD  Physical exam: Exam limited due to telemedicine Physical Exam Constitutional:      Appearance: He is obese. He is not ill-appearing.  Pulmonary:     Effort: No respiratory distress.  Skin:    Coloration: Skin is not jaundiced or pale.  Neurological:     Mental Status: He is alert and oriented to person, place, and time.  Psychiatric:        Mood and Affect: Mood normal.        Behavior: Behavior normal.        Latest Ref Rng & Units 05/11/2022    1:01 PM 02/15/2022    2:05 PM 11/17/2021    1:57 PM  CBC  WBC 4.0 - 10.5 K/uL 8.0  7.3  7.3   Hemoglobin 13.0 - 17.0 g/dL 17.7  17.3  17.7   Hematocrit 39.0 - 52.0 % 50.2  49.7  50.4   Platelets 150 - 400 K/uL 198  199  205       Latest Ref Rng & Units 07/20/2021   10:45 AM 07/12/2021    9:49 AM 03/29/2021   10:02 AM  CMP  Glucose 70 - 99 mg/dL 109  96  96   BUN 8 - 27 mg/dL '17  23  16   '$ Creatinine 0.76 - 1.27 mg/dL 1.01  1.37  0.93   Sodium 134 - 144 mmol/L 137  143  139   Potassium 3.5 - 5.2 mmol/L 4.7  5.1  4.6   Chloride 96 - 106 mmol/L 97  102  102   CO2 20 - 29 mmol/L '24  23  26   '$ Calcium 8.6 - 10.2 mg/dL 9.5  9.7  9.6   Total Protein 6.0 - 8.5 g/dL  7.1  6.6   Total Bilirubin 0.0 - 1.2 mg/dL  0.8  1.2   Alkaline Phos 44 - 121 IU/L  57  45   AST 0 - 40 IU/L  18  21   ALT 0 - 44 IU/L  15  24    Iron/TIBC/Ferritin/ %Sat    Component Value Date/Time   IRON 158 08/10/2021 1353   TIBC 365 08/10/2021 1353   FERRITIN 107 05/11/2022 1301   IRONPCTSAT 43 (H) 08/10/2021 1353   IRONPCTSAT  32 06/22/2017 0804     Assessment and plan- Patient is a 66 y.o. male   Encounter Diagnoses  Name Primary?   Hereditary hemochromatosis ([place]) Yes   Polycythemia    Hemochromatosis- I discussed with the patient the entire mechanism of hemochromatosis.  We discussed the clinical symptoms of hemochromatosis including elevation of liver function tests as well as skin hyperpigmentation, diabetes, arthralgias and sometimes EKG  abnormalities. The severity of the symptoms depending on whether the patient is homozygous or heterozygous. 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 Goal ferritin < 100. We reviewed labs from 05/11/22. Ferritin now increased to 107. Plan for 400 cc phlebotomy tomorrow. He has varied in frequency of phlebotomy but more recently has been 9 months. Will plan to extend next lab check to 4 months, +/- phlebotomy. Could consider [place] screening in the future.  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 No additional workup at this time. Phlebotomy as planned.   Disposition: Phlebotomy tomorrow as planned 4 mo- labs (cbc, ferritin), D2 +/- phlebotomy 8 mo- labs (cbc, ferritin), D2 virtual visit, D3 +/- phlebotomy- la  Visit Diagnosis 1. Hereditary hemochromatosis ([place])   2. Polycythemia    Patient expressed understanding and was in agreement with this plan. He also understands that He can call clinic at any time with any questions, concerns, or complaints.   I discussed the assessment and treatment plan with the patient. The patient was provided an opportunity to ask questions and all were answered. The patient agreed with the plan and demonstrated an understanding of the instructions.   The patient was advised to call back or seek an in-person evaluation if the symptoms worsen or if the condition fails to improve as anticipated.   I spent 15 minutes face-to-face video visit time dedicated to the care of this patient on the date of this encounter to include pre-visit review of labs and trends, face-to-face time with the patient, and post visit ordering of testing/documentation.   Thank you for allowing me to participate in the care of this very pleasant patient.   Beckey Rutter, DNP, AGNP-C [place] [facility] [phone] (clinic)

## 2022-05-13 ENCOUNTER — Inpatient Hospital Stay: Payer: Medicare (Managed Care)

## 2022-05-13 NOTE — Patient Instructions (Signed)

## 2022-05-20 ENCOUNTER — Other Ambulatory Visit: Payer: Self-pay | Admitting: Family Medicine

## 2022-05-20 DIAGNOSIS — M170 Bilateral primary osteoarthritis of knee: Secondary | ICD-10-CM

## 2022-05-23 ENCOUNTER — Encounter: Payer: Self-pay | Admitting: Cardiovascular Disease

## 2022-05-23 MED ORDER — TRAMADOL HCL 50 MG PO TABS: ORAL_TABLET | ORAL | 0 refills | Status: DC

## 2022-06-01 ENCOUNTER — Encounter: Payer: Self-pay | Admitting: Cardiovascular Disease

## 2022-06-01 ENCOUNTER — Encounter: Payer: Self-pay | Admitting: *Deleted

## 2022-06-01 DIAGNOSIS — Z006 Encounter for examination for normal comparison and control in clinical research program: Secondary | ICD-10-CM

## 2022-06-01 NOTE — Research (Signed)
Spoke with Carlos Crosby to remind him of his appointment tomorrow with research at 0900, also gave him the parking code. Voices understanding.

## 2022-06-02 ENCOUNTER — Encounter: Payer: Self-pay | Admitting: *Deleted

## 2022-06-02 ENCOUNTER — Encounter: Payer: Medicare (Managed Care) | Admitting: *Deleted

## 2022-06-02 ENCOUNTER — Other Ambulatory Visit: Payer: Self-pay

## 2022-06-02 DIAGNOSIS — Z006 Encounter for examination for normal comparison and control in clinical research program: Secondary | ICD-10-CM

## 2022-06-02 MED ORDER — STUDY - ESSENCE - OLEZARSEN 50 MG, 80 MG OR PLACEBO SQ INJECTION (PI-HILTY)
50.0000 mg | INJECTION | SUBCUTANEOUS | Status: DC
  Administered 2022-06-02: 50 mg via SUBCUTANEOUS
  Filled 2022-06-02: qty 0.5

## 2022-06-02 NOTE — Research (Signed)
     TREATMENT DAY 113 - STUDY WEEK 17    Subject Number: [id number]            Randomization Number: [id number]             Date: 02-Jun-2022      '[x]'$ Vital Signs Collected - Blood Pressure:115/67 - Heart Rate:93 - Respiratory Rate:24 - Temperature: 98.0 - Oxygen Saturation:97%    '[x]'$   (abdominal pain only) since last visit  '[x]'$  Assessment of ER Visits, Hospitalizations, and Inpatient Days  '[x]'$  Adverse Events and Concomitant Medications  '[x]'$  Diet, Lifestyle, and Alcohol Counseling   '[x]'$  Study Drug: [place] Injection   Mr Gjerde is here for Week 60 Day 113 for the [name] research study. He reports no abd pain, no visits to the ED or Urgent care. No changes in his medications. VS taken at 0928 and Injection given in left lower abd at 0928. Tol well. Next appointment scheduled for Jan 25 at 0900.    Current Outpatient Medications:    acetaminophen (TYLENOL) 500 MG tablet, Take 1,000 mg by mouth every 6 (six) hours as needed., Disp: , Rfl:    apixaban (ELIQUIS) 5 MG TABS tablet, Take 1 tablet (5 mg total) by mouth 2 (two) times daily., Disp: 180 tablet, Rfl: 1   Cholecalciferol (VITAMIN D) 2000 units CAPS, daily. , Disp: , Rfl:    digoxin (LANOXIN) 0.25 MG tablet, Take 1 tablet (0.25 mg total) by mouth daily., Disp: 90 tablet, Rfl: 2   furosemide (LASIX) 20 MG tablet, Take 1 tablet (20 mg total) by mouth daily as needed for edema., Disp: 90 tablet, Rfl: 3   loratadine (CLARITIN) 10 MG tablet, Take 10 mg by mouth daily., Disp: , Rfl:    metoprolol succinate (TOPROL-XL) 100 MG 24 hr tablet, TAKE 2 TABLETS EVERY MORNING  AND TAKE 1 TABLET EVERY EVENING, Disp: 270 tablet, Rfl: 3   Multiple Vitamins-Minerals (CENTRUM SILVER ADULT 50+) TABS, Take 1 tablet by mouth daily., Disp: , Rfl:    pravastatin (PRAVACHOL) 80 MG tablet, Take 1 tablet (80 mg total) by mouth every evening. PLEASE CALL TO SCHEDULE OFFICE VISIT FOR FURTHER REFILLS. THANK YOU!, Disp: 90 tablet, Rfl: 3   sacubitril-valsartan  (ENTRESTO) 24-26 MG, Take 1 tablet by mouth 2 (two) times daily., Disp: 180 tablet, Rfl: 3   traMADol (ULTRAM) 50 MG tablet, TAKE 1 TABLET(50 MG) BY MOUTH EVERY 6 HOURS AS NEEDED FOR PAIN, Disp: 90 tablet, Rfl: 0   verapamil (CALAN-SR) 120 MG CR tablet, TAKE 1 TABLET DAILY, Disp: 90 tablet, Rfl: 2  Current Facility-Administered Medications:    Study - [name] - olezarsen 50 mg, 80 mg or placebo SQ injection (PI-Hilty), 50 mg, Subcutaneous, Q28 days, Hilty, Nadean Corwin, MD

## 2022-06-10 ENCOUNTER — Other Ambulatory Visit: Payer: Self-pay | Admitting: *Deleted

## 2022-06-10 DIAGNOSIS — I4891 Unspecified atrial fibrillation: Secondary | ICD-10-CM

## 2022-06-10 MED ORDER — APIXABAN 5 MG PO TABS: 5.0000 mg | ORAL_TABLET | Freq: Two times a day (BID) | ORAL | 1 refills | Status: DC

## 2022-06-15 ENCOUNTER — Other Ambulatory Visit: Payer: Self-pay | Admitting: Cardiovascular Disease

## 2022-06-17 ENCOUNTER — Other Ambulatory Visit: Payer: Self-pay | Admitting: Family Medicine

## 2022-06-17 DIAGNOSIS — M170 Bilateral primary osteoarthritis of knee: Secondary | ICD-10-CM

## 2022-06-21 ENCOUNTER — Other Ambulatory Visit: Payer: Self-pay | Admitting: Family Medicine

## 2022-06-21 ENCOUNTER — Encounter: Payer: Self-pay | Admitting: Family Medicine

## 2022-06-21 DIAGNOSIS — M170 Bilateral primary osteoarthritis of knee: Secondary | ICD-10-CM

## 2022-06-21 DIAGNOSIS — G4733 Obstructive sleep apnea (adult) (pediatric): Secondary | ICD-10-CM | POA: Diagnosis not present

## 2022-06-21 NOTE — Telephone Encounter (Addendum)
  Last refill: 05/23/2022  LOV: 04/01/22 NOV: 07/08/22  Note: pt also needs a pain contract on file. I will make a note for his next OV next month.

## 2022-06-21 NOTE — Telephone Encounter (Signed)
Duplicate request, see other request & please decline this one

## 2022-06-27 LAB — HEPATIC FUNCTION PANEL
ALT: 18 U/L (ref 10–40)
AST: 16 (ref 14–40)
Bilirubin, Total: 0.4

## 2022-06-27 LAB — IRON,TIBC AND FERRITIN PANEL
Iron: 147
UIBC: 178

## 2022-06-27 LAB — BASIC METABOLIC PANEL
Glucose: 250
Potassium: 4.5 mEq/L (ref 3.5–5.1)
Sodium: 136 — AB (ref 137–147)

## 2022-06-27 LAB — COMPREHENSIVE METABOLIC PANEL: Calcium: 8.9 (ref 8.7–10.7)

## 2022-06-27 LAB — CBC AND DIFFERENTIAL
Neutrophils Absolute: 3.6
Platelets: 152 10*3/uL (ref 150–400)
WBC: 6.2

## 2022-06-29 ENCOUNTER — Encounter: Payer: Self-pay | Admitting: *Deleted

## 2022-06-29 DIAGNOSIS — Z006 Encounter for examination for normal comparison and control in clinical research program: Secondary | ICD-10-CM

## 2022-06-29 NOTE — Research (Signed)
Spoke with Carlos Crosby to remind him of his appointment tomorrow at 0900, no need to be NPO, and gave him the parking code. Voices understanding.

## 2022-06-30 ENCOUNTER — Other Ambulatory Visit: Payer: Self-pay

## 2022-06-30 ENCOUNTER — Encounter: Payer: Medicare (Managed Care) | Admitting: *Deleted

## 2022-06-30 DIAGNOSIS — Z006 Encounter for examination for normal comparison and control in clinical research program: Secondary | ICD-10-CM

## 2022-06-30 MED ORDER — STUDY - ESSENCE - OLEZARSEN 50 MG, 80 MG OR PLACEBO SQ INJECTION (PI-HILTY)
50.0000 mg | INJECTION | SUBCUTANEOUS | Status: DC
  Administered 2022-06-30: 50 mg via SUBCUTANEOUS
  Filled 2022-06-30: qty 0.5

## 2022-06-30 NOTE — Research (Signed)
     TREATMENT DAY 141 - STUDY WEEK 21    Subject Number: [id number]              Randomization Number:[id number]            Date:30-Jun-2022      '[x]'$ Vital Signs Collected - Blood Pressure:109/62 - Heart Rate:81 - Respiratory Rate:24 - Temperature:97.2 - Oxygen Saturation:98%   '[x]'$ (abdominal pain only) since last visit  '[x]'$  Assessment of ER Visits, Hospitalizations, and Inpatient Days  '[x]'$  Adverse Events and Concomitant Medications  '[x]'$  Diet, Lifestyle, and Alcohol Counseling   '[x]'$  Study Drug: [place] Injection   Mr. Rosendahl is here for Week 21 Day 141 of [name] research study. He Reports no abd pain no visits to the  Ed or Urgent care since last seen. Vs taken at 0900, injection given in left lower abd at 0934. Tol well. No change in his medications.  Next appointment scheduled for Feb 22 at 0830.   Current Outpatient Medications:    acetaminophen (TYLENOL) 500 MG tablet, Take 1,000 mg by mouth every 6 (six) hours as needed., Disp: , Rfl:    apixaban (ELIQUIS) 5 MG TABS tablet, Take 1 tablet (5 mg total) by mouth 2 (two) times daily., Disp: 180 tablet, Rfl: 1   Cholecalciferol (VITAMIN D) 2000 units CAPS, daily. , Disp: , Rfl:    digoxin (LANOXIN) 0.25 MG tablet, Take 1 tablet (0.25 mg total) by mouth daily., Disp: 90 tablet, Rfl: 2   furosemide (LASIX) 20 MG tablet, Take 1 tablet (20 mg total) by mouth daily as needed for edema., Disp: 90 tablet, Rfl: 3   loratadine (CLARITIN) 10 MG tablet, Take 10 mg by mouth daily., Disp: , Rfl:    metoprolol succinate (TOPROL-XL) 100 MG 24 hr tablet, TAKE 2 TABLETS EVERY MORNING AND 1 TABLET EVERY EVENING, Disp: 270 tablet, Rfl: 1   Multiple Vitamins-Minerals (CENTRUM SILVER ADULT 50+) TABS, Take 1 tablet by mouth daily., Disp: , Rfl:    pravastatin (PRAVACHOL) 80 MG tablet, Take 1 tablet (80 mg total) by mouth every evening. PLEASE CALL TO SCHEDULE OFFICE VISIT FOR FURTHER REFILLS. THANK YOU!, Disp: 90 tablet, Rfl: 3   sacubitril-valsartan  (ENTRESTO) 24-26 MG, Take 1 tablet by mouth 2 (two) times daily., Disp: 180 tablet, Rfl: 3   traMADol (ULTRAM) 50 MG tablet, TAKE 1 TABLET(50 MG) BY MOUTH EVERY 6 HOURS AS NEEDED FOR PAIN, Disp: 90 tablet, Rfl: 0   verapamil (CALAN-SR) 120 MG CR tablet, TAKE 1 TABLET DAILY, Disp: 90 tablet, Rfl: 2  Current Facility-Administered Medications:    Study - [name] - olezarsen 50 mg, 80 mg or placebo SQ injection (PI-Hilty), 50 mg, Subcutaneous, Q28 days, Hilty, Nadean Corwin, MD

## 2022-07-07 ENCOUNTER — Other Ambulatory Visit: Payer: Self-pay | Admitting: Cardiovascular Disease

## 2022-07-08 ENCOUNTER — Encounter: Payer: Self-pay | Admitting: Family Medicine

## 2022-07-08 ENCOUNTER — Ambulatory Visit (INDEPENDENT_AMBULATORY_CARE_PROVIDER_SITE_OTHER): Payer: Medicare (Managed Care) | Admitting: Family Medicine

## 2022-07-08 VITALS — BP 125/70 | HR 84 | Temp 97.4°F | Ht 71.0 in | Wt 343.2 lb

## 2022-07-08 DIAGNOSIS — Z125 Encounter for screening for malignant neoplasm of prostate: Secondary | ICD-10-CM

## 2022-07-08 DIAGNOSIS — K429 Umbilical hernia without obstruction or gangrene: Secondary | ICD-10-CM

## 2022-07-08 DIAGNOSIS — M170 Bilateral primary osteoarthritis of knee: Secondary | ICD-10-CM

## 2022-07-08 DIAGNOSIS — R7303 Prediabetes: Secondary | ICD-10-CM

## 2022-07-08 DIAGNOSIS — E782 Mixed hyperlipidemia: Secondary | ICD-10-CM

## 2022-07-08 LAB — LIPID PANEL
Cholesterol: 127 mg/dL (ref 0–200)
HDL: 37.7 mg/dL — ABNORMAL LOW (ref >39.00)
LDL Cholesterol: 70 mg/dL (ref 0–99)
NonHDL: 89.62
Total CHOL/HDL Ratio: 3
Triglycerides: 98 mg/dL (ref 0.0–149.0)
VLDL: 19.6 mg/dL (ref 0.0–40.0)

## 2022-07-08 LAB — PSA, MEDICARE: PSA: 0.28 ng/ml (ref 0.10–4.00)

## 2022-07-08 LAB — HEMOGLOBIN A1C: Hgb A1c MFr Bld: 5.4 % (ref 4.6–6.5)

## 2022-07-08 NOTE — Progress Notes (Signed)
Carlos Rumps, MD Phone: [phone]  Carlos Crosby is a 67 y.o. male who presents today for f/u.  HYPERLIPIDEMIA Symptoms Chest pain on exertion:  no   Leg claudication:   no Medications: Compliance- taking pravastatin Right upper quadrant pain- no  Muscle aches- no He is in a study for his triglycerides. He think he may be getting the active drug.  Lipid Panel     Component Value Date/Time   CHOL 155 03/29/2021 1002   TRIG 201.0 (H) 03/29/2021 1002   HDL 41.10 03/29/2021 1002   CHOLHDL 4 03/29/2021 1002   VLDL 40.2 (H) 03/29/2021 1002   LDLCALC 77 02/25/2020 0854   LDLDIRECT 102.0 03/29/2021 1002   Chronic arthritic pain: Patient notes this is adequately managed with tramadol as needed.  Umbilical hernia: This is a chronic issue.  He notes occasional pain from this.  He notes that hernia does not ever pop out.  He did see his surgeon in the past who noted he did not need surgery at this time unless it starts to bother him more frequently.  Obesity: Patient is eating 1000-1100 cal daily.  He is sticking to less than 60 g of carbs per day.  He is trying to eat high protein and vegetables.  He swims twice a week.  Social History   Tobacco Use  Smoking Status Never  Smokeless Tobacco Never    Current Outpatient Medications on File Prior to Visit  Medication Sig Dispense Refill   acetaminophen (TYLENOL) 500 MG tablet Take 1,000 mg by mouth every 6 (six) hours as needed.     apixaban (ELIQUIS) 5 MG TABS tablet Take 1 tablet (5 mg total) by mouth 2 (two) times daily. 180 tablet 1   Cholecalciferol (VITAMIN D) 2000 units CAPS daily.      digoxin (LANOXIN) 0.25 MG tablet TAKE 1 TABLET DAILY 90 tablet 2   furosemide (LASIX) 20 MG tablet Take 1 tablet (20 mg total) by mouth daily as needed for edema. 90 tablet 3   loratadine (CLARITIN) 10 MG tablet Take 10 mg by mouth daily.     metoprolol succinate (TOPROL-XL) 100 MG 24 hr tablet TAKE 2 TABLETS EVERY MORNING AND 1 TABLET EVERY  EVENING 270 tablet 1   Multiple Vitamins-Minerals (CENTRUM SILVER ADULT 50+) TABS Take 1 tablet by mouth daily.     pravastatin (PRAVACHOL) 80 MG tablet Take 1 tablet (80 mg total) by mouth every evening. PLEASE CALL TO SCHEDULE OFFICE VISIT FOR FURTHER REFILLS. THANK YOU! 90 tablet 3   sacubitril-valsartan (ENTRESTO) 24-26 MG Take 1 tablet by mouth 2 (two) times daily. 180 tablet 3   traMADol (ULTRAM) 50 MG tablet TAKE 1 TABLET(50 MG) BY MOUTH EVERY 6 HOURS AS NEEDED FOR PAIN 90 tablet 0   verapamil (CALAN-SR) 120 MG CR tablet TAKE 1 TABLET DAILY 90 tablet 2   Current Facility-Administered Medications on File Prior to Visit  Medication Dose Route Frequency Provider Last Rate Last Admin   Study - [name] - olezarsen 50 mg, 80 mg or placebo SQ injection (PI-Hilty)  50 mg Subcutaneous Q28 days Pixie Casino, MD   50 mg at 06/30/22 0934     ROS see history of present illness  Objective  Physical Exam Vitals:   07/08/22 0900  BP: 125/70  Pulse: 84  Temp: (!) 97.4 F (36.3 C)  SpO2: 98%    BP Readings from Last 3 Encounters:  07/08/22 125/70  06/30/22 109/62  06/02/22 115/67   Wt Readings  from Last 3 Encounters:  07/08/22 (!) 343 lb 3.2 oz (155.7 kg)  04/01/22 (!) 337 lb 12.8 oz (153.2 kg)  03/23/22 (!) 346 lb (156.9 kg)    Physical Exam Constitutional:      General: He is not in acute distress.    Appearance: He is not diaphoretic.  Cardiovascular:     Rate and Rhythm: Normal rate and regular rhythm.     Heart sounds: Normal heart sounds.  Pulmonary:     Effort: Pulmonary effort is normal.     Breath sounds: Normal breath sounds.  Abdominal:     General: Bowel sounds are normal.     Comments: Umbilicus is nontender, no palpable hernia  Skin:    General: Skin is warm and dry.  Neurological:     Mental Status: He is alert.      Assessment/Plan: Please see individual problem list.  Primary osteoarthritis of both knees Assessment & Plan: Chronic issue.  He  can continue tramadol 50 mg by mouth every 6 hours as needed for pain.   Mixed hyperlipidemia Assessment & Plan: Chronic issue.  Continue pravastatin 80 mg daily.  Check lab work today.  Orders: -     Lipid panel  Morbid (severe) obesity due to excess calories [facility]) Assessment & Plan: Chronic issue.  Encouraged the patient to eat at least 1200 cal a day.  He will continue with his exercise regimen.   Prediabetes Assessment & Plan: Chronic issue.  Check A1c.  Orders: -     Hemoglobin W9N  Umbilical hernia without obstruction and without gangrene Assessment & Plan: Chronic issue.  He will monitor.  If he has any worsening symptoms he will let us know.  Given hernia return precautions.   Prostate cancer screening -     PSA, Medicare    Return in about 3 months (around 10/06/2022) for chronic pain.   Carlos Rumps, MD [place]

## 2022-07-08 NOTE — Assessment & Plan Note (Signed)
Chronic issue.  Check A1c. 

## 2022-07-08 NOTE — Assessment & Plan Note (Signed)
Chronic issue.  He can continue tramadol 50 mg by mouth every 6 hours as needed for pain.

## 2022-07-08 NOTE — Assessment & Plan Note (Signed)
Chronic issue.  Encouraged the patient to eat at least 1200 cal a day.  He will continue with his exercise regimen.

## 2022-07-08 NOTE — Assessment & Plan Note (Signed)
Chronic issue.  He will monitor.  If he has any worsening symptoms he will let us know.  Given hernia return precautions.

## 2022-07-08 NOTE — Assessment & Plan Note (Addendum)
Chronic issue.  Continue pravastatin 80 mg daily.  Check lab work today.

## 2022-07-22 DIAGNOSIS — G4733 Obstructive sleep apnea (adult) (pediatric): Secondary | ICD-10-CM | POA: Diagnosis not present

## 2022-07-27 ENCOUNTER — Encounter: Payer: Self-pay | Admitting: *Deleted

## 2022-07-27 DIAGNOSIS — Z006 Encounter for examination for normal comparison and control in clinical research program: Secondary | ICD-10-CM

## 2022-07-27 NOTE — Research (Signed)
Spoke with Carlos Crosby to remind him of his appointment tomorrow at 0830, gave him the parking code and reminded him to be NPO.

## 2022-07-28 ENCOUNTER — Other Ambulatory Visit: Payer: Self-pay

## 2022-07-28 ENCOUNTER — Encounter: Payer: Medicare (Managed Care) | Admitting: *Deleted

## 2022-07-28 DIAGNOSIS — Z006 Encounter for examination for normal comparison and control in clinical research program: Secondary | ICD-10-CM

## 2022-07-28 MED ORDER — STUDY - ESSENCE - OLEZARSEN 50 MG, 80 MG OR PLACEBO SQ INJECTION (PI-HILTY)
50.0000 mg | INJECTION | SUBCUTANEOUS | Status: DC
  Administered 2022-07-28: 50 mg via SUBCUTANEOUS
  Filled 2022-07-28: qty 0.5

## 2022-07-28 NOTE — Research (Addendum)
Carlos Crosby Week 25 Day 169 23-Feb 2024               Chemistry: Hs-C- Reactive Protein 5.5 mg/L               '[]'$ Clinically Significant  '[]'$ Not Clinically Significant    Hematology: MCV 100.1 fL                                           '[]'$ Clinically Significant  '[]'$ Not Clinically Significant  Coagulation  Prothrombin Time (PT) 15.0                      '[]'$ Clinically Significant  '[]'$ Not Clinically Significant   Urine Chemistry: Urine Albumin 3.87 mg/dL                         '[]'$ Clinically Significant  '[]'$ Not Clinically Significant Albumin Creatinine Ratio 49 mg/g             '[]'$ Clinically Significant  '[]'$ Not Clinically Significant   Any further action needed to be taken per the PI?       TREATMENT DAY 169 - STUDY WEEK 25    Subject Number: [id number]              Randomization Number: [id number]             Date: 28-Jul-2022   '[x]'$ Vital Signs Collected - Blood Pressure: 116/75 - Weight:336.6 lbs - Heart Rate:78 - Respiratory Rate:20 - Temperature:97.7 - Oxygen Saturation: 100%  '[]'$  Physical Exam Completed by PI or SUB-I  '[x]'$  Extended Urinalysis   '[x]'$  Lab collection per protocol  '[x]'$   (abdominal pain only) since last visit  '[x]'$  Assessment of ER Visits, Hospitalizations, and Inpatient Days  '[x]'$  Adverse Events and Concomitant Medications  '[x]'$  Diet, Lifestyle, and Alcohol Counseling   '[x]'$  Study Drug: [place] Injection   Carlos Crosby is here for Week 25 Day 169 of [name]. He reports no abd pain, no visits to the Ed or Urgent care, and no changes in his medications. VS taken at 0831, Blood work drawn at 0836, and Urine obtained at 0830. Injection was given in right lower quad at [place] [facility]. Kit number [id number]. Scheduled next appointment for March 21 at 0830.  NP was out sick today so no one was able to do exam.

## 2022-08-05 ENCOUNTER — Telehealth: Payer: Self-pay | Admitting: Family Medicine

## 2022-08-05 NOTE — Telephone Encounter (Signed)
Contacted Carlos Crosby to schedule their annual wellness visit. Appointment made for 08/11/2022.  Thank you,  [place] Direct dial  [phone]

## 2022-08-11 ENCOUNTER — Ambulatory Visit (INDEPENDENT_AMBULATORY_CARE_PROVIDER_SITE_OTHER): Payer: Medicare (Managed Care)

## 2022-08-11 VITALS — Ht 71.0 in | Wt 336.0 lb

## 2022-08-11 DIAGNOSIS — Z0000 Encounter for general adult medical examination without abnormal findings: Secondary | ICD-10-CM | POA: Diagnosis not present

## 2022-08-11 NOTE — Progress Notes (Signed)
Subjective:   Carlos Crosby is a 67 y.o. male who presents for Medicare Annual/Subsequent preventive examination.  Review of Systems    No ROS.  Medicare Wellness Virtual Visit.  Visual/audio telehealth visit, UTA vital signs.   See social history for additional risk factors.   Cardiac Risk Factors include: advanced age (>49mn, >>29women)     Objective:    Today's Vitals   08/11/22 0847  Weight: (!) 336 lb (152.4 kg)  Height: '5\' 11"'$  (1.803 m)   Body mass index is 46.86 kg/m.     08/11/2022    8:49 AM 05/12/2022    2:06 PM 08/12/2021    2:01 PM 08/09/2021    8:30 AM 02/11/2021    1:32 PM 10/11/2020    2:46 AM 08/06/2020    2:37 PM  Advanced Directives  Does Patient Have a Medical Advance Directive? Yes Yes No Yes No No No  Type of [place]   Living will     Does patient want to make changes to medical advance directive? No - Patient declined  No - Patient declined No - Patient declined   No - Patient declined  Copy of HProsperin Chart? No - copy requested   No - copy requested     Would patient like information on creating a medical advance directive?     No - Patient declined      Current Medications (verified) Outpatient Encounter Medications as of 08/11/2022  Medication Sig   acetaminophen (TYLENOL) 500 MG tablet Take 1,000 mg by mouth every 6 (six) hours as needed.   apixaban (ELIQUIS) 5 MG TABS tablet Take 1 tablet (5 mg total) by mouth 2 (two) times daily.   Cholecalciferol (VITAMIN D) 2000 units CAPS daily.    digoxin (LANOXIN) 0.25 MG tablet TAKE 1 TABLET DAILY   furosemide (LASIX) 20 MG tablet Take 1 tablet (20 mg total) by mouth daily as needed for edema.   loratadine (CLARITIN) 10 MG tablet Take 10 mg by mouth daily.   metoprolol succinate (TOPROL-XL) 100 MG 24 hr tablet TAKE 2 TABLETS EVERY MORNING AND 1 TABLET EVERY EVENING   Multiple Vitamins-Minerals (CENTRUM SILVER ADULT 50+) TABS Take 1 tablet  by mouth daily.   pravastatin (PRAVACHOL) 80 MG tablet Take 1 tablet (80 mg total) by mouth every evening. PLEASE CALL TO SCHEDULE OFFICE VISIT FOR FURTHER REFILLS. THANK YOU!   sacubitril-valsartan (ENTRESTO) 24-26 MG Take 1 tablet by mouth 2 (two) times daily.   traMADol (ULTRAM) 50 MG tablet TAKE 1 TABLET(50 MG) BY MOUTH EVERY 6 HOURS AS NEEDED FOR PAIN   verapamil (CALAN-SR) 120 MG CR tablet TAKE 1 TABLET DAILY   Facility-Administered Encounter Medications as of 08/11/2022  Medication   Study - [name] - olezarsen 50 mg, 80 mg or placebo SQ injection (PI-Hilty)    Allergies (verified) Allopurinol, Diltiazem, Chlorhexidine, Peridex [chlorhexidine gluconate], Ondansetron, and Penicillins   History: Past Medical History:  Diagnosis Date   Chronic a-fib (HCC)    Chronic systolic CHF (congestive heart failure) ([place]    Depression    Edema 09/26/2014   Hemochromatosis    History of kidney stones    History of pulmonary embolism    Hyperlipidemia    Hypertension    Migraine    Obesity    OSA (obstructive sleep apnea)    on CPAP   Osteoarthritis    bilateral knees   [facility]  Past Surgical History:  Procedure Laterality Date   CARDIAC CATHETERIZATION  2012   [facility]   KNEE SURGERY     right knee    LITHOTRIPSY  07/31/2014   URETERAL STENT PLACEMENT  2016   URETEROSCOPY     Family History  Problem Relation Age of Onset   Thyroid disease Mother    Cancer Father        lung   Arthritis Father    Hypertension Father    Cancer Paternal Grandfather        Throat cancer   Social History   Socioeconomic History   Marital status: Married    Spouse name: Not on file   Number of children: Not on file   Years of education: Not on file   Highest education level: Not on file  Occupational History   Not on file  Tobacco Use   Smoking status: Never   Smokeless tobacco: Never  Vaping Use   Vaping Use: Never used  Substance and Sexual Activity    Alcohol use: No    Comment: occasional; 3 times a year   Drug use: No   Sexual activity: Not Currently  Other Topics Concern   Not on file  Social History Narrative   Not on file   Social Determinants of Health   Financial Resource Strain: Medium Risk (08/07/2022)   Overall Financial Resource Strain (CARDIA)    Difficulty of Paying Living Expenses: Somewhat hard  Food Insecurity: No Food Insecurity (08/07/2022)   Hunger Vital Sign    Worried About Running Out of Food in the Last Year: Never true    Ran Out of Food in the Last Year: Never true  Transportation Needs: No Transportation Needs (08/07/2022)   PRAPARE - Hydrologist (Medical): No    Lack of Transportation (Non-Medical): No  Physical Activity: Insufficiently Active (08/07/2022)   Exercise Vital Sign    Days of Exercise per Week: 2 days    Minutes of Exercise per Session: 60 min  Stress: No Stress Concern Present (08/07/2022)   [place]    Feeling of Stress : Only a little  Social Connections: Unknown (08/07/2022)   Social Connection and Isolation Panel [NHANES]    Frequency of Communication with Friends and Family: Three times a week    Frequency of Social Gatherings with Friends and Family: Once a week    Attends Religious Services: Not on Advertising copywriter or Organizations: Yes    Attends Music therapist: More than 4 times per year    Marital Status: Married    Tobacco Counseling Counseling given: Not Answered   Clinical Intake:  Pre-visit preparation completed: Yes        Diabetes: No  How often do you need to have someone help you when you read instructions, pamphlets, or other written materials from your doctor or pharmacy?: 1 - Never    Interpreter Needed?: No      Activities of Daily Living    08/07/2022   10:31 AM  In your present state of health, do you have any difficulty  performing the following activities:  Hearing? 0  Vision? 0  Difficulty concentrating or making decisions? 0  Walking or climbing stairs? 1  Comment Cane, wheelchair in use  Dressing or bathing? 1  Comment Family assist  Doing errands, shopping? 0  Preparing Food and eating ? N  Using the Toilet? N  In the past six months, have you accidently leaked urine? N  Do you have problems with loss of bowel control? N  Managing your Medications? N  Managing your Finances? N  Housekeeping or managing your Housekeeping? N    Patient Care Team: Leone Haven, MD as PCP - General (Family Medicine) Minna Merritts, MD as PCP - Cardiology (Cardiology) Deboraha Sprang, MD as PCP - Electrophysiology (Cardiology) Minna Merritts, MD as Consulting Physician (Cardiology)  Indicate any recent Medical Services you may have received from other than [facility] providers in the past year (date may be approximate).     Assessment:   This is a routine wellness examination for Salim.  Patient Medicare AWV questionnaire was completed by the patient on 08/07/22 , I have confirmed that all information answered by patient is correct and no changes since this date.   I connected with  [name] on 08/11/22 by a audio enabled telemedicine application and verified that I am speaking with the correct person using two identifiers.  Patient Location: Home  Provider Location: Office/Clinic  I discussed the limitations of evaluation and management by telemedicine. The patient expressed understanding and agreed to proceed.   Hearing/Vision screen Hearing Screening - Comments:: Patient is able to hear conversational tones without difficulty. No issues reported. Vision Screening - Comments:: Followed by [facility] Wears corrective lenses when driving They have regular follow up with the ophthalmologist    Dietary issues and exercise activities discussed: Current Exercise Habits: Home exercise routine,  Time (Minutes): 60, Frequency (Times/Week): 2, Weekly Exercise (Minutes/Week): 120, Intensity: Mild   Goals Addressed               This Visit's Progress     Patient Stated     Weight (lb) < 220 lb (99.8 kg) (pt-stated)   336 lb (152.4 kg)     Stay active Portion control meals; counting calories Stay hydrated with water Low carb diet       Depression Screen    08/11/2022    8:57 AM 07/08/2022    9:02 AM 04/01/2022    2:36 PM 10/19/2021    9:03 AM 08/09/2021    8:29 AM 07/19/2021    9:42 AM 08/06/2020    8:41 AM  PHQ 2/9 Scores  PHQ - 2 Score 0 0 0 0 0 0 0    Fall Risk    08/07/2022   10:31 AM 07/08/2022    9:02 AM 04/01/2022    2:36 PM 10/19/2021    9:03 AM 08/09/2021    8:32 AM  Fall Risk   Falls in the past year? 0 0 0 0 0  Number falls in past yr:  0 0 0 0  Injury with Fall?  0 0 0   Risk for fall due to :  No Fall Risks No Fall Risks No Fall Risks   Follow up Falls evaluation completed;Falls prevention discussed Falls evaluation completed Falls evaluation completed Falls evaluation completed Falls evaluation completed    FALL RISK PREVENTION PERTAINING TO THE HOME: Home free of loose throw rugs in walkways, pet beds, electrical cords, etc? Yes  Adequate lighting in your home to reduce risk of falls? Yes   ASSISTIVE DEVICES UTILIZED TO PREVENT FALLS:  Life alert? No  Use of a cane, walker or w/c? Yes , electric wheelchair, cane Grab bars in the bathroom? No Shower chair or bench in shower? No  Elevated toilet seat  or a handicapped toilet? Yes   TIMED UP AND GO: Was the test performed? No .    Cognitive Function:    07/24/2018    8:24 AM 07/21/2017    8:59 AM 07/20/2016    8:49 AM  MMSE - Mini Mental State Exam  Orientation to time '5 5 5  '$ Orientation to Place '5 5 5  '$ Registration '3 3 3  '$ Attention/ Calculation '5 5 5  '$ Recall '3 3 2  '$ Recall-comments   2 out of 3 words recalled  Language- name 2 objects '2 2 2  '$ Language- repeat '1 1 1  '$ Language- follow 3 step  command '3 3 3  '$ Language- read & follow direction '1 1 1  '$ Write a sentence '1 1 1  '$ Copy design '1 1 1  '$ Total score '30 30 29        '$ 08/11/2022    8:58 AM  6CIT Screen  What Year? 0 points  What month? 0 points  What time? 0 points  Count back from 20 0 points  Months in reverse 0 points  Repeat phrase 0 points  Total Score 0 points    Immunizations Immunization History  Administered Date(s) Administered   COVID-19, mRNA, vaccine(Comirnaty)12 years and older 03/17/2022   Fluad Quad(high Dose 65+) 03/17/2022   Influenza,inj,Quad PF,6+ Mos 03/20/2013, 02/27/2015, 01/26/2017, 02/19/2018, 02/05/2019, 02/25/2020, 02/19/2021   Influenza-Unspecified 03/08/2014, 02/26/2016, 02/17/2018, 02/19/2018   Moderna SARS-COV2 Booster Vaccination 09/01/2020, 02/19/2021, 03/17/2022   Moderna Sars-Covid-2 Vaccination 08/03/2019, 08/31/2019, 03/30/2020   PNEUMOCOCCAL CONJUGATE-20 10/19/2021   Pneumococcal Polysaccharide-23 05/06/2015   Respiratory Syncytial Virus Vaccine,Recomb Aduvanted(Arexvy) 02/17/2022   Tdap 02/17/2022   Vaccinia,smallpox Monkeypox Vaccine Live,pf 02/17/2022   Zoster Recombinat (Shingrix) 02/17/2022   Qualifies for Shingles Vaccine? Yes   Zostavax completed No . Agrees to update immunization record upon completion.   Covid vaccines- Completed. Agrees to update immunization record.    Screening Tests Health Maintenance  Topic Date Due   COVID-19 Vaccine (4 - 2023-24 season) 08/27/2022 (Originally 05/12/2022)   Zoster Vaccines- Shingrix (2 of 2) 11/11/2022 (Originally 04/14/2022)   Medicare Annual Wellness (AWV)  08/11/2023   Fecal DNA (Cologuard)  08/21/2023   DTaP/Tdap/Td (2 - Td or Tdap) 02/18/2032   Pneumonia Vaccine 21+ Years old  Completed   INFLUENZA VACCINE  Completed   Hepatitis C Screening  Completed   HPV VACCINES  Aged Out   HIV Screening  Discontinued    Health Maintenance There are no preventive care reminders to display for this patient.  Lung Cancer  Screening: (Low Dose CT Chest recommended if Age 29-80 years, 30 pack-year currently smoking OR have quit w/in 15years.) does not qualify.   Hepatitis C Screening: Completed 12/2015.  Vision Screening: Recommended annual ophthalmology exams for early detection of glaucoma and other disorders of the eye.  Dental Screening: Recommended annual dental exams for proper oral hygiene  Community Resource Referral / Chronic Care Management: CRR required this visit?  No   CCM required this visit?  No      Plan:     I have personally reviewed and noted the following in the patient's chart:   Medical and social history Use of alcohol, tobacco or illicit drugs  Current medications and supplements including opioid prescriptions. Patient is currently taking opioid prescriptions. Information provided to patient regarding non-opioid alternatives. Patient advised to discuss non-opioid treatment plan with their provider. Taking Tramadol. Followed by pcp.  Functional ability and status Nutritional status Physical activity Advanced directives List of other  physicians Hospitalizations, surgeries, and ER visits in previous 12 months Vitals Screenings to include cognitive, depression, and falls Referrals and appointments  In addition, I have reviewed and discussed with patient certain preventive protocols, quality metrics, and best practice recommendations. A written personalized care plan for preventive services as well as general preventive health recommendations were provided to patient.     Leta Jungling, LPN   [phone]

## 2022-08-11 NOTE — Patient Instructions (Addendum)
[name] , Thank you for taking time to come for your Medicare Wellness Visit. I appreciate your ongoing commitment to your health goals. Please review the following plan we discussed and let me know if I can assist you in the future.   These are the goals we discussed:  Goals       Patient Stated     Weight (lb) < 220 lb (99.8 kg) (pt-stated)      Stay active Portion control meals; counting calories Stay hydrated with water Low carb diet        This is a list of the screening recommended for you and due dates:  Health Maintenance  Topic Date Due   COVID-19 Vaccine (4 - 2023-24 season) 08/27/2022*   Zoster (Shingles) Vaccine (2 of 2) 11/11/2022*   Medicare Annual Wellness Visit  08/11/2023   Cologuard (Stool DNA test)  08/21/2023   DTaP/Tdap/Td vaccine (2 - Td or Tdap) 02/18/2032   Pneumonia Vaccine  Completed   Flu Shot  Completed   Hepatitis C Screening: USPSTF Recommendation to screen - Ages 14-79 yo.  Completed   HPV Vaccine  Aged Out   HIV Screening  Discontinued  *Topic was postponed. The date shown is not the original due date.    Advanced directives: End of life planning; Advance aging; Advanced directives discussed.  Copy of current HCPOA/Living Will requested.    Conditions/risks identified: none new  Next appointment: Follow up in one year for your annual wellness visit.   Preventive Care 62 Years and Older, Male  Preventive care refers to lifestyle choices and visits with your health care provider that can promote health and wellness. What does preventive care include? A yearly physical exam. This is also called an annual well check. Dental exams once or twice a year. Routine eye exams. Ask your health care provider how often you should have your eyes checked. Personal lifestyle choices, including: Daily care of your teeth and gums. Regular physical activity. Eating a healthy diet. Avoiding tobacco and drug use. Limiting alcohol use. Practicing safe  sex. Taking low doses of aspirin every day. Taking vitamin and mineral supplements as recommended by your health care provider. What happens during an annual well check? The services and screenings done by your health care provider during your annual well check will depend on your age, overall health, lifestyle risk factors, and family history of disease. Counseling  Your health care provider may ask you questions about your: Alcohol use. Tobacco use. Drug use. Emotional well-being. Home and relationship well-being. Sexual activity. Eating habits. History of falls. Memory and ability to understand (cognition). Work and work Statistician. Screening  You may have the following tests or measurements: Height, weight, and BMI. Blood pressure. Lipid and cholesterol levels. These may be checked every 5 years, or more frequently if you are over 47 years old. Skin check. Lung cancer screening. You may have this screening every year starting at age 101 if you have a 30-pack-year history of smoking and currently smoke or have quit within the past 15 years. Fecal occult blood test (FOBT) of the stool. You may have this test every year starting at age 66. Flexible sigmoidoscopy or colonoscopy. You may have a sigmoidoscopy every 5 years or a colonoscopy every 10 years starting at age 14. Prostate cancer screening. Recommendations will vary depending on your family history and other risks. Hepatitis C blood test. Hepatitis B blood test. Sexually transmitted disease (STD) testing. Diabetes screening. This is done by checking  your blood sugar (glucose) after you have not eaten for a while (fasting). You may have this done every 1-3 years. Abdominal aortic aneurysm (AAA) screening. You may need this if you are a current or former smoker. Osteoporosis. You may be screened starting at age 87 if you are at high risk. Talk with your health care provider about your test results, treatment options, and if  necessary, the need for more tests. Vaccines  Your health care provider may recommend certain vaccines, such as: Influenza vaccine. This is recommended every year. Tetanus, diphtheria, and acellular pertussis (Tdap, Td) vaccine. You may need a Td booster every 10 years. Zoster vaccine. You may need this after age 25. Pneumococcal 13-valent conjugate (PCV13) vaccine. One dose is recommended after age 63. Pneumococcal polysaccharide (PPSV23) vaccine. One dose is recommended after age 65. Talk to your health care provider about which screenings and vaccines you need and how often you need them. This information is not intended to replace advice given to you by your health care provider. Make sure you discuss any questions you have with your health care provider. Document Released: 06/19/2015 Document Revised: 02/10/2016 Document Reviewed: 03/24/2015 Elsevier Interactive Patient Education  2017 [place] Prevention in the Home Falls can cause injuries. They can happen to people of all ages. There are many things you can do to make your home safe and to help prevent falls. What can I do on the outside of my home? Regularly fix the edges of walkways and driveways and fix any cracks. Remove anything that might make you trip as you walk through a door, such as a raised step or threshold. Trim any bushes or trees on the path to your home. Use bright outdoor lighting. Clear any walking paths of anything that might make someone trip, such as rocks or tools. Regularly check to see if handrails are loose or broken. Make sure that both sides of any steps have handrails. Any raised decks and porches should have guardrails on the edges. Have any leaves, snow, or ice cleared regularly. Use sand or salt on walking paths during winter. Clean up any spills in your garage right away. This includes oil or grease spills. What can I do in the bathroom? Use night lights. Install grab bars by the toilet  and in the tub and shower. Do not use towel bars as grab bars. Use non-skid mats or decals in the tub or shower. If you need to sit down in the shower, use a plastic, non-slip stool. Keep the floor dry. Clean up any water that spills on the floor as soon as it happens. Remove soap buildup in the tub or shower regularly. Attach bath mats securely with double-sided non-slip rug tape. Do not have throw rugs and other things on the floor that can make you trip. What can I do in the bedroom? Use night lights. Make sure that you have a light by your bed that is easy to reach. Do not use any sheets or blankets that are too big for your bed. They should not hang down onto the floor. Have a firm chair that has side arms. You can use this for support while you get dressed. Do not have throw rugs and other things on the floor that can make you trip. What can I do in the kitchen? Clean up any spills right away. Avoid walking on wet floors. Keep items that you use a lot in easy-to-reach places. If you need to reach something  above you, use a strong step stool that has a grab bar. Keep electrical cords out of the way. Do not use floor polish or wax that makes floors slippery. If you must use wax, use non-skid floor wax. Do not have throw rugs and other things on the floor that can make you trip. What can I do with my stairs? Do not leave any items on the stairs. Make sure that there are handrails on both sides of the stairs and use them. Fix handrails that are broken or loose. Make sure that handrails are as long as the stairways. Check any carpeting to make sure that it is firmly attached to the stairs. Fix any carpet that is loose or worn. Avoid having throw rugs at the top or bottom of the stairs. If you do have throw rugs, attach them to the floor with carpet tape. Make sure that you have a light switch at the top of the stairs and the bottom of the stairs. If you do not have them, ask someone to add  them for you. What else can I do to help prevent falls? Wear shoes that: Do not have high heels. Have rubber bottoms. Are comfortable and fit you well. Are closed at the toe. Do not wear sandals. If you use a stepladder: Make sure that it is fully opened. Do not climb a closed stepladder. Make sure that both sides of the stepladder are locked into place. Ask someone to hold it for you, if possible. Clearly mark and make sure that you can see: Any grab bars or handrails. First and last steps. Where the edge of each step is. Use tools that help you move around (mobility aids) if they are needed. These include: Canes. Walkers. Scooters. Crutches. Turn on the lights when you go into a dark area. Replace any light bulbs as soon as they burn out. Set up your furniture so you have a clear path. Avoid moving your furniture around. If any of your floors are uneven, fix them. If there are any pets around you, be aware of where they are. Review your medicines with your doctor. Some medicines can make you feel dizzy. This can increase your chance of falling. Ask your doctor what other things that you can do to help prevent falls. This information is not intended to replace advice given to you by your health care provider. Make sure you discuss any questions you have with your health care provider. Document Released: 03/19/2009 Document Revised: 10/29/2015 Document Reviewed: 06/27/2014 Elsevier Interactive Patient Education  2017 [place]. Opioid Pain Medicine Management Opioids are powerful medicines that are used to treat moderate to severe pain. When used for short periods of time, they can help you to: Sleep better. Do better in physical or occupational therapy. Feel better in the first few days after an injury. Recover from surgery. Opioids should be taken with the supervision of a trained health care provider. They should be taken for the shortest period of time possible. This is  because opioids can be addictive, and the longer you take opioids, the greater your risk of addiction. This addiction can also be called opioid use disorder. What are the risks? Using opioid pain medicines for longer than 3 days increases your risk of side effects. Side effects include: Constipation. Nausea and vomiting. Breathing difficulties (respiratory depression). Drowsiness. Confusion. Opioid use disorder. Itching. Taking opioid pain medicine for a long period of time can affect your ability to do daily tasks. It also  puts you at risk for: Motor vehicle crashes. Depression. Suicide. Heart attack. Overdose, which can be life-threatening. What is a pain treatment plan? A pain treatment plan is an agreement between you and your health care provider. Pain is unique to each person, and treatments vary depending on your condition. To manage your pain, you and your health care provider need to work together. To help you do this: Discuss the goals of your treatment, including how much pain you might expect to have and how you will manage the pain. Review the risks and benefits of taking opioid medicines. Remember that a good treatment plan uses more than one approach and minimizes the chance of side effects. Be honest about the amount of medicines you take and about any drug or alcohol use. Get pain medicine prescriptions from only one health care provider. Pain can be managed with many types of alternative treatments. Ask your health care provider to refer you to one or more specialists who can help you manage pain through: Physical or occupational therapy. Counseling (cognitive behavioral therapy). Good nutrition. Biofeedback. Massage. Meditation. Non-opioid medicine. Following a gentle exercise program. How to use opioid pain medicine Taking medicine Take your pain medicine exactly as told by your health care provider. Take it only when you need it. If your pain gets less severe,  you may take less than your prescribed dose if your health care provider approves. If you are not having pain, do nottake pain medicine unless your health care provider tells you to take it. If your pain is severe, do nottry to treat it yourself by taking more pills than instructed on your prescription. Contact your health care provider for help. Write down the times when you take your pain medicine. It is easy to become confused while on pain medicine. Writing the time can help you avoid overdose. Take other over-the-counter or prescription medicines only as told by your health care provider. Keeping yourself and others safe  While you are taking opioid pain medicine: Do not drive, use machinery, or power tools. Do not sign legal documents. Do not drink alcohol. Do not take sleeping pills. Do not supervise children by yourself. Do not do activities that require climbing or being in high places. Do not go to a lake, river, ocean, spa, or swimming pool. Do not share your pain medicine with anyone. Keep pain medicine in a locked cabinet or in a secure area where pets and children cannot reach it. Stopping your use of opioids If you have been taking opioid medicine for more than a few weeks, you may need to slowly decrease (taper) how much you take until you stop completely. Tapering your use of opioids can decrease your risk of symptoms of withdrawal, such as: Pain and cramping in the abdomen. Nausea. Sweating. Sleepiness. Restlessness. Uncontrollable shaking (tremors). Cravings for the medicine. Do not attempt to taper your use of opioids on your own. Talk with your health care provider about how to do this. Your health care provider may prescribe a step-down schedule based on how much medicine you are taking and how long you have been taking it. Getting rid of leftover pills Do not save any leftover pills. Get rid of leftover pills safely by: Taking the medicine to a prescription  take-back program. This is usually offered by the county or law enforcement. Bringing them to a pharmacy that has a drug disposal container. Flushing them down the toilet. Check the label or package insert of your medicine to see  whether this is safe to do. Throwing them out in the trash. Check the label or package insert of your medicine to see whether this is safe to do. If it is safe to throw it out, remove the medicine from the original container, put it into a sealable bag or container, and mix it with used coffee grounds, food scraps, dirt, or cat litter before putting it in the trash. Follow these instructions at home: Activity Do exercises as told by your health care provider. Avoid activities that make your pain worse. Return to your normal activities as told by your health care provider. Ask your health care provider what activities are safe for you. General instructions You may need to take these actions to prevent or treat constipation: Drink enough fluid to keep your urine pale yellow. Take over-the-counter or prescription medicines. Eat foods that are high in fiber, such as beans, whole grains, and fresh fruits and vegetables. Limit foods that are high in fat and processed sugars, such as fried or sweet foods. Keep all follow-up visits. This is important. Where to find support If you have been taking opioids for a long time, you may benefit from receiving support for quitting from a local support group or counselor. Ask your health care provider for a referral to these resources in your area. Where to find more information Centers for Disease Control and Prevention (CDC): http://www.wolf.info/ U.S. Food and Drug Administration (FDA): [place] Get help right away if: You may have taken too much of an opioid (overdosed). Common symptoms of an overdose: Your breathing is slower or more shallow than normal. You have a very slow heartbeat (pulse). You have slurred speech. You have nausea  and vomiting. Your pupils become very small. You have other potential symptoms: You are very confused. You faint or feel like you will faint. You have cold, clammy skin. You have blue lips or fingernails. You have thoughts of harming yourself or harming others. These symptoms may represent a serious problem that is an emergency. Do not wait to see if the symptoms will go away. Get medical help right away. Call your local emergency services (911 in the [place] Do not drive yourself to the hospital.  If you ever feel like you may hurt yourself or others, or have thoughts about taking your own life, get help right away. Go to your nearest emergency department or: Call your local emergency services (911 in the [place] Call the [facility] 772-427-6756 in the [place] Call a suicide crisis helpline, such as the [facility] at 616-829-0867 or 988 in the [place]. This is open 24 hours a day in the [place] Text the Crisis Text Line at 548-259-8597 (in the [place].). Summary Opioid medicines can help you manage moderate to severe pain for a short period of time. A pain treatment plan is an agreement between you and your health care provider. Discuss the goals of your treatment, including how much pain you might expect to have and how you will manage the pain. If you think that you or someone else may have taken too much of an opioid, get medical help right away. This information is not intended to replace advice given to you by your health care provider. Make sure you discuss any questions you have with your health care provider. Document Revised: 12/16/2020 Document Reviewed: 09/02/2020 Elsevier Patient Education  [place].

## 2022-08-21 ENCOUNTER — Other Ambulatory Visit: Payer: Self-pay | Admitting: Family Medicine

## 2022-08-21 DIAGNOSIS — M170 Bilateral primary osteoarthritis of knee: Secondary | ICD-10-CM

## 2022-08-22 MED ORDER — TRAMADOL HCL 50 MG PO TABS: ORAL_TABLET | ORAL | 0 refills | Status: DC

## 2022-08-23 ENCOUNTER — Telehealth: Payer: Self-pay | Admitting: Physician Assistant

## 2022-08-23 ENCOUNTER — Encounter: Payer: Self-pay | Admitting: Oncology

## 2022-08-23 NOTE — Telephone Encounter (Signed)
Pt called requesting to submit for bilateral knee gel injection. Past pt of Dr Durward Fortes with gel injections. Pt is also requesting April submit for the one gel injection and not the 3 series to insurance company. Please call pt about this matter at [phone].

## 2022-08-23 NOTE — Telephone Encounter (Signed)
VOB submitted for Monovisc, bilateral knee  

## 2022-08-24 ENCOUNTER — Encounter: Payer: Self-pay | Admitting: *Deleted

## 2022-08-24 ENCOUNTER — Telehealth: Payer: Self-pay

## 2022-08-24 DIAGNOSIS — Z006 Encounter for examination for normal comparison and control in clinical research program: Secondary | ICD-10-CM

## 2022-08-24 NOTE — Telephone Encounter (Signed)
Spoke with pt and let him know that we are aware of his concern and thanked him for making [place] aware.  Let him know that I'm working with IT to get the information removed.  Thanked him for making [place] aware.

## 2022-08-24 NOTE — Telephone Encounter (Signed)
PA required for Monovisc, bilateral knee. Faxed completed PA form to [place] at [phone]. PA pending

## 2022-08-24 NOTE — Research (Signed)
Spoke with Carlos Crosby to remind him of his appointment tomorrow at 0830.gave him the parking code. Voices understanding.

## 2022-08-25 ENCOUNTER — Encounter: Payer: Medicare (Managed Care) | Admitting: *Deleted

## 2022-08-25 DIAGNOSIS — Z006 Encounter for examination for normal comparison and control in clinical research program: Secondary | ICD-10-CM

## 2022-08-25 MED ORDER — STUDY - ESSENCE - OLEZARSEN 50 MG, 80 MG OR PLACEBO SQ INJECTION (PI-HILTY)
50.0000 mg | INJECTION | SUBCUTANEOUS | Status: DC
  Administered 2022-08-25: 50 mg via SUBCUTANEOUS
  Filled 2022-08-25: qty 0.5

## 2022-08-25 NOTE — Research (Signed)
     TREATMENT DAY 197 - STUDY WEEK 29    Subject Number: [id number]             Randomization Number: [id number]           Date: 25-Aug-2022      [x] Vital Signs Collected - Blood Pressure:115/69 - Heart Rate:68 - Respiratory Rate:20 - Temperature:98.1 - Oxygen Saturation:100%   [x]  (abdominal pain only) since last visit  [x]  Assessment of ER Visits, Hospitalizations, and Inpatient Days  [x]  Adverse Events and Concomitant Medications  [x]  Diet, Lifestyle, and Alcohol Counseling   [x]  Study Drug: [place] Injection   Carlos Crosby is here for Week 29 Day 197 of [name] research. He reports no abd pain, no visits to the ED or urgent care, and no changes in his meds. Vs taken at 0835. Injection given in left lower abd at 0900 [place] well kit number [id number]. Scheduled next visit for April 12 at 0900.   Current Outpatient Medications:    acetaminophen (TYLENOL) 500 MG tablet, Take 1,000 mg by mouth every 6 (six) hours as needed., Disp: , Rfl:    apixaban (ELIQUIS) 5 MG TABS tablet, Take 1 tablet (5 mg total) by mouth 2 (two) times daily., Disp: 180 tablet, Rfl: 1   Cholecalciferol (VITAMIN D) 2000 units CAPS, daily. , Disp: , Rfl:    digoxin (LANOXIN) 0.25 MG tablet, TAKE 1 TABLET DAILY, Disp: 90 tablet, Rfl: 2   furosemide (LASIX) 20 MG tablet, Take 1 tablet (20 mg total) by mouth daily as needed for edema., Disp: 90 tablet, Rfl: 3   loratadine (CLARITIN) 10 MG tablet, Take 10 mg by mouth daily., Disp: , Rfl:    metoprolol succinate (TOPROL-XL) 100 MG 24 hr tablet, TAKE 2 TABLETS EVERY MORNING AND 1 TABLET EVERY EVENING, Disp: 270 tablet, Rfl: 1   Multiple Vitamins-Minerals (CENTRUM SILVER ADULT 50+) TABS, Take 1 tablet by mouth daily., Disp: , Rfl:    pravastatin (PRAVACHOL) 80 MG tablet, Take 1 tablet (80 mg total) by mouth every evening. PLEASE CALL TO SCHEDULE OFFICE VISIT FOR FURTHER REFILLS. THANK YOU!, Disp: 90 tablet, Rfl: 3   sacubitril-valsartan (ENTRESTO) 24-26 MG, Take 1 tablet by mouth 2  (two) times daily., Disp: 180 tablet, Rfl: 3   traMADol (ULTRAM) 50 MG tablet, TAKE 1 TABLET(50 MG) BY MOUTH EVERY 6 HOURS AS NEEDED FOR PAIN Strength: 50 mg, Disp: 90 tablet, Rfl: 0   verapamil (CALAN-SR) 120 MG CR tablet, TAKE 1 TABLET DAILY, Disp: 90 tablet, Rfl: 2  Current Facility-Administered Medications:    Study - [name] - olezarsen 50 mg, 80 mg or placebo SQ injection (PI-Hilty), 50 mg, Subcutaneous, Q28 days, Hilty, Nadean Corwin, MD, 50 mg at 08/25/22 0900

## 2022-08-25 NOTE — Progress Notes (Signed)
Order(s) created erroneously. Erroneous order ID: [id number]  Order canceled by: CHART CORRECTION ANALYST NINE, IDENTITY  Order cancel date/time: 08/25/2022 8:30 AM  Erroneous order ID: [id number]  Order canceled by: CHART CORRECTION ANALYST NINE, IDENTITY  Order cancel date/time: 08/25/2022 8:30 AM  Erroneous order ID: [id number]  Order canceled by: CHART CORRECTION ANALYST NINE, IDENTITY  Order cancel date/time: 08/25/2022 8:30 AM  Erroneous order ID: [id number]  Order canceled by: CHART CORRECTION ANALYST NINE, IDENTITY  Order cancel date/time: 08/25/2022 8:30 AM  Erroneous order ID: [id number]  Order canceled by: CHART CORRECTION ANALYST NINE, IDENTITY  Order cancel date/time: 08/25/2022 8:30 AM  Erroneous order ID: [id number]  Order canceled by: CHART CORRECTION ANALYST NINE, IDENTITY  Order cancel date/time: 08/25/2022 8:30 AM

## 2022-08-29 ENCOUNTER — Telehealth: Payer: Self-pay | Admitting: Physician Assistant

## 2022-08-29 NOTE — Telephone Encounter (Signed)
I called and scheduled pt.

## 2022-08-29 NOTE — Telephone Encounter (Signed)
Pt called stating he got his approval for gel injection. Referral approval is not on pt chart. Please call pt at [phone].

## 2022-09-08 ENCOUNTER — Ambulatory Visit (INDEPENDENT_AMBULATORY_CARE_PROVIDER_SITE_OTHER): Payer: Medicare (Managed Care) | Admitting: Physician Assistant

## 2022-09-08 ENCOUNTER — Encounter: Payer: Self-pay | Admitting: Physician Assistant

## 2022-09-08 DIAGNOSIS — M1711 Unilateral primary osteoarthritis, right knee: Secondary | ICD-10-CM | POA: Diagnosis not present

## 2022-09-08 DIAGNOSIS — M1712 Unilateral primary osteoarthritis, left knee: Secondary | ICD-10-CM | POA: Diagnosis not present

## 2022-09-08 DIAGNOSIS — M170 Bilateral primary osteoarthritis of knee: Secondary | ICD-10-CM

## 2022-09-08 MED ORDER — HYALURONAN 88 MG/4ML IX SOSY
88.0000 mg | PREFILLED_SYRINGE | INTRA_ARTICULAR | Status: AC | PRN
  Administered 2022-09-08: 88 mg via INTRA_ARTICULAR

## 2022-09-08 NOTE — Progress Notes (Signed)
Office Visit Note   Patient: Carlos Crosby           Date of Birth: 25-Dec-1955           MRN: [id number] Visit Date: 09/08/2022              Requested by: Leone Haven, MD [address] [place],  [place] [address] PCP: Leone Haven, MD  Chief Complaint  Patient presents with  . Left Knee - Pain  . Right Knee - Pain      HPI: [name] is a patient of Dr. Durward Fortes.  He has a history of end-stage bilateral arthritis.  He currently is not a candidate or interested in knee replacement surgery.  He comes in today for bilateral Monovisc injections  Assessment & Plan: Visit Diagnoses: Osteoarthritis bilateral knees  Plan: Went forward with injections today no complications may follow-up as needed.  If he does come to a point where he wants knee replacement will refer him to Dr. Ninfa Linden.  He would need updated x-rays  Follow-Up Instructions:   Ortho Exam  Patient is alert, oriented, no adenopathy, well-dressed, normal affect, normal respiratory effort. Bilateral knees no effusion no erythema no evidence of infection compartments are soft and nontender he is neurovascular intact  Imaging: No results found. No images are attached to the encounter.  Labs: Lab Results  Component Value Date   HGBA1C 5.4 07/08/2022   HGBA1C 5.4 03/29/2021   HGBA1C 5.3 02/25/2020   LABURIC 6.8 03/29/2021   LABURIC 7.8 07/11/2017     Lab Results  Component Value Date   ALBUMIN 4.7 07/12/2021   ALBUMIN 4.4 03/29/2021   ALBUMIN 4.4 10/11/2020    No results found for: "MG" Lab Results  Component Value Date   VD25OH 43.76 05/18/2018   VD25OH 32.32 01/15/2018   VD25OH 22.14 (L) 11/16/2017    No results found for: "PREALBUMIN"    Latest Ref Rng & Units 05/11/2022    1:01 PM 02/15/2022    2:05 PM 11/17/2021    1:57 PM  CBC EXTENDED  WBC 4.0 - 10.5 K/uL 8.0  7.3  7.3   RBC 4.22 - 5.81 MIL/uL 5.25  5.14  5.19   Hemoglobin 13.0 - 17.0 g/dL 17.7  17.3  17.7   HCT  39.0 - 52.0 % 50.2  49.7  50.4   Platelets 150 - 400 K/uL 198  199  205   NEUT# 1.7 - 7.7 K/uL 4.3  3.9  3.9   Lymph# 0.7 - 4.0 K/uL 2.7  2.4  2.5      There is no height or weight on file to calculate BMI.  Orders:  No orders of the defined types were placed in this encounter.  No orders of the defined types were placed in this encounter.    Procedures: Large Joint Inj: bilateral knee on 09/08/2022 8:43 AM Indications: pain and diagnostic evaluation Details: 22 G 1.5 in needle  Arthrogram: No  Medications (Right): 88 mg Hyaluronan 88 MG/4ML Medications (Left): 88 mg Hyaluronan 88 MG/4ML Outcome: tolerated well, no immediate complications Procedure, treatment alternatives, risks and benefits explained, specific risks discussed. Consent was given by the patient. Immediately prior to procedure a time out was called to verify the correct patient, procedure, equipment, support staff and site/side marked as required. Patient was prepped and draped in the usual sterile fashion.    Clinical Data: No additional findings.  ROS:  All other systems negative, except as noted  in the HPI. Review of Systems  Objective: Vital Signs: There were no vitals taken for this visit.  Specialty Comments:  No specialty comments available.  PMFS History: Patient Active Problem List   Diagnosis Date Noted  . Elevated MCV 10/19/2021  . Submandibular swelling 09/28/2021  . History of gout 03/29/2021  . Cough 10/09/2020  . Morbid (severe) obesity due to excess calories 09/29/2020  . Trigger finger, left ring finger 05/07/2020  . Chronic pain of left hand 04/15/2020  . Routine general medical examination at a health care facility 02/25/2020  . Rectal pain 10/18/2019  . Rash 10/08/2019  . Fall 02/05/2019  . Neuropathy 11/16/2017  . Prediabetes 11/16/2017  . Vitamin D deficiency 11/16/2017  . Hemochromatosis, hereditary 07/23/2017  . Bleeding nose 05/18/2017  . Allergic rhinitis 01/26/2017   . Blue nevus 05/16/2016  . Hair loss 05/16/2016  . Abdominal pain 05/16/2016  . Umbilical hernia without obstruction and without gangrene 05/16/2016  . Atrial fibrillation 09/26/2014  . Chronic diastolic congestive heart failure 09/26/2014  . Anxiety 09/26/2014  . Obstructive sleep apnea on CPAP 09/26/2014  . Essential hypertension 09/26/2014  . Hyperlipidemia 09/26/2014  . Osteoarthritis of both knees 09/26/2014  . Kidney stone 09/26/2014   Past Medical History:  Diagnosis Date  . Chronic a-fib   . Chronic systolic CHF (congestive heart failure)   . Depression   . Edema 09/26/2014  . Hemochromatosis   . History of kidney stones   . History of pulmonary embolism   . Hyperlipidemia   . Hypertension   . Migraine   . Obesity   . OSA (obstructive sleep apnea)    on CPAP  . Osteoarthritis    bilateral knees  . [place] spotted fever 2011    Family History  Problem Relation Age of Onset  . Thyroid disease Mother   . Cancer Father        lung  . Arthritis Father   . Hypertension Father   . Cancer Paternal Grandfather        Throat cancer    Past Surgical History:  Procedure Laterality Date  . CARDIAC CATHETERIZATION  2012   [facility]  . KNEE SURGERY     right knee   . LITHOTRIPSY  07/31/2014  . URETERAL STENT PLACEMENT  2016  . URETEROSCOPY     Social History   Occupational History  . Not on file  Tobacco Use  . Smoking status: Never  . Smokeless tobacco: Never  Vaping Use  . Vaping Use: Never used  Substance and Sexual Activity  . Alcohol use: No    Comment: occasional; 3 times a year  . Drug use: No  . Sexual activity: Not Currently

## 2022-09-12 ENCOUNTER — Other Ambulatory Visit: Payer: Self-pay | Admitting: *Deleted

## 2022-09-12 ENCOUNTER — Other Ambulatory Visit: Payer: Self-pay

## 2022-09-12 ENCOUNTER — Inpatient Hospital Stay: Payer: Medicare (Managed Care) | Attending: Oncology

## 2022-09-12 LAB — CBC WITH DIFFERENTIAL (CANCER CENTER ONLY)
Abs Immature Granulocytes: 0.03 10*3/uL (ref 0.00–0.07)
Basophils Absolute: 0.1 10*3/uL (ref 0.0–0.1)
Basophils Relative: 1 %
Eosinophils Absolute: 0.1 10*3/uL (ref 0.0–0.5)
Eosinophils Relative: 2 %
HCT: 50.9 % (ref 39.0–52.0)
Hemoglobin: 17.5 g/dL — ABNORMAL HIGH (ref 13.0–17.0)
Immature Granulocytes: 0 %
Lymphocytes Relative: 32 %
Lymphs Abs: 2.4 10*3/uL (ref 0.7–4.0)
MCH: 33.1 pg (ref 26.0–34.0)
MCHC: 34.4 g/dL (ref 30.0–36.0)
MCV: 96.2 fL (ref 80.0–100.0)
Monocytes Absolute: 0.6 10*3/uL (ref 0.1–1.0)
Monocytes Relative: 8 %
Neutro Abs: 4.2 10*3/uL (ref 1.7–7.7)
Neutrophils Relative %: 57 %
Platelet Count: 190 10*3/uL (ref 150–400)
RBC: 5.29 MIL/uL (ref 4.22–5.81)
RDW: 12.8 % (ref 11.5–15.5)
WBC Count: 7.4 10*3/uL (ref 4.0–10.5)
nRBC: 0 % (ref 0.0–0.2)

## 2022-09-12 LAB — FERRITIN: Ferritin: 97 ng/mL (ref 24–336)

## 2022-09-13 ENCOUNTER — Other Ambulatory Visit: Payer: Self-pay | Admitting: Oncology

## 2022-09-13 ENCOUNTER — Inpatient Hospital Stay: Payer: Medicare (Managed Care)

## 2022-09-13 NOTE — Patient Instructions (Signed)

## 2022-09-13 NOTE — Progress Notes (Signed)
Ferritin 97. Parameters state hold phlebotomy if <100 but per Consuello Masse NP proceed with phlebotomy today. Pt agreeable

## 2022-09-14 ENCOUNTER — Other Ambulatory Visit: Payer: Self-pay

## 2022-09-14 DIAGNOSIS — M170 Bilateral primary osteoarthritis of knee: Secondary | ICD-10-CM

## 2022-09-15 ENCOUNTER — Encounter: Payer: Self-pay | Admitting: *Deleted

## 2022-09-15 DIAGNOSIS — Z006 Encounter for examination for normal comparison and control in clinical research program: Secondary | ICD-10-CM

## 2022-09-15 NOTE — Research (Signed)
Spoke with Carlos Crosby to remind him of his appointment tomorrow at 0900, gave him the parking code.

## 2022-09-16 ENCOUNTER — Encounter: Payer: Medicare (Managed Care) | Admitting: *Deleted

## 2022-09-16 ENCOUNTER — Other Ambulatory Visit: Payer: Self-pay

## 2022-09-16 DIAGNOSIS — Z006 Encounter for examination for normal comparison and control in clinical research program: Secondary | ICD-10-CM

## 2022-09-16 MED ORDER — STUDY - ESSENCE - OLEZARSEN 50 MG, 80 MG OR PLACEBO SQ INJECTION (PI-HILTY)
50.0000 mg | INJECTION | SUBCUTANEOUS | Status: DC
  Administered 2022-09-16: 50 mg via SUBCUTANEOUS
  Filled 2022-09-16: qty 0.5

## 2022-09-16 MED ORDER — STUDY - ESSENCE - OLEZARSEN 50 MG, 80 MG OR PLACEBO SQ INJECTION (PI-HILTY): 50.0000 mg | INJECTION | SUBCUTANEOUS | Status: DC

## 2022-09-16 NOTE — Research (Signed)
     TREATMENT DAY 225 - STUDY WEEK 33    Subject Number: [id number]              Randomization Number: [id number]             Date: 16-September-2022      [x] Vital Signs Collected - Blood Pressure:117/80 - Heart Rate:82 - Respiratory Rate:20 - Temperature:97.3 - Oxygen Saturation:98%   [x]   (abdominal pain only) since last visit  [x]  Assessment of ER Visits, Hospitalizations, and Inpatient Days  [x]  Adverse Events and Concomitant Medications  [x]  Diet, Lifestyle, and Alcohol Counseling   [x]  Study Drug: [place] Injection    Mr Muska is here for Week 33 day 225 of [name] research study. He reports no abd pain, no visits to the Ed or Urgent care since last seen. No changes in meds. VS taken at 0857. Injection given in left lower abd at 0930 tol well. Kit number [id number]. Scheduled next visit for May 10 at 0900.   Current Outpatient Medications:    acetaminophen (TYLENOL) 500 MG tablet, Take 1,000 mg by mouth every 6 (six) hours as needed., Disp: , Rfl:    apixaban (ELIQUIS) 5 MG TABS tablet, Take 1 tablet (5 mg total) by mouth 2 (two) times daily., Disp: 180 tablet, Rfl: 1   Cholecalciferol (VITAMIN D) 2000 units CAPS, daily. , Disp: , Rfl:    digoxin (LANOXIN) 0.25 MG tablet, TAKE 1 TABLET DAILY, Disp: 90 tablet, Rfl: 2   furosemide (LASIX) 20 MG tablet, Take 1 tablet (20 mg total) by mouth daily as needed for edema., Disp: 90 tablet, Rfl: 3   loratadine (CLARITIN) 10 MG tablet, Take 10 mg by mouth daily., Disp: , Rfl:    metoprolol succinate (TOPROL-XL) 100 MG 24 hr tablet, TAKE 2 TABLETS EVERY MORNING AND 1 TABLET EVERY EVENING, Disp: 270 tablet, Rfl: 1   Multiple Vitamins-Minerals (CENTRUM SILVER ADULT 50+) TABS, Take 1 tablet by mouth daily., Disp: , Rfl:    pravastatin (PRAVACHOL) 80 MG tablet, Take 1 tablet (80 mg total) by mouth every evening. PLEASE CALL TO SCHEDULE OFFICE VISIT FOR FURTHER REFILLS. THANK YOU!, Disp: 90 tablet, Rfl: 3   sacubitril-valsartan (ENTRESTO) 24-26 MG, Take 1  tablet by mouth 2 (two) times daily., Disp: 180 tablet, Rfl: 3   traMADol (ULTRAM) 50 MG tablet, TAKE 1 TABLET(50 MG) BY MOUTH EVERY 6 HOURS AS NEEDED FOR PAIN Strength: 50 mg, Disp: 90 tablet, Rfl: 0   verapamil (CALAN-SR) 120 MG CR tablet, TAKE 1 TABLET DAILY, Disp: 90 tablet, Rfl: 2  Current Facility-Administered Medications:    Study - [name] - olezarsen 50 mg, 80 mg or placebo SQ injection (PI-Hilty), 50 mg, Subcutaneous, Q28 days, Hilty, Lisette Abu, MD

## 2022-09-22 ENCOUNTER — Encounter: Payer: Self-pay | Admitting: Cardiovascular Disease

## 2022-09-22 ENCOUNTER — Other Ambulatory Visit: Payer: Self-pay

## 2022-09-22 DIAGNOSIS — I4891 Unspecified atrial fibrillation: Secondary | ICD-10-CM

## 2022-09-22 MED ORDER — APIXABAN 5 MG PO TABS: 5.0000 mg | ORAL_TABLET | Freq: Two times a day (BID) | ORAL | 1 refills | Status: DC

## 2022-09-26 ENCOUNTER — Encounter: Payer: Self-pay | Admitting: Family Medicine

## 2022-09-26 ENCOUNTER — Other Ambulatory Visit: Payer: Self-pay | Admitting: Family Medicine

## 2022-09-26 DIAGNOSIS — M170 Bilateral primary osteoarthritis of knee: Secondary | ICD-10-CM

## 2022-09-26 MED ORDER — TRAMADOL HCL 50 MG PO TABS: ORAL_TABLET | ORAL | 0 refills | Status: DC

## 2022-09-27 DIAGNOSIS — G4733 Obstructive sleep apnea (adult) (pediatric): Secondary | ICD-10-CM | POA: Diagnosis not present

## 2022-10-13 ENCOUNTER — Encounter: Payer: Self-pay | Admitting: *Deleted

## 2022-10-13 DIAGNOSIS — Z006 Encounter for examination for normal comparison and control in clinical research program: Secondary | ICD-10-CM

## 2022-10-13 NOTE — Research (Signed)
Spoke with Carlos Crosby to remind him of his appointment tomorrow at 0900. Gave him the parking code and reminded him to be NPO. Voices understanding.

## 2022-10-14 ENCOUNTER — Other Ambulatory Visit: Payer: Self-pay

## 2022-10-14 ENCOUNTER — Ambulatory Visit: Payer: Medicare (Managed Care) | Admitting: Family Medicine

## 2022-10-14 ENCOUNTER — Other Ambulatory Visit: Payer: Self-pay | Admitting: Cardiovascular Disease

## 2022-10-14 ENCOUNTER — Encounter: Payer: Medicare (Managed Care) | Admitting: *Deleted

## 2022-10-14 DIAGNOSIS — Z006 Encounter for examination for normal comparison and control in clinical research program: Secondary | ICD-10-CM

## 2022-10-14 MED ORDER — STUDY - ESSENCE - OLEZARSEN 50 MG, 80 MG OR PLACEBO SQ INJECTION (PI-HILTY)
50.0000 mg | INJECTION | SUBCUTANEOUS | Status: DC
  Administered 2022-10-14: 50 mg via SUBCUTANEOUS
  Filled 2022-10-14: qty 0.5

## 2022-10-14 NOTE — Research (Addendum)
Carlos Crosby Week 37 day 253 [name] 14-Oct-2022         Coagulation: Prothrombin Time (PT) 26.6 Sec                   [] Clinically Significant  [x] Not Clinically Significant International Normalized Ratio (INR) 2.5       [] Clinically Significant  [x] Not Clinically Significant Activated Partial Thromboplastin (APTT)53.1          [] Clinically Significant  [x] Not Clinically Significant   Urine Chemistry: Urine Albumin 5.24 mg/dL                          [] Clinically Significant  [x] Not Clinically Significant Albumin Creatinine Ratio 124 mg/g            [] Clinically Significant  [x] Not Clinically Significant    He takes Eliquis 10 mg daily  28-Jul-2022 Urine Albumin was 3.87 28-Jul-2022 Albumin Creat. Ratio was 49  Any further action needed to be taken per the PI? NO  Chrystie Nose, MD, [facility], FACP  [place]  [name]  Medical Director of the Advanced Lipid Disorders &  Cardiovascular Risk Reduction Clinic Diplomate of the American Board of Clinical Lipidology Attending Cardiologist  Direct Dial: [phone]  Fax: [phone]  Website:  [email or web address]               Hematology: MCV 98.5 fL                                           [] Clinically Significant  [x] Not Clinically Significant   Urinalysis: Leukocyte esterase 25                            [] Clinically Significant  [x] Not Clinically Significant Urinary White Blood Cells 6-9 per HPF   [] Clinically Significant  [x] Not Clinically Significant Mucus 1+ per HPF                                  [] Clinically Significant  [x] Not Clinically Significant   Urine Chemistry: Protein Creatinine Ratio 236 mg/g           [] Clinically Significant  [x] Not Clinically Significant      Any further action needed to be taken per the PI?  No  Chrystie Nose, MD, [facility], FACP  [place]  [facility]  Medical Director of the Advanced Lipid Disorders &  Cardiovascular Risk Reduction  Clinic Diplomate of the American Board of Clinical Lipidology Attending Cardiologist  Direct Dial: [phone]  Fax: [phone]  Website:  [email or web address]    TREATMENT DAY 253 - STUDY WEEK 37    Subject Number: [id number]              Randomization Number: [id number]             Date:14-Oct-2022      [x] Vital Signs Collected - Blood Pressure:113/60 - Heart Rate:70  - Respiratory Rate:18 - Temperature:97.2 - Oxygen Saturation:99%   [x]  Extended Urinalysis   [x]  Lab collection per protocol  [x]  (abdominal pain only) since last visit  [x]  Assessment of ER Visits, Hospitalizations, and Inpatient Days  [x]  Adverse Events and Concomitant Medications  [x]  Diet, Lifestyle, and Alcohol Counseling   [x]  Study Drug: [place] Injection   Carlos  Crosby is here for Week 37 Day 253 of [name] research. He reports no abd pain, no changes in his meds, and no visits to the Ed or Urgent care since last seen.  Vs taken at 0854. Blood drawn at 0900. Urine obtained at 0935. Injection given in left lower abd at 0850. Tol well kit number [id number]. Scheduled next appointment for June 7th at 0900.   Current Outpatient Medications:    acetaminophen (TYLENOL) 500 MG tablet, Take 1,000 mg by mouth every 6 (six) hours as needed., Disp: , Rfl:    apixaban (ELIQUIS) 5 MG TABS tablet, Take 1 tablet (5 mg total) by mouth 2 (two) times daily., Disp: 180 tablet, Rfl: 1   Cholecalciferol (VITAMIN D) 2000 units CAPS, daily. , Disp: , Rfl:    digoxin (LANOXIN) 0.25 MG tablet, TAKE 1 TABLET DAILY, Disp: 90 tablet, Rfl: 2   furosemide (LASIX) 20 MG tablet, Take 1 tablet (20 mg total) by mouth daily as needed for edema., Disp: 90 tablet, Rfl: 3   loratadine (CLARITIN) 10 MG tablet, Take 10 mg by mouth daily., Disp: , Rfl:    metoprolol succinate (TOPROL-XL) 100 MG 24 hr tablet, TAKE 2 TABLETS EVERY MORNING AND 1 TABLET EVERY EVENING, Disp: 270 tablet, Rfl: 1   Multiple Vitamins-Minerals (CENTRUM SILVER ADULT 50+) TABS, Take  1 tablet by mouth daily., Disp: , Rfl:    pravastatin (PRAVACHOL) 80 MG tablet, Take 1 tablet (80 mg total) by mouth daily., Disp: 90 tablet, Rfl: 0   sacubitril-valsartan (ENTRESTO) 24-26 MG, Take 1 tablet by mouth 2 (two) times daily., Disp: 180 tablet, Rfl: 3   traMADol (ULTRAM) 50 MG tablet, TAKE 1 TABLET(50 MG) BY MOUTH EVERY 6 HOURS AS NEEDED FOR PAIN, Disp: 90 tablet, Rfl: 0   verapamil (CALAN-SR) 120 MG CR tablet, TAKE 1 TABLET DAILY, Disp: 90 tablet, Rfl: 2  Current Facility-Administered Medications:    Study - [name] - olezarsen 50 mg, 80 mg or placebo SQ injection (PI-Hilty), 50 mg, Subcutaneous, Q28 days, Hilty, Lisette Abu, MD

## 2022-10-17 ENCOUNTER — Encounter: Payer: Self-pay | Admitting: Family Medicine

## 2022-10-17 ENCOUNTER — Ambulatory Visit (INDEPENDENT_AMBULATORY_CARE_PROVIDER_SITE_OTHER): Payer: Medicare (Managed Care) | Admitting: Family Medicine

## 2022-10-17 VITALS — BP 128/80 | HR 92 | Temp 98.1°F | Ht 71.0 in | Wt 318.8 lb

## 2022-10-17 DIAGNOSIS — D239 Other benign neoplasm of skin, unspecified: Secondary | ICD-10-CM | POA: Diagnosis not present

## 2022-10-17 DIAGNOSIS — L570 Actinic keratosis: Secondary | ICD-10-CM

## 2022-10-17 DIAGNOSIS — G4733 Obstructive sleep apnea (adult) (pediatric): Secondary | ICD-10-CM | POA: Diagnosis not present

## 2022-10-17 DIAGNOSIS — M170 Bilateral primary osteoarthritis of knee: Secondary | ICD-10-CM | POA: Diagnosis not present

## 2022-10-17 DIAGNOSIS — I5032 Chronic diastolic (congestive) heart failure: Secondary | ICD-10-CM | POA: Diagnosis not present

## 2022-10-17 LAB — COMPREHENSIVE METABOLIC PANEL
ALT: 16 U/L (ref 0–53)
AST: 17 U/L (ref 0–37)
Albumin: 4.3 g/dL (ref 3.5–5.2)
Alkaline Phosphatase: 49 U/L (ref 39–117)
BUN: 19 mg/dL (ref 6–23)
CO2: 27 mEq/L (ref 19–32)
Calcium: 9.9 mg/dL (ref 8.4–10.5)
Chloride: 101 mEq/L (ref 96–112)
Creatinine, Ser: 1.04 mg/dL (ref 0.40–1.50)
GFR: 75.23 mL/min (ref >60.00)
Glucose, Bld: 108 mg/dL — ABNORMAL HIGH (ref 70–99)
Potassium: 4.4 mEq/L (ref 3.5–5.1)
Sodium: 139 mEq/L (ref 135–145)
Total Bilirubin: 1 mg/dL (ref 0.2–1.2)
Total Protein: 6.8 g/dL (ref 6.0–8.3)

## 2022-10-17 NOTE — Assessment & Plan Note (Signed)
Chronic issue.  Improving.  He can continue Tylenol over-the-counter and tramadol 50 mg every 6 hours as needed for pain.

## 2022-10-17 NOTE — Assessment & Plan Note (Signed)
Noted on the scalp.  Refer to dermatology.

## 2022-10-17 NOTE — Assessment & Plan Note (Signed)
Chronic issue.  Symptomatically well-controlled.  He will continue his CPAP.

## 2022-10-17 NOTE — Progress Notes (Signed)
Marikay Alar, MD Phone: [phone]  Carlos Crosby is a 67 y.o. male who presents today for f/u.  Arthritis: Patient continues to have pain in his knees though the pain is better after getting gel injections and losing some weight.  He takes tramadol only if his Tylenol does not help.  He notes a 22-day supply last up to 40 days.  He notes the swelling in his legs is down as well.  Obesity: Patient is taking to a 1200-calorie diet.  He is walking more and swims 2-3 times a week.  OSA: Patient has no hypersomnia.  He does wake well rested.  On exam I noted likely actinic keratoses on his scalp.  Also noted a small blue nevus.  Patient has a blue nevus has been present for quite some time and has not changed.  Social History   Tobacco Use  Smoking Status Never  Smokeless Tobacco Never    Current Outpatient Medications on File Prior to Visit  Medication Sig Dispense Refill   acetaminophen (TYLENOL) 500 MG tablet Take 1,000 mg by mouth every 6 (six) hours as needed.     apixaban (ELIQUIS) 5 MG TABS tablet Take 1 tablet (5 mg total) by mouth 2 (two) times daily. 180 tablet 1   Cholecalciferol (VITAMIN D) 2000 units CAPS daily.      digoxin (LANOXIN) 0.25 MG tablet TAKE 1 TABLET DAILY 90 tablet 2   furosemide (LASIX) 20 MG tablet Take 1 tablet (20 mg total) by mouth daily as needed for edema. 90 tablet 3   loratadine (CLARITIN) 10 MG tablet Take 10 mg by mouth daily.     metoprolol succinate (TOPROL-XL) 100 MG 24 hr tablet TAKE 2 TABLETS EVERY MORNING AND 1 TABLET EVERY EVENING 270 tablet 1   Multiple Vitamins-Minerals (CENTRUM SILVER ADULT 50+) TABS Take 1 tablet by mouth daily.     pravastatin (PRAVACHOL) 80 MG tablet Take 1 tablet (80 mg total) by mouth daily. 90 tablet 0   sacubitril-valsartan (ENTRESTO) 24-26 MG Take 1 tablet by mouth 2 (two) times daily. 180 tablet 3   traMADol (ULTRAM) 50 MG tablet TAKE 1 TABLET(50 MG) BY MOUTH EVERY 6 HOURS AS NEEDED FOR PAIN 90 tablet 0    verapamil (CALAN-SR) 120 MG CR tablet TAKE 1 TABLET DAILY 90 tablet 2   Current Facility-Administered Medications on File Prior to Visit  Medication Dose Route Frequency Provider Last Rate Last Admin   Study - [name] - olezarsen 50 mg, 80 mg or placebo SQ injection (PI-Hilty)  50 mg Subcutaneous Q28 days Chrystie Nose, MD   50 mg at 10/14/22 0950     ROS see history of present illness  Objective  Physical Exam Vitals:   10/17/22 1150  BP: 128/80  Pulse: 92  Temp: 98.1 F (36.7 C)  SpO2: 97%    BP Readings from Last 3 Encounters:  10/17/22 128/80  10/14/22 113/60  09/16/22 117/80   Wt Readings from Last 3 Encounters:  10/17/22 (!) 318 lb 12.8 oz (144.6 kg)  08/11/22 (!) 336 lb (152.4 kg)  07/28/22 (!) 336 lb 9.6 oz (152.7 kg)    Physical Exam Constitutional:      General: He is not in acute distress.    Appearance: He is not diaphoretic.  HENT:     Head:      Comments: Scattered actinic keratoses over his scalp Cardiovascular:     Rate and Rhythm: Normal rate and regular rhythm.     Heart sounds:  Normal heart sounds.  Pulmonary:     Effort: Pulmonary effort is normal.     Breath sounds: Normal breath sounds.  Skin:    General: Skin is warm and dry.  Neurological:     Mental Status: He is alert.      Assessment/Plan: Please see individual problem list.  Chronic diastolic congestive heart failure (HCC) -     Comprehensive metabolic panel  Obstructive sleep apnea on CPAP Assessment & Plan: Chronic issue.  Symptomatically well-controlled.  He will continue his CPAP.   Primary osteoarthritis of both knees Assessment & Plan: Chronic issue.  Improving.  He can continue Tylenol over-the-counter and tramadol 50 mg every 6 hours as needed for pain.   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  Noted on scalp.  Patient reports this is stable.  He will monitor.   Morbid (severe) obesity due to excess calories [facility]) Assessment & Plan: Chronic  issue.  Encouraged continued healthy diet and exercise.  Congratulated on weight loss.   Actinic keratoses Assessment & Plan: Noted on the scalp.  Refer to dermatology.  Orders: -     Ambulatory referral to Dermatology     Return in about 3 months (around 01/17/2023) for weight, pain f/u.   Marikay Alar, MD [facility]

## 2022-10-17 NOTE — Assessment & Plan Note (Signed)
Chronic issue.  Encouraged continued healthy diet and exercise.  Congratulated on weight loss.

## 2022-10-17 NOTE — Telephone Encounter (Signed)
Is pt okay to wait until 10/11/2022 to be seen by dermatology?

## 2022-10-17 NOTE — Assessment & Plan Note (Signed)
Chronic issue.  Noted on scalp.  Patient reports this is stable.  He will monitor.

## 2022-10-18 ENCOUNTER — Other Ambulatory Visit: Payer: Self-pay

## 2022-10-25 ENCOUNTER — Encounter: Payer: Self-pay | Admitting: Family Medicine

## 2022-11-01 ENCOUNTER — Other Ambulatory Visit: Payer: Self-pay | Admitting: Family Medicine

## 2022-11-01 ENCOUNTER — Encounter: Payer: Self-pay | Admitting: Family Medicine

## 2022-11-01 DIAGNOSIS — M170 Bilateral primary osteoarthritis of knee: Secondary | ICD-10-CM

## 2022-11-01 DIAGNOSIS — G4733 Obstructive sleep apnea (adult) (pediatric): Secondary | ICD-10-CM | POA: Diagnosis not present

## 2022-11-01 MED ORDER — TRAMADOL HCL 50 MG PO TABS
ORAL_TABLET | ORAL | 0 refills | Status: DC
Start: 2022-11-01 — End: 2022-12-13

## 2022-11-01 NOTE — Telephone Encounter (Signed)
It looks like the patient was referred to [facility] dermatology 2 weeks ago. He has not heard anything from them yet. Can you check on this for him?

## 2022-11-04 NOTE — Telephone Encounter (Signed)
Noted  

## 2022-11-11 ENCOUNTER — Other Ambulatory Visit: Payer: Self-pay

## 2022-11-11 ENCOUNTER — Encounter: Payer: Medicare (Managed Care) | Admitting: *Deleted

## 2022-11-11 DIAGNOSIS — Z006 Encounter for examination for normal comparison and control in clinical research program: Secondary | ICD-10-CM

## 2022-11-11 MED ORDER — STUDY - ESSENCE - OLEZARSEN 50 MG, 80 MG OR PLACEBO SQ INJECTION (PI-HILTY)
50.0000 mg | INJECTION | SUBCUTANEOUS | Status: DC
  Administered 2022-11-11: 50 mg via SUBCUTANEOUS
  Filled 2022-11-11: qty 0.5

## 2022-11-11 NOTE — Research (Signed)
     TREATMENT DAY  - STUDY WEEK     Subject Number: [id number]             Randomization Number: [id number]            Date:11-November-2022     [x] Vital Signs Collected - Blood Pressure:108/64 - Heart Rate:77 - Respiratory Rate:18 - Temperature:97.3 - Oxygen Saturation:97%   [x]  (abdominal pain only) since last visit  [x]  Assessment of ER Visits, Hospitalizations, and Inpatient Days  [x]  Adverse Events and Concomitant Medications  [x]  Diet, Lifestyle, and Alcohol Counseling   [x]  Study Drug: [place] Injection   Carlos Crosby is here for Week 41 day 281 of [name] research. He reports no abd pain and no visits to the ED or Urgent care since last seen. He states he feels good and has lost over 50 pounds. He does report adding Vit B12 to his meds.  No other changes noted with his meds  VS taken at 0909. Scheduled next visit for July 5 at 0900.Injection given at 0928 in left lower abd. Tol well. Kit number [id number].    Current Outpatient Medications:    acetaminophen (TYLENOL) 500 MG tablet, Take 1,000 mg by mouth every 6 (six) hours as needed., Disp: , Rfl:    apixaban (ELIQUIS) 5 MG TABS tablet, Take 1 tablet (5 mg total) by mouth 2 (two) times daily., Disp: 180 tablet, Rfl: 1   Cholecalciferol (VITAMIN D) 2000 units CAPS, daily. , Disp: , Rfl:    digoxin (LANOXIN) 0.25 MG tablet, TAKE 1 TABLET DAILY, Disp: 90 tablet, Rfl: 2   furosemide (LASIX) 20 MG tablet, Take 1 tablet (20 mg total) by mouth daily as needed for edema., Disp: 90 tablet, Rfl: 3   loratadine (CLARITIN) 10 MG tablet, Take 10 mg by mouth daily., Disp: , Rfl:    metoprolol succinate (TOPROL-XL) 100 MG 24 hr tablet, TAKE 2 TABLETS EVERY MORNING AND 1 TABLET EVERY EVENING, Disp: 270 tablet, Rfl: 1   Multiple Vitamins-Minerals (CENTRUM SILVER ADULT 50+) TABS, Take 1 tablet by mouth daily., Disp: , Rfl:    pravastatin (PRAVACHOL) 80 MG tablet, Take 1 tablet (80 mg total) by mouth daily., Disp: 90 tablet, Rfl: 0   sacubitril-valsartan  (ENTRESTO) 24-26 MG, Take 1 tablet by mouth 2 (two) times daily., Disp: 180 tablet, Rfl: 3   traMADol (ULTRAM) 50 MG tablet, TAKE 1 TABLET(50 MG) BY MOUTH EVERY 6 HOURS AS NEEDED FOR PAIN, Disp: 90 tablet, Rfl: 0   verapamil (CALAN-SR) 120 MG CR tablet, TAKE 1 TABLET DAILY, Disp: 90 tablet, Rfl: 2  Current Facility-Administered Medications:    Study - [name] - olezarsen 50 mg, 80 mg or placebo SQ injection (PI-Hilty), 50 mg, Subcutaneous, Q28 days, Chrystie Nose, MD, 50 mg at 11/11/22 [phone]

## 2022-11-27 ENCOUNTER — Encounter: Payer: Self-pay | Admitting: Cardiovascular Disease

## 2022-11-28 ENCOUNTER — Other Ambulatory Visit: Payer: Self-pay

## 2022-11-28 DIAGNOSIS — I4891 Unspecified atrial fibrillation: Secondary | ICD-10-CM

## 2022-11-28 MED ORDER — APIXABAN 5 MG PO TABS
5.0000 mg | ORAL_TABLET | Freq: Two times a day (BID) | ORAL | 1 refills | Status: DC
Start: 2022-11-28 — End: 2023-06-01

## 2022-12-01 IMAGING — CR DG CHEST 2V
1 series · 2 of 2 positions shown · non-contrast
Comparison: 02/08/2018

CLINICAL DATA: Cough and congestion

EXAM:
CHEST - 2 VIEW

[Series 1: dg chest 2 view · 0.14mm/px · 2 of 2 slices shown]
[im 1/2]
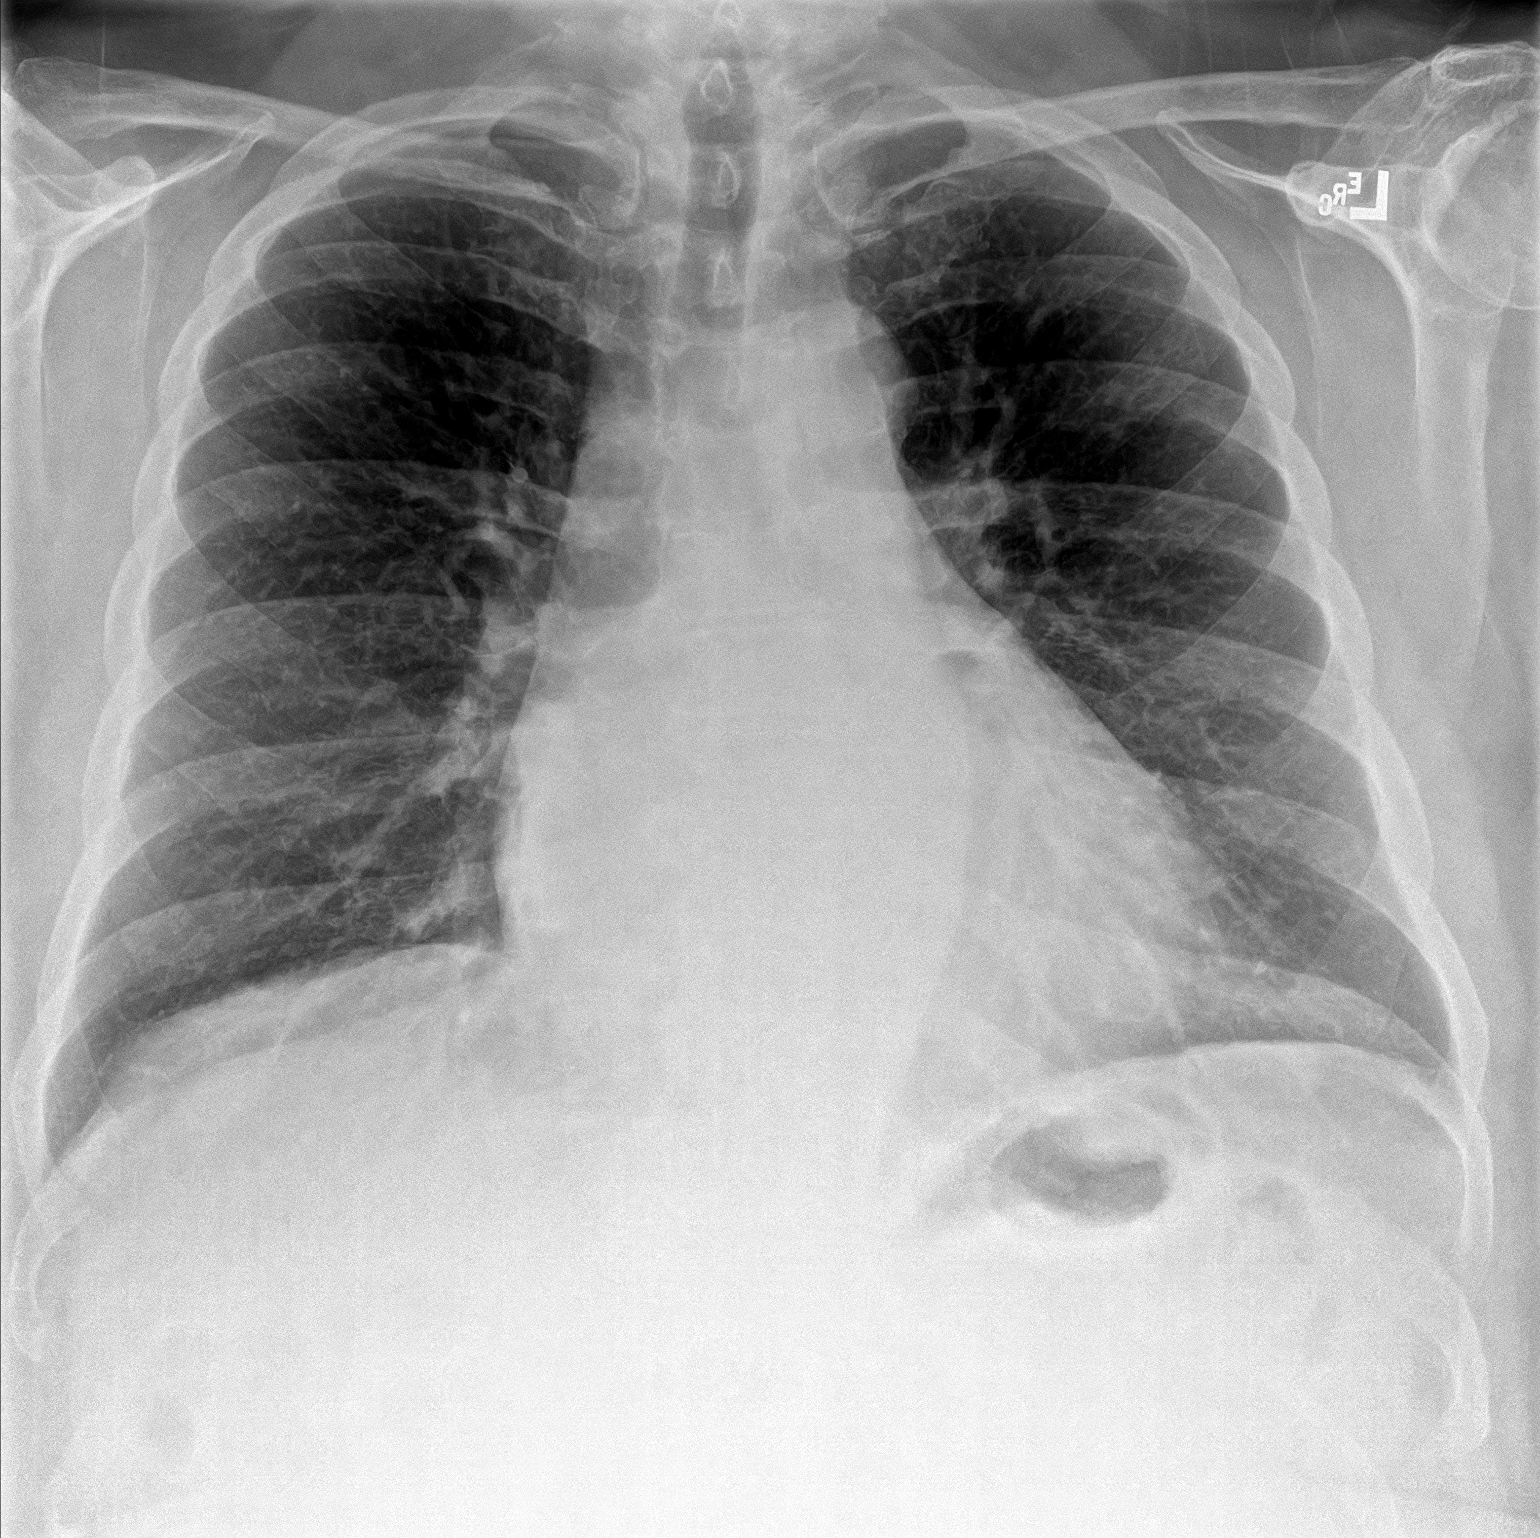
[im 2/2]
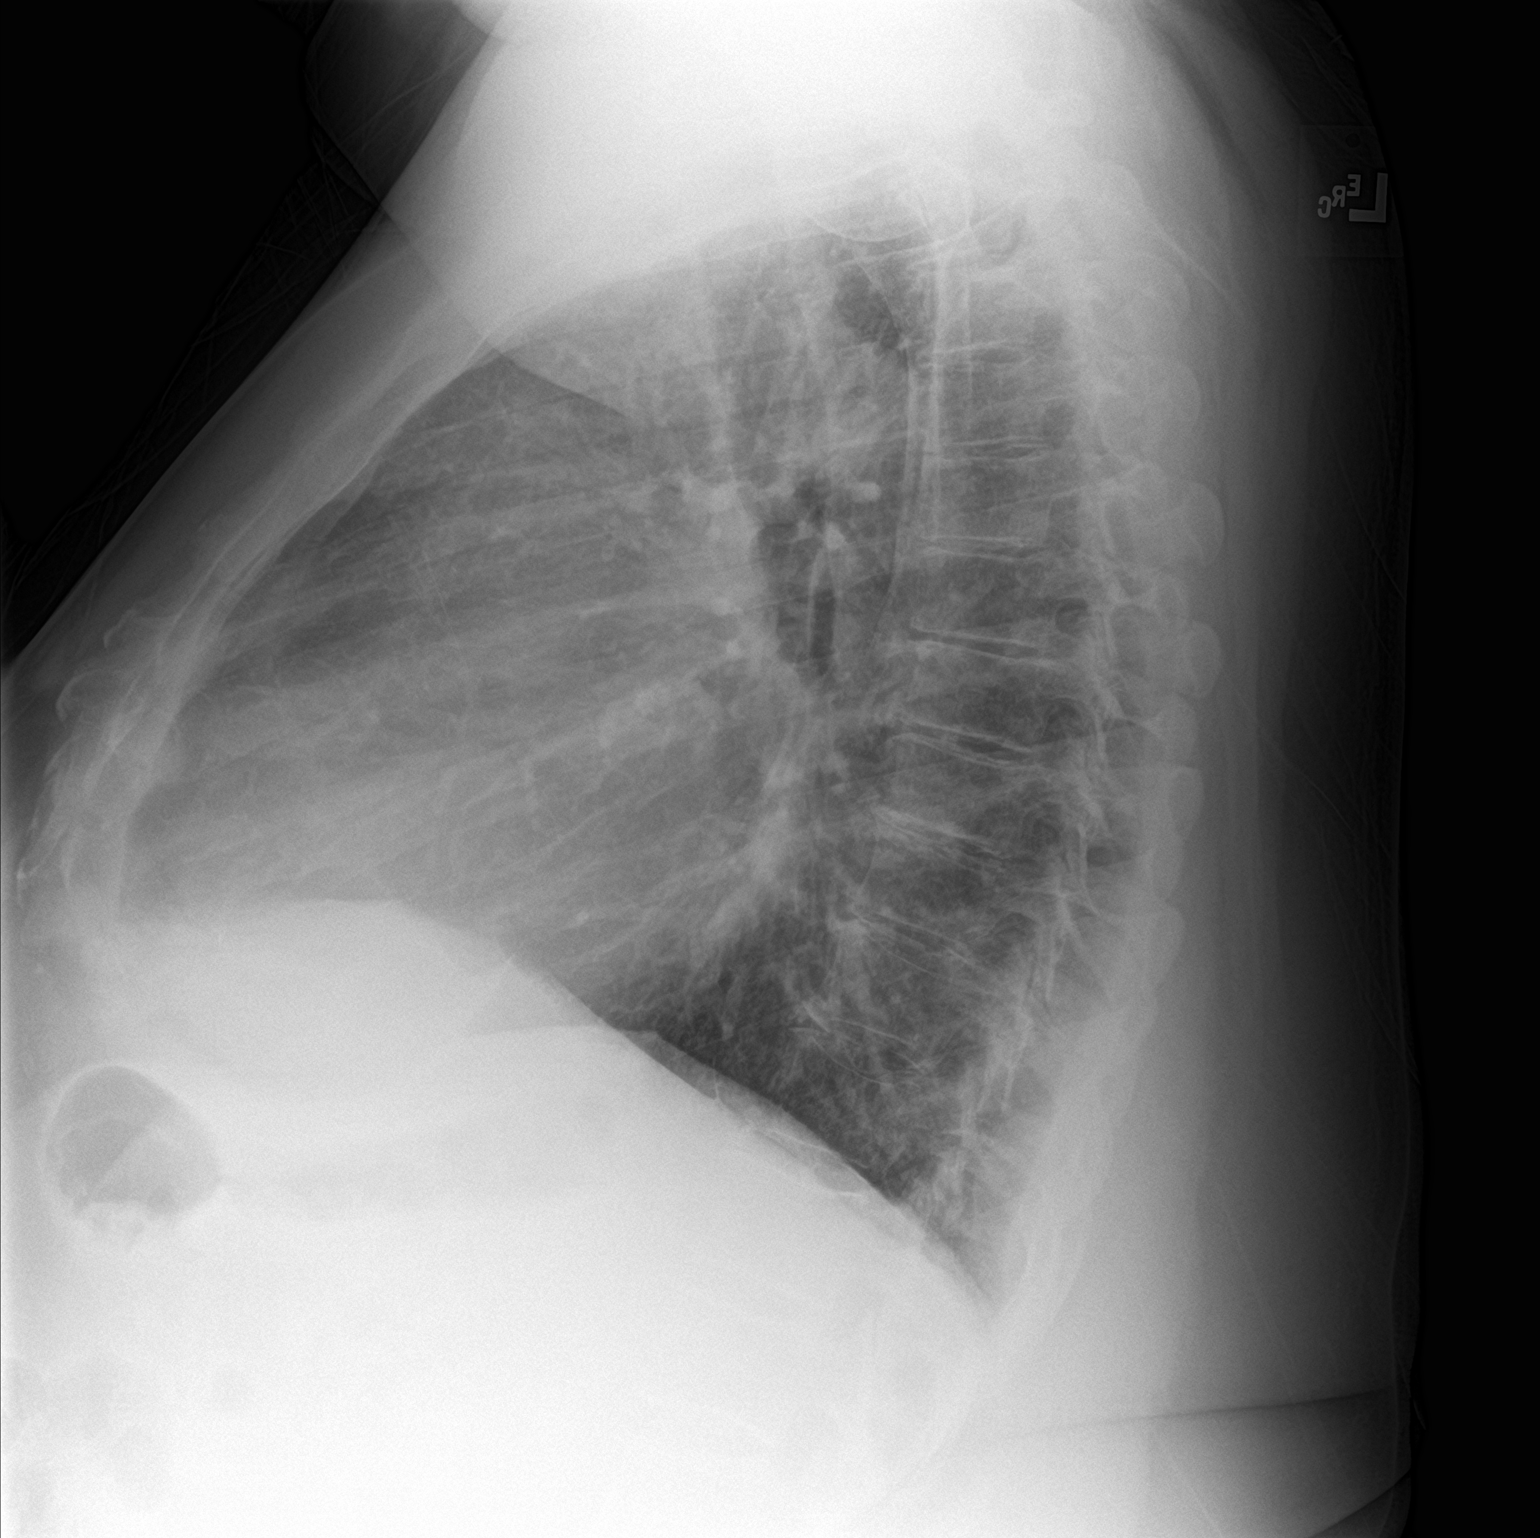

[2 of 2 positions shown; findings below may reference images not displayed]

FINDINGS: The heart size and mediastinal contours are within normal limits.
Both lungs are clear. The visualized skeletal structures are
unremarkable.
IMPRESSION: No active cardiopulmonary disease.

## 2022-12-09 ENCOUNTER — Encounter: Payer: Medicare (Managed Care) | Admitting: *Deleted

## 2022-12-09 DIAGNOSIS — Z006 Encounter for examination for normal comparison and control in clinical research program: Secondary | ICD-10-CM

## 2022-12-09 MED ORDER — STUDY - ESSENCE - OLEZARSEN 50 MG, 80 MG OR PLACEBO SQ INJECTION (PI-HILTY)
50.0000 mg | INJECTION | SUBCUTANEOUS | Status: DC
  Administered 2022-12-09: 50 mg via SUBCUTANEOUS
  Filled 2022-12-09: qty 0.5

## 2022-12-09 NOTE — Research (Addendum)
     TREATMENT DAY 309 - STUDY WEEK 45   Subject Number: [id number]             Randomization Number:  [id number]         Date: 09-December-2022      [x] Vital Signs Collected - Blood Pressure:90/65  - Heart Rate:63 - Respiratory Rate:18 - Temperature:97.8 - Oxygen Saturation: 97%    [x]  (abdominal pain only) since last visit  [x]  Assessment of ER Visits, Hospitalizations, and Inpatient Days  [x]  Adverse Events and Concomitant Medications  [x]  Diet, Lifestyle, and Alcohol Counseling   [x]  Study Drug: [place] Injection   Mr Tarwater is here for Week 45 Day 309 of [name]. He reports no abd pain, no changes in his meds, and no visits to the Ed or Urgent care since last seen. Vs taken at 0850. Scheduled next visit for Aug 1 at 0830. Injection given in left upper abd at 0912 tol well Kit number [id number].     Current Outpatient Medications:    acetaminophen (TYLENOL) 500 MG tablet, Take 1,000 mg by mouth every 6 (six) hours as needed., Disp: , Rfl:    apixaban (ELIQUIS) 5 MG TABS tablet, Take 1 tablet (5 mg total) by mouth 2 (two) times daily., Disp: 180 tablet, Rfl: 1   Cholecalciferol (VITAMIN D) 2000 units CAPS, daily. , Disp: , Rfl:    digoxin (LANOXIN) 0.25 MG tablet, TAKE 1 TABLET DAILY, Disp: 90 tablet, Rfl: 2   furosemide (LASIX) 20 MG tablet, Take 1 tablet (20 mg total) by mouth daily as needed for edema., Disp: 90 tablet, Rfl: 3   loratadine (CLARITIN) 10 MG tablet, Take 10 mg by mouth daily., Disp: , Rfl:    metoprolol succinate (TOPROL-XL) 100 MG 24 hr tablet, TAKE 2 TABLETS EVERY MORNING AND 1 TABLET EVERY EVENING, Disp: 270 tablet, Rfl: 1   Multiple Vitamins-Minerals (CENTRUM SILVER ADULT 50+) TABS, Take 1 tablet by mouth daily., Disp: , Rfl:    pravastatin (PRAVACHOL) 80 MG tablet, Take 1 tablet (80 mg total) by mouth daily., Disp: 90 tablet, Rfl: 0   sacubitril-valsartan (ENTRESTO) 24-26 MG, Take 1 tablet by mouth 2 (two) times daily., Disp: 180 tablet, Rfl: 3   traMADol (ULTRAM) 50  MG tablet, TAKE 1 TABLET(50 MG) BY MOUTH EVERY 6 HOURS AS NEEDED FOR PAIN, Disp: 90 tablet, Rfl: 0   verapamil (CALAN-SR) 120 MG CR tablet, TAKE 1 TABLET DAILY, Disp: 90 tablet, Rfl: 2  Current Facility-Administered Medications:    Study - [name] - olezarsen 50 mg, 80 mg or placebo SQ injection (PI-Hilty), 50 mg, Subcutaneous, Q28 days, Hilty, Lisette Abu, MD

## 2022-12-12 ENCOUNTER — Other Ambulatory Visit: Payer: Self-pay | Admitting: Cardiovascular Disease

## 2022-12-13 ENCOUNTER — Other Ambulatory Visit: Payer: Self-pay | Admitting: Family Medicine

## 2022-12-13 DIAGNOSIS — M170 Bilateral primary osteoarthritis of knee: Secondary | ICD-10-CM

## 2022-12-14 MED ORDER — TRAMADOL HCL 50 MG PO TABS
ORAL_TABLET | ORAL | 0 refills | Status: DC
Start: 2022-12-14 — End: 2023-01-24

## 2022-12-15 ENCOUNTER — Ambulatory Visit: Payer: Medicare (Managed Care) | Admitting: Internal Medicine

## 2022-12-20 NOTE — Progress Notes (Signed)
Cardiology Office Note Date:  12/23/2022  Patient ID:  Carlos, Crosby 09-Jun-1955, MRN [id number] PCP:  Glori Luis, MD  Cardiologist:  Julien Nordmann, MD Electrophysiologist: Sherryl Manges, MD   Chief Complaint: 1 year follow-up, past due  History of Present Illness: Carlos Crosby is a 67 y.o. male with PMH notable for perm AFib, CHF, OSA, hypertension; seen today for Sherryl Manges, MD for routine electrophysiology followup. He last saw Dr. Graciela Husbands 12/2020.  Remained in permanent A-fib with rate control strategy using combination of verapamil, metoprolol, and digoxin.  Heart rates by Kardia mobile in the 80s at home.   On follow-up today, he feels well. He has lost ~60lbs, and plans to continue to lose more. He limites caloric intake, does low-impact workouts at home and walks in the pool for exercise. Increased exercise limited by knee pain.  He continues to use KardiaMobile to monitor ventricular rates, rates are usually in 50-60s. He is asymptomatic of AFib, no palpitations, chest pain, chest pressure of fluttering in chest. He did not take dig this AM, as requested. He diligently uses CPAP nightly No bleeding concerns on eliquis BID. He is in a research study for triglycerides, and says that at his last appt, nursing noted that his BP was 90/60. He does not have a BP cuff at home. He denies dizziness, lightheadedness, presyncope or syncope.    Past Medical History:  Diagnosis Date   Chronic a-fib (HCC)    Chronic systolic CHF (congestive heart failure) (HCC)    Depression    Edema 09/26/2014   Hemochromatosis    History of kidney stones    History of pulmonary embolism    Hyperlipidemia    Hypertension    Migraine    Obesity    OSA (obstructive sleep apnea)    on CPAP   Osteoarthritis    bilateral knees   [place] spotted fever 2011    Past Surgical History:  Procedure Laterality Date   CARDIAC CATHETERIZATION  2012   [facility]   KNEE SURGERY     right knee     LITHOTRIPSY  07/31/2014   URETERAL STENT PLACEMENT  2016   URETEROSCOPY      Current Outpatient Medications  Medication Instructions   acetaminophen (TYLENOL) 1,000 mg, Oral, Every 6 hours PRN   apixaban (ELIQUIS) 5 mg, Oral, 2 times daily   Cholecalciferol (VITAMIN D) 2000 units CAPS Daily   digoxin (LANOXIN) 250 mcg, Oral, Daily   furosemide (LASIX) 20 mg, Oral, Daily PRN   loratadine (CLARITIN) 10 mg, Oral, Daily   metoprolol succinate (TOPROL-XL) 100 MG 24 hr tablet TAKE 2 TABLETS EVERY MORNING AND 1 TABLET EVERY EVENING   Multiple Vitamins-Minerals (CENTRUM SILVER ADULT 50+) TABS 1 tablet, Oral, Daily   pravastatin (PRAVACHOL) 80 mg, Oral, Daily   sacubitril-valsartan (ENTRESTO) 24-26 MG 1 tablet, Oral, 2 times daily   traMADol (ULTRAM) 50 MG tablet TAKE 1 TABLET(50 MG) BY MOUTH EVERY 6 HOURS AS NEEDED FOR PAIN    Social History:  The patient  reports that he has never smoked. He has never used smokeless tobacco. He reports that he does not drink alcohol and does not use drugs.   Family History:   The patient's family history includes Arthritis in his father; Cancer in his father and paternal grandfather; Hypertension in his father; Thyroid disease in his mother.  ROS:  Please see the history of present illness. All other systems are reviewed and otherwise negative.  PHYSICAL EXAM:  VS:  BP 98/80 (BP Location: Left Arm, Patient Position: Sitting, Cuff Size: Large)   Pulse 77   Ht 5\' 11"  (1.803 m)   Wt (!) 309 lb (140.2 kg)   SpO2 99%   BMI 43.10 kg/m  BMI: Body mass index is 43.1 kg/m.  GEN- The patient is well appearing, alert and oriented x 3 today.   Lungs- Clear to ausculation bilaterally, normal work of breathing.  Heart- Irregularly irregular rate and rhythm, no murmurs, rubs or gallops Extremities- No peripheral edema, warm, dry   EKG is ordered. Personal review of EKG from today shows:  Afib, rate 77bpm       Recent Labs: 09/12/2022: Hemoglobin 17.5;  Platelet Count 190 10/17/2022: ALT 16; BUN 19; Creatinine, Ser 1.04; Potassium 4.4; Sodium 139  07/08/2022: Cholesterol 127; HDL 37.70; LDL Cholesterol 70; Total CHOL/HDL Ratio 3; Triglycerides 98.0; VLDL 19.6   CrCl cannot be calculated (Patient's most recent lab result is older than the maximum 21 days allowed.).   Wt Readings from Last 3 Encounters:  12/23/22 (!) 309 lb (140.2 kg)  10/17/22 (!) 318 lb 12.8 oz (144.6 kg)  08/11/22 (!) 336 lb (152.4 kg)     Additional studies reviewed include: Previous EP, cardiology notes.   TTE, 06/10/2021  1. Left ventricular ejection fraction, by estimation, is 50 to 55%. The left ventricle has low normal function. The left ventricle has no regional wall motion abnormalities. The left ventricular internal cavity size was mildly dilated. Left ventricular diastolic parameters are indeterminate.   2. Right ventricular systolic function is mildly reduced. The right ventricular size is normal.   3. Left atrial size was severely dilated.   4. Right atrial size was severely dilated.   5. The mitral valve is normal in structure. Mild to moderate mitral valve regurgitation. No evidence of mitral stenosis.   6. The aortic valve is normal in structure. Aortic valve regurgitation is mild. No aortic stenosis is present.   7. The inferior vena cava is normal in size with greater than 50% respiratory variability, suggesting right atrial pressure of 3 mmHg.   8. Challenging images  Comparison(s): LVEF 50-55%.   TTE, 10/11/2017 - Left ventricle: The cavity size was mildly dilated. Systolic function was normal. The estimated ejection fraction was in the range of 50% to 55%. Regional wall motion abnormalities cannot be excluded. The study is not technically sufficient to allow evaluation of LV diastolic function.  - Left atrium: The atrium was severely dilated.  - Right ventricle: Systolic function was normal.  - Right atrium: The atrium was moderately dilated.  -  Tricuspid valve: There was moderate regurgitation.  - Pulmonary arteries: Systolic pressure was mildly elevated. PA peak pressure: 40 mm Hg (S).   Impressions:  - Rhythm is atrial fibrillation.    ASSESSMENT AND PLAN:  #) perm AFib Rate control only strategy Rates well-controlled on 0.25dig, 200am/100pm toprol, 120mg  verapamil Borderline hypotensive today in office, though asymptomatic Will stop verapamil Cont to monitor VR on KardiaMobile at home Update dig level today  #) hypercoag d/t perm afib  CHA2DS2-VASc Score = 4 [CHF History: 1, HTN History: 1, Diabetes History: 0, Stroke History: 0, Vascular Disease History: 1, Age Score: 1, Gender Score: 0].  Therefore, the patient's annual risk of stroke is 4.8 %.    NOAC - 5mg  eliquis BID, appropriately dosed No bleeding concerns  #) HFpEF, NICM NYHA II Warm and dry on exam GDMT: toprol, entresto 24-26 Dig for rate  control Diuretic: 20mg  lasix PRN Unable to up-titrate GDMT d/t borderline BP today  #) OSA Nightly CPAP compliance encouraged  #) HTN Borderline low today in office Will stop verapamil as above He does not have a BP cuff at home He has monthly research appts and upcoming appt with PCP for ongoing BP monitoring.      Current medicines are reviewed at length with the patient today.   The patient does not have concerns regarding his medicines.  The following changes were made today:  STOP verapamil  Labs/ tests ordered today include:  Orders Placed This Encounter  Procedures   Digoxin level   EKG 12-Lead     Disposition: Follow up with Dr. Graciela Husbands or EP APP  9 months  Follow-up with Dr. Mariah Milling or gen cards APP in 3 months   Signed, Sherie Don, NP  12/23/22  10:31 AM  Electrophysiology [facility]

## 2022-12-22 ENCOUNTER — Telehealth: Payer: Self-pay

## 2022-12-22 NOTE — Telephone Encounter (Signed)
-----   Message from [place] R[place] sent at 12/21/2022  1:22 PM EDT ----- Regarding: dig Please call patient and ask him to hold his digoxin Friday morning, so that we can check a dig level at our visit.   Thank you, Luella Cook

## 2022-12-22 NOTE — Telephone Encounter (Signed)
Spoke with patient and asked him to not take his digoxin prior to coming to his appointment with Sherie Don tomorrow so she can check his blood level. Patient agreed stating he thinks it has been awhile since it has been checked. Patient expressed gratitude for the call and had no further questions.

## 2022-12-23 ENCOUNTER — Ambulatory Visit: Payer: Medicare (Managed Care) | Attending: Internal Medicine | Admitting: Cardiology

## 2022-12-23 ENCOUNTER — Encounter: Payer: Self-pay | Admitting: Cardiology

## 2022-12-23 ENCOUNTER — Other Ambulatory Visit
Admission: RE | Admit: 2022-12-23 | Discharge: 2022-12-23 | Disposition: A | Discharge status: Home / Self Care | Payer: Medicare (Managed Care) | Attending: Cardiology | Admitting: Cardiology

## 2022-12-23 VITALS — BP 98/80 | HR 77 | Ht 71.0 in | Wt 309.0 lb

## 2022-12-23 DIAGNOSIS — I5032 Chronic diastolic (congestive) heart failure: Secondary | ICD-10-CM | POA: Diagnosis not present

## 2022-12-23 DIAGNOSIS — I428 Other cardiomyopathies: Secondary | ICD-10-CM | POA: Diagnosis not present

## 2022-12-23 DIAGNOSIS — Z79899 Other long term (current) drug therapy: Secondary | ICD-10-CM

## 2022-12-23 DIAGNOSIS — I10 Essential (primary) hypertension: Secondary | ICD-10-CM | POA: Diagnosis not present

## 2022-12-23 DIAGNOSIS — I4821 Permanent atrial fibrillation: Secondary | ICD-10-CM | POA: Insufficient documentation

## 2022-12-23 DIAGNOSIS — G4733 Obstructive sleep apnea (adult) (pediatric): Secondary | ICD-10-CM | POA: Diagnosis not present

## 2022-12-23 DIAGNOSIS — D6869 Other thrombophilia: Secondary | ICD-10-CM | POA: Diagnosis not present

## 2022-12-23 LAB — DIGOXIN LEVEL: Digoxin Level: 0.7 ng/mL — ABNORMAL LOW (ref 0.8–2.0)

## 2022-12-23 NOTE — Patient Instructions (Signed)
Medication Instructions:  STOP verapamil  *If you need a refill on your cardiac medications before your next appointment, please call your pharmacy*   Lab Work: Digoxin level - [facility] go to [facility]. You will check in at the front desk to the right as you walk into the atrium. Valet Parking is offered if needed. - No appointment needed. You may go any day between 7 am and 6 pm.  If you have labs (blood work) drawn today and your tests are completely normal, you will receive your results only by: MyChart Message (if you have MyChart) OR A paper copy in the mail If you have any lab test that is abnormal or we need to change your treatment, we will call you to review the results.   Testing/Procedures: No testing ordered  Follow-Up: At [facility], you and your health needs are our priority.  As part of our continuing mission to provide you with exceptional heart care, we have created designated Provider Care Teams.  These Care Teams include your primary Cardiologist (physician) and Advanced Practice Providers (APPs -  Physician Assistants and Nurse Practitioners) who all work together to provide you with the care you need, when you need it.  We recommend signing up for the patient portal called "MyChart".  Sign up information is provided on this After Visit Summary.  MyChart is used to connect with patients for Virtual Visits (Telemedicine).  Patients are able to view lab/test results, encounter notes, upcoming appointments, etc.  Non-urgent messages can be sent to your provider as well.   To learn more about what you can do with MyChart, go to [email or web address].    Your next appointment:   9 month(s)  Provider:   Sherryl Manges, MD or Sherie Don, NP

## 2023-01-04 ENCOUNTER — Encounter (INDEPENDENT_AMBULATORY_CARE_PROVIDER_SITE_OTHER): Payer: Self-pay

## 2023-01-06 ENCOUNTER — Encounter: Payer: Medicare (Managed Care) | Admitting: *Deleted

## 2023-01-06 DIAGNOSIS — Z006 Encounter for examination for normal comparison and control in clinical research program: Secondary | ICD-10-CM

## 2023-01-06 MED ORDER — STUDY - ESSENCE - OLEZARSEN 50 MG, 80 MG OR PLACEBO SQ INJECTION (PI-HILTY)
50.0000 mg | INJECTION | SUBCUTANEOUS | Status: DC
  Administered 2023-01-06: 50 mg via SUBCUTANEOUS
  Filled 2023-01-06: qty 0.5

## 2023-01-06 NOTE — Research (Signed)
     TREATMENT DAY 337  - STUDY WEEK 49    Subject Number:  [id number]           Randomization Number: [id number]            Date:06-Jan-2023     [x] Vital Signs Collected - Blood Pressure:118/78 - Heart Rate:86 - Respiratory Rate:16 - Temperature:97.6 - Oxygen Saturation:98%   [x]  (abdominal pain only) since last visit  [x]  Assessment of ER Visits, Hospitalizations, and Inpatient Days  [x]  Adverse Events and Concomitant Medications  [x]  Diet, Lifestyle, and Alcohol Counseling   [x]  Study Drug: Baxter Injection   Carlos Crosby is here for Week 49 Day 337 of [name] research. He reports no abd pain, or other s/s of pancreatitis, and no visits to the Ed or urgent care since seen last. He does report Verapamil was D/C. Vs taken at 0832. Scheduled next visit for Sept 5 at 0830. Injection given at 0849 in left lower abd. Kit number [id number].    Current Outpatient Medications:    acetaminophen (TYLENOL) 500 MG tablet, Take 1,000 mg by mouth every 6 (six) hours as needed., Disp: , Rfl:    apixaban (ELIQUIS) 5 MG TABS tablet, Take 1 tablet (5 mg total) by mouth 2 (two) times daily., Disp: 180 tablet, Rfl: 1   Cholecalciferol (VITAMIN D) 2000 units CAPS, daily. , Disp: , Rfl:    digoxin (LANOXIN) 0.25 MG tablet, TAKE 1 TABLET DAILY, Disp: 90 tablet, Rfl: 2   furosemide (LASIX) 20 MG tablet, Take 1 tablet (20 mg total) by mouth daily as needed for edema., Disp: 90 tablet, Rfl: 3   loratadine (CLARITIN) 10 MG tablet, Take 10 mg by mouth daily., Disp: , Rfl:    metoprolol succinate (TOPROL-XL) 100 MG 24 hr tablet, TAKE 2 TABLETS EVERY MORNING AND 1 TABLET EVERY EVENING, Disp: 270 tablet, Rfl: 0   Multiple Vitamins-Minerals (CENTRUM SILVER ADULT 50+) TABS, Take 1 tablet by mouth daily., Disp: , Rfl:    pravastatin (PRAVACHOL) 80 MG tablet, Take 1 tablet (80 mg total) by mouth daily., Disp: 90 tablet, Rfl: 0   sacubitril-valsartan (ENTRESTO) 24-26 MG, Take 1 tablet by mouth 2 (two) times daily., Disp: 180  tablet, Rfl: 3   traMADol (ULTRAM) 50 MG tablet, TAKE 1 TABLET(50 MG) BY MOUTH EVERY 6 HOURS AS NEEDED FOR PAIN, Disp: 90 tablet, Rfl: 0  Current Facility-Administered Medications:    Study - [name] - olezarsen 50 mg, 80 mg or placebo SQ injection (PI-Hilty), 50 mg, Subcutaneous, Q28 days,

## 2023-01-09 ENCOUNTER — Other Ambulatory Visit: Payer: Self-pay | Admitting: Cardiovascular Disease

## 2023-01-11 ENCOUNTER — Other Ambulatory Visit: Payer: Self-pay

## 2023-01-11 ENCOUNTER — Other Ambulatory Visit: Payer: Self-pay | Admitting: Nurse Practitioner

## 2023-01-11 ENCOUNTER — Inpatient Hospital Stay: Payer: Medicare (Managed Care) | Attending: Nurse Practitioner

## 2023-01-11 DIAGNOSIS — D751 Secondary polycythemia: Secondary | ICD-10-CM | POA: Insufficient documentation

## 2023-01-11 LAB — CBC WITH DIFFERENTIAL/PLATELET
Abs Immature Granulocytes: 0.03 10*3/uL (ref 0.00–0.07)
Basophils Absolute: 0 10*3/uL (ref 0.0–0.1)
Basophils Relative: 1 %
Eosinophils Absolute: 0.1 10*3/uL (ref 0.0–0.5)
Eosinophils Relative: 2 %
HCT: 51.9 % (ref 39.0–52.0)
Hemoglobin: 18.1 g/dL — ABNORMAL HIGH (ref 13.0–17.0)
Immature Granulocytes: 1 %
Lymphocytes Relative: 33 %
Lymphs Abs: 1.9 10*3/uL (ref 0.7–4.0)
MCH: 33.2 pg (ref 26.0–34.0)
MCHC: 34.9 g/dL (ref 30.0–36.0)
MCV: 95.1 fL (ref 80.0–100.0)
Monocytes Absolute: 0.4 10*3/uL (ref 0.1–1.0)
Monocytes Relative: 8 %
Neutro Abs: 3.3 10*3/uL (ref 1.7–7.7)
Neutrophils Relative %: 55 %
Platelets: 170 10*3/uL (ref 150–400)
RBC: 5.46 MIL/uL (ref 4.22–5.81)
RDW: 13.2 % (ref 11.5–15.5)
WBC: 5.8 10*3/uL (ref 4.0–10.5)
nRBC: 0 % (ref 0.0–0.2)

## 2023-01-11 LAB — FERRITIN: Ferritin: 112 ng/mL (ref 24–336)

## 2023-01-12 ENCOUNTER — Encounter: Payer: Self-pay | Admitting: Nurse Practitioner

## 2023-01-12 ENCOUNTER — Inpatient Hospital Stay (HOSPITAL_BASED_OUTPATIENT_CLINIC_OR_DEPARTMENT_OTHER): Payer: Medicare (Managed Care) | Admitting: Nurse Practitioner

## 2023-01-12 DIAGNOSIS — D751 Secondary polycythemia: Secondary | ICD-10-CM

## 2023-01-12 NOTE — Progress Notes (Signed)
Virtual Visit Progress Note  I connected with [name] on 01/12/23 at  3:45 PM EDT by telephone visit and verified that I am speaking with the correct person using two identifiers.   I discussed the limitations, risks, security and privacy concerns of performing an evaluation and management service by telemedicine and the availability of in-person appointments. I also discussed with the patient that there may be a patient responsible charge related to this service. The patient expressed understanding and agreed to proceed.   Other persons participating in the visit and their role in the encounter: None   Patient's location: home Provider's location: Clinic   Chief Complaint: Hemochromatosis     Patient Care Team: Glori Luis, MD as PCP - General (Family Medicine) Antonieta Iba, MD as PCP - Cardiology (Cardiology) Duke Salvia, MD as PCP - Electrophysiology (Cardiology) Antonieta Iba, MD as Consulting Physician (Cardiology) Jeralyn Ruths, MD as Consulting Physician (Oncology)   Name of the patient: Carlos Crosby  [id number]  16-May-1956   Date of visit: 01/12/23  History of Presenting Illness- Patient is 67 year old male who agrees to evaluation via telemedicine for follow up for hemochromatosis. He continues research study for his triglycerides. Otherwise feels at baseline. Has not had phlebotomy in several months. Reports compliance with cpap. Not smoker. Otherwise feels well and denies complaints.   Review of systems- Review of Systems  Constitutional:  Negative for chills, diaphoresis, fever, malaise/fatigue and weight loss.  Respiratory:  Negative for shortness of breath.   Cardiovascular:  Negative for chest pain.  Gastrointestinal:  Negative for abdominal pain, constipation, diarrhea, nausea and vomiting.  Skin:  Negative for rash.  Neurological:  Negative for dizziness, loss of consciousness, weakness and headaches.  Psychiatric/Behavioral:   Negative for depression. The patient is not nervous/anxious.   All other systems reviewed and are negative.    Allergies  Allergen Reactions   Allopurinol Hives and Swelling   Diltiazem Itching and Rash   Chlorhexidine      lump in throat    Peridex [Chlorhexidine Gluconate]     Mouth swelling  Blisters    Ondansetron Rash   Penicillins Other (See Comments) and Nausea Only    Pt unsure of rxn; showed up on allergy test Pt unsure of rxn; showed up on allergy test Pt unsure of rxn; showed up on allergy test And dirrhea    Past Medical History:  Diagnosis Date   Chronic a-fib (HCC)    Chronic systolic CHF (congestive heart failure) (HCC)    Depression    Edema 09/26/2014   Hemochromatosis    History of kidney stones    History of pulmonary embolism    Hyperlipidemia    Hypertension    Migraine    Obesity    OSA (obstructive sleep apnea)    on CPAP   Osteoarthritis    bilateral knees   Rocky Mountain spotted fever 2011   Past Surgical History:  Procedure Laterality Date   CARDIAC CAT[facility]IZATION  2012   [facility]   KNEE SURGERY     right knee    LITHOTRIPSY  07/31/2014   URETERAL STENT PLACEMENT  2016   URETEROSCOPY     Social History   Socioeconomic History   Marital status: Married    Spouse name: Not on file   Number of children: Not on file   Years of education: Not on file   Highest education level: Bachelor's degree (e.g., BA, AB, BS)  Occupational History   Not on file  Tobacco Use   Smoking status: Never   Smokeless tobacco: Never  Vaping Use   Vaping status: Never Used  Substance and Sexual Activity   Alcohol use: No    Comment: occasional; 3 times a year   Drug use: No   Sexual activity: Not Currently  Other Topics Concern   Not on file  Social History Narrative   Not on file   Social Determinants of Health   Financial Resource Strain: Low Risk  (10/13/2022)   Overall Financial Resource Strain (CARDIA)    Difficulty of Paying Living  Expenses: Not hard at all  Recent Concern: Financial Resource Strain - Medium Risk (08/07/2022)   Overall Financial Resource Strain (CARDIA)    Difficulty of Paying Living Expenses: Somewhat hard  Food Insecurity: No Food Insecurity (10/13/2022)   Hunger Vital Sign    Worried About Running Out of Food in the Last Year: Never true    Ran Out of Food in the Last Year: Never true  Transportation Needs: No Transportation Needs (10/13/2022)   PRAPARE - Administrator, Civil Service (Medical): No    Lack of Transportation (Non-Medical): No  Physical Activity: Insufficiently Active (10/13/2022)   Exercise Vital Sign    Days of Exercise per Week: 2 days    Minutes of Exercise per Session: 50 min  Stress: No Stress Concern Present (10/13/2022)   [employer] of Occupational Health - Occupational Stress Questionnaire    Feeling of Stress : Not at all  Social Connections: Moderately Integrated (10/13/2022)   Social Connection and Isolation Panel [NHANES]    Frequency of Communication with Friends and Family: More than three times a week    Frequency of Social Gatherings with Friends and Family: Once a week    Attends Religious Services: Never    Database administrator or Organizations: Yes    Attends Engineer, structural: More than 4 times per year    Marital Status: Married  Catering manager Violence: Not At Risk (08/11/2022)   Humiliation, Afraid, Rape, and Kick questionnaire    Fear of Current or Ex-Partner: No    Emotionally Abused: No    Physically Abused: No    Sexually Abused: No   Immunization History  Administered Date(s) Administered   COVID-19, mRNA, vaccine(Comirnaty)12 years and older 03/17/2022   Fluad Quad(high Dose 65+) 03/17/2022   Influenza,inj,Quad PF,6+ Mos 03/20/2013, 02/27/2015, 01/26/2017, 02/19/2018, 02/05/2019, 02/25/2020, 02/19/2021   Influenza-Unspecified 03/08/2014, 02/26/2016, 02/17/2018, 02/19/2018   Moderna SARS-COV2 Booster Vaccination  09/01/2020, 02/19/2021, 03/17/2022   Moderna Sars-Covid-2 Vaccination 08/03/2019, 08/31/2019, 03/30/2020   PNEUMOCOCCAL CONJUGATE-20 10/19/2021   Pneumococcal Polysaccharide-23 05/06/2015   Respiratory Syncytial Virus Vaccine,Recomb Aduvanted(Arexvy) 02/17/2022   Tdap 02/17/2022   Vaccinia,smallpox Monkeypox Vaccine Live,pf 02/17/2022   Zoster Recombinant(Shingrix) 02/17/2022   Family History  Problem Relation Age of Onset   Thyroid disease Mother    Cancer Father        lung   Arthritis Father    Hypertension Father    Cancer Paternal Grandfather        Throat cancer    Current Outpatient Medications:    acetaminophen (TYLENOL) 500 MG tablet, Take 1,000 mg by mouth every 6 (six) hours as needed., Disp: , Rfl:    apixaban (ELIQUIS) 5 MG TABS tablet, Take 1 tablet (5 mg total) by mouth 2 (two) times daily., Disp: 180 tablet, Rfl: 1   Cholecalciferol (VITAMIN D) 2000 units CAPS, daily. ,  Disp: , Rfl:    digoxin (LANOXIN) 0.25 MG tablet, TAKE 1 TABLET DAILY, Disp: 90 tablet, Rfl: 2   furosemide (LASIX) 20 MG tablet, Take 1 tablet (20 mg total) by mouth daily as needed for edema., Disp: 90 tablet, Rfl: 3   loratadine (CLARITIN) 10 MG tablet, Take 10 mg by mouth daily., Disp: , Rfl:    metoprolol succinate (TOPROL-XL) 100 MG 24 hr tablet, TAKE 2 TABLETS EVERY MORNING AND 1 TABLET EVERY EVENING, Disp: 270 tablet, Rfl: 0   Multiple Vitamins-Minerals (CENTRUM SILVER ADULT 50+) TABS, Take 1 tablet by mouth daily., Disp: , Rfl:    pravastatin (PRAVACHOL) 80 MG tablet, TAKE 1 TABLET DAILY, Disp: 90 tablet, Rfl: 0   sacubitril-valsartan (ENTRESTO) 24-26 MG, Take 1 tablet by mouth 2 (two) times daily., Disp: 180 tablet, Rfl: 3   traMADol (ULTRAM) 50 MG tablet, TAKE 1 TABLET(50 MG) BY MOUTH EVERY 6 HOURS AS NEEDED FOR PAIN, Disp: 90 tablet, Rfl: 0  Current Facility-Administered Medications:    Study - [name] - olezarsen 50 mg, 80 mg or placebo SQ injection (PI-Hilty), 50 mg, Subcutaneous, Q28  days, , 50 mg at 01/06/23 9147  Physical exam: Exam limited due to telemedicine Physical Exam Vitals reviewed.  Pulmonary:     Effort: No respiratory distress.  Neurological:     Mental Status: He is alert and oriented to person, place, and time.  Psychiatric:        Mood and Affect: Mood normal.        Behavior: Behavior normal.        Latest Ref Rng & Units 01/11/2023    8:50 AM 09/12/2022    1:06 PM 05/11/2022    1:01 PM  CBC  WBC 4.0 - 10.5 K/uL 5.8  7.4  8.0   Hemoglobin 13.0 - 17.0 g/dL 82.9  56.2  13.0   Hematocrit 39.0 - 52.0 % 51.9  50.9  50.2   Platelets 150 - 400 K/uL 170  190  198    Iron/TIBC/Ferritin/ %Sat    Component Value Date/Time   IRON 158 08/10/2021 1353   TIBC 365 08/10/2021 1353   FERRITIN 112 01/11/2023 0850   IRONPCTSAT 43 (H) 08/10/2021 1353   IRONPCTSAT 32 06/22/2017 0804    Assessment and plan- Patient is a 67 y.o. male   No diagnosis found.  Hemochromatosis- I discussed with the patient the entire mechanism of hemochromatosis. We discussed the clinical symptoms of hemochromatosis including elevation of liver function tests as well as skin hyperpigmentation, diabetes, arthralgias and sometimes EKG abnormalities. The severity of the symptoms depending on whether the patient is homozygous or heterozygous. 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 Goal ferritin < 100. We reviewed labs from 01/11/23. Ferritin now increased to 112. Plan for 400 cc phlebotomy tomorrow. He has varied in frequency of phlebotomy. Will plan to extend next lab check to 4 months, +/- phlebotomy. Has not had HCC screening. No cirrhosis however. Plan to check AFP at next visit and can discuss abdominal ultrasound at next visit.  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 No additional workup at this time. Phlebotomy as planned.  Weight loss- 60 lb weight loss since January  intentionally. Congratulated on efforts.   Disposition: Phlebotomy tomorrow 4 mo- labs (cbc, ferritin, cmp, afp), D2 +/- phlebotomy 8 mo- labs (cbc, ferritin), D2 virtual visit, D3 +/- phlebotomy- la  Visit Diagnosis 1. Hereditary hemochromatosis (HCC)   2. Polycythemia  Patient expressed understanding and was in agreement with this plan. He also understands that He can call clinic at any time with any questions, concerns, or complaints.   I discussed the assessment and treatment plan with the patient. The patient was provided an opportunity to ask questions and all were answered. The patient agreed with the plan and demonstrated an understanding of the instructions.   The patient was advised to call back or seek an in-person evaluation if the symptoms worsen or if the condition fails to improve as anticipated.   I spent 15 minutes non face-to-face telephone visit time dedicated to the care of this patient on the date of this encounter to include pre-visit review of labs and trends, face-to-face time with the patient, and post visit ordering of testing/documentation.   Thank you for allowing me to participate in the care of this very pleasant patient.   Consuello Masse, DNP, AGNP-C [facility] [phone] (clinic)

## 2023-01-13 ENCOUNTER — Inpatient Hospital Stay: Payer: Medicare (Managed Care)

## 2023-01-13 NOTE — Patient Instructions (Signed)

## 2023-01-20 ENCOUNTER — Encounter: Payer: Self-pay | Admitting: Family Medicine

## 2023-01-20 ENCOUNTER — Ambulatory Visit (INDEPENDENT_AMBULATORY_CARE_PROVIDER_SITE_OTHER): Payer: Medicare (Managed Care) | Admitting: Family Medicine

## 2023-01-20 VITALS — BP 124/74 | HR 90 | Temp 97.7°F | Ht 71.0 in | Wt 305.0 lb

## 2023-01-20 DIAGNOSIS — M170 Bilateral primary osteoarthritis of knee: Secondary | ICD-10-CM | POA: Diagnosis not present

## 2023-01-20 DIAGNOSIS — I4821 Permanent atrial fibrillation: Secondary | ICD-10-CM | POA: Diagnosis not present

## 2023-01-20 NOTE — Assessment & Plan Note (Addendum)
Chronic issue.  Rate controlled.  He will continue metoprolol 200 mg in the morning and 100 mg at night, Eliquis 5 mg twice daily, cardiology manages his digoxin.

## 2023-01-20 NOTE — Progress Notes (Signed)
Marikay Alar, MD Phone: [phone]  Carlos Crosby is a 67 y.o. male who presents today for f/u.  Obesity: Patient continues to eat a 1200-calorie diet and limits carbohydrates.  He has been exercising more.  He is doing exercises through his insurance company 3 times a week.  He is able to walk better so is not using his cane as much.  Chronic knee pain: This is stable.  He is using tramadol.  He notes no drowsiness with this.  Atrial fibrillation: Patient reports cardiology took him off of the verapamil given borderline low blood pressures previously.  He notes his blood pressure has remained adequately controlled with discontinuation of the verapamil.  Social History   Tobacco Use  Smoking Status Never  Smokeless Tobacco Never    Current Outpatient Medications on File Prior to Visit  Medication Sig Dispense Refill   acetaminophen (TYLENOL) 500 MG tablet Take 1,000 mg by mouth every 6 (six) hours as needed.     apixaban (ELIQUIS) 5 MG TABS tablet Take 1 tablet (5 mg total) by mouth 2 (two) times daily. 180 tablet 1   Cholecalciferol (VITAMIN D) 2000 units CAPS daily.      digoxin (LANOXIN) 0.25 MG tablet TAKE 1 TABLET DAILY 90 tablet 2   furosemide (LASIX) 20 MG tablet Take 1 tablet (20 mg total) by mouth daily as needed for edema. 90 tablet 3   loratadine (CLARITIN) 10 MG tablet Take 10 mg by mouth daily.     metoprolol succinate (TOPROL-XL) 100 MG 24 hr tablet TAKE 2 TABLETS EVERY MORNING AND 1 TABLET EVERY EVENING 270 tablet 0   Multiple Vitamins-Minerals (CENTRUM SILVER ADULT 50+) TABS Take 1 tablet by mouth daily.     pravastatin (PRAVACHOL) 80 MG tablet TAKE 1 TABLET DAILY 90 tablet 0   sacubitril-valsartan (ENTRESTO) 24-26 MG Take 1 tablet by mouth 2 (two) times daily. 180 tablet 3   traMADol (ULTRAM) 50 MG tablet TAKE 1 TABLET(50 MG) BY MOUTH EVERY 6 HOURS AS NEEDED FOR PAIN 90 tablet 0   Current Facility-Administered Medications on File Prior to Visit  Medication  Dose Route Frequency Provider Last Rate Last Admin   Study - [name] - olezarsen 50 mg, 80 mg or placebo SQ injection (PI-Hilty)  50 mg Subcutaneous Q28 days    50 mg at 01/06/23 0849     ROS see history of present illness  Objective  Physical Exam Vitals:   01/20/23 1116  BP: 124/74  Pulse: 90  Temp: 97.7 F (36.5 C)  SpO2: 99%    BP Readings from Last 3 Encounters:  01/20/23 124/74  01/13/23 106/76  01/06/23 118/78   Wt Readings from Last 3 Encounters:  01/20/23 (!) 305 lb (138.3 kg)  12/23/22 (!) 309 lb (140.2 kg)  10/17/22 (!) 318 lb 12.8 oz (144.6 kg)    Physical Exam Constitutional:      General: He is not in acute distress.    Appearance: He is not diaphoretic.  Cardiovascular:     Rate and Rhythm: Normal rate. Rhythm irregularly irregular.     Heart sounds: Normal heart sounds.  Pulmonary:     Effort: Pulmonary effort is normal.     Breath sounds: Normal breath sounds.  Skin:    General: Skin is warm and dry.  Neurological:     Mental Status: He is alert.      Assessment/Plan: Please see individual problem list.  Permanent atrial fibrillation [facility]) Assessment & Plan: Chronic issue.  Rate controlled.  He will continue metoprolol 200 mg in the morning and 100 mg at night, Eliquis 5 mg twice daily, cardiology manages his digoxin.   Primary osteoarthritis of both knees Assessment & Plan: Chronic issue.  Stable.  He can continue tramadol 50 mg every 6 hours as needed for pain.   Morbid (severe) obesity due to excess calories [facility]) Assessment & Plan: Chronic issue.  Weight has progressively been improving.  He will continue diet and exercise changes.     Return in about 3 months (around 04/22/2023).   Marikay Alar, MD [facility] [place]

## 2023-01-20 NOTE — Assessment & Plan Note (Signed)
Chronic issue.  Stable.  He can continue tramadol 50 mg every 6 hours as needed for pain.

## 2023-01-20 NOTE — Assessment & Plan Note (Signed)
Chronic issue.  Weight has progressively been improving.  He will continue diet and exercise changes.

## 2023-01-23 ENCOUNTER — Other Ambulatory Visit: Payer: Self-pay | Admitting: Family Medicine

## 2023-01-23 DIAGNOSIS — M170 Bilateral primary osteoarthritis of knee: Secondary | ICD-10-CM

## 2023-01-24 ENCOUNTER — Encounter: Payer: Self-pay | Admitting: Family Medicine

## 2023-01-24 DIAGNOSIS — M170 Bilateral primary osteoarthritis of knee: Secondary | ICD-10-CM

## 2023-01-24 MED ORDER — TRAMADOL HCL 50 MG PO TABS
ORAL_TABLET | ORAL | 0 refills | Status: DC
Start: 2023-01-24 — End: 2023-02-15

## 2023-01-30 ENCOUNTER — Telehealth: Payer: Self-pay | Admitting: Physician Assistant

## 2023-01-30 NOTE — Telephone Encounter (Signed)
Pt called in stating he would like another Gel injection pt had Monovisc 09/08/22, pt is aware he cannot get injection before 03/10/23, please advise

## 2023-01-30 NOTE — Telephone Encounter (Signed)
Noted.  Will submit in September, 2024 for Monovisc, bilateral  Next available gel injection appointment needs to be after 03/10/2023.

## 2023-01-31 DIAGNOSIS — G4733 Obstructive sleep apnea (adult) (pediatric): Secondary | ICD-10-CM | POA: Diagnosis not present

## 2023-02-09 ENCOUNTER — Other Ambulatory Visit: Payer: Self-pay

## 2023-02-09 ENCOUNTER — Encounter: Payer: Medicare (Managed Care) | Admitting: *Deleted

## 2023-02-09 DIAGNOSIS — Z006 Encounter for examination for normal comparison and control in clinical research program: Secondary | ICD-10-CM

## 2023-02-09 MED ORDER — STUDY - ESSENCE - OLEZARSEN 50 MG, 80 MG OR PLACEBO SQ INJECTION (PI-HILTY)
50.0000 mg | INJECTION | SUBCUTANEOUS | Status: DC
Start: 2023-03-03 — End: 2023-05-11

## 2023-02-09 NOTE — Progress Notes (Signed)
[name] is in for follow up.  Doing well.  Not having any particular symptoms.  He has chronic atrial fibrillation on long term anticoagulation.  He is in [name] week 53 which means he will no longer be getting investigational agent in the trial.  The IP lowers triglycerides by about 50% with few side effects.  He does not describe any issues with IP from the past.  He is  a long term patient of Dr. Mariah Milling with lower EF who also sees Dr. Graciela Husbands.  He also has hemochromatosis.   Alert, oriented male in NAD BP             P                R           T Moderate obesity, improved No JVD Lungs clear Cor irregularly irregular without murmur Abd soft with no obvious masses No definite edema    ECG - Atrial fibrillation with no definite change from last tracing 12/23/2022   Impression Chronic AF Lower EF Hypertriglyceridemia on [name] trial  Plan    One more visit to research team  Follow up with Dr. Mariah Milling for longer term treatment plan

## 2023-02-09 NOTE — Research (Signed)
     TREATMENT DAY 365 - STUDY WEEK 53    Subject Number:  Z610             Randomization Number: [id number]           Date:5-Sept-2024     [x] Vital Signs Collected - Blood Pressure:110/86 - Weight:301.8 - Heart Rate:75 - Respiratory Rate:16 - Temperature:36.4 - Oxygen Saturation: 98%  [x]  Physical Exam Completed by PI or SUB-I  [x]  12-lead ECG  []  Pregnancy Test (If applicable)  [x]  Extended Urinalysis   [x]  Lab collection per protocol  [x]   (abdominal pain only) since last visit  [x]  Assessment of ER Visits, Hospitalizations, and Inpatient Days  [x]  Adverse Events and Concomitant Medications  [x]  Diet, Lifestyle, and Alcohol Counseling   []  Study Drug: [place] Injection   Carlos Crosby is here for Week 53 of [name]. He reports no and pain, or any other S&S of pancreatitis, no changes in his meds, and no visits to the ED or Urgent care. Vs taken at 0820. Blood drawn at 0834. Urine obtained at 0842. Informed him I will call to check on him next month. Voice understanding. Dr Riley Kill did exam and read EKG.    Current Outpatient Medications:    acetaminophen (TYLENOL) 500 MG tablet, Take 1,000 mg by mouth every 6 (six) hours as needed., Disp: , Rfl:    apixaban (ELIQUIS) 5 MG TABS tablet, Take 1 tablet (5 mg total) by mouth 2 (two) times daily., Disp: 180 tablet, Rfl: 1   Cholecalciferol (VITAMIN D) 2000 units CAPS, daily. , Disp: , Rfl:    digoxin (LANOXIN) 0.25 MG tablet, TAKE 1 TABLET DAILY, Disp: 90 tablet, Rfl: 2   furosemide (LASIX) 20 MG tablet, Take 1 tablet (20 mg total) by mouth daily as needed for edema., Disp: 90 tablet, Rfl: 3   loratadine (CLARITIN) 10 MG tablet, Take 10 mg by mouth daily., Disp: , Rfl:    metoprolol succinate (TOPROL-XL) 100 MG 24 hr tablet, TAKE 2 TABLETS EVERY MORNING AND 1 TABLET EVERY EVENING, Disp: 270 tablet, Rfl: 0   Multiple Vitamins-Minerals (CENTRUM SILVER ADULT 50+) TABS, Take 1 tablet by mouth daily., Disp: , Rfl:    pravastatin  (PRAVACHOL) 80 MG tablet, TAKE 1 TABLET DAILY, Disp: 90 tablet, Rfl: 0   sacubitril-valsartan (ENTRESTO) 24-26 MG, Take 1 tablet by mouth 2 (two) times daily., Disp: 180 tablet, Rfl: 3   traMADol (ULTRAM) 50 MG tablet, TAKE 1 TABLET(50 MG) BY MOUTH EVERY 6 HOURS AS NEEDED FOR PAIN, Disp: 90 tablet, Rfl: 0  Current Facility-Administered Medications:    Study - [name] - olezarsen 50 mg, 80 mg or placebo SQ injection (PI-Hilty), 50 mg, Subcutaneous, Q28 days, , 50 mg at 01/06/23 9604

## 2023-02-15 ENCOUNTER — Other Ambulatory Visit: Payer: Self-pay | Admitting: Family Medicine

## 2023-02-15 DIAGNOSIS — M170 Bilateral primary osteoarthritis of knee: Secondary | ICD-10-CM

## 2023-02-17 MED ORDER — TRAMADOL HCL 50 MG PO TABS
ORAL_TABLET | ORAL | 0 refills | Status: DC
Start: 2023-02-17 — End: 2023-03-31

## 2023-03-07 ENCOUNTER — Encounter: Payer: Self-pay | Admitting: *Deleted

## 2023-03-07 NOTE — Research (Unsigned)
Spoke with Mr Carlos Crosby as part of [name] follow up. Mr Carlos Crosby states he feels good, no changes in his meds, no abd pain, and no visits to the Ed or urgent care since last seen. Informed him that I would call him later this month or first of next month. Voices understanding.

## 2023-03-09 ENCOUNTER — Ambulatory Visit: Payer: Medicare (Managed Care) | Admitting: Internal Medicine

## 2023-03-09 ENCOUNTER — Telehealth: Payer: Self-pay

## 2023-03-09 NOTE — Telephone Encounter (Signed)
VOB submitted for Monovisc, bilateral knee  

## 2023-03-13 ENCOUNTER — Other Ambulatory Visit: Payer: Self-pay | Admitting: Cardiovascular Disease

## 2023-03-16 ENCOUNTER — Other Ambulatory Visit: Payer: Self-pay | Admitting: Family

## 2023-03-16 ENCOUNTER — Encounter: Payer: Self-pay | Admitting: Cardiovascular Disease

## 2023-03-16 ENCOUNTER — Encounter: Payer: Self-pay | Admitting: Family Medicine

## 2023-03-16 NOTE — Telephone Encounter (Signed)
Spoke with pt and he stated that he has had gout flares for a very long time and he knows that this is what he has. He stated that he has had it for 5 days and it is not getting any better. I explained to pt that Claris Che, NP did not feel comfortablet prescribing anything due to so many contraindications with other medications he is already taking. Pt stated that he understood but is in a lot of pain. I explained to pt that it is after 5 pm and there isn't an provider in the office for me to talk with so if he was in that much pain he would need to go to UC or the ED. Pt stated that he did not want to go to UC care so pt has been scheduled for an appt in our office tomorrow at 8:20 with Bethanie Dicker, NP.

## 2023-03-17 ENCOUNTER — Encounter: Payer: Self-pay | Admitting: Nurse Practitioner

## 2023-03-17 ENCOUNTER — Ambulatory Visit: Payer: Medicare (Managed Care) | Admitting: Nurse Practitioner

## 2023-03-17 VITALS — BP 100/76 | HR 73 | Temp 97.8°F | Ht 71.0 in | Wt 296.8 lb

## 2023-03-17 DIAGNOSIS — M109 Gout, unspecified: Secondary | ICD-10-CM

## 2023-03-17 LAB — BASIC METABOLIC PANEL
BUN: 16 mg/dL (ref 6–23)
CO2: 29 meq/L (ref 19–32)
Calcium: 9.5 mg/dL (ref 8.4–10.5)
Chloride: 98 meq/L (ref 96–112)
Creatinine, Ser: 0.93 mg/dL (ref 0.40–1.50)
GFR: 85.78 mL/min (ref >60.00)
Glucose, Bld: 111 mg/dL — ABNORMAL HIGH (ref 70–99)
Potassium: 4.5 meq/L (ref 3.5–5.1)
Sodium: 136 meq/L (ref 135–145)

## 2023-03-17 LAB — URIC ACID: Uric Acid, Serum: 7.2 mg/dL (ref 4.0–7.8)

## 2023-03-17 MED ORDER — PREDNISONE 10 MG PO TABS: ORAL_TABLET | ORAL | 0 refills | Status: DC

## 2023-03-17 NOTE — Assessment & Plan Note (Signed)
Consistent with gout. Will treat with Prednisone taper due to Colchicine interaction with Digoxin. Will check uric acid and BMP. He can continue to rest, elevate and ice his ankle. He is aware of his dietary restrictions and limitations to help avoid flares. He will contact if his symptoms are not improving.

## 2023-03-17 NOTE — Progress Notes (Signed)
Bethanie Dicker, NP-C Phone: [phone]  Carlos Crosby is a 67 y.o. male who presents today for gout.   Gout: Patient here for evaluation of acute gouty arthritis. The patient reports onset of an acute gout attack involving the left ankle beginning 5 days ago, and being treated with none. Attacks occur primarily in the left ankle and left knee. He does note a long history of gout. He has not had a flare this bad in several years. Patient reports his pain is worse, his joint stiffness is worse and his joint swelling is worse. Limitation on activities include difficulty with walking. He is in a wheelchair today. He is having difficulty sleeping due to his pain. The patient is avoiding high purine foods and reports consuming 0 alcoholic drinks per year.   Social History   Tobacco Use  Smoking Status Never  Smokeless Tobacco Never    Current Outpatient Medications on File Prior to Visit  Medication Sig Dispense Refill   acetaminophen (TYLENOL) 500 MG tablet Take 1,000 mg by mouth every 6 (six) hours as needed.     apixaban (ELIQUIS) 5 MG TABS tablet Take 1 tablet (5 mg total) by mouth 2 (two) times daily. 180 tablet 1   Cholecalciferol (VITAMIN D) 2000 units CAPS daily.      digoxin (LANOXIN) 0.25 MG tablet TAKE 1 TABLET DAILY 90 tablet 2   furosemide (LASIX) 20 MG tablet Take 1 tablet (20 mg total) by mouth daily as needed for edema. 90 tablet 3   loratadine (CLARITIN) 10 MG tablet Take 10 mg by mouth daily.     metoprolol succinate (TOPROL-XL) 100 MG 24 hr tablet TAKE 2 TABLETS EVERY MORNING AND 1 TABLET EVERY EVENING 270 tablet 0   Multiple Vitamins-Minerals (CENTRUM SILVER ADULT 50+) TABS Take 1 tablet by mouth daily.     pravastatin (PRAVACHOL) 80 MG tablet TAKE 1 TABLET DAILY 90 tablet 0   sacubitril-valsartan (ENTRESTO) 24-26 MG Take 1 tablet by mouth 2 (two) times daily. 180 tablet 3   traMADol (ULTRAM) 50 MG tablet TAKE 1 TABLET(50 MG) BY MOUTH EVERY 6 HOURS AS NEEDED FOR PAIN 90  tablet 0   Current Facility-Administered Medications on File Prior to Visit  Medication Dose Route Frequency Provider Last Rate Last Admin   Study - [name] - olezarsen 50 mg, 80 mg or placebo SQ injection (PI-Hilty)  50 mg Subcutaneous Q28 days         ROS see history of present illness  Objective  Physical Exam Vitals:   03/17/23 0819  BP: 100/76  Pulse: 73  Temp: 97.8 F (36.6 C)  SpO2: 97%    BP Readings from Last 3 Encounters:  03/17/23 100/76  02/09/23 110/86  01/20/23 124/74   Wt Readings from Last 3 Encounters:  03/17/23 296 lb 12.8 oz (134.6 kg)  02/09/23 (!) 301 lb 12.8 oz (136.9 kg)  01/20/23 (!) 305 lb (138.3 kg)    Physical Exam Constitutional:      General: He is not in acute distress.    Appearance: Normal appearance.  HENT:     Head: Normocephalic.  Cardiovascular:     Rate and Rhythm: Normal rate. Rhythm irregularly irregular.     Heart sounds: Normal heart sounds.  Pulmonary:     Effort: Pulmonary effort is normal.     Breath sounds: Normal breath sounds.  Musculoskeletal:     Right ankle: Normal.     Left ankle: Swelling present. Tenderness (generalized) present. Decreased range of motion.  Comments: Erythema and warmth present.   Skin:    General: Skin is warm and dry.  Neurological:     General: No focal deficit present.     Mental Status: He is alert.  Psychiatric:        Mood and Affect: Mood normal.        Behavior: Behavior normal.    Assessment/Plan: Please see individual problem list.  Acute gout of left ankle, unspecified cause Assessment & Plan: Consistent with gout. Will treat with Prednisone taper due to Colchicine interaction with Digoxin. Will check uric acid and BMP. He can continue to rest, elevate and ice his ankle. He is aware of his dietary restrictions and limitations to help avoid flares. He will contact if his symptoms are not improving.   Orders: -     Uric acid -     Basic metabolic panel -      predniSONE; TAKE 4 TABLETS PO QD FOR 3 DAYS THEN TAKE 3 TABLETS PO QD FOR 3 DAYS THEN TAKE 2 TABLET PO QD FOR 3 DAYS THEN TAKE 1 TAB PO QD FOR 3 DAYS  Dispense: 30 tablet; Refill: 0   Return if symptoms worsen or fail to improve.   Bethanie Dicker, NP-C [facility] [employer]

## 2023-03-23 NOTE — Progress Notes (Signed)
Cardiology Office Note  Date:  03/24/2023   ID:  Carlos Crosby, DOB 1955-08-03, MRN [id number]  PCP:  Glori Luis, MD   Chief Complaint  Patient presents with   12 month follow up     "Doing well." Medications reviewed by the patient verbally.     HPI:  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 who presents for routine follow-up of his atrial fibrillation  Last seen in clinic by myself October 2023 Seen by EP July 2024, verapamil held for low blood pressure  Has had dramatic weight loss over the past year Feels better, less shortness of breath, more energy, Changed diet, reports eating less than 1200 cal/day  Weight 10/23 was 355, Now down to 294 today Plan is to continue weight loss until he reaches 240 pounds then reassess  BP on today's visit continues to run low, asymptomatic More gout flare-ups, wonders if this could be from the [place] Reports he is unable to tolerate allopurinol Recent uric acid level in the 7 range  Exercises at the gym, salt water pool  Continues to take low-dose Entresto, metoprolol succinate 200 in the morning 100 in the evening  Labs reviewed Total chol 127, LDL 70 A1C 5.4  Past medical history reviewed periodic phlebotomy for hemochromatosis Also with some polycythemia secondary to elevated iron stores  Seen by EP Dr. Graciela Husbands April 2021 Permanent atrial fibrillation, rate in the  80s Walking with a dog 2 to 3 days a week Dig level check  ECHO 11/24/2015: EF 45 to 50% Stress test : 12/21/2015 No ischemia, EF 42%  echo in 05/2015: EF 30 to 35%  outpatient left lithotripsy 08/28/2014  found to have atrial fibrillation with RVR with heart rates up to 170s prior to the procedure and this was canceled. He was kept overnight in the hospital and heart rate returned to normal on his regular medication regiment, metoprolol succinate 100 mg twice a day   PMH:   has a past medical history of Chronic a-fib (HCC), Chronic systolic CHF (congestive heart failure) (HCC), Depression, Edema (09/26/2014), Hemochromatosis, History of kidney stones, History of pulmonary embolism, Hyperlipidemia, Hypertension, Migraine, Obesity, OSA (obstructive sleep apnea), Osteoarthritis, and [place] spotted fever (2011).  PSH:    Past Surgical History:  Procedure Laterality Date   CARDIAC CATHETERIZATION  2012   [facility]   KNEE SURGERY     right knee    LITHOTRIPSY  07/31/2014   URETERAL STENT PLACEMENT  2016   URETEROSCOPY      Current Outpatient Medications  Medication Sig Dispense Refill   acetaminophen (TYLENOL) 500 MG tablet Take 1,000 mg by mouth every 6 (six) hours as needed.     apixaban (ELIQUIS) 5 MG TABS tablet Take 1 tablet (5 mg total) by mouth 2 (two) times daily. 180 tablet 1   Cholecalciferol (VITAMIN D) 2000 units CAPS daily.      digoxin (LANOXIN) 0.25 MG tablet TAKE 1 TABLET DAILY 90 tablet 2   furosemide (  LASIX) 20 MG tablet Take 1 tablet (20 mg total) by mouth daily as needed for edema. 90 tablet 3   loratadine (CLARITIN) 10 MG tablet Take 10 mg by mouth daily.     metoprolol succinate (TOPROL-XL) 100 MG 24 hr tablet TAKE 2 TABLETS EVERY MORNING AND 1 TABLET EVERY EVENING 270 tablet 0   Multiple Vitamins-Minerals (CENTRUM SILVER ADULT 50+) TABS Take 1 tablet by mouth daily.     pravastatin (PRAVACHOL) 80 MG tablet TAKE 1 TABLET DAILY 90 tablet 0   predniSONE (DELTASONE)  10 MG tablet TAKE 4 TABLETS PO QD FOR 3 DAYS THEN TAKE 3 TABLETS PO QD FOR 3 DAYS THEN TAKE 2 TABLET PO QD FOR 3 DAYS THEN TAKE 1 TAB PO QD FOR 3 DAYS 30 tablet 0   sacubitril-valsartan (ENTRESTO) 24-26 MG Take 1 tablet by mouth 2 (two) times daily. 180 tablet 3   traMADol (ULTRAM) 50 MG tablet TAKE 1 TABLET(50 MG) BY MOUTH EVERY 6 HOURS AS NEEDED FOR PAIN 90 tablet 0   Current Facility-Administered Medications  Medication Dose Route Frequency Provider Last Rate Last Admin   Study - [name] - olezarsen 50 mg, 80 mg or placebo SQ injection (PI-Hilty)  50 mg Subcutaneous Q28 days          Allergies:   Allopurinol, Diltiazem, Chlorhexidine, Peridex [chlorhexidine gluconate], Ondansetron, and Penicillins   Social History:  The patient  reports that he has never smoked. He has never used smokeless tobacco. He reports that he does not drink alcohol and does not use drugs.   Family History:   family history includes Arthritis in his father; Cancer in his father and paternal grandfather; Hypertension in his father; Thyroid disease in his mother.   Review of Systems: Review of Systems  Constitutional: Negative.   HENT: Negative.    Respiratory: Negative.    Cardiovascular: Negative.   Gastrointestinal: Negative.   Musculoskeletal:  Positive for joint pain.  Neurological: Negative.   Psychiatric/Behavioral: Negative.    All other systems reviewed and are negative.  PHYSICAL EXAM: VS:  BP 102/72 (BP Location: Left Arm, Patient Position: Sitting, Cuff Size: Large)   Pulse 65   Ht 5\' 11"  (1.803 m)   Wt 294 lb (133.4 kg)   SpO2 98%   BMI 41.00 kg/m  , BMI Body mass index is 41 kg/m. Constitutional:  oriented to person, place, and time. No distress.  HENT:  Head: Grossly normal Eyes:  no discharge. No scleral icterus.  Neck: No JVD, no carotid bruits  Cardiovascular: Regular rate and rhythm, no murmurs appreciated Pulmonary/Chest: Clear to auscultation bilaterally, no wheezes or  rails Abdominal: Soft.  no distension.  no tenderness.  Musculoskeletal: Normal range of motion Neurological:  normal muscle tone. Coordination normal. No atrophy Skin: Skin warm and dry Psychiatric: normal affect, pleasant  Recent Labs: 10/17/2022: ALT 16 01/11/2023: Hemoglobin 18.1; Platelets 170 03/17/2023: BUN 16; Creatinine, Ser 0.93; Potassium 4.5; Sodium 136    Lipid Panel Lab Results  Component Value Date   CHOL 127 07/08/2022   HDL 37.70 (L) 07/08/2022   LDLCALC 70 07/08/2022   TRIG 98.0 07/08/2022    Wt Readings from Last 3 Encounters:  03/24/23 294 lb (133.4 kg)  03/17/23 296 lb 12.8 oz (134.6 kg)  02/09/23 (!) 301 lb 12.8 oz (136.9 kg)     ASSESSMENT AND PLAN:  Permanent atrial fibrillation (HCC) Rate well-controlled, asymptomatic, rate today in the 60s Blood pressure running low, recommended he decrease metoprolol succinate down to  150 mg in the morning, 100 mg in the evening He will monitor heart rate and blood pressure at home  Essential hypertension Plan as above, slight decrease in his metoprolol succinate as above Low blood pressure likely from radical weight loss over the past year May need to decrease Entresto dosing in half if blood pressure continues to run low  Diastolic CHF Appears euvolemic, weight trending downward, stable BMP, Lasix as needed He does report drinking lots of water in effort to lose more weight  Pure hypercholesterolemia Stressed importance of exercise program, low carbohydrate diet Lipids checked through research protocol  Morbid obesity (HCC) Dramatic weight loss over the past year His plan is to get down to 240 pounds through calorie restriction  Anxiety Needs Xanax before procedures  Obstructive sleep apnea on CPAP  compliant on his CPAP  Chronic diastolic congestive heart failure (HCC) EF 50 to 55% On Entresto and metoprolol succinate,  Lasix as needed (rare) Blood pressure low, drop in metoprolol succinate as  above    Signed, Dossie Arbour, M.D., Ph.D. 03/24/2023  [facility] [place], [place] [phone]

## 2023-03-24 ENCOUNTER — Ambulatory Visit: Payer: Medicare (Managed Care) | Attending: Cardiovascular Disease | Admitting: Cardiovascular Disease

## 2023-03-24 ENCOUNTER — Encounter: Payer: Self-pay | Admitting: Cardiovascular Disease

## 2023-03-24 VITALS — BP 102/72 | HR 65 | Ht 71.0 in | Wt 294.0 lb

## 2023-03-24 DIAGNOSIS — I5032 Chronic diastolic (congestive) heart failure: Secondary | ICD-10-CM

## 2023-03-24 DIAGNOSIS — I428 Other cardiomyopathies: Secondary | ICD-10-CM | POA: Diagnosis not present

## 2023-03-24 DIAGNOSIS — G4733 Obstructive sleep apnea (adult) (pediatric): Secondary | ICD-10-CM

## 2023-03-24 DIAGNOSIS — I4821 Permanent atrial fibrillation: Secondary | ICD-10-CM

## 2023-03-24 DIAGNOSIS — I10 Essential (primary) hypertension: Secondary | ICD-10-CM

## 2023-03-24 MED ORDER — METOPROLOL SUCCINATE ER 100 MG PO TB24: ORAL_TABLET | ORAL | 3 refills | Status: DC

## 2023-03-24 NOTE — Patient Instructions (Addendum)
Medication Instructions:  Please decrease the metoprolol succinate down to 150 mg in the Am, stay on 100 mg in the evening  Please monitor blood pressure  If you need a refill on your cardiac medications before your next appointment, please call your pharmacy.   Lab work: No new labs needed  Testing/Procedures: No new testing needed  Follow-Up: At [facility], you and your health needs are our priority.  As part of our continuing mission to provide you with exceptional heart care, we have created designated Provider Care Teams.  These Care Teams include your primary Cardiologist (physician) and Advanced Practice Providers (APPs -  Physician Assistants and Nurse Practitioners) who all work together to provide you with the care you need, when you need it.  You will need a follow up appointment in 12 months  Providers on your designated Care Team:   Nicolasa Ducking, NP Eula Listen, PA-C [facility] [place], [place]  COVID-19 Vaccine Information can be found at: [email or web address] For questions related to vaccine distribution or appointments, please email [email or web address] or call [phone].

## 2023-03-28 ENCOUNTER — Telehealth: Payer: Self-pay

## 2023-03-28 NOTE — Telephone Encounter (Signed)
Talked with patient concerning gel injection.  Advised that PA is still pending.  Once approved by [facility], I will give him a call to schedule.  Patient voiced that he understands.

## 2023-03-31 ENCOUNTER — Encounter: Payer: Self-pay | Admitting: Cardiovascular Disease

## 2023-03-31 ENCOUNTER — Other Ambulatory Visit: Payer: Self-pay | Admitting: Family Medicine

## 2023-03-31 DIAGNOSIS — M170 Bilateral primary osteoarthritis of knee: Secondary | ICD-10-CM

## 2023-03-31 MED ORDER — TRAMADOL HCL 50 MG PO TABS
ORAL_TABLET | ORAL | 0 refills | Status: DC
Start: 2023-03-31 — End: 2023-05-08

## 2023-04-03 ENCOUNTER — Other Ambulatory Visit: Payer: Self-pay | Admitting: Cardiovascular Disease

## 2023-04-03 ENCOUNTER — Other Ambulatory Visit: Payer: Self-pay

## 2023-04-03 DIAGNOSIS — M170 Bilateral primary osteoarthritis of knee: Secondary | ICD-10-CM

## 2023-04-06 ENCOUNTER — Ambulatory Visit: Payer: Medicare (Managed Care) | Admitting: Physician Assistant

## 2023-04-06 ENCOUNTER — Encounter: Payer: Self-pay | Admitting: Physician Assistant

## 2023-04-06 DIAGNOSIS — M1711 Unilateral primary osteoarthritis, right knee: Secondary | ICD-10-CM

## 2023-04-06 DIAGNOSIS — M170 Bilateral primary osteoarthritis of knee: Secondary | ICD-10-CM

## 2023-04-06 DIAGNOSIS — M1712 Unilateral primary osteoarthritis, left knee: Secondary | ICD-10-CM

## 2023-04-06 MED ORDER — HYALURONAN 88 MG/4ML IX SOSY
88.0000 mg | PREFILLED_SYRINGE | INTRA_ARTICULAR | Status: AC | PRN
  Administered 2023-04-06: 88 mg via INTRA_ARTICULAR

## 2023-04-06 NOTE — Progress Notes (Signed)
Office Visit Note   Patient: Carlos Crosby           Date of Birth: Feb 10, 1956           MRN: [id number] Visit Date: 04/06/2023              Requested by: Glori Luis, MD [address] [place],  [place] [address] PCP: Glori Luis, MD  No chief complaint on file.     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  He comes in today for a Monovisc injection.  He has had these in the past and done well.  He has lost 80 pounds since January current BMI my is just over 47. Assessment & Plan: Visit Diagnoses: Osteoarthritis bilateral knees  Plan: Went forward with bilateral Monovisc injections today.  Will follow-up as needed will continue to work on weight loss.  He understands he needs to get his BMI below 40 to be considered for knee replacement surgery.  Follow-Up Instructions: No follow-ups on file.   Ortho Exam  Patient is alert, oriented, no adenopathy, well-dressed, normal affect, normal respiratory effort. Bilateral knees no erythema compartments are soft and compressible no effusions significant varus deformities bilaterally  Imaging: No results found. No images are attached to the encounter.  Labs: Lab Results  Component Value Date   HGBA1C 5.4 07/08/2022   HGBA1C 5.4 03/29/2021   HGBA1C 5.3 02/25/2020   LABURIC 7.2 03/17/2023   LABURIC 6.8 03/29/2021   LABURIC 7.8 07/11/2017     Lab Results  Component Value Date   ALBUMIN 4.3 10/17/2022   ALBUMIN 4.7 07/12/2021   ALBUMIN 4.4 03/29/2021    No results found for: "MG" Lab Results  Component Value Date   VD25OH 43.76 05/18/2018   VD25OH 32.32 01/15/2018   VD25OH 22.14 (L) 11/16/2017    No results found for: "PREALBUMIN"    Latest Ref Rng & Units 01/11/2023    8:50 AM 09/12/2022    1:06 PM 05/11/2022    1:01 PM  CBC EXTENDED  WBC 4.0 - 10.5 K/uL 5.8  7.4  8.0   RBC 4.22 - 5.81 MIL/uL 5.46  5.29  5.25   Hemoglobin 13.0 -  17.0 g/dL 56.4  33.2  95.1   HCT 39.0 - 52.0 % 51.9  50.9  50.2   Platelets 150 - 400 K/uL 170  190  198   NEUT# 1.7 - 7.7 K/uL 3.3  4.2  4.3   Lymph# 0.7 - 4.0 K/uL 1.9  2.4  2.7      There is no height or weight on file to calculate BMI.  Orders:  No orders of the defined types were placed in this encounter.  No orders of the defined types were placed in this encounter.    Procedures: Large Joint Inj: bilateral knee on 04/06/2023 9:11 AM Indications: pain and diagnostic evaluation Details: 1.5 in anterolateral approach  Arthrogram: No  Medications (Right): 88 mg Hyaluronan 88 MG/4ML Medications (Left): 88 mg Hyaluronan 88 MG/4ML Outcome: tolerated well, no immediate complications Procedure, treatment alternatives, risks and benefits explained, specific risks discussed. Consent was given by the patient.    Clinical Data: No additional findings.  ROS:  All other systems negative, except as noted in the HPI. Review of Systems  Objective: Vital Signs: There were no vitals taken for this visit.  Specialty Comments:  No specialty comments available.  PMFS History: Patient Active  Problem List   Diagnosis Date Noted  . Acute gout of left ankle 03/17/2023  . Actinic keratoses 10/17/2022  . Elevated MCV 10/19/2021  . Submandibular swelling 09/28/2021  . History of gout 03/29/2021  . Cough 10/09/2020  . Morbid (severe) obesity due to excess calories (HCC) 09/29/2020  . Trigger finger, left ring finger 05/07/2020  . Chronic pain of left hand 04/15/2020  . Routine general medical examination at a health care facility 02/25/2020  . Rectal pain 10/18/2019  . Rash 10/08/2019  . Fall 02/05/2019  . Neuropathy 11/16/2017  . Prediabetes 11/16/2017  . Vitamin D deficiency 11/16/2017  . Hemochromatosis, hereditary (HCC) 07/23/2017  . Bleeding nose 05/18/2017  . Allergic rhinitis 01/26/2017  . Blue nevus 05/16/2016  . Hair loss 05/16/2016  . Abdominal pain 05/16/2016  .  Umbilical hernia without obstruction and without gangrene 05/16/2016  . Atrial fibrillation (HCC) 09/26/2014  . Chronic diastolic congestive heart failure (HCC) 09/26/2014  . Anxiety 09/26/2014  . Obstructive sleep apnea on CPAP 09/26/2014  . Essential hypertension 09/26/2014  . Hyperlipidemia 09/26/2014  . Osteoarthritis of both knees 09/26/2014  . Kidney stone 09/26/2014   Past Medical History:  Diagnosis Date  . Chronic a-fib (HCC)   . Chronic systolic CHF (congestive heart failure) (HCC)   . Depression   . Edema 09/26/2014  . Hemochromatosis   . History of kidney stones   . History of pulmonary embolism   . Hyperlipidemia   . Hypertension   . Migraine   . Obesity   . OSA (obstructive sleep apnea)    on CPAP  . Osteoarthritis    bilateral knees  . [place] spotted fever 2011    Family History  Problem Relation Age of Onset  . Thyroid disease Mother   . Cancer Father        lung  . Arthritis Father   . Hypertension Father   . Cancer Paternal Grandfather        Throat cancer    Past Surgical History:  Procedure Laterality Date  . CARDIAC CATHETERIZATION  2012   [facility]  . KNEE SURGERY     right knee   . LITHOTRIPSY  07/31/2014  . URETERAL STENT PLACEMENT  2016  . URETEROSCOPY     Social History   Occupational History  . Not on file  Tobacco Use  . Smoking status: Never  . Smokeless tobacco: Never  Vaping Use  . Vaping status: Never Used  Substance and Sexual Activity  . Alcohol use: No    Comment: occasional; 3 times a year  . Drug use: No  . Sexual activity: Not Currently

## 2023-04-07 ENCOUNTER — Encounter: Payer: Self-pay | Admitting: Cardiovascular Disease

## 2023-04-07 ENCOUNTER — Encounter: Payer: Self-pay | Admitting: *Deleted

## 2023-04-07 DIAGNOSIS — Z006 Encounter for examination for normal comparison and control in clinical research program: Secondary | ICD-10-CM

## 2023-04-07 NOTE — Research (Signed)
Spoke with Carlos Crosby as part of [name] follow up week 8. He reports no abd pain, no visits to the ED, but does report a change in his dose of metoprolol. Metoprolol was changed to 150 mg in the morning and 100 mg in the evening.(By Dr Mariah Milling)  This change was due to his weight loss, a decrease in his BP, and decrease in his Heart rate.  Scheduled next appointment for Dec 5th at 0900.  Current Outpatient Medications:    acetaminophen (TYLENOL) 500 MG tablet, Take 1,000 mg by mouth every 6 (six) hours as needed., Disp: , Rfl:    apixaban (ELIQUIS) 5 MG TABS tablet, Take 1 tablet (5 mg total) by mouth 2 (two) times daily., Disp: 180 tablet, Rfl: 1   Cholecalciferol (VITAMIN D) 2000 units CAPS, daily. , Disp: , Rfl:    digoxin (LANOXIN) 0.25 MG tablet, TAKE 1 TABLET DAILY, Disp: 90 tablet, Rfl: 3   furosemide (LASIX) 20 MG tablet, Take 1 tablet (20 mg total) by mouth daily as needed for edema., Disp: 90 tablet, Rfl: 3   loratadine (CLARITIN) 10 MG tablet, Take 10 mg by mouth daily., Disp: , Rfl:    metoprolol succinate (TOPROL-XL) 100 MG 24 hr tablet, TAKE 1.5 TABLETS (150 MG) EVERY MORNING AND 1 TABLET (100 MG) EVERY EVENING, Disp: 225 tablet, Rfl: 3   Multiple Vitamins-Minerals (CENTRUM SILVER ADULT 50+) TABS, Take 1 tablet by mouth daily., Disp: , Rfl:    pravastatin (PRAVACHOL) 80 MG tablet, TAKE 1 TABLET DAILY, Disp: 90 tablet, Rfl: 0   predniSONE (DELTASONE) 10 MG tablet, TAKE 4 TABLETS PO QD FOR 3 DAYS THEN TAKE 3 TABLETS PO QD FOR 3 DAYS THEN TAKE 2 TABLET PO QD FOR 3 DAYS THEN TAKE 1 TAB PO QD FOR 3 DAYS, Disp: 30 tablet, Rfl: 0   sacubitril-valsartan (ENTRESTO) 24-26 MG, Take 1 tablet by mouth 2 (two) times daily., Disp: 180 tablet, Rfl: 3   traMADol (ULTRAM) 50 MG tablet, TAKE 1 TABLET(50 MG) BY MOUTH EVERY 6 HOURS AS NEEDED FOR PAIN, Disp: 90 tablet, Rfl: 0  Current Facility-Administered Medications:    Study - [name] - olezarsen 50 mg, 80 mg or placebo SQ injection (PI-Hilty), 50 mg,  Subcutaneous, Q28 days,

## 2023-04-10 ENCOUNTER — Other Ambulatory Visit: Payer: Self-pay | Admitting: Cardiovascular Disease

## 2023-04-17 ENCOUNTER — Encounter: Payer: Self-pay | Admitting: Oncology

## 2023-04-23 ENCOUNTER — Encounter: Payer: Self-pay | Admitting: Oncology

## 2023-04-24 ENCOUNTER — Encounter: Payer: Self-pay | Admitting: Family Medicine

## 2023-04-24 ENCOUNTER — Ambulatory Visit (INDEPENDENT_AMBULATORY_CARE_PROVIDER_SITE_OTHER): Payer: Medicare (Managed Care) | Admitting: Family Medicine

## 2023-04-24 VITALS — BP 112/76 | HR 89 | Temp 97.9°F | Ht 71.0 in | Wt 293.4 lb

## 2023-04-24 DIAGNOSIS — E559 Vitamin D deficiency, unspecified: Secondary | ICD-10-CM

## 2023-04-24 DIAGNOSIS — Z23 Encounter for immunization: Secondary | ICD-10-CM

## 2023-04-24 DIAGNOSIS — M170 Bilateral primary osteoarthritis of knee: Secondary | ICD-10-CM | POA: Diagnosis not present

## 2023-04-24 DIAGNOSIS — R7303 Prediabetes: Secondary | ICD-10-CM

## 2023-04-24 DIAGNOSIS — E782 Mixed hyperlipidemia: Secondary | ICD-10-CM | POA: Diagnosis not present

## 2023-04-24 DIAGNOSIS — L570 Actinic keratosis: Secondary | ICD-10-CM | POA: Diagnosis not present

## 2023-04-24 DIAGNOSIS — I4821 Permanent atrial fibrillation: Secondary | ICD-10-CM | POA: Diagnosis not present

## 2023-04-24 DIAGNOSIS — Z8739 Personal history of other diseases of the musculoskeletal system and connective tissue: Secondary | ICD-10-CM

## 2023-04-24 DIAGNOSIS — I10 Essential (primary) hypertension: Secondary | ICD-10-CM

## 2023-04-24 LAB — LIPID PANEL
Cholesterol: 168 mg/dL (ref 0–200)
HDL: 38.8 mg/dL — ABNORMAL LOW (ref >39.00)
LDL Cholesterol: 99 mg/dL (ref 0–99)
NonHDL: 129.12
Total CHOL/HDL Ratio: 4
Triglycerides: 151 mg/dL — ABNORMAL HIGH (ref 0.0–149.0)
VLDL: 30.2 mg/dL (ref 0.0–40.0)

## 2023-04-24 LAB — VITAMIN D 25 HYDROXY (VIT D DEFICIENCY, FRACTURES): VITD: 39.87 ng/mL (ref 30.00–100.00)

## 2023-04-24 LAB — HEMOGLOBIN A1C: Hgb A1c MFr Bld: 5.7 % (ref 4.6–6.5)

## 2023-04-24 MED ORDER — PREDNISONE 10 MG PO TABS: ORAL_TABLET | ORAL | 0 refills | Status: DC

## 2023-04-24 NOTE — Assessment & Plan Note (Signed)
Chronic issue.  BP has improved with weight loss.  He will continue metoprolol 150 mg in the morning and 100 mg in the evening and he will continue Entresto half a tablet twice daily.

## 2023-04-24 NOTE — Assessment & Plan Note (Signed)
Recent flare.  Will provide prescription for prednisone to have on hand to take at the first sign of a gout flare.  If he has to take this he will let us know.

## 2023-04-24 NOTE — Assessment & Plan Note (Signed)
Chronic issue.  Patient has completed fluorouracil.  Advised his face and scalp would start to look better in the next several weeks.  He will continue to see dermatology.

## 2023-04-24 NOTE — Assessment & Plan Note (Signed)
Check vitamin D. 

## 2023-04-24 NOTE — Progress Notes (Signed)
Carlos Alar, MD Phone: [phone]  Carlos Crosby is a 67 y.o. male who presents today for follow-up.  Right knee pain: Patient notes this is stable.  He takes tramadol 1-2 times a day.  It does not make him drowsy.  Gout: Patient is not sure how frequently he has flares.  He was recently treated for a flare with prednisone.  He is not able to take allopurinol given significant allergic reaction previously.  He cannot take colchicine given medication interaction.  He is not able to take NSAIDs given that he is on Eliquis.  Prednisone worked well for him.  He is requesting to have this on hand to take as needed for gout flares.  Obesity: Patient continues to work on weight loss.  He is down 12 pounds over the last few months.  He is restricting calorie intake.  Patient  has been using fluorouracil for precancerous skin lesions treatment on his face and scalp.  Patient reports they have cut his metoprolol and Entresto doses down given low blood pressures.  Blood pressure has been averaging 100/74 over the last 18 days.  Social History   Tobacco Use  Smoking Status Never  Smokeless Tobacco Never    Current Outpatient Medications on File Prior to Visit  Medication Sig Dispense Refill   acetaminophen (TYLENOL) 500 MG tablet Take 1,000 mg by mouth every 6 (six) hours as needed.     apixaban (ELIQUIS) 5 MG TABS tablet Take 1 tablet (5 mg total) by mouth 2 (two) times daily. 180 tablet 1   Cholecalciferol (VITAMIN D) 2000 units CAPS daily.      digoxin (LANOXIN) 0.25 MG tablet TAKE 1 TABLET DAILY 90 tablet 3   furosemide (LASIX) 20 MG tablet Take 1 tablet (20 mg total) by mouth daily as needed for edema. 90 tablet 3   loratadine (CLARITIN) 10 MG tablet Take 10 mg by mouth daily.     metoprolol succinate (TOPROL-XL) 100 MG 24 hr tablet TAKE 1.5 TABLETS (150 MG) EVERY MORNING AND 1 TABLET (100 MG) EVERY EVENING 225 tablet 3   Multiple Vitamins-Minerals (CENTRUM SILVER ADULT 50+) TABS Take  1 tablet by mouth daily.     pravastatin (PRAVACHOL) 80 MG tablet TAKE 1 TABLET DAILY 90 tablet 3   sacubitril-valsartan (ENTRESTO) 24-26 MG Take 1 tablet by mouth 2 (two) times daily. 180 tablet 3   traMADol (ULTRAM) 50 MG tablet TAKE 1 TABLET(50 MG) BY MOUTH EVERY 6 HOURS AS NEEDED FOR PAIN 90 tablet 0   Current Facility-Administered Medications on File Prior to Visit  Medication Dose Route Frequency Provider Last Rate Last Admin   Study - [name] - olezarsen 50 mg, 80 mg or placebo SQ injection (PI-Hilty)  50 mg Subcutaneous Q28 days          ROS see history of present illness  Objective  Physical Exam Vitals:   04/24/23 0811  BP: 112/76  Pulse: 89  Temp: 97.9 F (36.6 C)  SpO2: 97%    BP Readings from Last 3 Encounters:  04/24/23 112/76  03/24/23 102/72  03/17/23 100/76   Wt Readings from Last 3 Encounters:  04/24/23 293 lb 6.4 oz (133.1 kg)  03/24/23 294 lb (133.4 kg)  03/17/23 296 lb 12.8 oz (134.6 kg)    Physical Exam Constitutional:      General: He is not in acute distress.    Appearance: He is not diaphoretic.  Cardiovascular:     Rate and Rhythm: Normal rate. Rhythm irregularly irregular.  Heart sounds: Normal heart sounds.  Pulmonary:     Effort: Pulmonary effort is normal.     Breath sounds: Normal breath sounds.  Skin:    General: Skin is warm and dry.     Comments: Erythematous rash on face and scalp  Neurological:     Mental Status: He is alert.      Assessment/Plan: Please see individual problem list.  Permanent atrial fibrillation [facility]) Assessment & Plan: Chronic issue.  Rate controlled.  He will continue metoprolol 150 mg in the morning and 100 mg at night as well as digoxin 1 tablet daily which is managed by cardiology.   Essential hypertension Assessment & Plan: Chronic issue.  BP has improved with weight loss.  He will continue metoprolol 150 mg in the morning and 100 mg in the evening and he will continue Entresto half a tablet  twice daily.   Primary osteoarthritis of both knees Assessment & Plan: Chronic issue.  Stable.  Patient can continue tramadol 1 tablet by mouth every 6 hours as needed for pain.   Hemochromatosis, hereditary (HCC) Assessment & Plan: Chronic issue.  Patient will continue to see hematology.   Morbid (severe) obesity due to excess calories [name]) Assessment & Plan: Chronic issue.  Continue dietary changes.  Congratulated on weight loss.   Prediabetes -     Hemoglobin A1c  Vitamin D deficiency Assessment & Plan: Check vitamin D.  Orders: -     VITAMIN D 25 Hydroxy (Vit-D Deficiency, Fractures)  History of gout Assessment & Plan: Recent flare.  Will provide prescription for prednisone to have on hand to take at the first sign of a gout flare.  If he has to take this he will let us know.  Orders: -     predniSONE; TAKE 4 TABLETS PO QD FOR 3 DAYS THEN TAKE 3 TABLETS PO QD FOR 3 DAYS THEN TAKE 2 TABLET PO QD FOR 3 DAYS THEN TAKE 1 TAB PO QD FOR 3 DAYS.  Start at the first sign of a gout flare.  Dispense: 30 tablet; Refill: 0  Encounter for administration of vaccine -     Flu Vaccine Trivalent High Dose (Fluad)  Mixed hyperlipidemia Assessment & Plan: Chronic issue.  Patient will continue pravastatin 80 mg daily.  He completed a research study  through 3 months ago.  Will check lipid panel to see where his triglycerides are.  Orders: -     Lipid panel  Actinic keratoses Assessment & Plan: Chronic issue.  Patient has completed fluorouracil.  Advised his face and scalp would start to look better in the next several weeks.  He will continue to see dermatology.     Return in about 3 months (around 07/25/2023) for With [facility] for transfer of care.   Carlos Alar, MD [facility]

## 2023-04-24 NOTE — Assessment & Plan Note (Signed)
Chronic issue.  Patient will continue to see hematology.

## 2023-04-24 NOTE — Assessment & Plan Note (Signed)
Chronic issue.  Patient will continue pravastatin 80 mg daily.  He completed a research study  through 3 months ago.  Will check lipid panel to see where his triglycerides are.

## 2023-04-24 NOTE — Assessment & Plan Note (Signed)
Chronic issue.  Stable.  Patient can continue tramadol 1 tablet by mouth every 6 hours as needed for pain.

## 2023-04-24 NOTE — Assessment & Plan Note (Signed)
Chronic issue.  Rate controlled.  He will continue metoprolol 150 mg in the morning and 100 mg at night as well as digoxin 1 tablet daily which is managed by cardiology.

## 2023-04-24 NOTE — Assessment & Plan Note (Signed)
Chronic issue.  Continue dietary changes.  Congratulated on weight loss.

## 2023-05-06 IMAGING — DX DG ABDOMEN 1V
2 series · 2 of 2 positions shown · non-contrast
Comparison: Radiograph 03/15/2019.  CT 07/24/2014

CLINICAL DATA: Left-sided kidney stone.

EXAM:
ABDOMEN - 1 VIEW

[abdomen kub (1 of 2)]
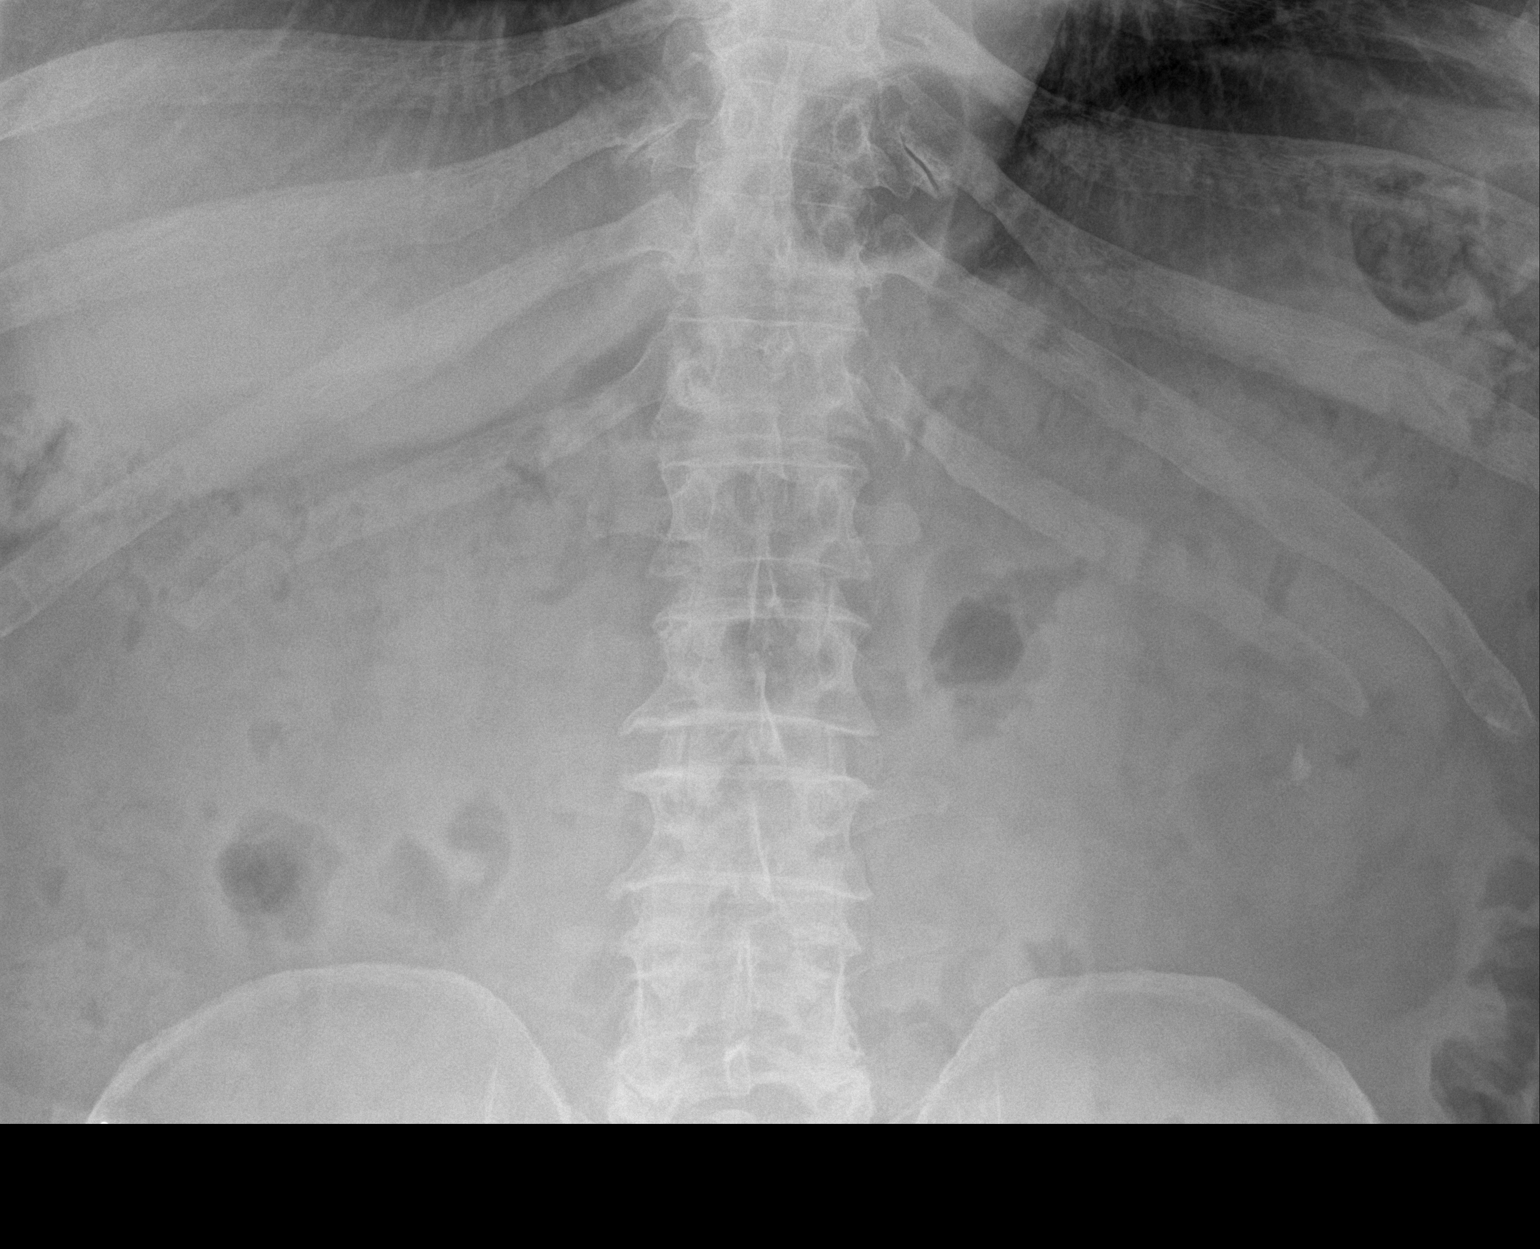

[abdomen kub (2 of 2)]
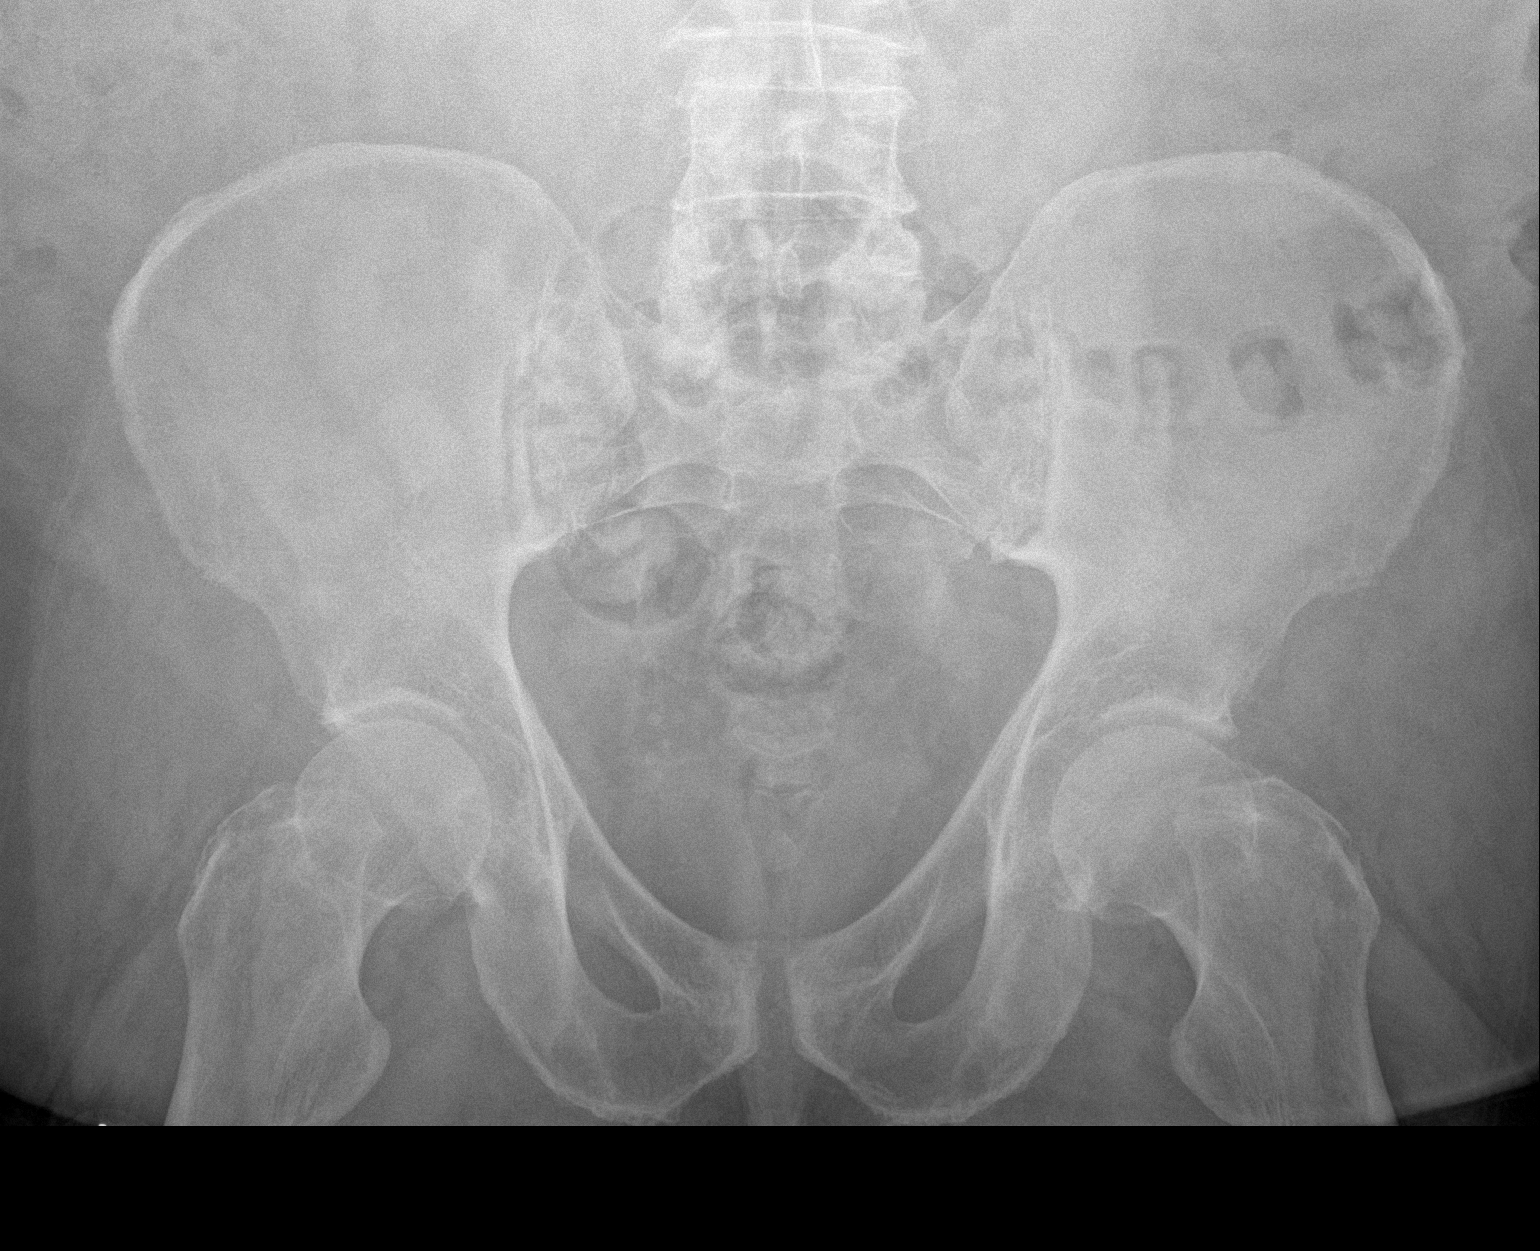

[2 of 2 positions shown; findings below may reference images not displayed]

FINDINGS: Calcifications projecting over the left renal shadow, more confluent
than on prior exam spanning approximately 9 mm. There is a small air
adjacent stone in the lower left kidney. No visualized right
urolithiasis. No evidence of ureteral stone. Calcifications in the
pelvis are stable from prior exam and typical of phleboliths. Normal
bowel gas pattern with small to moderate colonic stool burden. No
concerning intraabdominal mass effect. Soft tissue attenuation from
habitus limits detailed assessment
IMPRESSION: Left nephrolithiasis, stones either more confluent or overlapping
compared with prior, currently measuring 9 mm.

## 2023-05-08 ENCOUNTER — Encounter: Payer: Self-pay | Admitting: Family Medicine

## 2023-05-08 DIAGNOSIS — M170 Bilateral primary osteoarthritis of knee: Secondary | ICD-10-CM

## 2023-05-08 MED ORDER — TRAMADOL HCL 50 MG PO TABS: ORAL_TABLET | ORAL | 0 refills | Status: DC

## 2023-05-11 ENCOUNTER — Encounter: Payer: Medicare (Managed Care) | Admitting: *Deleted

## 2023-05-11 ENCOUNTER — Other Ambulatory Visit: Payer: Self-pay

## 2023-05-11 ENCOUNTER — Other Ambulatory Visit: Payer: Self-pay | Admitting: *Deleted

## 2023-05-11 VITALS — BP 128/86 | HR 72 | Temp 97.4°F | Resp 18 | Wt 290.0 lb

## 2023-05-11 DIAGNOSIS — Z006 Encounter for examination for normal comparison and control in clinical research program: Secondary | ICD-10-CM

## 2023-05-11 NOTE — Research (Addendum)
Carlos Crosby Post week 13/Day 91 visit 11-May-2023          Chemistry: Hs-C-Reactive Protein  6.1 mg/L               [x] Clinically Significant  [] Not Clinically Significant    Hematology: MCV 98.7                                                   [] Clinically Significant  [x] Not Clinically Significant                          Urinalysis: Protein 20 mg/dL                                       [] Clinically Significant  [x] Not Clinically Significant Leukocyte Estrace 25                                [] Clinically Significant  [x] Not Clinically Significant Urinary White Blood Cells 16-29               [] Clinically Significant  [x] Not Clinically Significant Mucus 1+                                                  [] Clinically Significant  [] Not Clinically Significant  Urine Chemistry: Urine Albumin  7.72 mg/dL                         [] Clinically Significant  [x] Not Clinically Significant Albumin Creatinine Ratio  73 mg/g             [] Clinically Significant  [x] Not Clinically Significant    Any further action needed to be taken per the PI? No  Elevated HS-CRP - unknown significance  Carlos Nose, MD, [facility], FACP  [place]  [facility]  Medical Director of the Advanced Lipid Disorders &  Cardiovascular Risk Reduction Clinic Diplomate of the American Board of Clinical Lipidology Attending Cardiologist  Direct Dial: [phone]  Fax: [phone]  Website:  [email or web address]        Post Week 13/ Day 91 Visit     Subject Number: [id number]              Randomization Number:[id number]            Date:11-May-2023      [x] Vital Signs Collected - Blood Pressure:128/86 - Weight:290 lbs - Heart Rate:72 - Respiratory Rate:18 - Temperature:97.4 - Oxygen Saturation:98%  [x]  Physical Exam Completed by PI or SUB-I  [x]  12-lead ECG  [x]  Extended Urinalysis   [x]  Lab collection per protocol  [x]  (abdominal pain only) since last visit  [x]  Assessment of ER  Visits, Hospitalizations, and Inpatient Days  [x]  Adverse Events and Concomitant Medications  [x]  Diet, Lifestyle, and Alcohol Counseling   Carlos Crosby is here for Post week 13 Post day 91 visit. He reports no abd pain and no visits to the ed or urgent care. He reports a gout attack in Oct. Took prednisone  for this.  Also reports using fluorouracil for pre skin cancers. Blood drawn at 0903. Urine obtained at 0855. VS taken at 0847.Dr Riley Kill did exam.   Current Outpatient Medications:    acetaminophen (TYLENOL) 500 MG tablet, Take 1,000 mg by mouth every 6 (six) hours as needed., Disp: , Rfl:    apixaban (ELIQUIS) 5 MG TABS tablet, Take 1 tablet (5 mg total) by mouth 2 (two) times daily., Disp: 180 tablet, Rfl: 1   Cholecalciferol (VITAMIN D) 2000 units CAPS, daily. , Disp: , Rfl:    digoxin (LANOXIN) 0.25 MG tablet, TAKE 1 TABLET DAILY, Disp: 90 tablet, Rfl: 3   furosemide (LASIX) 20 MG tablet, Take 1 tablet (20 mg total) by mouth daily as needed for edema., Disp: 90 tablet, Rfl: 3   loratadine (CLARITIN) 10 MG tablet, Take 10 mg by mouth daily., Disp: , Rfl:    metoprolol succinate (TOPROL-XL) 100 MG 24 hr tablet, TAKE 1.5 TABLETS (150 MG) EVERY MORNING AND 1 TABLET (100 MG) EVERY EVENING, Disp: 225 tablet, Rfl: 3   Multiple Vitamins-Minerals (CENTRUM SILVER ADULT 50+) TABS, Take 1 tablet by mouth daily., Disp: , Rfl:    pravastatin (PRAVACHOL) 80 MG tablet, TAKE 1 TABLET DAILY, Disp: 90 tablet, Rfl: 3   sacubitril-valsartan (ENTRESTO) 24-26 MG, Take 1 tablet by mouth 2 (two) times daily., Disp: 180 tablet, Rfl: 3   traMADol (ULTRAM) 50 MG tablet, TAKE 1 TABLET(50 MG) BY MOUTH EVERY 6 HOURS AS NEEDED FOR PAIN, Disp: 90 tablet, Rfl: 0   predniSONE (DELTASONE) 10 MG tablet, TAKE 4 TABLETS PO QD FOR 3 DAYS THEN TAKE 3 TABLETS PO QD FOR 3 DAYS THEN TAKE 2 TABLET PO QD FOR 3 DAYS THEN TAKE 1 TAB PO QD FOR 3 DAYS.  Start at the first sign of a gout flare. (Patient not taking: Reported on 05/11/2023),  Disp: 30 tablet, Rfl: 0  Current Facility-Administered Medications:    Study - [name] - olezarsen 50 mg, 80 mg or placebo SQ injection (PI-Hilty), 50 mg, Subcutaneous, Q28 days,

## 2023-05-11 NOTE — Progress Notes (Addendum)
  Mr Carlos Crosby continues to do well.  He denies major symptoms, having lost quite a bit of weight with concomitant reduction in the amount of medication he is taking.  He is off Verapamil and his metoprolol for rate control has been reduced.  He did have a bout of gout in his left ankle.  He also had some precancerous skin lesions removed.   He know has been off IP for three months and does not notice a change in functional status other than the benefits of weight reduction.     Alert, oriented talkative male in NAD ([employer] fan)  BP 128/86  BP Location Left Arm  Patient Position Sitting  Cuff Size Large  Pulse 72  Resp 18  Temp 97.4 F (36.3 C)  Temp src Temporal  SpO2 98 %  Weight 290 lb (131.5 kg)   Lungs clear Cor  irregularly irregular with decent rate control.  No obvious murmurs. Abd soft without obvious hepatosplenomegaly Ext no definite edema Neuro is non focal.  ECG  -atrial fib with well controlled ventricular rate.  Leftward axis. Low voltage QRS.  No significant change in tracing prior of 12/23/2022.  Impression  Chronic and persistent atrial fibrillation  Obstructive sleep apnea  Hyperlipidemia   Heterozygous hemachromatosis  Mildly reduced EF Essential hypertension Other problems as noted  Plan   Continue follow up with Dr. Mariah Milling  Discussed trial and potential results, and new potential studies directed at same  [facility] D. Riley Kill, MD, [facility] Medical Director, [facility]

## 2023-05-15 ENCOUNTER — Telehealth: Payer: Self-pay | Admitting: *Deleted

## 2023-05-15 ENCOUNTER — Other Ambulatory Visit: Payer: Self-pay | Admitting: *Deleted

## 2023-05-15 ENCOUNTER — Inpatient Hospital Stay: Payer: Medicare (Managed Care) | Attending: Oncology

## 2023-05-15 ENCOUNTER — Inpatient Hospital Stay: Payer: Medicare (Managed Care)

## 2023-05-15 DIAGNOSIS — D751 Secondary polycythemia: Secondary | ICD-10-CM

## 2023-05-15 LAB — CMP (CANCER CENTER ONLY)
ALT: 18 U/L (ref 0–44)
AST: 19 U/L (ref 15–41)
Albumin: 4.1 g/dL (ref 3.5–5.0)
Alkaline Phosphatase: 42 U/L (ref 38–126)
Anion gap: 12 (ref 5–15)
BUN: 27 mg/dL — ABNORMAL HIGH (ref 8–23)
CO2: 26 mmol/L (ref 22–32)
Calcium: 9.1 mg/dL (ref 8.9–10.3)
Chloride: 98 mmol/L (ref 98–111)
Creatinine: 0.99 mg/dL (ref 0.61–1.24)
GFR, Estimated: >60 mL/min (ref >60)
Glucose, Bld: 101 mg/dL — ABNORMAL HIGH (ref 70–99)
Potassium: 4.3 mmol/L (ref 3.5–5.1)
Sodium: 136 mmol/L (ref 135–145)
Total Bilirubin: 1 mg/dL (ref <1.2)
Total Protein: 7.1 g/dL (ref 6.5–8.1)

## 2023-05-15 LAB — CBC WITH DIFFERENTIAL/PLATELET
Abs Immature Granulocytes: 0.04 10*3/uL (ref 0.00–0.07)
Basophils Absolute: 0.1 10*3/uL (ref 0.0–0.1)
Basophils Relative: 1 %
Eosinophils Absolute: 0.2 10*3/uL (ref 0.0–0.5)
Eosinophils Relative: 3 %
HCT: 49.9 % (ref 39.0–52.0)
Hemoglobin: 17.3 g/dL — ABNORMAL HIGH (ref 13.0–17.0)
Immature Granulocytes: 1 %
Lymphocytes Relative: 40 %
Lymphs Abs: 3 10*3/uL (ref 0.7–4.0)
MCH: 33.5 pg (ref 26.0–34.0)
MCHC: 34.7 g/dL (ref 30.0–36.0)
MCV: 96.7 fL (ref 80.0–100.0)
Monocytes Absolute: 0.6 10*3/uL (ref 0.1–1.0)
Monocytes Relative: 8 %
Neutro Abs: 3.7 10*3/uL (ref 1.7–7.7)
Neutrophils Relative %: 47 %
Platelets: 171 10*3/uL (ref 150–400)
RBC: 5.16 MIL/uL (ref 4.22–5.81)
RDW: 13.5 % (ref 11.5–15.5)
WBC: 7.7 10*3/uL (ref 4.0–10.5)
nRBC: 0 % (ref 0.0–0.2)

## 2023-05-15 LAB — FERRITIN: Ferritin: 103 ng/mL (ref 24–336)

## 2023-05-15 NOTE — Progress Notes (Signed)
Patient tolatered phlebotomy well today, performed in RAC using 20g angiocath. 500 cc removed as ordered. Last ferritin 12/9 per :Lauren proceed without labs. No concerns voiced. Snack and po fluids offered. Stable at discharge. AVS given.

## 2023-05-15 NOTE — Telephone Encounter (Signed)
Patient has not had any labs drawn since August. Phlebotomy is normally based off the ferritin level. I spoke with Dr. Orlie Dakin. Per DR. Finnegan- "I would prefer pt to have labs, but since his hgb is persistently elevated and we do the phlebotomy pretty infrequently, ok to proceed w/phlebotomy w/o the ferritin levels." I called patient. He stated that he would prefer to proceed with phlebotomy today based on current symptoms of fatigue. Pt thanked me for calling him.

## 2023-05-16 LAB — AFP TUMOR MARKER: AFP, Serum, Tumor Marker: 3 ng/mL (ref 0.0–8.4)

## 2023-05-25 DIAGNOSIS — G4733 Obstructive sleep apnea (adult) (pediatric): Secondary | ICD-10-CM | POA: Diagnosis not present

## 2023-05-29 ENCOUNTER — Encounter: Payer: Self-pay | Admitting: Cardiovascular Disease

## 2023-06-01 ENCOUNTER — Encounter: Payer: Self-pay | Admitting: Cardiovascular Disease

## 2023-06-01 ENCOUNTER — Other Ambulatory Visit: Payer: Self-pay

## 2023-06-01 DIAGNOSIS — I4891 Unspecified atrial fibrillation: Secondary | ICD-10-CM

## 2023-06-01 MED ORDER — APIXABAN 5 MG PO TABS: 5.0000 mg | ORAL_TABLET | Freq: Two times a day (BID) | ORAL | 1 refills | Status: DC

## 2023-06-01 NOTE — Telephone Encounter (Signed)
Per Sherie Don, NP  Okay to refill Eliquis

## 2023-06-01 NOTE — Telephone Encounter (Signed)
Prescription refill request for Eliquis received. Indication: Afib  Last office visit: 03/24/23 Mariah Milling)  Scr: 0.99 (05/15/23)  Age: 67 Weight: 131.5kg  Eliquis 5mg  BID is appropriate dose.  Received request from [email or web address])  Fax: [phone] Phone: [phone]  Anticoagulation clinic unable to send prescription to [facility] pharmacies. Will route to MD/RN for printed prescription signed by pt's MD.

## 2023-06-02 ENCOUNTER — Other Ambulatory Visit: Payer: Self-pay

## 2023-06-10 ENCOUNTER — Other Ambulatory Visit: Payer: Self-pay | Admitting: Family Medicine

## 2023-06-10 DIAGNOSIS — M170 Bilateral primary osteoarthritis of knee: Secondary | ICD-10-CM

## 2023-06-12 MED ORDER — TRAMADOL HCL 50 MG PO TABS: ORAL_TABLET | ORAL | 0 refills | Status: DC

## 2023-07-03 ENCOUNTER — Other Ambulatory Visit: Payer: Self-pay | Admitting: Cardiovascular Disease

## 2023-07-10 ENCOUNTER — Other Ambulatory Visit: Payer: Self-pay | Admitting: Family Medicine

## 2023-07-10 DIAGNOSIS — M170 Bilateral primary osteoarthritis of knee: Secondary | ICD-10-CM

## 2023-07-10 MED ORDER — TRAMADOL HCL 50 MG PO TABS: ORAL_TABLET | ORAL | 0 refills | Status: DC

## 2023-07-25 ENCOUNTER — Other Ambulatory Visit: Payer: Self-pay | Admitting: Nurse Practitioner

## 2023-07-25 ENCOUNTER — Ambulatory Visit (INDEPENDENT_AMBULATORY_CARE_PROVIDER_SITE_OTHER): Payer: Medicare (Managed Care) | Admitting: Nurse Practitioner

## 2023-07-25 ENCOUNTER — Encounter: Payer: Self-pay | Admitting: Nurse Practitioner

## 2023-07-25 VITALS — BP 118/70 | HR 83 | Temp 97.5°F | Ht 71.0 in | Wt 295.2 lb

## 2023-07-25 DIAGNOSIS — Z1211 Encounter for screening for malignant neoplasm of colon: Secondary | ICD-10-CM

## 2023-07-25 DIAGNOSIS — I10 Essential (primary) hypertension: Secondary | ICD-10-CM | POA: Diagnosis not present

## 2023-07-25 DIAGNOSIS — Z8739 Personal history of other diseases of the musculoskeletal system and connective tissue: Secondary | ICD-10-CM | POA: Diagnosis not present

## 2023-07-25 DIAGNOSIS — E782 Mixed hyperlipidemia: Secondary | ICD-10-CM

## 2023-07-25 DIAGNOSIS — I5032 Chronic diastolic (congestive) heart failure: Secondary | ICD-10-CM

## 2023-07-25 DIAGNOSIS — A084 Viral intestinal infection, unspecified: Secondary | ICD-10-CM | POA: Diagnosis not present

## 2023-07-25 DIAGNOSIS — I4821 Permanent atrial fibrillation: Secondary | ICD-10-CM

## 2023-07-25 DIAGNOSIS — R7303 Prediabetes: Secondary | ICD-10-CM

## 2023-07-25 NOTE — Progress Notes (Signed)
 Bethanie Dicker, NP-C Phone: [phone]  Carlos Crosby is a 68 y.o. male who presents today for transfer of care.   Discussed the use of AI scribe software for clinical note transcription with the patient, who gave verbal consent to proceed.  History of Present Illness   Carlos Crosby is a 68 year old male with atrial fibrillation and hemochromatosis who presents for transfer of care.   He has a history of atrial fibrillation and is currently on Eliquis, digoxin, toprol, and Entresto. He has adjusted his digoxin intake to six days a week to manage toxicity. He monitors his vitals daily using the My Hello Heart app, reporting an average blood pressure of 112/75 mmHg and a heart rate of 68 bpm over the last month. Recently, he noted an increase in heart rate to 102 bpm, which he attributes to dehydration from a recent stomach flu. He usually drinks 100 ounces of water daily but was unable to maintain this due to illness, resulting in dark urine. He has lost weight and maintained it through the holidays.  He has hemochromatosis and undergoes phlebotomies every four to six months to maintain ferritin levels under 100. He describes two types of appointments: one where blood is tested and drained immediately, and another where results are reviewed before deciding on phlebotomy. Both parents had hemochromatosis.  He experienced a stomach flu over the weekend, which he contracted from his wife. He did not experience vomiting but had diarrhea lasting about eight hours. He has not eaten much since Sunday, only consuming crackers yesterday, and reports feeling hungry now. He has not experienced any stomach pain and had a bowel movement that was neither liquid nor solid. He recalls a similar episode three years ago where his wife was more severely affected.  He is on pravastatin for cholesterol management and reports no new muscle aches. His triglycerides increased after discontinuing an experimental drug from a  study, but his overall cholesterol remains under 200 mg/dL. He is considering participation in another study.  He mentions a new issue with Dupuytren's contracture in one finger, which varies in severity. He previously had trigger finger in the same hand, which resolved with brace use. He uses a CBD cream for relief. He has not experienced a gout flare since last treatment, although he occasionally notices slight swelling and twinges, which he manages by increasing water intake.      Social History   Tobacco Use  Smoking Status Never  Smokeless Tobacco Never    Current Outpatient Medications on File Prior to Visit  Medication Sig Dispense Refill   acetaminophen (TYLENOL) 500 MG tablet Take 1,000 mg by mouth every 6 (six) hours as needed.     apixaban (ELIQUIS) 5 MG TABS tablet Take 1 tablet (5 mg total) by mouth 2 (two) times daily. 180 tablet 1   Cholecalciferol (VITAMIN D) 2000 units CAPS daily.      digoxin (LANOXIN) 0.25 MG tablet TAKE 1 TABLET DAILY 90 tablet 3   furosemide (LASIX) 20 MG tablet Take 1 tablet (20 mg total) by mouth daily as needed for edema. 90 tablet 3   loratadine (CLARITIN) 10 MG tablet Take 10 mg by mouth daily.     metoprolol succinate (TOPROL-XL) 100 MG 24 hr tablet Take 1 tablet (100 mg total) by mouth in the morning and at bedtime. 180 tablet 3   Multiple Vitamins-Minerals (CENTRUM SILVER ADULT 50+) TABS Take 1 tablet by mouth daily.     pravastatin (PRAVACHOL) 80  MG tablet TAKE 1 TABLET DAILY 90 tablet 3   sacubitril-valsartan (ENTRESTO) 24-26 MG Take 1 tablet by mouth 2 (two) times daily. 180 tablet 3   traMADol (ULTRAM) 50 MG tablet TAKE 1 TABLET(50 MG) BY MOUTH EVERY 6 HOURS AS NEEDED FOR PAIN 90 tablet 0   predniSONE (DELTASONE) 10 MG tablet TAKE 4 TABLETS PO QD FOR 3 DAYS THEN TAKE 3 TABLETS PO QD FOR 3 DAYS THEN TAKE 2 TABLET PO QD FOR 3 DAYS THEN TAKE 1 TAB PO QD FOR 3 DAYS.  Start at the first sign of a gout flare. (Patient not taking: Reported on  05/11/2023) 30 tablet 0   No current facility-administered medications on file prior to visit.    ROS see history of present illness  Objective  Physical Exam Vitals:   07/25/23 0859  BP: 118/70  Pulse: 83  Temp: (!) 97.5 F (36.4 C)  SpO2: 98%    BP Readings from Last 3 Encounters:  07/25/23 118/70  05/15/23 (!) 108/57  05/11/23 128/86   Wt Readings from Last 3 Encounters:  07/25/23 295 lb 3.2 oz (133.9 kg)  05/11/23 290 lb (131.5 kg)  04/24/23 293 lb 6.4 oz (133.1 kg)    Physical Exam Constitutional:      General: He is not in acute distress.    Appearance: Normal appearance.  HENT:     Head: Normocephalic.  Cardiovascular:     Rate and Rhythm: Normal rate. Rhythm irregularly irregular.     Heart sounds: Normal heart sounds.  Pulmonary:     Effort: Pulmonary effort is normal.     Breath sounds: Normal breath sounds.  Skin:    General: Skin is warm and dry.  Neurological:     General: No focal deficit present.     Mental Status: He is alert.  Psychiatric:        Mood and Affect: Mood normal.        Behavior: Behavior normal.    Assessment/Plan: Please see individual problem list.  Viral gastroenteritis Assessment & Plan: Symptoms of recent gastroenteritis are improving. There is no vomiting, but diarrhea was present. He is now tolerating crackers and feeling hungry. Advise increasing diet as tolerated and maintaining adequate hydration. Return precautions given to patient.    Essential hypertension Assessment & Plan: Condition is stable with an average blood pressure of 112/75. Continue Metoprolol and Entresto as prescribed.    Mixed hyperlipidemia Assessment & Plan: Currently on pravastatin with no new muscle aches. A recent increase in triglycerides is likely due to the discontinuation of an experimental drug. Continue pravastatin 80 mg daily and consider participation in a new study when available.   History of gout Assessment & Plan: No  recent flares. Continue current management.   Hemochromatosis, hereditary (HCC) Assessment & Plan: Managed with phlebotomies every 4-6 months. Continue current management. Follow up with Hematology as scheduled.    Permanent atrial fibrillation (HCC) Assessment & Plan: Condition is stable with an average heart rate of 68. A recent increase in heart rate to 102 is likely due to dehydration from the recent illness. Continue current medications: Eliquis, digoxin, toprol, and Entresto. Encourage hydration, especially during illness. Follow up with Cardiology as scheduled.    Chronic diastolic congestive heart failure (HCC) Assessment & Plan: Euvolemic. Continue Digoxin, Entresto and Laxis as needed. Follow up with Cardiology as scheduled.     Return in about 6 months (around 01/22/2024) for Follow up.   Bethanie Dicker, NP-C [facility] -  [employer]

## 2023-08-01 ENCOUNTER — Encounter: Payer: Self-pay | Admitting: Nurse Practitioner

## 2023-08-01 DIAGNOSIS — A084 Viral intestinal infection, unspecified: Secondary | ICD-10-CM | POA: Insufficient documentation

## 2023-08-01 NOTE — Assessment & Plan Note (Signed)
 Symptoms of recent gastroenteritis are improving. There is no vomiting, but diarrhea was present. He is now tolerating crackers and feeling hungry. Advise increasing diet as tolerated and maintaining adequate hydration. Return precautions given to patient.

## 2023-08-01 NOTE — Assessment & Plan Note (Signed)
 Managed with phlebotomies every 4-6 months. Continue current management. Follow up with Hematology as scheduled.

## 2023-08-01 NOTE — Assessment & Plan Note (Signed)
 Euvolemic. Continue Digoxin, Entresto and Laxis as needed. Follow up with Cardiology as scheduled.

## 2023-08-01 NOTE — Assessment & Plan Note (Signed)
 Currently on pravastatin with no new muscle aches. A recent increase in triglycerides is likely due to the discontinuation of an experimental drug. Continue pravastatin 80 mg daily and consider participation in a new study when available.

## 2023-08-01 NOTE — Assessment & Plan Note (Signed)
 No recent flares. Continue current management.

## 2023-08-01 NOTE — Assessment & Plan Note (Signed)
 Condition is stable with an average blood pressure of 112/75. Continue Metoprolol and Entresto as prescribed.

## 2023-08-01 NOTE — Assessment & Plan Note (Addendum)
 Condition is stable with an average heart rate of 68. A recent increase in heart rate to 102 is likely due to dehydration from the recent illness. Continue current medications: Eliquis, digoxin, toprol, and Entresto. Encourage hydration, especially during illness. Follow up with Cardiology as scheduled.

## 2023-08-14 ENCOUNTER — Ambulatory Visit (INDEPENDENT_AMBULATORY_CARE_PROVIDER_SITE_OTHER): Payer: Medicare (Managed Care) | Admitting: *Deleted

## 2023-08-14 ENCOUNTER — Ambulatory Visit: Payer: Medicare (Managed Care)

## 2023-08-14 VITALS — BP 102/63 | Ht 71.0 in | Wt 285.0 lb

## 2023-08-14 DIAGNOSIS — Z0000 Encounter for general adult medical examination without abnormal findings: Secondary | ICD-10-CM

## 2023-08-14 DIAGNOSIS — H9313 Tinnitus, bilateral: Secondary | ICD-10-CM | POA: Diagnosis not present

## 2023-08-14 DIAGNOSIS — H6123 Impacted cerumen, bilateral: Secondary | ICD-10-CM | POA: Diagnosis not present

## 2023-08-14 DIAGNOSIS — H903 Sensorineural hearing loss, bilateral: Secondary | ICD-10-CM | POA: Diagnosis not present

## 2023-08-14 NOTE — Progress Notes (Signed)
 Subjective:   Carlos Crosby is a 68 y.o. who presents for a Medicare Wellness preventive visit.  Visit Complete: Virtual I connected with  Carlos Crosby on 08/14/23 by a audio enabled telemedicine application and verified that I am speaking with the correct person using two identifiers.  Patient Location: Home  Provider Location: Office/Clinic  I discussed the limitations of evaluation and management by telemedicine. The patient expressed understanding and agreed to proceed.  Vital Signs: Because this visit was a virtual/telehealth visit, some criteria may be missing or patient reported. Any vitals not documented were not able to be obtained and vitals that have been documented are patient reported.  VideoDeclined- This patient declined Librarian, academic. Therefore the visit was completed with audio only.  AWV Questionnaire: Yes: Patient Medicare AWV questionnaire was completed by the patient on 08/14/23; I have confirmed that all information answered by patient is correct and no changes since this date.  Cardiac Risk Factors include: advanced age (>74men, >41 women);male gender;dyslipidemia;hypertension;obesity (BMI >30kg/m2)     Objective:    Today's Vitals   08/14/23 1342 08/14/23 1343  BP: 102/63   Weight: 285 lb (129.3 kg)   Height: 5\' 11"  (1.803 m)   PainSc:  3    Body mass index is 39.75 kg/m.     08/14/2023    1:54 PM 01/12/2023    1:03 PM 08/11/2022    8:49 AM 05/12/2022    2:06 PM 08/12/2021    2:01 PM 08/09/2021    8:30 AM 02/11/2021    1:32 PM  Advanced Directives  Does Patient Have a Medical Advance Directive? Yes Yes Yes Yes No Yes No  Type of Estate agent of [place];Living will  Healthcare Power of [place];Living will   Living will   Does patient want to make changes to medical advance directive?   No - Patient declined  No - Patient declined No - Patient declined   Copy of Healthcare Power of Attorney in Chart?  No - copy requested  No - copy requested   No - copy requested   Would patient like information on creating a medical advance directive?       No - Patient declined    Current Medications (verified) Outpatient Encounter Medications as of 08/14/2023  Medication Sig   acetaminophen (TYLENOL) 500 MG tablet Take 1,000 mg by mouth every 6 (six) hours as needed.   apixaban (ELIQUIS) 5 MG TABS tablet Take 1 tablet (5 mg total) by mouth 2 (two) times daily.   Cholecalciferol (VITAMIN D) 2000 units CAPS daily.    digoxin (LANOXIN) 0.25 MG tablet TAKE 1 TABLET DAILY   furosemide (LASIX) 20 MG tablet Take 1 tablet (20 mg total) by mouth daily as needed for edema.   loratadine (CLARITIN) 10 MG tablet Take 10 mg by mouth daily.   metoprolol succinate (TOPROL-XL) 100 MG 24 hr tablet Take 1 tablet (100 mg total) by mouth in the morning and at bedtime.   Multiple Vitamins-Minerals (CENTRUM SILVER ADULT 50+) TABS Take 1 tablet by mouth daily.   pravastatin (PRAVACHOL) 80 MG tablet TAKE 1 TABLET DAILY   sacubitril-valsartan (ENTRESTO) 24-26 MG Take 1 tablet by mouth 2 (two) times daily.   traMADol (ULTRAM) 50 MG tablet TAKE 1 TABLET(50 MG) BY MOUTH EVERY 6 HOURS AS NEEDED FOR PAIN   [DISCONTINUED] predniSONE (DELTASONE) 10 MG tablet TAKE 4 TABLETS PO QD FOR 3 DAYS THEN TAKE 3 TABLETS PO QD FOR 3  DAYS THEN TAKE 2 TABLET PO QD FOR 3 DAYS THEN TAKE 1 TAB PO QD FOR 3 DAYS.  Start at the first sign of a gout flare. (Patient not taking: Reported on 08/14/2023)   No facility-administered encounter medications on file as of 08/14/2023.    Allergies (verified) Allopurinol, Diltiazem, Chlorhexidine, Peridex [chlorhexidine gluconate], Ondansetron, and Penicillins   History: Past Medical History:  Diagnosis Date   Chronic a-fib (HCC)    Chronic systolic CHF (congestive heart failure) (HCC)    Depression    Edema 09/26/2014   Hemochromatosis    History of kidney stones    History of pulmonary embolism     Hyperlipidemia    Hypertension    Migraine    Obesity    OSA (obstructive sleep apnea)    on CPAP   Osteoarthritis    bilateral knees   [place] spotted fever 2011   Past Surgical History:  Procedure Laterality Date   CARDIAC CATHETERIZATION  2012   [facility]   KNEE SURGERY     right knee    LITHOTRIPSY  07/31/2014   URETERAL STENT PLACEMENT  2016   URETEROSCOPY     Family History  Problem Relation Age of Onset   Thyroid disease Mother    Cancer Father        lung   Arthritis Father    Hypertension Father    Cancer Paternal Grandfather        Throat cancer   Social History   Socioeconomic History   Marital status: Married    Spouse name: Not on file   Number of children: Not on file   Years of education: Not on file   Highest education level: Bachelor's degree (e.g., BA, AB, BS)  Occupational History   Not on file  Tobacco Use   Smoking status: Never   Smokeless tobacco: Never  Vaping Use   Vaping status: Never Used  Substance and Sexual Activity   Alcohol use: No    Comment: occasional; 3 times a year   Drug use: No   Sexual activity: Not Currently  Other Topics Concern   Not on file  Social History Narrative   Married   Social Drivers of Health   Financial Resource Strain: Low Risk  (08/14/2023)   Overall Financial Resource Strain (CARDIA)    Difficulty of Paying Living Expenses: Not very hard  Food Insecurity: No Food Insecurity (08/14/2023)   Hunger Vital Sign    Worried About Running Out of Food in the Last Year: Never true    Ran Out of Food in the Last Year: Never true  Transportation Needs: No Transportation Needs (08/14/2023)   PRAPARE - Administrator, Civil Service (Medical): No    Lack of Transportation (Non-Medical): No  Physical Activity: Sufficiently Active (08/14/2023)   Exercise Vital Sign    Days of Exercise per Week: 4 days    Minutes of Exercise per Session: 50 min  Stress: No Stress Concern Present (08/14/2023)    [employer] of Occupational Health - Occupational Stress Questionnaire    Feeling of Stress : Not at all  Social Connections: Moderately Integrated (08/14/2023)   Social Connection and Isolation Panel [NHANES]    Frequency of Communication with Friends and Family: Three times a week    Frequency of Social Gatherings with Friends and Family: Three times a week    Attends Religious Services: Never    Active Member of Clubs or Organizations: Yes  Attends Engineer, structural: More than 4 times per year    Marital Status: Married    Tobacco Counseling Counseling given: Not Answered    Clinical Intake:  Pre-visit preparation completed: Yes  Pain : 0-10 Pain Score: 3  Pain Type: Chronic pain Pain Location: Knee Pain Orientation: Right, Left Pain Descriptors / Indicators: Dull, Nagging Pain Onset: More than a month ago Pain Frequency: Constant     BMI - recorded: 39.75 Nutritional Status: BMI > 30  Obese Nutritional Risks: None Diabetes: No  How often do you need to have someone help you when you read instructions, pamphlets, or other written materials from your doctor or pharmacy?: 1 - Never  Interpreter Needed?: No  Information entered by :: R. Hughie Melroy LPN   Activities of Daily Living     08/14/2023    1:45 PM 08/07/2023    8:36 AM  In your present state of health, do you have any difficulty performing the following activities:  Hearing? 1 0  Vision? 0 0  Difficulty concentrating or making decisions? 0 0  Walking or climbing stairs? 1 1  Dressing or bathing? 0 0  Doing errands, shopping? 0 0  Preparing Food and eating ? N N  Using the Toilet? N N  In the past six months, have you accidently leaked urine? N N  Do you have problems with loss of bowel control? N N  Managing your Medications? N N  Managing your Finances? N N  Housekeeping or managing your Housekeeping? N N    Patient Care Team: Bethanie Dicker, NP as PCP - General (Nurse  Practitioner) Antonieta Iba, MD as PCP - Cardiology (Cardiology) Duke Salvia, MD as PCP - Electrophysiology (Cardiology) Antonieta Iba, MD as Consulting Physician (Cardiology) Jeralyn Ruths, MD as Consulting Physician (Oncology)  Indicate any recent Medical Services you may have received from other than [facility] providers in the past year (date may be approximate).     Assessment:   This is a routine wellness examination for Carlos Crosby.  Hearing/Vision screen Hearing Screening - Comments:: Hearing issues, needs hearing aids Vision Screening - Comments:: readers   Goals Addressed             This Visit's Progress    Patient Stated       Wants to continue to lose weight       Depression Screen     08/14/2023    1:50 PM 07/25/2023    9:02 AM 04/24/2023    8:13 AM 10/17/2022   11:52 AM 08/11/2022    8:57 AM 07/08/2022    9:02 AM 04/01/2022    2:36 PM  PHQ 2/9 Scores  PHQ - 2 Score 0 0 0 0 0 0 0  PHQ- 9 Score 0 0 0 0       Fall Risk     08/14/2023    1:47 PM 08/07/2023    8:36 AM 07/25/2023    9:02 AM 04/24/2023    8:13 AM 10/17/2022   11:52 AM  Fall Risk   Falls in the past year? 0 0 0 0 0  Number falls in past yr: 0  0 0 0  Injury with Fall? 0 0 0 0 0  Risk for fall due to : No Fall Risks  No Fall Risks No Fall Risks No Fall Risks  Follow up Falls prevention discussed;Falls evaluation completed   Falls evaluation completed Falls evaluation completed    MEDICARE RISK AT HOME:  Medicare Risk at Home Any stairs in or around the home?: Yes If so, are there any without handrails?: Yes Home free of loose throw rugs in walkways, pet beds, electrical cords, etc?: No Adequate lighting in your home to reduce risk of falls?: Yes Life alert?: No Use of a cane, walker or w/c?: Yes Grab bars in the bathroom?: No Shower chair or bench in shower?: No Elevated toilet seat or a handicapped toilet?: Yes  TIMED UP AND GO:  Was the test performed?  No  Cognitive  Function: 6CIT completed    07/24/2018    8:24 AM 07/21/2017    8:59 AM 07/20/2016    8:49 AM  MMSE - Mini Mental State Exam  Orientation to time 5 5 5   Orientation to Place 5 5 5   Registration 3 3 3   Attention/ Calculation 5 5 5   Recall 3 3 2   Recall-comments   2 out of 3 words recalled  Language- name 2 objects 2 2 2   Language- repeat 1 1 1   Language- follow 3 step command 3 3 3   Language- read & follow direction 1 1 1   Write a sentence 1 1 1   Copy design 1 1 1   Total score 30 30 29         08/14/2023    1:55 PM 08/11/2022    8:58 AM  6CIT Screen  What Year? 0 points 0 points  What month? 0 points 0 points  What time? 0 points 0 points  Count back from 20 0 points 0 points  Months in reverse 0 points 0 points  Repeat phrase 0 points 0 points  Total Score 0 points 0 points    Immunizations Immunization History  Administered Date(s) Administered   Fluad Quad(high Dose 65+) 03/17/2022   Fluad Trivalent(High Dose 65+) 04/24/2023   Influenza,inj,Quad PF,6+ Mos 03/20/2013, 02/27/2015, 01/26/2017, 02/19/2018, 02/05/2019, 02/25/2020, 02/19/2021   Influenza-Unspecified 03/08/2014, 02/26/2016, 02/17/2018, 02/19/2018   Moderna SARS-COV2 Booster Vaccination 09/01/2020, 02/19/2021, 03/17/2022   Moderna Sars-Covid-2 Vaccination 08/03/2019, 08/31/2019, 03/30/2020   PNEUMOCOCCAL CONJUGATE-20 10/19/2021   Pfizer(Comirnaty)Fall Seasonal Vaccine 12 years and older 03/17/2022   Pneumococcal Polysaccharide-23 05/06/2015   Respiratory Syncytial Virus Vaccine,Recomb Aduvanted(Arexvy) 02/17/2022   Tdap 02/17/2022   Vaccinia,smallpox Monkeypox Vaccine Live,pf 02/17/2022   Zoster Recombinant(Shingrix) 02/17/2022    Screening Tests Health Maintenance  Topic Date Due   Zoster Vaccines- Shingrix (2 of 2) 04/14/2022   COVID-19 Vaccine (4 - 2024-25 season) 02/05/2023   Fecal DNA (Cologuard)  08/21/2023   Medicare Annual Wellness (AWV)  08/13/2024   DTaP/Tdap/Td (2 - Td or Tdap) 02/18/2032    Pneumonia Vaccine 41+ Years old  Completed   INFLUENZA VACCINE  Completed   Hepatitis C Screening  Completed   HPV VACCINES  Aged Out    Health Maintenance  Health Maintenance Due  Topic Date Due   Zoster Vaccines- Shingrix (2 of 2) 04/14/2022   COVID-19 Vaccine (4 - 2024-25 season) 02/05/2023   Health Maintenance Items Addressed: Discussed with patient the need to complete his shingles vaccines.  Patient stated that he is waiting on his cologuard test kit to be sent out to him.  Additional Screening:  Vision Screening: Recommended annual ophthalmology exams for early detection of glaucoma and other disorders of the eye. Patient is not up to date but will call [facility] and schedule an appointment.  Dental Screening: Recommended annual dental exams for proper oral hygiene  Community Resource Referral / Chronic Care Management: CRR required this visit?  No  CCM required this visit?  No     Plan:     I have personally reviewed and noted the following in the patient's chart:   Medical and social history Use of alcohol, tobacco or illicit drugs  Current medications and supplements including opioid prescriptions. Patient is currently taking opioid prescriptions. Information provided to patient regarding non-opioid alternatives. Patient advised to discuss non-opioid treatment plan with their provider. Functional ability and status Nutritional status Physical activity Advanced directives List of other physicians Hospitalizations, surgeries, and ER visits in previous 12 months Vitals Screenings to include cognitive, depression, and falls Referrals and appointments  In addition, I have reviewed and discussed with patient certain preventive protocols, quality metrics, and best practice recommendations. A written personalized care plan for preventive services as well as general preventive health recommendations were provided to patient.     Sydell Axon, LPN   2/84/1324    After Visit Summary: (MyChart) Due to this being a telephonic visit, the after visit summary with patients personalized plan was offered to patient via MyChart   Notes: Nothing significant to report at this time.

## 2023-08-14 NOTE — Patient Instructions (Signed)
 [name] , Thank you for taking time to come for your Medicare Wellness Visit. I appreciate your ongoing commitment to your health goals. Please review the following plan we discussed and let me know if I can assist you in the future.   Referrals/Orders/Follow-Ups/Clinician Recommendations: Remember to get your second shingles vaccine. Call and schedule an eye exam.  This is a list of the screening recommended for you and due dates:  Health Maintenance  Topic Date Due   Zoster (Shingles) Vaccine (2 of 2) 04/14/2022   COVID-19 Vaccine (4 - 2024-25 season) 02/05/2023   Cologuard (Stool DNA test)  08/21/2023   Medicare Annual Wellness Visit  08/13/2024   DTaP/Tdap/Td vaccine (2 - Td or Tdap) 02/18/2032   Pneumonia Vaccine  Completed   Flu Shot  Completed   Hepatitis C Screening  Completed   HPV Vaccine  Aged Out    Advanced directives: (Copy Requested) Please bring a copy of your health care power of attorney and living will to the office to be added to your chart at your convenience. You can mail to [facility] [address] [place], [place] [address] or email to [email or web address]  Next Medicare Annual Wellness Visit scheduled for next year: Yes  316/26 @ 8:50    Managing Pain Without Opioids Opioids are strong medicines used to treat moderate to severe pain. For some people, especially those who have long-term (chronic) pain, opioids may not be the best choice for pain management due to: Side effects like nausea, constipation, and sleepiness. The risk of addiction (opioid use disorder). The longer you take opioids, the greater your risk of addiction. Pain that lasts for more than 3 months is called chronic pain. Managing chronic pain usually requires more than one approach and is often provided by a team of health care providers working together (multidisciplinary approach). Pain management may be done at a pain management center or pain clinic. How to  manage pain without the use of opioids Use non-opioid medicines Non-opioid medicines for pain may include: Over-the-counter or prescription non-steroidal anti-inflammatory drugs (NSAIDs). These may be the first medicines used for pain. They work well for muscle and bone pain, and they reduce swelling. Acetaminophen. This over-the-counter medicine may work well for milder pain but not swelling. Antidepressants. These may be used to treat chronic pain. A certain type of antidepressant (tricyclics) is often used. These medicines are given in lower doses for pain than when used for depression. Anticonvulsants. These are usually used to treat seizures but may also reduce nerve (neuropathic) pain. Muscle relaxants. These relieve pain caused by sudden muscle tightening (spasms). You may also use a pain medicine that is applied to the skin as a patch, cream, or gel (topical analgesic), such as a numbing medicine. These may cause fewer side effects than medicines taken by mouth. Do certain therapies as directed Some therapies can help with pain management. They include: Physical therapy. You will do exercises to gain strength and flexibility. A physical therapist may teach you exercises to move and stretch parts of your body that are weak, stiff, or painful. You can learn these exercises at physical therapy visits and practice them at home. Physical therapy may also involve: Massage. Heat wraps or applying heat or cold to affected areas. Electrical signals that interrupt pain signals (transcutaneous electrical nerve stimulation, TENS). Weak lasers that reduce pain and swelling (low-level laser therapy). Signals from your body that help you learn to regulate pain (biofeedback). Occupational therapy. This  helps you to learn ways to function at home and work with less pain. Recreational therapy. This involves trying new activities or hobbies, such as a physical activity or drawing. Mental health therapy,  including: Cognitive behavioral therapy (CBT). This helps you learn coping skills for dealing with pain. Acceptance and commitment therapy (ACT) to change the way you think and react to pain. Relaxation therapies, including muscle relaxation exercises and mindfulness-based stress reduction. Pain management counseling. This may be individual, family, or group counseling.  Receive medical treatments Medical treatments for pain management include: Nerve block injections. These may include a pain blocker and anti-inflammatory medicines. You may have injections: Near the spine to relieve chronic back or neck pain. Into joints to relieve back or joint pain. Into nerve areas that supply a painful area to relieve body pain. Into muscles (trigger point injections) to relieve some painful muscle conditions. A medical device placed near your spine to help block pain signals and relieve nerve pain or chronic back pain (spinal cord stimulation device). Acupuncture. Follow these instructions at home Medicines Take over-the-counter and prescription medicines only as told by your health care provider. If you are taking pain medicine, ask your health care providers about possible side effects to watch out for. Do not drive or use heavy machinery while taking prescription opioid pain medicine. Lifestyle  Do not use drugs or alcohol to reduce pain. If you drink alcohol, limit how much you have to: 0-1 drink a day for women who are not pregnant. 0-2 drinks a day for men. Know how much alcohol is in a drink. In the [place] one drink equals one 12 oz bottle of beer (355 mL), one 5 oz glass of wine (148 mL), or one 1 oz glass of hard liquor (44 mL). Do not use any products that contain nicotine or tobacco. These products include cigarettes, chewing tobacco, and vaping devices, such as e-cigarettes. If you need help quitting, ask your health care provider. Eat a healthy diet and maintain a healthy weight. Poor  diet and excess weight may make pain worse. Eat foods that are high in fiber. These include fresh fruits and vegetables, whole grains, and beans. Limit foods that are high in fat and processed sugars, such as fried and sweet foods. Exercise regularly. Exercise lowers stress and may help relieve pain. Ask your health care provider what activities and exercises are safe for you. If your health care provider approves, join an exercise class that combines movement and stress reduction. Examples include yoga and tai chi. Get enough sleep. Lack of sleep may make pain worse. Lower stress as much as possible. Practice stress reduction techniques as told by your therapist. General instructions Work with all your pain management providers to find the treatments that work best for you. You are an important member of your pain management team. There are many things you can do to reduce pain on your own. Consider joining an online or in-person support group for people who have chronic pain. Keep all follow-up visits. This is important. Where to find more information You can find more information about managing pain without opioids from: American Academy of Pain Medicine: painmed.org Institute for Chronic Pain: instituteforchronicpain.org American Chronic Pain Association: theacpa.org Contact a health care provider if: You have side effects from pain medicine. Your pain gets worse or does not get better with treatments or home therapy. You are struggling with anxiety or depression. Summary Many types of pain can be managed without opioids. Chronic pain may  respond better to pain management without opioids. Pain is best managed when you and a team of health care providers work together. Pain management without opioids may include non-opioid medicines, medical treatments, physical therapy, mental health therapy, and lifestyle changes. Tell your health care providers if your pain gets worse or is not being  managed well enough. This information is not intended to replace advice given to you by your health care provider. Make sure you discuss any questions you have with your health care provider. Document Revised: 09/02/2020 Document Reviewed: 09/02/2020 Elsevier Patient Education  2024 [employer].

## 2023-08-19 ENCOUNTER — Encounter: Payer: Self-pay | Admitting: Nurse Practitioner

## 2023-08-19 DIAGNOSIS — M170 Bilateral primary osteoarthritis of knee: Secondary | ICD-10-CM

## 2023-08-21 MED ORDER — TRAMADOL HCL 50 MG PO TABS: ORAL_TABLET | ORAL | 2 refills | Status: DC

## 2023-08-24 DIAGNOSIS — G4733 Obstructive sleep apnea (adult) (pediatric): Secondary | ICD-10-CM | POA: Diagnosis not present

## 2023-08-31 DIAGNOSIS — Z1211 Encounter for screening for malignant neoplasm of colon: Secondary | ICD-10-CM | POA: Diagnosis not present

## 2023-09-05 LAB — COLOGUARD: COLOGUARD: NEGATIVE

## 2023-09-06 ENCOUNTER — Encounter: Payer: Self-pay | Admitting: Nurse Practitioner

## 2023-09-11 ENCOUNTER — Inpatient Hospital Stay: Payer: Medicare (Managed Care) | Attending: Oncology

## 2023-09-11 DIAGNOSIS — D751 Secondary polycythemia: Secondary | ICD-10-CM | POA: Diagnosis not present

## 2023-09-11 DIAGNOSIS — Z7901 Long term (current) use of anticoagulants: Secondary | ICD-10-CM | POA: Diagnosis not present

## 2023-09-11 DIAGNOSIS — Z86711 Personal history of pulmonary embolism: Secondary | ICD-10-CM | POA: Diagnosis not present

## 2023-09-11 DIAGNOSIS — Z79899 Other long term (current) drug therapy: Secondary | ICD-10-CM | POA: Diagnosis not present

## 2023-09-11 LAB — CBC WITH DIFFERENTIAL/PLATELET
Abs Immature Granulocytes: 0.05 10*3/uL (ref 0.00–0.07)
Basophils Absolute: 0.1 10*3/uL (ref 0.0–0.1)
Basophils Relative: 1 %
Eosinophils Absolute: 0.1 10*3/uL (ref 0.0–0.5)
Eosinophils Relative: 2 %
HCT: 53.7 % — ABNORMAL HIGH (ref 39.0–52.0)
Hemoglobin: 18.1 g/dL — ABNORMAL HIGH (ref 13.0–17.0)
Immature Granulocytes: 1 %
Lymphocytes Relative: 34 %
Lymphs Abs: 2.5 10*3/uL (ref 0.7–4.0)
MCH: 32.3 pg (ref 26.0–34.0)
MCHC: 33.7 g/dL (ref 30.0–36.0)
MCV: 95.9 fL (ref 80.0–100.0)
Monocytes Absolute: 0.6 10*3/uL (ref 0.1–1.0)
Monocytes Relative: 8 %
Neutro Abs: 4 10*3/uL (ref 1.7–7.7)
Neutrophils Relative %: 54 %
Platelets: 199 10*3/uL (ref 150–400)
RBC: 5.6 MIL/uL (ref 4.22–5.81)
RDW: 13.1 % (ref 11.5–15.5)
WBC: 7.4 10*3/uL (ref 4.0–10.5)
nRBC: 0 % (ref 0.0–0.2)

## 2023-09-11 LAB — FERRITIN: Ferritin: 50 ng/mL (ref 24–336)

## 2023-09-12 ENCOUNTER — Inpatient Hospital Stay: Payer: Medicare (Managed Care) | Admitting: Oncology

## 2023-09-12 ENCOUNTER — Encounter: Payer: Self-pay | Admitting: Internal Medicine

## 2023-09-12 NOTE — Progress Notes (Unsigned)
 [facility]  Telephone:(336) [phone] Fax:(336) [phone]  ID: Carlos Crosby OB: [birth year]  MR#: [id number]  HKV#:[id number]  Patient Care Team: Bethanie Dicker, NP as PCP - General (Nurse Practitioner) Antonieta Iba, MD as PCP - Cardiology (Cardiology) Duke Salvia, MD as PCP - Electrophysiology (Cardiology) Antonieta Iba, MD as Consulting Physician (Cardiology) Jeralyn Ruths, MD as Consulting Physician (Oncology)  I connected with Carlos Crosby on 09/14/23 at  3:30 PM EDT by video enabled telemedicine visit and verified that I am speaking with the correct person using two identifiers.   I discussed the limitations, risks, security and privacy concerns of performing an evaluation and management service by telemedicine and the availability of in-person appointments. I also discussed with the patient that there may be a patient responsible charge related to this service. The patient expressed understanding and agreed to proceed.   Other persons participating in the visit and their role in the encounter: Patient, MD.  Patient's location: Home. Provider's location: Clinic.  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  INTERVAL HISTORY: Patient agreed to video-assisted telemedicine visit for further evaluation, discussion of his laboratory results, and consideration of additional phlebotomy.  He continues to feel well and remains asymptomatic.  He reports a nearly 70 pound intentional weight loss.  He has no neurologic complaints.  He denies any recent fevers or illnesses.  He denies any chest pain, shortness of breath, cough, or hemoptysis.  He denies any nausea, vomiting, constipation, or diarrhea.  He has no urinary complaints.  Patient offers no specific complaints today.  REVIEW OF SYSTEMS:   Review of Systems  Constitutional: Negative.  Negative for fever, malaise/fatigue and weight loss.  Respiratory: Negative.   Negative for cough, hemoptysis and shortness of breath.   Cardiovascular: Negative.  Negative for chest pain and leg swelling.  Gastrointestinal: Negative.  Negative for abdominal pain, blood in stool and melena.  Genitourinary: Negative.  Negative for hematuria.  Musculoskeletal: Negative.  Negative for back pain.  Skin: Negative.  Negative for rash.  Neurological: Negative.  Negative for sensory change, focal weakness, weakness and headaches.  Psychiatric/Behavioral: Negative.  The patient is not nervous/anxious.     As per HPI. Otherwise, a complete review of systems is negative.  PAST MEDICAL HISTORY: Past Medical History:  Diagnosis Date   Chronic a-fib (HCC)    Chronic systolic CHF (congestive heart failure) (HCC)    Depression    Edema 09/26/2014   Hemochromatosis    History of kidney stones    History of pulmonary embolism    Hyperlipidemia    Hypertension    Migraine    Obesity    OSA (obstructive sleep apnea)    on CPAP   Osteoarthritis    bilateral knees   [facility] spotted fever 2011    PAST SURGICAL HISTORY: Past Surgical History:  Procedure Laterality Date   CARDIAC CATHETERIZATION  2012   [facility]   KNEE SURGERY     right knee    LITHOTRIPSY  07/31/2014   URETERAL STENT PLACEMENT  2016   URETEROSCOPY      FAMILY HISTORY: Family History  Problem Relation Age of Onset   Thyroid disease Mother    Cancer Father        lung   Arthritis Father    Hypertension Father    Cancer Paternal Grandfather        Throat cancer    ADVANCED DIRECTIVES (Y/N):  N  HEALTH MAINTENANCE: Social History   Tobacco Use   Smoking status: Never   Smokeless tobacco: Never  Vaping Use   Vaping status: Never Used  Substance Use Topics   Alcohol use: No    Comment: occasional; 3 times a year   Drug use: No     Colonoscopy:  PAP:  Bone density:  Lipid panel:  Allergies  Allergen Reactions   Allopurinol Hives and Swelling   Diltiazem Itching and Rash    Chlorhexidine      lump in throat    Peridex [Chlorhexidine Gluconate]     Mouth swelling  Blisters    Ondansetron Rash   Penicillins Other (See Comments) and Nausea Only    Pt unsure of rxn; showed up on allergy test Pt unsure of rxn; showed up on allergy test Pt unsure of rxn; showed up on allergy test And dirrhea     Current Outpatient Medications  Medication Sig Dispense Refill   acetaminophen (TYLENOL) 500 MG tablet Take 1,000 mg by mouth every 6 (six) hours as needed.     apixaban (ELIQUIS) 5 MG TABS tablet Take 1 tablet (5 mg total) by mouth 2 (two) times daily. 180 tablet 1   Cholecalciferol (VITAMIN D) 2000 units CAPS daily.      digoxin (LANOXIN) 0.25 MG tablet TAKE 1 TABLET DAILY 90 tablet 3   furosemide (LASIX) 20 MG tablet Take 1 tablet (20 mg total) by mouth daily as needed for edema. 90 tablet 3   loratadine (CLARITIN) 10 MG tablet Take 10 mg by mouth daily.     metoprolol succinate (TOPROL-XL) 100 MG 24 hr tablet Take 1 tablet (100 mg total) by mouth in the morning and at bedtime. 180 tablet 3   Multiple Vitamins-Minerals (CENTRUM SILVER ADULT 50+) TABS Take 1 tablet by mouth daily.     pravastatin (PRAVACHOL) 80 MG tablet TAKE 1 TABLET DAILY 90 tablet 3   sacubitril-valsartan (ENTRESTO) 24-26 MG Take 1 tablet by mouth 2 (two) times daily. 180 tablet 3   traMADol (ULTRAM) 50 MG tablet TAKE 1 TABLET(50 MG) BY MOUTH EVERY 6 HOURS AS NEEDED FOR PAIN 90 tablet 2   No current facility-administered medications for this visit.    OBJECTIVE: There were no vitals filed for this visit.    There is no height or weight on file to calculate BMI.    ECOG FS:0 - Asymptomatic  General: Well-developed, well-nourished, no acute distress. HEENT: Normocephalic. Neuro: Alert, answering all questions appropriately. Cranial nerves grossly intact. Psych: Normal affect.  LAB RESULTS:  Lab Results  Component Value Date   NA 136 05/15/2023   K 4.3 05/15/2023   CL 98 05/15/2023    CO2 26 05/15/2023   GLUCOSE 101 (H) 05/15/2023   BUN 27 (H) 05/15/2023   CREATININE 0.99 05/15/2023   CALCIUM 9.1 05/15/2023   PROT 7.1 05/15/2023   ALBUMIN 4.1 05/15/2023   AST 19 05/15/2023   ALT 18 05/15/2023   ALKPHOS 42 05/15/2023   BILITOT 1.0 05/15/2023   GFRNONAA >60 05/15/2023   GFRAA 107 09/05/2019    Lab Results  Component Value Date   WBC 7.4 09/11/2023   NEUTROABS 4.0 09/11/2023   HGB 18.1 (H) 09/11/2023   HCT 53.7 (H) 09/11/2023   MCV 95.9 09/11/2023   PLT 199 09/11/2023   Lab Results  Component Value Date   IRON 158 08/10/2021   TIBC 365 08/10/2021   IRONPCTSAT 43 (H) 08/10/2021   Lab Results  Component Value Date  FERRITIN 50 09/11/2023     STUDIES: No results found.  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  Patient's ferritin level was 50 today.  Patient has a persistently elevated hemoglobin, but the remainder of her laboratory work is either negative or within normal limits.  It appears patient only requires phlebotomy every 4 to 6 months.  No treatment is necessary today.  Return to clinic in 4 months with repeat laboratory work, video-assisted telemedicine visit, and consideration of phlebotomy.  Of note, because patient is on Eliquis he cannot receive his phlebotomy through [facility]. Polycythemia: Upon review of patient's chart, patient has had a persistently elevated hemoglobin despite the level of his iron stores since at least February 2021.  His hemoglobin has ranged between 17.0 and 18.2 over that timeframe.  Today's result is 18.1.    I provided 20 minutes of face-to-face video visit time during this encounter which included chart review, counseling, and coordination of care as documented above.  Patient expressed understanding and was in agreement with this plan. He also understands that He can  call clinic at any time with any questions, concerns, or complaints.    Jeralyn Ruths, MD   09/12/2023 3:37 PM

## 2023-09-13 ENCOUNTER — Encounter: Payer: Self-pay | Admitting: Oncology

## 2023-09-13 ENCOUNTER — Other Ambulatory Visit (HOSPITAL_COMMUNITY): Payer: Self-pay

## 2023-09-13 ENCOUNTER — Telehealth: Payer: Self-pay

## 2023-09-13 ENCOUNTER — Inpatient Hospital Stay: Payer: Medicare (Managed Care)

## 2023-09-13 ENCOUNTER — Other Ambulatory Visit: Payer: Self-pay

## 2023-09-13 MED ORDER — ENTRESTO 24-26 MG PO TABS: 1.0000 | ORAL_TABLET | Freq: Two times a day (BID) | ORAL | 3 refills | Status: DC

## 2023-09-13 NOTE — Telephone Encounter (Signed)
 Please send in prescription for Entresto to  [id number] - [place], [place] - [address] AT [facility] OF [address] [facility] [address], [place] [address]

## 2023-09-13 NOTE — Telephone Encounter (Signed)
 See separate encounter. Patient approved for a grant.

## 2023-09-13 NOTE — Telephone Encounter (Signed)
 Patient Advocate Encounter   The patient was approved for a [employer] grant that will help cover the cost of ENTRESTO Total amount awarded, $10,000.  Effective: 08/14/23 - 08/12/24   [id number] [id number]   Pharmacy provided with approval and processing information. Patient informed via Dorcas Carrow, CPhT  Pharmacy Patient Advocate Specialist  Direct Number: [phone] Fax: [phone]

## 2023-09-13 NOTE — Telephone Encounter (Signed)
 RX sent to pharmacy below.   Thanks!

## 2023-09-13 NOTE — Telephone Encounter (Signed)
Prescription sent to [facility] in [place].

## 2023-09-14 ENCOUNTER — Encounter: Payer: Self-pay | Admitting: Oncology

## 2023-09-26 ENCOUNTER — Encounter: Payer: Self-pay | Admitting: Oncology

## 2023-09-27 ENCOUNTER — Encounter: Payer: Self-pay | Admitting: Nurse Practitioner

## 2023-09-28 ENCOUNTER — Ambulatory Visit: Payer: Medicare (Managed Care) | Attending: Internal Medicine | Admitting: Internal Medicine

## 2023-09-28 VITALS — BP 103/63 | HR 81 | Ht 71.0 in | Wt 308.0 lb

## 2023-09-28 DIAGNOSIS — I428 Other cardiomyopathies: Secondary | ICD-10-CM

## 2023-09-28 DIAGNOSIS — I5032 Chronic diastolic (congestive) heart failure: Secondary | ICD-10-CM

## 2023-09-28 DIAGNOSIS — I4821 Permanent atrial fibrillation: Secondary | ICD-10-CM | POA: Diagnosis not present

## 2023-09-28 DIAGNOSIS — I10 Essential (primary) hypertension: Secondary | ICD-10-CM | POA: Diagnosis not present

## 2023-09-28 NOTE — Patient Instructions (Signed)
 Medication Instructions:  The current medical regimen is effective;  continue present plan and medications.  *If you need a refill on your cardiac medications before your next appointment, please call your pharmacy*  Lab Work: Your provider would like for you to have following labs drawn today DIG, JAK2.   If you have labs (blood work) drawn today and your tests are completely normal, you will receive your results only by: MyChart Message (if you have MyChart) OR A paper copy in the mail If you have any lab test that is abnormal or we need to change your treatment, we will call you to review the results.   Follow-Up: At [facility], you and your health needs are our priority.  As part of our continuing mission to provide you with exceptional heart care, our providers are all part of one team.  This team includes your primary Cardiologist (physician) and Advanced Practice Providers or APPs (Physician Assistants and Nurse Practitioners) who all work together to provide you with the care you need, when you need it.  Your next appointment:   October 2025 (12 month follow up)  Provider:   You may see Timothy Gollan, MD or one of the following Advanced Practice Providers on your designated Care Team:   Laneta Pintos, NP Gildardo Labrador, PA-C Varney Gentleman, PA-C Cadence [place], PA-C Ronald Cockayne, NP Morey Ar, NP    We recommend signing up for the patient portal called "MyChart".  Sign up information is provided on this After Visit Summary.  MyChart is used to connect with patients for Virtual Visits (Telemedicine).  Patients are able to view lab/test results, encounter notes, upcoming appointments, etc.  Non-urgent messages can be sent to your provider as well.   To learn more about what you can do with MyChart, go to [email or web address].

## 2023-09-28 NOTE — Progress Notes (Signed)
 Patient ID: Carlos Crosby, male   DOB: 16-Mar-1956, 68 y.o.   MRN: [id number]      Patient Care Team: Bluford Burkitt, NP as PCP - General (Nurse Practitioner) Devorah Fonder, MD as PCP - Cardiology (Cardiology) Verona Goodwill, MD as PCP - Electrophysiology (Cardiology) Devorah Fonder, MD as Consulting Physician (Cardiology) Shellie Dials, MD as Consulting Physician (Oncology)   HPI  Carlos Crosby is a 68 y.o. male Seen in follow-up because of permanent atrial fibrillation dating back to 2014. He declined catheter ablation at [facility] at that time. A strategy of rate control had been pursued; using a combination of verapamil , metoprolol  and digoxin   Also compound heterozygote with hemochromatosis on chronic phlebotomy  Previous cardiomyopathy with interval improvement   On Anticoagulation w Apixaban    no bleeding      He has Knee unloaders on to help with pain and so he is able to be physical   He notes he know he have gain the weight back that he lost due to the pandemic  He notes he will began his weight loss regime again with his wife's help   The patient denies chest pain, shortness of breath, nocturnal dyspnea, orthopnea or peripheral edema.  There have been no palpitations, lightheadedness or syncope.    Has lost 60 lbs,  swimming 4/w and being wlked by his dog   DATE TEST EF   12/16    Echo   30-35 %   6/17    Echo  45-50 %   7/17 Myoview   42% No ischemia  10/18 Echo   40-45%   5/19 Echo  50-55%   1/23 Echo  50-55% BAE severe   DATE TEST Mean HR   2/16    Holter 93 (52-185)      6/17    Holter 75(38-132)         Date Cr K Hgb Dig  1/17     1.1  8/18 0.99 4.6  0.5 (10/18)   1/19 0.90 4.6 16.7   12/19 0.87 4.4 15.8 1.6  9/20 0.85 4.5 17.1 0.6 (6/20)  5/22 0.97 4.3 18.0/(6/22)17.4 0.5 (9/21)  12/24 0.99 4.3 18.1    Past Medical History:  Diagnosis Date   Chronic a-fib (HCC)    Chronic systolic CHF (congestive heart failure) (HCC)    Depression     Edema 09/26/2014   Hemochromatosis    History of kidney stones    History of pulmonary embolism    Hyperlipidemia    Hypertension    Migraine    Obesity    OSA (obstructive sleep apnea)    on CPAP   Osteoarthritis    bilateral knees   Rocky Mountain spotted fever 2011    Past Surgical History:  Procedure Laterality Date   CARDIAC CATHETERIZATION  2012   [facility]   KNEE SURGERY     right knee    LITHOTRIPSY  07/31/2014   URETERAL STENT PLACEMENT  2016   URETEROSCOPY      Current Outpatient Medications  Medication Sig Dispense Refill   acetaminophen (TYLENOL) 500 MG tablet Take 1,000 mg by mouth every 6 (six) hours as needed.     apixaban  (ELIQUIS ) 5 MG TABS tablet Take 1 tablet (5 mg total) by mouth 2 (two) times daily. 180 tablet 1   Cholecalciferol (VITAMIN D ) 2000 units CAPS daily.      digoxin  (LANOXIN ) 0.25 MG tablet TAKE 1 TABLET DAILY 90 tablet 3  furosemide  (LASIX ) 20 MG tablet Take 1 tablet (20 mg total) by mouth daily as needed for edema. 90 tablet 3   loratadine (CLARITIN) 10 MG tablet Take 10 mg by mouth daily.     metoprolol  succinate (TOPROL -XL) 100 MG 24 hr tablet Take 1 tablet (100 mg total) by mouth in the morning and at bedtime. 180 tablet 3   Multiple Vitamins-Minerals (CENTRUM SILVER  ADULT 50+) TABS Take 1 tablet by mouth daily.     pravastatin  (PRAVACHOL ) 80 MG tablet TAKE 1 TABLET DAILY 90 tablet 3   sacubitril-valsartan (ENTRESTO ) 24-26 MG Take 1 tablet by mouth 2 (two) times daily. (Patient taking differently: Take 1 tablet by mouth 2 (two) times daily. Pt takes 1/2 tab twice per day) 180 tablet 3   traMADol  (ULTRAM ) 50 MG tablet TAKE 1 TABLET(50 MG) BY MOUTH EVERY 6 HOURS AS NEEDED FOR PAIN 90 tablet 2   No current facility-administered medications for this visit.    Allergies  Allergen Reactions   Allopurinol Hives and Swelling   Diltiazem  Itching and Rash   Chlorhexidine      lump in throat    Peridex [Chlorhexidine Gluconate]     Mouth swelling   Blisters    Ondansetron Rash   Penicillins Other (See Comments) and Nausea Only    Pt unsure of rxn; showed up on allergy test Pt unsure of rxn; showed up on allergy test Pt unsure of rxn; showed up on allergy test And dirrhea    Review of Systems negative except from HPI and PMH  Physical Exam: BP 103/63 (BP Location: Left Arm)   Pulse 81   Ht 5\' 11"  (1.803 m)   Wt (!) 308 lb (139.7 kg)   SpO2 96%   BMI 42.96 kg/m  Well developed and  Morbidly obese  in no acute distress HENT normal Neck supple Clear Irregularly Irregular rate and rhythm with controlled  ventricular response  Abd-soft with active BS No Clubbing cyanosis edema Skin-warm and dry A & Oriented  Grossly normal sensory and motor function  ECG atrial fib @ 81 -/10/37  Assessment and  Plan:   Afib  now controlled ventricular response  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

## 2023-10-03 DIAGNOSIS — D224 Melanocytic nevi of scalp and neck: Secondary | ICD-10-CM | POA: Diagnosis not present

## 2023-10-03 DIAGNOSIS — L570 Actinic keratosis: Secondary | ICD-10-CM | POA: Diagnosis not present

## 2023-10-03 LAB — JAK2 GENOTYPR

## 2023-10-03 LAB — DIGOXIN LEVEL: Digoxin, Serum: 1 ng/mL — ABNORMAL HIGH (ref 0.5–0.9)

## 2023-10-05 ENCOUNTER — Encounter: Payer: Self-pay | Admitting: Internal Medicine

## 2023-10-06 ENCOUNTER — Encounter: Payer: Self-pay | Admitting: Cardiovascular Disease

## 2023-10-06 DIAGNOSIS — I4891 Unspecified atrial fibrillation: Secondary | ICD-10-CM

## 2023-10-11 ENCOUNTER — Ambulatory Visit: Payer: Medicare (Managed Care) | Admitting: Dermatology

## 2023-10-11 MED ORDER — APIXABAN 5 MG PO TABS: 5.0000 mg | ORAL_TABLET | Freq: Two times a day (BID) | ORAL | 1 refills | Status: DC

## 2023-10-11 NOTE — Addendum Note (Signed)
 Addended by: Lorriane Rote T on: 10/11/2023 02:28 PM   Modules accepted: Orders

## 2023-10-11 NOTE — Telephone Encounter (Signed)
 Hey, I think he needed a prescription printed and him pick it up- is this okay for me to do as well?   Thanks!

## 2023-10-11 NOTE — Telephone Encounter (Signed)
 Prescription refill request for Eliquis  received. Indication: Afib  Last office visit: 09/28/23 Rodolfo Clan)  Scr: 0.99 (05/15/23)  Age: 68 Weight: 139.7kg  Appropriate dose. Refill sent.

## 2023-10-20 ENCOUNTER — Telehealth: Payer: Self-pay | Admitting: Urology

## 2023-10-20 DIAGNOSIS — N200 Calculus of kidney: Secondary | ICD-10-CM

## 2023-10-20 NOTE — Telephone Encounter (Signed)
 Pt called and explained that yearly he sees Dr.Brandon and he gets an Xray to view if an kidney stones. Pt is calling to request the Xray again for current year.

## 2023-10-20 NOTE — Telephone Encounter (Signed)
 Patient was on 2 year recall list. Patient would like to follow up with Dr Ace Holder before she leaves the practice. Appointment scheduled. Order placed.

## 2023-10-26 ENCOUNTER — Telehealth: Payer: Self-pay

## 2023-10-26 ENCOUNTER — Other Ambulatory Visit (HOSPITAL_COMMUNITY): Payer: Self-pay

## 2023-10-26 NOTE — Telephone Encounter (Signed)
 VM from patient regarding concerns about grant going against deductible. Returned call to discuss claims with patient. Addressed concerns. Patient expressed understanding.

## 2023-11-03 ENCOUNTER — Encounter: Payer: Self-pay | Admitting: Nurse Practitioner

## 2023-11-14 NOTE — Telephone Encounter (Signed)
 This has been addressed in a separate encounter dated 10/06/2023.  Please see that encounter for complete details.

## 2023-11-15 ENCOUNTER — Encounter: Payer: Self-pay | Admitting: Physician Assistant

## 2023-11-15 ENCOUNTER — Ambulatory Visit
Admission: RE | Admit: 2023-11-15 | Discharge: 2023-11-15 | Disposition: A | Discharge status: Home / Self Care | Payer: Medicare (Managed Care) | Source: Ambulatory Visit | Attending: Urology | Admitting: Urology

## 2023-11-15 ENCOUNTER — Ambulatory Visit: Payer: Medicare (Managed Care) | Admitting: Physician Assistant

## 2023-11-15 ENCOUNTER — Encounter: Payer: Self-pay | Admitting: Oncology

## 2023-11-15 DIAGNOSIS — N200 Calculus of kidney: Secondary | ICD-10-CM | POA: Diagnosis not present

## 2023-11-15 DIAGNOSIS — M170 Bilateral primary osteoarthritis of knee: Secondary | ICD-10-CM

## 2023-11-15 MED ORDER — SODIUM HYALURONATE 60 MG/3ML IX PRSY
60.0000 mg | PREFILLED_SYRINGE | INTRA_ARTICULAR | Status: AC | PRN
  Administered 2023-11-15: 60 mg via INTRA_ARTICULAR

## 2023-11-15 MED ORDER — SODIUM HYALURONATE 60 MG/3ML IX PRSY
60.0000 mg | PREFILLED_SYRINGE | Freq: Once | INTRA_ARTICULAR | Status: AC
Start: 2023-11-15 — End: 2023-11-15
  Administered 2023-11-15: 60 mg via INTRA_ARTICULAR

## 2023-11-15 NOTE — Progress Notes (Signed)
 Office Visit Note   Patient: Carlos Crosby           Date of Birth: 1955/12/19           MRN: [id number] Visit Date: 11/15/2023              Requested by: Bluford Burkitt, NP [address] [place],  [place] [address] PCP: Bluford Burkitt, NP  No chief complaint on file.     HPI: Mykle is a pleasant 68 year old gentleman with end-stage osteoarthritis of his knees.  Comes in today for Duralone injection.  He has had Visco supplementation in the past and done well.  Assessment & Plan: Visit Diagnoses:  1. Primary osteoarthritis of both knees     Plan: Will continue with current treatment.  Patient has multiple comorbidities and is not interested in any surgery at this time.  Certainly as long as he gets relief this is a very good way to handle his arthritis  Follow-Up Instructions: No follow-ups on file.   Ortho Exam  Patient is alert, oriented, no adenopathy, well-dressed, normal affect, normal respiratory effort. Bilateral knees no effusion no erythema compartments are soft and compressible he is neurovascularly intact    Imaging: No results found. No images are attached to the encounter.  Labs: Lab Results  Component Value Date   HGBA1C 5.7 04/24/2023   HGBA1C 5.4 07/08/2022   HGBA1C 5.4 03/29/2021   LABURIC 7.2 03/17/2023   LABURIC 6.8 03/29/2021   LABURIC 7.8 07/11/2017     Lab Results  Component Value Date   ALBUMIN 4.1 05/15/2023   ALBUMIN 4.3 10/17/2022   ALBUMIN 4.7 07/12/2021    No results found for: MG Lab Results  Component Value Date   VD25OH 39.87 04/24/2023   VD25OH 43.76 05/18/2018   VD25OH 32.32 01/15/2018    No results found for: PREALBUMIN    Latest Ref Rng & Units 09/11/2023    1:08 PM 05/15/2023   12:31 PM 01/11/2023    8:50 AM  CBC EXTENDED  WBC 4.0 - 10.5 K/uL 7.4  7.7  5.8   RBC 4.22 - 5.81 MIL/uL 5.60  5.16  5.46   Hemoglobin 13.0 - 17.0 g/dL 30.1  60.1  09.3   HCT 39.0 - 52.0 % 53.7  49.9  51.9   Platelets 150 -  400 K/uL 199  171  170   NEUT# 1.7 - 7.7 K/uL 4.0  3.7  3.3   Lymph# 0.7 - 4.0 K/uL 2.5  3.0  1.9      There is no height or weight on file to calculate BMI.  Orders:  No orders of the defined types were placed in this encounter.  No orders of the defined types were placed in this encounter.    Procedures: Large Joint Inj: bilateral knee on 11/15/2023 8:05 AM Indications: pain and diagnostic evaluation Details: 22 G 1.5 in needle, anterolateral approach  Arthrogram: No  Medications (Right): 60 mg Sodium Hyaluronate 60 MG/3ML Medications (Left): 60 mg Sodium Hyaluronate 60 MG/3ML Outcome: tolerated well, no immediate complications Procedure, treatment alternatives, risks and benefits explained, specific risks discussed. Consent was given by the patient. Immediately prior to procedure a time out was called to verify the correct patient, procedure, equipment, support staff and site/side marked as required.      Clinical Data: No additional findings.  ROS:  All other systems negative, except as noted in the HPI. Review of Systems  Objective: Vital Signs: There were no vitals  taken for this visit.  Specialty Comments:  No specialty comments available.  PMFS History: Patient Active Problem List   Diagnosis Date Noted   Viral gastroenteritis 08/01/2023   Acute gout of left ankle 03/17/2023   Actinic keratoses 10/17/2022   Elevated MCV 10/19/2021   History of gout 03/29/2021   Morbid (severe) obesity due to excess calories (HCC) 09/29/2020   Trigger finger, left ring finger 05/07/2020   Rash 10/08/2019   Fall 02/05/2019   Neuropathy 11/16/2017   Prediabetes 11/16/2017   Vitamin D  deficiency 11/16/2017   Hemochromatosis, hereditary (HCC) 07/23/2017   Allergic rhinitis 01/26/2017   Blue nevus 05/16/2016   Hair loss 05/16/2016   Abdominal pain 05/16/2016   Umbilical hernia without obstruction and without gangrene 05/16/2016   Atrial fibrillation (HCC) 09/26/2014    Chronic diastolic congestive heart failure (HCC) 09/26/2014   Anxiety 09/26/2014   Obstructive sleep apnea on CPAP 09/26/2014   Essential hypertension 09/26/2014   Hyperlipidemia 09/26/2014   Osteoarthritis of both knees 09/26/2014   Kidney stone 09/26/2014   Past Medical History:  Diagnosis Date   Chronic a-fib (HCC)    Chronic systolic CHF (congestive heart failure) (HCC)    Depression    Edema 09/26/2014   Hemochromatosis    History of kidney stones    History of pulmonary embolism    Hyperlipidemia    Hypertension    Migraine    Obesity    OSA (obstructive sleep apnea)    on CPAP   Osteoarthritis    bilateral knees   Rocky Mountain spotted fever 2011    Family History  Problem Relation Age of Onset   Thyroid  disease Mother    Cancer Father        lung   Arthritis Father    Hypertension Father    Cancer Paternal Grandfather        Throat cancer    Past Surgical History:  Procedure Laterality Date   CARDIAC CATHETERIZATION  2012   [facility]   KNEE SURGERY     right knee    LITHOTRIPSY  07/31/2014   URETERAL STENT PLACEMENT  2016   URETEROSCOPY     Social History   Occupational History   Not on file  Tobacco Use   Smoking status: Never   Smokeless tobacco: Never  Vaping Use   Vaping status: Never Used  Substance and Sexual Activity   Alcohol use: No    Comment: occasional; 3 times a year   Drug use: No   Sexual activity: Not Currently

## 2023-11-16 ENCOUNTER — Ambulatory Visit: Payer: Medicare (Managed Care) | Admitting: Urology

## 2023-11-16 ENCOUNTER — Encounter: Payer: Self-pay | Admitting: Urology

## 2023-11-16 VITALS — BP 124/83 | HR 81 | Ht 71.0 in | Wt 290.0 lb

## 2023-11-16 DIAGNOSIS — Z87442 Personal history of urinary calculi: Secondary | ICD-10-CM

## 2023-11-16 DIAGNOSIS — N200 Calculus of kidney: Secondary | ICD-10-CM

## 2023-11-16 NOTE — Progress Notes (Signed)
 Carlos Crosby,acting as a scribe for Carlos Gimenez, MD.,have documented all relevant documentation on the behalf of Carlos Gimenez, MD,as directed by  Carlos Gimenez, MD while in the presence of Carlos Gimenez, MD.  11/16/23 3:04 PM   Carlos Crosby 26-Mar-1956 [id number]  Referring provider: Bluford Burkitt, NP [address] [place],  [place] [address]  Chief Complaint  Patient presents with   Nephrolithiasis    HPI: 68 year old male with a personal history of kidney stones presents today for follow-up. He has a history of kidney stones, last seen in 2023, and has undergone multiple procedures including PCNL, shockwave, and laser lihotripsy in the past.   Previous stone analysis showed 97% calcium oxalate monohydrate, 10% calcium oxalate dihydrate, and calcium phosphate. He has had 25 urines in the past that were fairly unremarkable.   His last kidney stone evaluation was in 2023, which showed a stable left-sided kidney stone. A new kidney stone evaluation was performed today, and it was compared to the one from 2023, showing essentially unchanged findings with an 8mm stone that has been stable for several years.   He reports no kidney stone issues in the past couple of years and no urinary problems.   He maintains a regimen of ensuring his urine is clear by drinking more water if it starts to get too dark. He has lost 65 pounds since the last visit, which has led to a reduction in some of his medications, including Metoprolol  and Entresto . He attributes his weight loss to swimming four times a week and maintaining a diet of 1,500 calories a day with less than 100 carbs.   PMH: Past Medical History:  Diagnosis Date   Chronic a-fib (HCC)    Chronic systolic CHF (congestive heart failure) (HCC)    Depression    Edema 09/26/2014   Hemochromatosis    History of kidney stones    History of pulmonary embolism    Hyperlipidemia    Hypertension    Migraine    Obesity     OSA (obstructive sleep apnea)    on CPAP   Osteoarthritis    bilateral knees   Rocky Mountain spotted fever 2011    Surgical History: Past Surgical History:  Procedure Laterality Date   CARDIAC CATHETERIZATION  2012   [facility]   KNEE SURGERY     right knee    LITHOTRIPSY  07/31/2014   URETERAL STENT PLACEMENT  2016   URETEROSCOPY      Home Medications:  Allergies as of 11/16/2023       Reactions   Allopurinol Hives, Swelling   Diltiazem  Itching, Rash   Chlorhexidine     lump in throat    Peridex [chlorhexidine Gluconate]    Mouth swelling  Blisters    Ondansetron Rash   Penicillins Other (See Comments), Nausea Only   Pt unsure of rxn; showed up on allergy test Pt unsure of rxn; showed up on allergy test Pt unsure of rxn; showed up on allergy test And dirrhea        Medication List        Accurate as of November 16, 2023  3:04 PM. If you have any questions, ask your nurse or doctor.          acetaminophen 500 MG tablet Commonly known as: TYLENOL Take 1,000 mg by mouth every 6 (six) hours as needed.   apixaban  5 MG Tabs tablet Commonly known as: Eliquis  Take 1 tablet (5  mg total) by mouth 2 (two) times daily.   Centrum Silver  Adult 50+ Tabs Take 1 tablet by mouth daily.   digoxin  0.25 MG tablet Commonly known as: LANOXIN  TAKE 1 TABLET DAILY   Entresto  24-26 MG Generic drug: sacubitril-valsartan Take 1 tablet by mouth 2 (two) times daily. What changed: additional instructions   furosemide  20 MG tablet Commonly known as: LASIX  Take 1 tablet (20 mg total) by mouth daily as needed for edema.   loratadine 10 MG tablet Commonly known as: CLARITIN Take 10 mg by mouth daily.   metoprolol  succinate 100 MG 24 hr tablet Commonly known as: TOPROL -XL Take 1 tablet (100 mg total) by mouth in the morning and at bedtime.   pravastatin  80 MG tablet Commonly known as: PRAVACHOL  TAKE 1 TABLET DAILY   traMADol  50 MG tablet Commonly known as: ULTRAM  TAKE 1  TABLET(50 MG) BY MOUTH EVERY 6 HOURS AS NEEDED FOR PAIN   Vitamin D  50 MCG (2000 UT) Caps daily.        Allergies:  Allergies  Allergen Reactions   Allopurinol Hives and Swelling   Diltiazem  Itching and Rash   Chlorhexidine      lump in throat    Peridex [Chlorhexidine Gluconate]     Mouth swelling  Blisters    Ondansetron Rash   Penicillins Other (See Comments) and Nausea Only    Pt unsure of rxn; showed up on allergy test Pt unsure of rxn; showed up on allergy test Pt unsure of rxn; showed up on allergy test And dirrhea     Family History: Family History  Problem Relation Age of Onset   Thyroid  disease Mother    Cancer Father        lung   Arthritis Father    Hypertension Father    Cancer Paternal Grandfather        Throat cancer    Social History:  reports that he has never smoked. He has never used smokeless tobacco. He reports that he does not drink alcohol and does not use drugs.   Physical Exam: BP 124/83   Pulse 81   Ht 5' 11 (1.803 m)   Wt 290 lb (131.5 kg)   BMI 40.45 kg/m   Constitutional:  Alert and oriented, No acute distress. HEENT: [place] AT, moist mucus membranes.  Trachea midline, no masses. Neurologic: Grossly intact, no focal deficits, moving all 4 extremities. Psychiatric: Normal mood and affect.  Pertinent Imaging:  KUB performed today was personally reviewed and compared to imaging below. Radiologic interpretation is pending.  EXAM: ABDOMEN - 1 VIEW   COMPARISON:  03/22/2019   FINDINGS: Bowel gas pattern is nonobstructed. No evidence for organomegaly. No acute fracture.   Numerous calcifications overlie the LEFT renal contour, largest measuring 8 millimeters. No plain film evidence for ureteral stones.   IMPRESSION: 1. No acute findings. 2. LEFT nephrolithiasis.     Electronically Signed   By: Anitra Barn M.D.   On: 04/21/2022 14:53  Assessment & Plan:    1. Stable left-sided kidney stone - His kidney stone  remains unchanged at 8mm over several years. - No intervention is necessary at this time.  - He is advised to continue monitoring urine clarity and maintain adequate hydration to prevent stone growth or new stone formation.  2. History of kidney stones - He has a history of kidney stones with previous cell analysis showing 97% calcium oxalate monohydrate.  - He is advised to continue dietary modifications to manage stone  risk factors.  3. Cardiovascular health and weight management - He has lost 65 pounds since the last visit, resulting in a reduction of Metoprolol  from 300mg  to 200mg  and a decrease in Entresto  dosage.  - He is encouraged to maintain current lifestyle changes, including swimming and a controlled diet, to support cardiovascular health and weight management.  Return in about 2 years (around 11/15/2025) for PA visit for monitoring with KUB, unless acute issues arise.  I have reviewed the above documentation for accuracy and completeness, and I agree with the above.   Carlos Gimenez, MD    [facility] [address] [place], [place] [address] [phone]

## 2023-11-21 ENCOUNTER — Encounter: Payer: Self-pay | Admitting: Nurse Practitioner

## 2023-11-21 ENCOUNTER — Ambulatory Visit (INDEPENDENT_AMBULATORY_CARE_PROVIDER_SITE_OTHER): Payer: Medicare (Managed Care) | Admitting: Nurse Practitioner

## 2023-11-21 VITALS — BP 100/60 | HR 100 | Temp 97.7°F | Ht 71.0 in | Wt 305.2 lb

## 2023-11-21 DIAGNOSIS — M170 Bilateral primary osteoarthritis of knee: Secondary | ICD-10-CM | POA: Diagnosis not present

## 2023-11-21 DIAGNOSIS — G4733 Obstructive sleep apnea (adult) (pediatric): Secondary | ICD-10-CM

## 2023-11-21 DIAGNOSIS — R7303 Prediabetes: Secondary | ICD-10-CM | POA: Diagnosis not present

## 2023-11-21 DIAGNOSIS — I10 Essential (primary) hypertension: Secondary | ICD-10-CM | POA: Diagnosis not present

## 2023-11-21 DIAGNOSIS — E782 Mixed hyperlipidemia: Secondary | ICD-10-CM

## 2023-11-21 DIAGNOSIS — N200 Calculus of kidney: Secondary | ICD-10-CM

## 2023-11-21 DIAGNOSIS — I4821 Permanent atrial fibrillation: Secondary | ICD-10-CM | POA: Diagnosis not present

## 2023-11-21 NOTE — Progress Notes (Unsigned)
 Bluford Burkitt, NP-C Phone: [phone]  Carlos Crosby is a 68 y.o. male who presents today for follow up.   ***  Social History   Tobacco Use  Smoking Status Never  Smokeless Tobacco Never    Current Outpatient Medications on File Prior to Visit  Medication Sig Dispense Refill   acetaminophen (TYLENOL) 500 MG tablet Take 1,000 mg by mouth every 6 (six) hours as needed.     apixaban  (ELIQUIS ) 5 MG TABS tablet Take 1 tablet (5 mg total) by mouth 2 (two) times daily. 180 tablet 1   Cholecalciferol (VITAMIN D ) 2000 units CAPS daily.      digoxin  (LANOXIN ) 0.25 MG tablet TAKE 1 TABLET DAILY 90 tablet 3   furosemide  (LASIX ) 20 MG tablet Take 1 tablet (20 mg total) by mouth daily as needed for edema. 90 tablet 3   loratadine (CLARITIN) 10 MG tablet Take 10 mg by mouth daily.     metoprolol  succinate (TOPROL -XL) 100 MG 24 hr tablet Take 1 tablet (100 mg total) by mouth in the morning and at bedtime. 180 tablet 3   Multiple Vitamins-Minerals (CENTRUM SILVER  ADULT 50+) TABS Take 1 tablet by mouth daily.     pravastatin  (PRAVACHOL ) 80 MG tablet TAKE 1 TABLET DAILY 90 tablet 3   sacubitril-valsartan (ENTRESTO ) 24-26 MG Take 1 tablet by mouth 2 (two) times daily. (Patient taking differently: Take 1 tablet by mouth 2 (two) times daily. Pt takes 1/2 tab twice per day) 180 tablet 3   traMADol  (ULTRAM ) 50 MG tablet TAKE 1 TABLET(50 MG) BY MOUTH EVERY 6 HOURS AS NEEDED FOR PAIN 90 tablet 2   No current facility-administered medications on file prior to visit.     ROS see history of present illness  Objective  Physical Exam Vitals:   11/21/23 0940  BP: 100/60  Pulse: 100  Temp: 97.7 F (36.5 C)  SpO2: 92%    BP Readings from Last 3 Encounters:  11/21/23 100/60  11/16/23 124/83  09/28/23 103/63   Wt Readings from Last 3 Encounters:  11/21/23 (!) 305 lb 3.2 oz (138.4 kg)  11/16/23 290 lb (131.5 kg)  09/28/23 (!) 308 lb (139.7 kg)    Physical Exam Constitutional:      General:  He is not in acute distress.    Appearance: Normal appearance.  HENT:     Head: Normocephalic.   Cardiovascular:     Rate and Rhythm: Normal rate. Rhythm irregularly irregular.     Heart sounds: Normal heart sounds.  Pulmonary:     Effort: Pulmonary effort is normal.     Breath sounds: Normal breath sounds.   Skin:    General: Skin is warm and dry.   Neurological:     General: No focal deficit present.     Mental Status: He is alert.   Psychiatric:        Mood and Affect: Mood normal.        Behavior: Behavior normal.      Assessment/Plan: Please see individual problem list.  Prediabetes -     Hemoglobin A1c; Future  Mixed hyperlipidemia -     Lipid panel; Future  Morbid (severe) obesity due to excess calories (HCC)  Obstructive sleep apnea on CPAP  Permanent atrial fibrillation (HCC)  Essential hypertension -     Comprehensive metabolic panel with GFR; Future     Health Maintenance: ***  Return for fasting labs then in 6 months for follow up.   Bluford Burkitt, NP-C [facility]

## 2023-11-22 ENCOUNTER — Other Ambulatory Visit (INDEPENDENT_AMBULATORY_CARE_PROVIDER_SITE_OTHER): Payer: Medicare (Managed Care)

## 2023-11-22 DIAGNOSIS — E782 Mixed hyperlipidemia: Secondary | ICD-10-CM

## 2023-11-22 DIAGNOSIS — I10 Essential (primary) hypertension: Secondary | ICD-10-CM | POA: Diagnosis not present

## 2023-11-22 DIAGNOSIS — R7303 Prediabetes: Secondary | ICD-10-CM | POA: Diagnosis not present

## 2023-11-22 LAB — LIPID PANEL
Cholesterol: 124 mg/dL (ref 0–200)
HDL: 37.8 mg/dL — ABNORMAL LOW (ref >39.00)
LDL Cholesterol: 56 mg/dL (ref 0–99)
NonHDL: 85.81
Total CHOL/HDL Ratio: 3
Triglycerides: 148 mg/dL (ref 0.0–149.0)
VLDL: 29.6 mg/dL (ref 0.0–40.0)

## 2023-11-22 LAB — COMPREHENSIVE METABOLIC PANEL WITH GFR
ALT: 17 U/L (ref 0–53)
AST: 18 U/L (ref 0–37)
Albumin: 4.4 g/dL (ref 3.5–5.2)
Alkaline Phosphatase: 43 U/L (ref 39–117)
BUN: 17 mg/dL (ref 6–23)
CO2: 31 meq/L (ref 19–32)
Calcium: 9.3 mg/dL (ref 8.4–10.5)
Chloride: 101 meq/L (ref 96–112)
Creatinine, Ser: 0.99 mg/dL (ref 0.40–1.50)
GFR: 79.2 mL/min (ref >60.00)
Glucose, Bld: 89 mg/dL (ref 70–99)
Potassium: 4.5 meq/L (ref 3.5–5.1)
Sodium: 138 meq/L (ref 135–145)
Total Bilirubin: 1.5 mg/dL — ABNORMAL HIGH (ref 0.2–1.2)
Total Protein: 6.7 g/dL (ref 6.0–8.3)

## 2023-11-22 LAB — HEMOGLOBIN A1C: Hgb A1c MFr Bld: 5.4 % (ref 4.6–6.5)

## 2023-11-27 ENCOUNTER — Ambulatory Visit: Payer: Self-pay | Admitting: Nurse Practitioner

## 2023-11-27 DIAGNOSIS — R17 Unspecified jaundice: Secondary | ICD-10-CM

## 2023-11-28 ENCOUNTER — Encounter: Payer: Self-pay | Admitting: Nurse Practitioner

## 2023-11-28 DIAGNOSIS — G4733 Obstructive sleep apnea (adult) (pediatric): Secondary | ICD-10-CM | POA: Diagnosis not present

## 2023-11-28 NOTE — Assessment & Plan Note (Signed)
 Chronic atrial fibrillation is managed with Eliquis  and regular cardiology follow-up. Digoxin  level is higher than preferred but acceptable, with dosage adjusted to six days a week. Continue current medications: Eliquis , digoxin , toprol , and Entresto . Encourage hydration, especially during illness. Follow up with Cardiology as scheduled.

## 2023-11-28 NOTE — Assessment & Plan Note (Signed)
 Chronic knee osteoarthritis is managed with gel injections, Tramadol  every 6 hours as needed, and regular orthopedic follow-up. No new symptoms. Continue current management, follow up with Ortho as scheduled.

## 2023-11-28 NOTE — Assessment & Plan Note (Signed)
 Chronic kidney stones have been stable for four years. No intervention required. Follow up with Urology as scheduled.

## 2023-11-28 NOTE — Assessment & Plan Note (Signed)
 Currently on pravastatin  with no new muscle aches. Continue pravastatin  80 mg daily. Return for fasting lipid panel.

## 2023-11-28 NOTE — Assessment & Plan Note (Signed)
 Blood pressure is well controlled on current medication regimen. Continue Metoprolol  and Entresto  as prescribed.

## 2023-11-28 NOTE — Assessment & Plan Note (Signed)
 Obstructive sleep apnea is managed with CPAP. He reports compliance and improved restfulness. No recent pulmonology follow-up. Continue nightly CPAP use.

## 2023-11-28 NOTE — Assessment & Plan Note (Signed)
 Check A1c today. Encourage healthy diet and regular exercise.

## 2023-11-28 NOTE — Assessment & Plan Note (Addendum)
 Chronic hemochromatosis is managed with phlebotomies. JAK2 test is negative for polycythemia vera. Elevated hemoglobin is likely due to hemochromatosis. Discussed blood donation through the [employer]. Schedule phlebotomy next month. Follow up with Hematology as scheduled.

## 2023-11-30 ENCOUNTER — Ambulatory Visit: Payer: Self-pay | Admitting: Nurse Practitioner

## 2023-11-30 ENCOUNTER — Other Ambulatory Visit (INDEPENDENT_AMBULATORY_CARE_PROVIDER_SITE_OTHER): Payer: Medicare (Managed Care)

## 2023-11-30 DIAGNOSIS — R17 Unspecified jaundice: Secondary | ICD-10-CM | POA: Diagnosis not present

## 2023-11-30 DIAGNOSIS — M170 Bilateral primary osteoarthritis of knee: Secondary | ICD-10-CM

## 2023-11-30 LAB — HEPATIC FUNCTION PANEL
ALT: 17 U/L (ref 0–53)
AST: 16 U/L (ref 0–37)
Albumin: 4.3 g/dL (ref 3.5–5.2)
Alkaline Phosphatase: 42 U/L (ref 39–117)
Bilirubin, Direct: 0.2 mg/dL (ref 0.0–0.3)
Total Bilirubin: 1.1 mg/dL (ref 0.2–1.2)
Total Protein: 6.6 g/dL (ref 6.0–8.3)

## 2023-12-01 ENCOUNTER — Other Ambulatory Visit: Payer: Self-pay | Admitting: Nurse Practitioner

## 2023-12-01 DIAGNOSIS — M170 Bilateral primary osteoarthritis of knee: Secondary | ICD-10-CM

## 2023-12-01 NOTE — Telephone Encounter (Signed)
 Requesting: TRAMADOL  Contract: No UDS: NO Last Visit: 11/21/2023 Next Visit: 05/22/2024 Last Refill: E-Prescribing Status: Receipt confirmed by pharmacy (08/21/2023  8:54 AM EDT)   Please Advise

## 2023-12-06 ENCOUNTER — Other Ambulatory Visit: Payer: Self-pay

## 2023-12-06 ENCOUNTER — Telehealth: Payer: Self-pay | Admitting: Cardiology

## 2023-12-06 DIAGNOSIS — M1711 Unilateral primary osteoarthritis, right knee: Secondary | ICD-10-CM

## 2023-12-06 DIAGNOSIS — M1712 Unilateral primary osteoarthritis, left knee: Secondary | ICD-10-CM

## 2023-12-06 DIAGNOSIS — I4821 Permanent atrial fibrillation: Secondary | ICD-10-CM

## 2023-12-06 NOTE — Telephone Encounter (Signed)
 Patient is due for updated digoxin  level.   Please have him hold his dig medication one morning and come to the office for lab, then take the medication after the lab is drawn.

## 2023-12-06 NOTE — Telephone Encounter (Signed)
 Called patient, made aware of the need for lab.   Patient aware of instructions. Patient verbalized understanding.

## 2023-12-12 DIAGNOSIS — I4821 Permanent atrial fibrillation: Secondary | ICD-10-CM | POA: Diagnosis not present

## 2023-12-13 ENCOUNTER — Ambulatory Visit: Payer: Self-pay | Admitting: Cardiology

## 2023-12-13 LAB — DIGOXIN LEVEL: Digoxin, Serum: 0.6 ng/mL (ref 0.5–0.9)

## 2023-12-20 ENCOUNTER — Encounter: Payer: Self-pay | Admitting: Nurse Practitioner

## 2023-12-26 ENCOUNTER — Other Ambulatory Visit: Payer: Self-pay | Admitting: Nurse Practitioner

## 2023-12-26 DIAGNOSIS — Z8739 Personal history of other diseases of the musculoskeletal system and connective tissue: Secondary | ICD-10-CM

## 2023-12-26 MED ORDER — PREDNISONE 10 MG PO TABS: ORAL_TABLET | ORAL | 1 refills | Status: DC

## 2024-01-05 ENCOUNTER — Telehealth: Payer: Self-pay | Admitting: Cardiovascular Disease

## 2024-01-05 NOTE — Telephone Encounter (Signed)
 Pt c/o medication issue:  1. Name of Medication:   digoxin  (LANOXIN ) 0.25 MG tablet   2. How are you currently taking this medication (dosage and times per day)?   3. Are you having a reaction (difficulty breathing--STAT)?   4. What is your medication issue?   Caller [facility]) stated they have a manufacturer change for this medication and will need a call back to confirm this change.  Caller stated can use invoice# [id number].

## 2024-01-07 ENCOUNTER — Encounter: Payer: Self-pay | Admitting: Cardiovascular Disease

## 2024-01-07 DIAGNOSIS — I4891 Unspecified atrial fibrillation: Secondary | ICD-10-CM

## 2024-01-08 MED ORDER — APIXABAN 5 MG PO TABS: 5.0000 mg | ORAL_TABLET | Freq: Two times a day (BID) | ORAL | 3 refills | Status: DC

## 2024-01-08 NOTE — Telephone Encounter (Signed)
 Pharmacy called back in. Informed them per Dr. Gollan that is okay to change manufacturers. They will go ahead and fill for pt.

## 2024-01-09 ENCOUNTER — Inpatient Hospital Stay: Payer: Medicare (Managed Care) | Attending: Oncology

## 2024-01-09 LAB — CBC WITH DIFFERENTIAL/PLATELET
Abs Immature Granulocytes: 0.04 K/uL (ref 0.00–0.07)
Basophils Absolute: 0.1 K/uL (ref 0.0–0.1)
Basophils Relative: 1 %
Eosinophils Absolute: 0.2 K/uL (ref 0.0–0.5)
Eosinophils Relative: 3 %
HCT: 48.4 % (ref 39.0–52.0)
Hemoglobin: 17 g/dL (ref 13.0–17.0)
Immature Granulocytes: 1 %
Lymphocytes Relative: 41 %
Lymphs Abs: 2.9 K/uL (ref 0.7–4.0)
MCH: 34.6 pg — ABNORMAL HIGH (ref 26.0–34.0)
MCHC: 35.1 g/dL (ref 30.0–36.0)
MCV: 98.6 fL (ref 80.0–100.0)
Monocytes Absolute: 0.6 K/uL (ref 0.1–1.0)
Monocytes Relative: 9 %
Neutro Abs: 3.3 K/uL (ref 1.7–7.7)
Neutrophils Relative %: 45 %
Platelets: 172 K/uL (ref 150–400)
RBC: 4.91 MIL/uL (ref 4.22–5.81)
RDW: 13.3 % (ref 11.5–15.5)
WBC: 7.1 K/uL (ref 4.0–10.5)
nRBC: 0 % (ref 0.0–0.2)

## 2024-01-09 LAB — FERRITIN: Ferritin: 134 ng/mL (ref 24–336)

## 2024-01-09 LAB — IRON AND TIBC
Iron: 99 ug/dL (ref 45–182)
Saturation Ratios: 31 % (ref 17.9–39.5)
TIBC: 322 ug/dL (ref 250–450)
UIBC: 223 ug/dL

## 2024-01-10 ENCOUNTER — Other Ambulatory Visit: Payer: Self-pay

## 2024-01-12 ENCOUNTER — Inpatient Hospital Stay: Payer: Medicare (Managed Care) | Admitting: Oncology

## 2024-01-12 NOTE — Progress Notes (Signed)
 [facility]  [phone] [phone]  ID: Carlos Crosby OB: 06-11-1955  MR#: [id number]  RDW#:[id number]  Patient Care Team: Gretel App, NP as PCP - General (Nurse Practitioner) Perla Evalene PARAS, MD as PCP - Cardiology (Cardiology) Fernande Elspeth BROCKS, MD as PCP - Electrophysiology (Cardiology) Perla Evalene PARAS, MD as Consulting Physician (Cardiology) Jacobo Evalene PARAS, MD as Consulting Physician (Oncology)  I connected with Carlos Crosby on 01/13/24 at 11:15 AM EDT by video enabled telemedicine visit and verified that I am speaking with the correct person using two identifiers.   I discussed the limitations, risks, security and privacy concerns of performing an evaluation and management service by telemedicine and the availability of in-person appointments. I also discussed with the patient that there may be a patient responsible charge related to this service. The patient expressed understanding and agreed to proceed.   Other persons participating in the visit and their role in the encounter: Patient, MD.  Patient's location: Home. Provider's location: Clinic.  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  INTERVAL HISTORY: Patient agreed to video-assisted telemedicine visit for further evaluation, discussion of his laboratory results, and consideration of additional phlebotomy.  He continues to feel well and remains asymptomatic.  He has no neurologic complaints.  He denies any recent fevers or illnesses.  He denies any chest pain, shortness of breath, cough, or hemoptysis.  He denies any nausea, vomiting, constipation, or diarrhea.  He has no urinary complaints.  Patient offers no specific complaints today.  REVIEW OF SYSTEMS:   Review of Systems  Constitutional: Negative.  Negative for fever, malaise/fatigue and weight loss.  Respiratory: Negative.  Negative for cough, hemoptysis and shortness of  breath.   Cardiovascular: Negative.  Negative for chest pain and leg swelling.  Gastrointestinal: Negative.  Negative for abdominal pain, blood in stool and melena.  Genitourinary: Negative.  Negative for hematuria.  Musculoskeletal: Negative.  Negative for back pain.  Skin: Negative.  Negative for rash.  Neurological: Negative.  Negative for sensory change, focal weakness, weakness and headaches.  Psychiatric/Behavioral: Negative.  The patient is not nervous/anxious.     As per HPI. Otherwise, a complete review of systems is negative.  PAST MEDICAL HISTORY: Past Medical History:  Diagnosis Date   Chronic a-fib (HCC)    Chronic systolic CHF (congestive heart failure) (HCC)    Depression    Edema 09/26/2014   Hemochromatosis    History of kidney stones    History of pulmonary embolism    Hyperlipidemia    Hypertension    Migraine    Obesity    OSA (obstructive sleep apnea)    on CPAP   Osteoarthritis    bilateral knees   [facility] spotted fever 2011    PAST SURGICAL HISTORY: Past Surgical History:  Procedure Laterality Date   CARDIAC CATHETERIZATION  2012   [facility]   KNEE SURGERY     right knee    LITHOTRIPSY  07/31/2014   URETERAL STENT PLACEMENT  2016   URETEROSCOPY      FAMILY HISTORY: Family History  Problem Relation Age of Onset   Thyroid  disease Mother    Cancer Father        lung   Arthritis Father    Hypertension Father    Cancer Paternal Grandfather        Throat cancer    ADVANCED DIRECTIVES (Y/N):  N  HEALTH MAINTENANCE: Social History   Tobacco Use   Smoking  status: Never   Smokeless tobacco: Never  Vaping Use   Vaping status: Never Used  Substance Use Topics   Alcohol use: No    Comment: occasional; 3 times a year   Drug use: No     Colonoscopy:  PAP:  Bone density:  Lipid panel:  Allergies  Allergen Reactions   Allopurinol Hives and Swelling   Diltiazem  Itching and Rash   Chlorhexidine      lump in throat    Peridex  [Chlorhexidine Gluconate]     Mouth swelling  Blisters    Ondansetron Rash   Penicillins Other (See Comments) and Nausea Only    Pt unsure of rxn; showed up on allergy test Pt unsure of rxn; showed up on allergy test Pt unsure of rxn; showed up on allergy test And dirrhea     Current Outpatient Medications  Medication Sig Dispense Refill   acetaminophen (TYLENOL) 500 MG tablet Take 1,000 mg by mouth every 6 (six) hours as needed.     apixaban  (ELIQUIS ) 5 MG TABS tablet Take 1 tablet (5 mg total) by mouth 2 (two) times daily. 180 tablet 3   Cholecalciferol (VITAMIN D ) 2000 units CAPS daily.      digoxin  (LANOXIN ) 0.25 MG tablet TAKE 1 TABLET DAILY 90 tablet 3   furosemide  (LASIX ) 20 MG tablet Take 1 tablet (20 mg total) by mouth daily as needed for edema. 90 tablet 3   loratadine (CLARITIN) 10 MG tablet Take 10 mg by mouth daily.     metoprolol  succinate (TOPROL -XL) 100 MG 24 hr tablet Take 1 tablet (100 mg total) by mouth in the morning and at bedtime. 180 tablet 3   Multiple Vitamins-Minerals (CENTRUM SILVER  ADULT 50+) TABS Take 1 tablet by mouth daily.     pravastatin  (PRAVACHOL ) 80 MG tablet TAKE 1 TABLET DAILY 90 tablet 3   predniSONE  (DELTASONE ) 10 MG tablet TAKE 4 TABLETS PO QD FOR 3 DAYS THEN TAKE 3 TABLETS PO QD FOR 3 DAYS THEN TAKE 2 TABLET PO QD FOR 3 DAYS THEN TAKE 1 TAB PO QD FOR 3 DAYS. Start at the first sign of a gout flare. 30 tablet 1   sacubitril-valsartan (ENTRESTO ) 24-26 MG Take 1 tablet by mouth 2 (two) times daily. (Patient taking differently: Take 1 tablet by mouth 2 (two) times daily. Pt takes 1/2 tab twice per day) 180 tablet 3   traMADol  (ULTRAM ) 50 MG tablet TAKE 1 TABLET(50 MG) BY MOUTH EVERY 6 HOURS AS NEEDED FOR PAIN 90 tablet 2   No current facility-administered medications for this visit.    OBJECTIVE: There were no vitals filed for this visit.    There is no height or weight on file to calculate BMI.    ECOG FS:0 - Asymptomatic  General:  Well-developed, well-nourished, no acute distress. HEENT: Normocephalic. Neuro: Alert, answering all questions appropriately. Cranial nerves grossly intact. Psych: Normal affect.  LAB RESULTS:  Lab Results  Component Value Date   NA 138 11/22/2023   K 4.5 11/22/2023   CL 101 11/22/2023   CO2 31 11/22/2023   GLUCOSE 89 11/22/2023   BUN 17 11/22/2023   CREATININE 0.99 11/22/2023   CALCIUM 9.3 11/22/2023   PROT 6.6 11/30/2023   ALBUMIN 4.3 11/30/2023   AST 16 11/30/2023   ALT 17 11/30/2023   ALKPHOS 42 11/30/2023   BILITOT 1.1 11/30/2023   GFRNONAA >60 05/15/2023   GFRAA 107 09/05/2019    Lab Results  Component Value Date   WBC 7.1  01/09/2024   NEUTROABS 3.3 01/09/2024   HGB 17.0 01/09/2024   HCT 48.4 01/09/2024   MCV 98.6 01/09/2024   PLT 172 01/09/2024   Lab Results  Component Value Date   IRON 99 01/09/2024   TIBC 322 01/09/2024   IRONPCTSAT 31 01/09/2024   Lab Results  Component Value Date   FERRITIN 134 01/09/2024     STUDIES: No results found.  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  Patient's ferritin has trended up to 134.  Hemoglobin has trended down slightly and is now within normal limits.  Return to clinic next week for phlebotomy only.  It appears patient only requires phlebotomy approximately every 6 months.  Return to clinic in 6 months with laboratory work and video-assisted telemedicine visit.  Of note, because patient is on Eliquis  he cannot receive his phlebotomy through [facility]. Polycythemia: Resolved.  JAK2 mutation reported as negative.  Upon review of patient's chart, patient has had a persistently elevated hemoglobin despite the level of his iron stores since at least February 2021.   His hemoglobin has ranged between 17.0 and 18.2 over that timeframe.  Today's result is 17.0.  No intervention is  needed.  Phlebotomy as above.  I provided 20 minutes of face-to-face video visit time during this encounter which included chart review, counseling, and coordination of care as documented above.   Patient expressed understanding and was in agreement with this plan. He also understands that He can call clinic at any time with any questions, concerns, or complaints.    Evalene JINNY Reusing, MD   01/12/2024 11:28 AM

## 2024-01-13 ENCOUNTER — Encounter: Payer: Self-pay | Admitting: Oncology

## 2024-01-15 ENCOUNTER — Encounter: Payer: Self-pay | Admitting: Oncology

## 2024-01-18 ENCOUNTER — Other Ambulatory Visit: Payer: Self-pay | Admitting: Oncology

## 2024-01-18 ENCOUNTER — Inpatient Hospital Stay: Payer: Medicare (Managed Care)

## 2024-01-18 NOTE — Patient Instructions (Signed)

## 2024-01-18 NOTE — Progress Notes (Signed)
 Carlos Crosby presents today for phlebotomy per MD orders. Phlebotomy procedure started at 1323 and ended at 1334. 400 mls removed. Patient tolerated procedure well. IV needle removed intact.

## 2024-01-23 ENCOUNTER — Ambulatory Visit: Payer: Medicare (Managed Care) | Admitting: Nurse Practitioner

## 2024-01-26 ENCOUNTER — Other Ambulatory Visit: Payer: Self-pay | Admitting: Emergency Medicine

## 2024-01-26 DIAGNOSIS — E782 Mixed hyperlipidemia: Secondary | ICD-10-CM

## 2024-02-08 ENCOUNTER — Encounter: Payer: Self-pay | Admitting: *Deleted

## 2024-02-08 DIAGNOSIS — Z006 Encounter for examination for normal comparison and control in clinical research program: Secondary | ICD-10-CM

## 2024-02-08 NOTE — Research (Signed)
 Spoke  with Carlos Crosby informed him that he was receiving Drug during the [employer] Study. Encouraged him to follow up with his cardiologist to manage his Lipids. Voices understanding.

## 2024-02-26 DIAGNOSIS — G4733 Obstructive sleep apnea (adult) (pediatric): Secondary | ICD-10-CM | POA: Diagnosis not present

## 2024-02-26 DIAGNOSIS — G4730 Sleep apnea, unspecified: Secondary | ICD-10-CM | POA: Diagnosis not present

## 2024-03-06 ENCOUNTER — Other Ambulatory Visit: Payer: Self-pay | Admitting: Nurse Practitioner

## 2024-03-06 DIAGNOSIS — M170 Bilateral primary osteoarthritis of knee: Secondary | ICD-10-CM

## 2024-03-19 ENCOUNTER — Encounter: Payer: Self-pay | Admitting: Nurse Practitioner

## 2024-03-22 ENCOUNTER — Other Ambulatory Visit: Payer: Self-pay | Admitting: Nurse Practitioner

## 2024-03-22 DIAGNOSIS — G4733 Obstructive sleep apnea (adult) (pediatric): Secondary | ICD-10-CM

## 2024-03-23 NOTE — Progress Notes (Unsigned)
 Cardiology Office Note  Date:  03/25/2024   ID:  Carlos, Crosby [birth year], MRN [id number]  PCP:  Gretel App, NP   Chief Complaint  Patient presents with   12 month follow up     Denies any cardiac issues.     HPI:  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 who presents for routine follow-up of his permenant atrial fibrillation  Last seen in clinic by myself October 2024 Weight trending upwards 330 On prednisone  for gout  Taking metoprolol  succinate 100 BID, Entresto  1/2 pill BID (on hold for blood pressure was too low) Denies significant shortness of breath, no leg edema  Few gout flare-ups,  unable to tolerate allopurinol Finishing prednisone  for recent gout flare  Exercises at the gym, salt water pool 3 days a week  Labs reviewed Total chol 124, LDL 56 A1C 5.4 CBC followed by hematology  EKG personally reviewed by myself on todays visit EKG Interpretation Date/Time:  Monday March 25 2024 08:24:03 EDT Ventricular Rate:  87 PR Interval:    QRS Duration:  90 QT Interval:  356 QTC Calculation: 428 R Axis:   -31  Text Interpretation: Atrial fibrillation Left axis deviation When compared with ECG of 28-Sep-2023 09:04, QRS axis Shifted left Confirmed by Perla Lye [id number]) on 03/25/2024 8:37:59 AM   Past medical history reviewed periodic phlebotomy for hemochromatosis Also with some polycythemia secondary to  elevated iron stores  Seen by EP Dr. Fernande April 2021 Permanent atrial fibrillation, rate in the 80s Walking with a dog 2 to 3 days a week Dig level check  ECHO 11/24/2015: EF 45 to 50% Stress test : 12/21/2015 No ischemia, EF 42%  echo in 05/2015: EF 30 to 35%  outpatient left lithotripsy 08/28/2014  found to have atrial fibrillation with RVR with heart rates up to 170s prior to the procedure and this was canceled. He was kept overnight in the hospital and heart rate returned to normal on his regular medication regiment, metoprolol  succinate 100 mg twice a day   PMH:   has a past medical history of Chronic a-fib (HCC), Chronic systolic CHF (congestive heart failure) (HCC), Depression, Edema (09/26/2014), Hemochromatosis, History of kidney stones, History of pulmonary embolism, Hyperlipidemia, Hypertension, Migraine, Obesity, OSA (obstructive sleep apnea), Osteoarthritis, and [place] spotted fever (2011).  PSH:    Past Surgical History:  Procedure Laterality Date   CARDIAC CATHETERIZATION  2012   [facility]   KNEE SURGERY     right knee    LITHOTRIPSY  07/31/2014   URETERAL STENT PLACEMENT  2016   URETEROSCOPY      Current Outpatient Medications  Medication Sig Dispense Refill   acetaminophen (TYLENOL) 500 MG tablet Take 1,000 mg by mouth every 6 (six) hours as needed.     apixaban  (ELIQUIS ) 5 MG TABS tablet Take 1 tablet (5 mg total) by mouth 2 (two) times daily. 180 tablet 3   Cholecalciferol (VITAMIN D ) 2000 units CAPS daily.      digoxin  (LANOXIN )  0.25 MG tablet TAKE 1 TABLET DAILY 90 tablet 3   furosemide  (LASIX ) 20 MG tablet Take 1 tablet (20 mg total) by mouth daily as needed for edema. 90 tablet 3   loratadine (CLARITIN) 10 MG tablet Take 10 mg by mouth daily.     metoprolol  succinate (TOPROL -XL) 100 MG 24 hr tablet Take 1 tablet (100 mg total) by mouth in the morning and at bedtime. 180 tablet 3   Multiple Vitamins-Minerals (CENTRUM SILVER  ADULT 50+) TABS Take 1 tablet by  mouth daily.     pravastatin  (PRAVACHOL ) 80 MG tablet TAKE 1 TABLET DAILY 90 tablet 3   sacubitril-valsartan (ENTRESTO ) 24-26 MG Take 1 tablet by mouth 2 (two) times daily. (Patient taking differently: Take 1 tablet by mouth 2 (two) times daily. Pt takes 1/2 tab twice per day) 180 tablet 3   traMADol  (ULTRAM ) 50 MG tablet TAKE 1 TABLET(50 MG) BY MOUTH EVERY 6 HOURS AS NEEDED FOR PAIN 90 tablet 2   predniSONE  (DELTASONE ) 10 MG tablet TAKE 4 TABLETS PO QD FOR 3 DAYS THEN TAKE 3 TABLETS PO QD FOR 3 DAYS THEN TAKE 2 TABLET PO QD FOR 3 DAYS THEN TAKE 1 TAB PO QD FOR 3 DAYS. Start at the first sign of a gout flare. (Patient not taking: Reported on 03/25/2024) 30 tablet 1   No current facility-administered medications for this visit.     Allergies:   Allopurinol, Diltiazem , Chlorhexidine, Peridex [chlorhexidine gluconate], Ondansetron, and Penicillins   Social History:  The patient  reports that he has never smoked. He has never used smokeless tobacco. He reports that he does not drink alcohol and does not use drugs.   Family History:   family history includes Arthritis in his father; Cancer in his father and paternal grandfather; Hypertension in his father; Thyroid  disease in his mother.   Review of Systems: Review of Systems  Constitutional:        Weight gain  HENT: Negative.    Respiratory: Negative.    Cardiovascular: Negative.   Gastrointestinal: Negative.   Neurological: Negative.   Psychiatric/Behavioral: Negative.    All other systems reviewed and are negative.  PHYSICAL EXAM: VS:  BP 112/80 (BP Location: Left Arm, Patient Position: Sitting, Cuff Size: Large)   Pulse 87   Ht 5' 11 (1.803 m)   Wt (!) 331 lb 2 oz (150.2 kg)   SpO2 100%   BMI 46.18 kg/m  , BMI Body mass index is 46.18 kg/m. Constitutional:  oriented to person, place, and time. No distress.  HENT:  Head: Normocephalic and atraumatic.  Eyes:  no discharge. No scleral icterus.  Neck: Normal range of motion. Neck  supple. No JVD present.  Cardiovascular: Normal rate, regular rhythm, normal heart sounds and intact distal pulses. Exam reveals no gallop and no friction rub. No edema No murmur heard. Pulmonary/Chest: Effort normal and breath sounds normal. No stridor. No respiratory distress.  no wheezes.  no rales.  no tenderness.  Abdominal: Soft.  no distension.  no tenderness.  Musculoskeletal: Normal range of motion.  no  tenderness or deformity.  Neurological:  normal muscle tone. Coordination normal. No atrophy Skin: Skin is warm and dry. No rash noted. not diaphoretic.  Psychiatric:  normal mood and affect. behavior is normal. Thought content normal.   Recent Labs: 11/22/2023: BUN 17; Creatinine, Ser 0.99; Potassium 4.5; Sodium 138 11/30/2023: ALT 17 01/09/2024: Hemoglobin 17.0; Platelets 172    Lipid Panel Lab Results  Component Value Date   CHOL 124  11/22/2023   HDL 37.80 (L) 11/22/2023   LDLCALC 56 11/22/2023   TRIG 148.0 11/22/2023    Wt Readings from Last 3 Encounters:  03/25/24 (!) 331 lb 2 oz (150.2 kg)  11/21/23 (!) 305 lb 3.2 oz (138.4 kg)  11/16/23 290 lb (131.5 kg)     ASSESSMENT AND PLAN:  Permanent atrial fibrillation (HCC) Rate well-controlled, continue metoprolol  succinate 100 twice daily  Essential hypertension Tolerating metoprolol  succinate 100 twice daily and Entresto  half dose twice daily (half of the 24/26)   Diastolic CHF Prior ejection fraction 50 to 55% Appears euvolemic,  Weight higher but feels this is from dietary indiscretion  Continue Entresto , metoprolol  Lasix  as needed  Pure hypercholesterolemia Cholesterol is at goal on the current lipid regimen. No changes to the medications were made. Reports research trial is over, was on triglyceride drug  Morbid obesity (HCC) Weight trended back up, 290 now 330 Encouraged continued exercise program and diet restriction  Anxiety Needs Xanax  before procedures  Obstructive sleep apnea on CPAP   compliant on his CPAP  Chronic diastolic congestive heart failure (HCC) EF 50 to 55% On Entresto  and metoprolol  succinate,  Lasix  as needed (rare) Blood pressure stable, denies orthostasis symptoms    Signed, Velinda Lunger, M.D., Ph.D. 03/25/2024  [facility] [place], [place] [phone]

## 2024-03-25 ENCOUNTER — Ambulatory Visit: Payer: Medicare (Managed Care) | Attending: Cardiovascular Disease | Admitting: Cardiovascular Disease

## 2024-03-25 ENCOUNTER — Encounter: Payer: Self-pay | Admitting: Cardiovascular Disease

## 2024-03-25 VITALS — BP 112/80 | HR 87 | Ht 71.0 in | Wt 331.1 lb

## 2024-03-25 DIAGNOSIS — I10 Essential (primary) hypertension: Secondary | ICD-10-CM

## 2024-03-25 DIAGNOSIS — I4821 Permanent atrial fibrillation: Secondary | ICD-10-CM

## 2024-03-25 DIAGNOSIS — I4891 Unspecified atrial fibrillation: Secondary | ICD-10-CM | POA: Diagnosis not present

## 2024-03-25 DIAGNOSIS — I5032 Chronic diastolic (congestive) heart failure: Secondary | ICD-10-CM | POA: Diagnosis not present

## 2024-03-25 DIAGNOSIS — I428 Other cardiomyopathies: Secondary | ICD-10-CM

## 2024-03-25 DIAGNOSIS — G4733 Obstructive sleep apnea (adult) (pediatric): Secondary | ICD-10-CM

## 2024-03-25 DIAGNOSIS — E782 Mixed hyperlipidemia: Secondary | ICD-10-CM

## 2024-03-25 MED ORDER — SACUBITRIL-VALSARTAN 24-26 MG PO TABS: 1.0000 | ORAL_TABLET | Freq: Every day | ORAL | 3 refills | Status: DC

## 2024-03-25 MED ORDER — FUROSEMIDE 20 MG PO TABS: 20.0000 mg | ORAL_TABLET | Freq: Every day | ORAL | 3 refills | Status: AC | PRN

## 2024-03-25 MED ORDER — APIXABAN 5 MG PO TABS: 5.0000 mg | ORAL_TABLET | Freq: Two times a day (BID) | ORAL | 3 refills | Status: AC

## 2024-03-25 MED ORDER — PRAVASTATIN SODIUM 80 MG PO TABS: 80.0000 mg | ORAL_TABLET | Freq: Every day | ORAL | 3 refills | Status: DC

## 2024-03-25 MED ORDER — METOPROLOL SUCCINATE ER 100 MG PO TB24: 100.0000 mg | ORAL_TABLET | Freq: Two times a day (BID) | ORAL | 3 refills | Status: AC

## 2024-03-25 MED ORDER — DIGOXIN 250 MCG PO TABS: 250.0000 ug | ORAL_TABLET | Freq: Every day | ORAL | 3 refills | Status: DC

## 2024-03-25 NOTE — Patient Instructions (Signed)

## 2024-03-29 ENCOUNTER — Other Ambulatory Visit: Payer: Self-pay | Admitting: Family

## 2024-03-29 ENCOUNTER — Encounter: Payer: Self-pay | Admitting: Nurse Practitioner

## 2024-03-29 MED ORDER — METHYLPREDNISOLONE 4 MG PO TBPK: ORAL_TABLET | ORAL | 0 refills | Status: DC

## 2024-04-02 ENCOUNTER — Ambulatory Visit (INDEPENDENT_AMBULATORY_CARE_PROVIDER_SITE_OTHER): Payer: Medicare (Managed Care) | Admitting: Sleep Medicine

## 2024-04-02 ENCOUNTER — Encounter: Payer: Self-pay | Admitting: Sleep Medicine

## 2024-04-02 VITALS — BP 120/80 | HR 87 | Temp 98.9°F | Ht 71.0 in | Wt 333.0 lb

## 2024-04-02 DIAGNOSIS — G4733 Obstructive sleep apnea (adult) (pediatric): Secondary | ICD-10-CM | POA: Diagnosis not present

## 2024-04-02 DIAGNOSIS — I10 Essential (primary) hypertension: Secondary | ICD-10-CM | POA: Diagnosis not present

## 2024-04-02 DIAGNOSIS — Z6841 Body Mass Index (BMI) 40.0 and over, adult: Secondary | ICD-10-CM | POA: Diagnosis not present

## 2024-04-02 NOTE — Patient Instructions (Addendum)

## 2024-04-02 NOTE — Progress Notes (Signed)
 Name:Carlos Crosby MRN: [id number] DOB: April 22, 1956   CHIEF COMPLAINT:  ESTABLISH CARE FOR OSA   HISTORY OF PRESENT ILLNESS: Carlos Crosby is a 68 y.o. w/ a h/o OSA, atrial fibrillation, CHF, HTN and morbid obesity who presents to establish care for OSA. Reports that he was diagnosed with OSA several years ago and was subsequently started on CPAP therapy. Reports using CPAP therapy every night, which is confirmed by compliance data. He is currently using the Airfit F20 FFM. Reports feeling refreshed upon awakening with CPAP therapy. Denies any issues at this time. Reports nocturnal awakenings due to his dogs.    Bedtime 10-11 pm Sleep onset 10 mins Rise time 5-6 am   EPWORTH SLEEP SCORE 0    04/02/2024    9:00 AM  Results of the Epworth flowsheet  Sitting and reading 0  Watching TV 0  Sitting, inactive in a public place (e.g. a theatre or a meeting) 0  As a passenger in a car for an hour without a break 0  Lying down to rest in the afternoon when circumstances permit 0  Sitting and talking to someone 0  Sitting quietly after a lunch without alcohol 0  In a car, while stopped for a few minutes in traffic 0  Total score 0    PAST MEDICAL HISTORY :   has a past medical history of Allergy, Chronic a-fib (HCC), Chronic systolic CHF (congestive heart failure) (HCC), Depression, Edema (09/26/2014), Hemochromatosis, History of kidney stones, History of pulmonary embolism, Hyperlipidemia, Hypertension, Migraine, Obesity, OSA (obstructive sleep apnea), Osteoarthritis, [place] spotted fever (2011), and Sleep apnea (09/15/2010).  has a past surgical history that includes Lithotripsy (07/31/2014); Knee surgery; Ureteroscopy; Ureteral stent placement (2016); and Cardiac catheterization (2012). Prior to Admission medications   Medication Sig Start Date End Date Taking? Authorizing Provider  triamcinolone cream (KENALOG) 0.1 % Apply 1 Application topically 2 (two) times daily. 02/07/24   Yes [provider]  acetaminophen (TYLENOL) 500 MG tablet Take 1,000 mg by mouth every 6 (six) hours as needed.    [provider]  apixaban  (ELIQUIS ) 5 MG TABS tablet Take 1 tablet (5 mg total) by mouth 2 (two) times daily. 03/25/24 03/25/25  Gollan, Timothy J, MD  Cholecalciferol (VITAMIN D ) 2000 units CAPS daily.     [provider]  digoxin  (LANOXIN ) 0.25 MG tablet Take 1 tablet (250 mcg total) by mouth daily. 03/25/24   Gollan, Timothy J, MD  furosemide  (LASIX ) 20 MG tablet Take 1 tablet (20 mg total) by mouth daily as needed for edema. 03/25/24   Gollan, Timothy J, MD  loratadine (CLARITIN) 10 MG tablet Take 10 mg by mouth daily.    [provider]  methylPREDNISolone  (MEDROL  DOSEPAK) 4 MG TBPK tablet As directed 03/29/24   Webb, Padonda B, FNP  metoprolol  succinate (TOPROL -XL) 100 MG 24 hr tablet Take 1 tablet (100 mg total) by mouth in the morning and at bedtime. 03/25/24   Gollan, Timothy J, MD  Multiple Vitamins-Minerals (CENTRUM SILVER  ADULT 50+) TABS Take 1 tablet by mouth daily. 09/04/14   [provider]  pravastatin  (PRAVACHOL ) 80 MG tablet Take 1 tablet (80 mg total) by mouth daily. 03/25/24   Gollan, Timothy J, MD  predniSONE  (DELTASONE ) 10 MG tablet TAKE 4 TABLETS PO QD FOR 3 DAYS THEN TAKE 3 TABLETS PO QD FOR 3 DAYS THEN TAKE 2 TABLET PO QD FOR 3 DAYS THEN TAKE 1 TAB PO QD FOR 3 DAYS. Start  at the first sign of a gout flare. Patient not taking: Reported on 03/25/2024 12/26/23   Gretel App, NP  sacubitril-valsartan (ENTRESTO ) 24-26 MG Take 1 tablet by mouth daily. 03/25/24   Gollan, Timothy J, MD  traMADol  (ULTRAM ) 50 MG tablet TAKE 1 TABLET(50 MG) BY MOUTH EVERY 6 HOURS AS NEEDED FOR PAIN 03/06/24   Gretel App, NP   Allergies  Allergen Reactions   Allopurinol Hives and Swelling   Diltiazem  Itching and Rash   Chlorhexidine      lump in throat    Peridex [Chlorhexidine Gluconate]     Mouth swelling  Blisters    Ondansetron Rash    Penicillins Other (See Comments) and Nausea Only    Pt unsure of rxn; showed up on allergy test Pt unsure of rxn; showed up on allergy test Pt unsure of rxn; showed up on allergy test And dirrhea     FAMILY HISTORY:  family history includes Arthritis in his father; Cancer in his father and paternal grandfather; Early death in his mother; Hearing loss in his maternal grandfather; Hypertension in his father; Miscarriages / Stillbirths in his mother; Thyroid  disease in his mother; Varicose Veins in his mother; Vision loss in his mother. SOCIAL HISTORY:  reports that he has never smoked. He has never used smokeless tobacco. He reports that he does not drink alcohol and does not use drugs.   Review of Systems:  Gen:  Denies  fever, sweats, chills weight loss  HEENT: Denies blurred vision, double vision, ear pain, eye pain, hearing loss, nose bleeds, sore throat Cardiac:  No dizziness, chest pain or heaviness, chest tightness,edema, No JVD Resp:   No cough, -sputum production, -shortness of breath,-wheezing, -hemoptysis,  Gi: Denies swallowing difficulty, stomach pain, nausea or vomiting, diarrhea, constipation, bowel incontinence Gu:  Denies bladder incontinence, burning urine Ext:   Denies Joint pain, stiffness or swelling Skin: Denies  skin rash, easy bruising or bleeding or hives Endoc:  Denies polyuria, polydipsia , polyphagia or weight change Psych:   Denies depression, insomnia or hallucinations  Other:  All other systems negative  VITAL SIGNS: BP 120/80   Pulse 87   Temp 98.9 F (37.2 C)   Ht 5' 11 (1.803 m)   Wt (!) 333 lb (151 kg)   SpO2 97%   BMI 46.44 kg/m    Physical Examination:   General Appearance: No distress  EYES PERRLA, EOM intact.   NECK Supple, No JVD Pulmonary: normal breath sounds, No wheezing.  CardiovascularNormal S1,S2.  No m/r/g.   Abdomen: Benign, Soft, non-tender. Skin:   warm, no rashes, no ecchymosis  Extremities: normal, no cyanosis,  clubbing. Neuro:without focal findings,  speech normal  PSYCHIATRIC: Mood, affect within normal limits.   ASSESSMENT AND PLAN  OSA Patient is using and benefiting from CPAP therapy. Discussed the consequences of untreated sleep apnea. Advised not to drive drowsy for safety of patient and others. Will follow up in 1 year.    HTN Stable, on current management. Following with PCP.   Morbid obesity Counseled patient on diet and lifestyle modification.    Patient  satisfied with Plan of action and management. All questions answered  I spent a total of 47 minutes reviewing chart data, face-to-face evaluation with the patient, counseling and coordination of care as detailed above.    Orson Rho, M.D.  Sleep Medicine [facility]

## 2024-04-04 DIAGNOSIS — D225 Melanocytic nevi of trunk: Secondary | ICD-10-CM | POA: Diagnosis not present

## 2024-04-04 DIAGNOSIS — D2272 Melanocytic nevi of left lower limb, including hip: Secondary | ICD-10-CM | POA: Diagnosis not present

## 2024-04-04 DIAGNOSIS — D2261 Melanocytic nevi of right upper limb, including shoulder: Secondary | ICD-10-CM | POA: Diagnosis not present

## 2024-04-04 DIAGNOSIS — D2262 Melanocytic nevi of left upper limb, including shoulder: Secondary | ICD-10-CM | POA: Diagnosis not present

## 2024-04-04 DIAGNOSIS — D2271 Melanocytic nevi of right lower limb, including hip: Secondary | ICD-10-CM | POA: Diagnosis not present

## 2024-04-04 DIAGNOSIS — L821 Other seborrheic keratosis: Secondary | ICD-10-CM | POA: Diagnosis not present

## 2024-04-04 DIAGNOSIS — L570 Actinic keratosis: Secondary | ICD-10-CM | POA: Diagnosis not present

## 2024-04-04 NOTE — Telephone Encounter (Signed)
 As far as I know the patient will need to sign a release either with us  to request or with them to forward sleep study to us 

## 2024-04-08 ENCOUNTER — Encounter: Payer: Self-pay | Admitting: Radiology

## 2024-04-16 ENCOUNTER — Encounter: Payer: Self-pay | Admitting: Nurse Practitioner

## 2024-04-19 ENCOUNTER — Other Ambulatory Visit: Payer: Self-pay | Admitting: Nurse Practitioner

## 2024-04-19 DIAGNOSIS — M1A9XX0 Chronic gout, unspecified, without tophus (tophi): Secondary | ICD-10-CM

## 2024-04-19 DIAGNOSIS — Z8739 Personal history of other diseases of the musculoskeletal system and connective tissue: Secondary | ICD-10-CM

## 2024-04-19 MED ORDER — PREDNISONE 10 MG PO TABS: ORAL_TABLET | ORAL | 1 refills | Status: DC

## 2024-04-22 ENCOUNTER — Other Ambulatory Visit: Payer: Self-pay | Admitting: Cardiovascular Disease

## 2024-04-26 ENCOUNTER — Other Ambulatory Visit: Payer: Self-pay | Admitting: Cardiovascular Disease

## 2024-04-26 MED ORDER — PRAVASTATIN SODIUM 80 MG PO TABS: 80.0000 mg | ORAL_TABLET | Freq: Every day | ORAL | 3 refills | Status: AC

## 2024-05-10 ENCOUNTER — Encounter: Payer: Self-pay | Admitting: Cardiovascular Disease

## 2024-05-13 ENCOUNTER — Other Ambulatory Visit: Payer: Self-pay | Admitting: Cardiovascular Disease

## 2024-05-22 ENCOUNTER — Encounter: Payer: Self-pay | Admitting: Nurse Practitioner

## 2024-05-22 ENCOUNTER — Ambulatory Visit: Payer: Medicare (Managed Care) | Admitting: Nurse Practitioner

## 2024-05-22 VITALS — BP 120/84 | HR 74 | Temp 98.1°F | Ht 71.0 in | Wt 310.6 lb

## 2024-05-22 DIAGNOSIS — I5032 Chronic diastolic (congestive) heart failure: Secondary | ICD-10-CM

## 2024-05-22 DIAGNOSIS — I10 Essential (primary) hypertension: Secondary | ICD-10-CM | POA: Diagnosis not present

## 2024-05-22 DIAGNOSIS — G4733 Obstructive sleep apnea (adult) (pediatric): Secondary | ICD-10-CM

## 2024-05-22 DIAGNOSIS — I4821 Permanent atrial fibrillation: Secondary | ICD-10-CM

## 2024-05-22 DIAGNOSIS — Z23 Encounter for immunization: Secondary | ICD-10-CM

## 2024-05-22 DIAGNOSIS — E782 Mixed hyperlipidemia: Secondary | ICD-10-CM | POA: Diagnosis not present

## 2024-05-22 DIAGNOSIS — M1A9XX0 Chronic gout, unspecified, without tophus (tophi): Secondary | ICD-10-CM | POA: Diagnosis not present

## 2024-05-22 NOTE — Assessment & Plan Note (Signed)
 Euvolemic. Continue Digoxin, Entresto and Laxis as needed. Follow up with Cardiology as scheduled.

## 2024-05-22 NOTE — Progress Notes (Signed)
 Leron Glance, NP-C Phone: [phone]  Carlos [place] is a 68 y.o. male who presents today for follow up.   Discussed the use of AI scribe software for clinical note transcription with the patient, who gave verbal consent to proceed.  History of Present Illness   Carlos [place] is a 68 year old male with gout who presents for follow up with recurrent gout attacks.  He experiences gout attacks every six to eight weeks, which is more frequent than usual. He suspects bread as a trigger, as attacks occur after consuming it. Switching to gluten-free products seems to help, although he is unsure about a gluten allergy. Despite dietary changes, his right ankle remains swollen, and his toe is slightly red but not swollen.  His first gout attack occurred about thirty years ago after consuming beer, which he no longer drinks. He has identified other triggers, such as ice cream, which he avoids. He is awaiting a rheumatology appointment in March for further evaluation.  He is allergic to allopurinol, which causes severe skin reactions. He notes that his uric acid levels are not typically elevated during attacks. His gout symptoms are localized to one side of his body, similar to past experiences with kidney stones and skin tags.  He has a history of atrial fibrillation and congestive heart failure, for which he takes metoprolol , Entresto , Lasix  as needed, digoxin , and Eliquis . He mentions weight fluctuations, attributing some weight gain to prednisone  use and metoprolol . He has lost weight recently, dropping from 333 pounds to 310 pounds.  He has sleep apnea and uses a CPAP machine nightly, although he experiences discomfort from the mask.     Tobacco Use History[1]  Medications Ordered Prior to Encounter[2]   ROS see history of present illness  Objective  Physical Exam Vitals:   05/22/24 0817  BP: 120/84  Pulse: 74  Temp: 98.1 F (36.7 C)  SpO2: 98%    BP Readings from Last 3  Encounters:  05/22/24 120/84  04/02/24 120/80  03/25/24 112/80   Wt Readings from Last 3 Encounters:  05/22/24 (!) 310 lb 9.6 oz (140.9 kg)  04/02/24 (!) 333 lb (151 kg)  03/25/24 (!) 331 lb 2 oz (150.2 kg)    Physical Exam Constitutional:      General: He is not in acute distress.    Appearance: Normal appearance.  HENT:     Head: Normocephalic.  Cardiovascular:     Rate and Rhythm: Normal rate. Rhythm irregularly irregular.     Heart sounds: Normal heart sounds.  Pulmonary:     Effort: Pulmonary effort is normal.     Breath sounds: Normal breath sounds.  Skin:    General: Skin is warm and dry.  Neurological:     General: No focal deficit present.     Mental Status: He is alert.  Psychiatric:        Mood and Affect: Mood normal.        Behavior: Behavior normal.      Assessment/Plan: Please see individual problem list.  Chronic gout involving toe of right foot without tophus, unspecified cause Assessment & Plan: He experiences recurrent gout attacks every 6-8 weeks, mainly affecting the right ankle and toe, with symptoms of swelling and redness. Bread may trigger these episodes, but symptoms improve on a gluten-free diet. He is allergic to allopurinol and is awaiting a rheumatology consultation in March. Continue the gluten-free diet and avoid triggers. Follow up with rheumatology in March.   Permanent atrial fibrillation (  HCC) Assessment & Plan: Chronic atrial fibrillation is managed with Eliquis  and regular cardiology follow-up. Continue current medications: Eliquis , digoxin , toprol , and Entresto . Follow up with Cardiology as scheduled.    Essential hypertension Assessment & Plan: Blood pressure is well controlled on current medication regimen. Continue Metoprolol  and Entresto  as prescribed.    Chronic diastolic congestive heart failure (HCC) Assessment & Plan: Euvolemic. Continue Digoxin , Entresto  and Laxis as needed. Follow up with Cardiology as scheduled.     Obstructive sleep apnea on CPAP Assessment & Plan: He manages obstructive sleep apnea with CPAP therapy and reports compliance, though he experiences neck discomfort from mask tightness. Continue CPAP therapy nightly and adjust the CPAP mask to alleviate neck discomfort.    Mixed hyperlipidemia Assessment & Plan: Currently on pravastatin  with no new muscle aches. Continue pravastatin  80 mg daily.   Morbid (severe) obesity due to excess calories (HCC) Assessment & Plan: Recent weight loss due to dietary modifications. Continue. Encourage healthy diet and regular exercise.    Need for influenza vaccination -     Flu vaccine HIGH DOSE PF(Fluzone Trivalent)     Return in about 6 months (around 11/20/2024) for Follow up.   Leron Glance, NP-C [facility] [place]      [1]  Social History Tobacco Use  Smoking Status Never  Smokeless Tobacco Never  [2]  Current Outpatient Medications on File Prior to Visit  Medication Sig Dispense Refill   acetaminophen (TYLENOL) 500 MG tablet Take 1,000 mg by mouth every 6 (six) hours as needed.     apixaban  (ELIQUIS ) 5 MG TABS tablet Take 1 tablet (5 mg total) by mouth 2 (two) times daily. 180 tablet 3   Cholecalciferol (VITAMIN D ) 2000 units CAPS daily.      digoxin  (LANOXIN ) 0.25 MG tablet TAKE 1 TABLET DAILY 90 tablet 3   furosemide  (LASIX ) 20 MG tablet Take 1 tablet (20 mg total) by mouth daily as needed for edema. 90 tablet 3   loratadine (CLARITIN) 10 MG tablet Take 10 mg by mouth daily.     metoprolol  succinate (TOPROL -XL) 100 MG 24 hr tablet Take 1 tablet (100 mg total) by mouth in the morning and at bedtime. 180 tablet 3   Multiple Vitamins-Minerals (CENTRUM SILVER  ADULT 50+) TABS Take 1 tablet by mouth daily.     pravastatin  (PRAVACHOL ) 80 MG tablet Take 1 tablet (80 mg total) by mouth daily. 90 tablet 3   sacubitril -valsartan  (ENTRESTO ) 24-26 MG TAKE 1 TABLET BY MOUTH TWICE DAILY 180 tablet 3   traMADol   (ULTRAM ) 50 MG tablet TAKE 1 TABLET(50 MG) BY MOUTH EVERY 6 HOURS AS NEEDED FOR PAIN 90 tablet 2   triamcinolone cream (KENALOG) 0.1 % Apply 1 Application topically 2 (two) times daily.     No current facility-administered medications on file prior to visit.

## 2024-05-22 NOTE — Assessment & Plan Note (Signed)
 Blood pressure is well controlled on current medication regimen. Continue Metoprolol  and Entresto  as prescribed.

## 2024-05-22 NOTE — Assessment & Plan Note (Signed)
 Currently on pravastatin  with no new muscle aches. Continue pravastatin  80 mg daily.

## 2024-05-22 NOTE — Assessment & Plan Note (Signed)
 Chronic atrial fibrillation is managed with Eliquis  and regular cardiology follow-up. Continue current medications: Eliquis , digoxin , toprol , and Entresto . Follow up with Cardiology as scheduled.

## 2024-05-22 NOTE — Assessment & Plan Note (Signed)
 He manages obstructive sleep apnea with CPAP therapy and reports compliance, though he experiences neck discomfort from mask tightness. Continue CPAP therapy nightly and adjust the CPAP mask to alleviate neck discomfort.

## 2024-05-22 NOTE — Assessment & Plan Note (Signed)
 He experiences recurrent gout attacks every 6-8 weeks, mainly affecting the right ankle and toe, with symptoms of swelling and redness. Bread may trigger these episodes, but symptoms improve on a gluten-free diet. He is allergic to allopurinol and is awaiting a rheumatology consultation in March. Continue the gluten-free diet and avoid triggers. Follow up with rheumatology in March.

## 2024-05-22 NOTE — Assessment & Plan Note (Signed)
 Recent weight loss due to dietary modifications. Continue. Encourage healthy diet and regular exercise.

## 2024-05-26 DIAGNOSIS — G4730 Sleep apnea, unspecified: Secondary | ICD-10-CM | POA: Diagnosis not present

## 2024-05-26 DIAGNOSIS — G4733 Obstructive sleep apnea (adult) (pediatric): Secondary | ICD-10-CM | POA: Diagnosis not present

## 2024-05-27 ENCOUNTER — Encounter: Payer: Self-pay | Admitting: Nurse Practitioner

## 2024-05-27 ENCOUNTER — Encounter: Payer: Self-pay | Admitting: Oncology

## 2024-05-27 ENCOUNTER — Encounter: Payer: Self-pay | Admitting: Cardiovascular Disease

## 2024-06-17 ENCOUNTER — Other Ambulatory Visit: Payer: Self-pay | Admitting: Nurse Practitioner

## 2024-06-17 DIAGNOSIS — M170 Bilateral primary osteoarthritis of knee: Secondary | ICD-10-CM

## 2024-06-18 ENCOUNTER — Encounter: Payer: Self-pay | Admitting: Nurse Practitioner

## 2024-06-18 DIAGNOSIS — M170 Bilateral primary osteoarthritis of knee: Secondary | ICD-10-CM

## 2024-06-19 MED ORDER — TRAMADOL HCL 50 MG PO TABS: ORAL_TABLET | ORAL | 2 refills | Status: AC

## 2024-06-26 ENCOUNTER — Other Ambulatory Visit: Payer: Self-pay

## 2024-06-26 DIAGNOSIS — M170 Bilateral primary osteoarthritis of knee: Secondary | ICD-10-CM

## 2024-07-05 ENCOUNTER — Ambulatory Visit: Payer: Medicare (Managed Care) | Admitting: Physician Assistant

## 2024-07-12 ENCOUNTER — Ambulatory Visit: Payer: Medicare (Managed Care) | Admitting: Physician Assistant

## 2024-07-12 ENCOUNTER — Other Ambulatory Visit: Payer: Self-pay

## 2024-07-12 DIAGNOSIS — M25572 Pain in left ankle and joints of left foot: Secondary | ICD-10-CM

## 2024-07-12 DIAGNOSIS — M170 Bilateral primary osteoarthritis of knee: Secondary | ICD-10-CM

## 2024-07-12 MED ORDER — SODIUM HYALURONATE 60 MG/3ML IX PRSY
60.0000 mg | PREFILLED_SYRINGE | INTRA_ARTICULAR | Status: AC | PRN
  Administered 2024-07-12: 60 mg via INTRA_ARTICULAR

## 2024-07-12 NOTE — Progress Notes (Signed)
 "  Office Visit Note   Patient: Carlos [place]           Date of Birth: 24-Mar-1956           MRN: [id number] Visit Date: 07/12/2024              Requested by: Gretel App, NP [address] [place],  [place] [address] PCP: Gretel App, NP  No chief complaint on file.     HPI: Patient is a former patient of Dr. Anderson who I see periodically for viscosupplementation into his knees.  Comes in today no new injuries.  Injections helped him somewhat.  He has severe advanced degenerative changes in both of his knees.  Also has been having trouble with his right ankle he thinks he had an injury that was treated by Dr. Anderson with a cast many years ago.  Could not access this information as it was +20 years ago.  He denies any fever or chills.  He also has a history of gout but he does not think this is gout in which normally appears in his great toe joint  Assessment & Plan: Visit Diagnoses:  1. Pain in left ankle and joints of left foot   2. Primary osteoarthritis of both knees     Plan: Went forward with bilateral Durolane injections today.  He has advanced degenerative changes of both his tibiotalar and subtalar joint.  I talked him about having a consultation with Dr. Harden.  Could consider injections given the tightness of his ankle joint and subtalar joint this may need to be done under ultrasound guidance.  Follow-Up Instructions: As needed  Ortho Exam  Patient is alert, oriented, no adenopathy, well-dressed, normal affect, normal respiratory effort. Examination bilateral knees no effusion no erythema well-healed surgical incision on the right knee.  Right ankle he has palpable dorsalis pedis pulse.  He has stiffness with both dorsiflexion plantarflexion eversion inversion.  No ascending cellulitis.    Imaging: No results found. No images are attached to the encounter.  Labs: Lab Results  Component Value Date   HGBA1C 5.4 11/22/2023   HGBA1C 5.7 04/24/2023    HGBA1C 5.4 07/08/2022   LABURIC 7.2 03/17/2023   LABURIC 6.8 03/29/2021   LABURIC 7.8 07/11/2017     Lab Results  Component Value Date   ALBUMIN 4.3 11/30/2023   ALBUMIN 4.4 11/22/2023   ALBUMIN 4.1 05/15/2023    No results found for: MG Lab Results  Component Value Date   VD25OH 39.87 04/24/2023   VD25OH 43.76 05/18/2018   VD25OH 32.32 01/15/2018    No results found for: PREALBUMIN    Latest Ref Rng & Units 01/09/2024    3:01 PM 09/11/2023    1:08 PM 05/15/2023   12:31 PM  CBC EXTENDED  WBC 4.0 - 10.5 K/uL 7.1  7.4  7.7   RBC 4.22 - 5.81 MIL/uL 4.91  5.60  5.16   Hemoglobin 13.0 - 17.0 g/dL 82.9  81.8  82.6   HCT 39.0 - 52.0 % 48.4  53.7  49.9   Platelets 150 - 400 K/uL 172  199  171   NEUT# 1.7 - 7.7 K/uL 3.3  4.0  3.7   Lymph# 0.7 - 4.0 K/uL 2.9  2.5  3.0      There is no height or weight on file to calculate BMI.  Orders:  Orders Placed This Encounter  Procedures   XR Ankle Complete Right   Ambulatory referral to Orthopedic  Surgery   No orders of the defined types were placed in this encounter.    Procedures: Large Joint Inj: bilateral knee on 07/12/2024 10:56 AM Indications: pain and diagnostic evaluation Details: 1.5 in  Arthrogram: No  Medications (Right): 60 mg Sodium Hyaluronate 60 MG/3ML Medications (Left): 60 mg Sodium Hyaluronate 60 MG/3ML Outcome: tolerated well, no immediate complications Procedure, treatment alternatives, risks and benefits explained, specific risks discussed. Consent was given by the patient.     Clinical Data: No additional findings.  ROS:  All other systems negative, except as noted in the HPI. Review of Systems  Objective: Vital Signs: There were no vitals taken for this visit.  Specialty Comments:  No specialty comments available.  PMFS History: Patient Active Problem List   Diagnosis Date Noted   Viral gastroenteritis 08/01/2023   Acute gout of left ankle 03/17/2023   Actinic keratoses  10/17/2022   Elevated MCV 10/19/2021   Chronic gout involving toe of right foot without tophus 03/29/2021   Morbid (severe) obesity due to excess calories (HCC) 09/29/2020   Trigger finger, left ring finger 05/07/2020   Rash 10/08/2019   Fall 02/05/2019   Neuropathy 11/16/2017   Prediabetes 11/16/2017   Vitamin D  deficiency 11/16/2017   Hemochromatosis, hereditary 07/23/2017   Allergic rhinitis 01/26/2017   Blue nevus 05/16/2016   Hair loss 05/16/2016   Abdominal pain 05/16/2016   Umbilical hernia without obstruction and without gangrene 05/16/2016   Atrial fibrillation (HCC) 09/26/2014   Chronic diastolic congestive heart failure (HCC) 09/26/2014   Anxiety 09/26/2014   Obstructive sleep apnea on CPAP 09/26/2014   Essential hypertension 09/26/2014   Hyperlipidemia 09/26/2014   Osteoarthritis of both knees 09/26/2014   Kidney stone 09/26/2014   Past Medical History:  Diagnosis Date   Allergy    Chronic a-fib (HCC)    Chronic systolic CHF (congestive heart failure) (HCC)    Depression    Edema 09/26/2014   Hemochromatosis    History of kidney stones    History of pulmonary embolism    Hyperlipidemia    Hypertension    Migraine    Obesity    OSA (obstructive sleep apnea)    on CPAP   Osteoarthritis    bilateral knees   [facility] fever [facility]   Sleep apnea 09/15/2010    Family History  Problem Relation Age of Onset   Thyroid  disease Mother    Early death Mother    Miscarriages / Stillbirths Mother    Vision loss Mother    Varicose Veins Mother    Cancer Father        lung   Arthritis Father    Hypertension Father    Cancer Paternal Grandfather        Throat cancer   Hearing loss Maternal Grandfather     Past Surgical History:  Procedure Laterality Date   CARDIAC CATHETERIZATION  2012   [facility]   KNEE SURGERY     right knee    LITHOTRIPSY  07/31/2014   URETERAL STENT PLACEMENT  2016    URETEROSCOPY     Social History   Occupational History   Not on file  Tobacco Use   Smoking status: Never   Smokeless tobacco: Never  Vaping Use   Vaping status: Never Used  Substance and Sexual Activity   Alcohol use: No    Comment: occasional; 3 times a year   Drug use: No   Sexual activity: Not Currently       "

## 2024-07-18 ENCOUNTER — Encounter: Payer: Medicare (Managed Care) | Admitting: Orthopedic Surgery

## 2024-07-22 ENCOUNTER — Other Ambulatory Visit: Payer: Medicare (Managed Care)

## 2024-07-22 ENCOUNTER — Telehealth: Payer: Medicare (Managed Care) | Admitting: Oncology

## 2024-07-26 ENCOUNTER — Inpatient Hospital Stay: Payer: Medicare (Managed Care)

## 2024-07-26 ENCOUNTER — Inpatient Hospital Stay: Payer: Medicare (Managed Care) | Admitting: Oncology

## 2024-08-06 ENCOUNTER — Ambulatory Visit: Payer: Medicare (Managed Care)

## 2024-08-19 ENCOUNTER — Ambulatory Visit: Payer: Medicare (Managed Care)

## 2024-11-22 ENCOUNTER — Ambulatory Visit: Payer: Medicare (Managed Care) | Admitting: Nurse Practitioner
